# Patient Record
Sex: Female | Born: 1950 | Race: White | Hispanic: No | Marital: Married | Smoking: Former smoker
Health system: Southern US, Community
[De-identification: ages and names within clinical notes are randomized; demographics above are authoritative.]

## PROBLEM LIST (undated history)

## (undated) DIAGNOSIS — I779 Disorder of arteries and arterioles, unspecified: Secondary | ICD-10-CM

## (undated) DIAGNOSIS — K589 Irritable bowel syndrome without diarrhea: Secondary | ICD-10-CM

## (undated) DIAGNOSIS — T4145XA Adverse effect of unspecified anesthetic, initial encounter: Secondary | ICD-10-CM

## (undated) DIAGNOSIS — R159 Full incontinence of feces: Secondary | ICD-10-CM

## (undated) DIAGNOSIS — Z87828 Personal history of other (healed) physical injury and trauma: Secondary | ICD-10-CM

## (undated) DIAGNOSIS — I4510 Unspecified right bundle-branch block: Secondary | ICD-10-CM

## (undated) DIAGNOSIS — M1990 Unspecified osteoarthritis, unspecified site: Secondary | ICD-10-CM

## (undated) DIAGNOSIS — I471 Supraventricular tachycardia, unspecified: Secondary | ICD-10-CM

## (undated) DIAGNOSIS — E785 Hyperlipidemia, unspecified: Secondary | ICD-10-CM

## (undated) DIAGNOSIS — R7303 Prediabetes: Secondary | ICD-10-CM

## (undated) DIAGNOSIS — M5137 Other intervertebral disc degeneration, lumbosacral region: Secondary | ICD-10-CM

## (undated) DIAGNOSIS — F419 Anxiety disorder, unspecified: Secondary | ICD-10-CM

## (undated) DIAGNOSIS — G8929 Other chronic pain: Secondary | ICD-10-CM

## (undated) DIAGNOSIS — Z789 Other specified health status: Secondary | ICD-10-CM

## (undated) DIAGNOSIS — R112 Nausea with vomiting, unspecified: Secondary | ICD-10-CM

## (undated) DIAGNOSIS — Z87898 Personal history of other specified conditions: Secondary | ICD-10-CM

## (undated) DIAGNOSIS — Z8709 Personal history of other diseases of the respiratory system: Secondary | ICD-10-CM

## (undated) DIAGNOSIS — T8859XA Other complications of anesthesia, initial encounter: Secondary | ICD-10-CM

## (undated) DIAGNOSIS — J439 Emphysema, unspecified: Secondary | ICD-10-CM

## (undated) DIAGNOSIS — R011 Cardiac murmur, unspecified: Secondary | ICD-10-CM

## (undated) DIAGNOSIS — M549 Dorsalgia, unspecified: Secondary | ICD-10-CM

## (undated) DIAGNOSIS — I341 Nonrheumatic mitral (valve) prolapse: Secondary | ICD-10-CM

## (undated) DIAGNOSIS — M5030 Other cervical disc degeneration, unspecified cervical region: Secondary | ICD-10-CM

## (undated) DIAGNOSIS — Z9889 Other specified postprocedural states: Secondary | ICD-10-CM

## (undated) DIAGNOSIS — I739 Peripheral vascular disease, unspecified: Secondary | ICD-10-CM

## (undated) DIAGNOSIS — K56609 Unspecified intestinal obstruction, unspecified as to partial versus complete obstruction: Secondary | ICD-10-CM

## (undated) DIAGNOSIS — M51379 Other intervertebral disc degeneration, lumbosacral region without mention of lumbar back pain or lower extremity pain: Secondary | ICD-10-CM

## (undated) DIAGNOSIS — F329 Major depressive disorder, single episode, unspecified: Secondary | ICD-10-CM

## (undated) DIAGNOSIS — Z9189 Other specified personal risk factors, not elsewhere classified: Secondary | ICD-10-CM

## (undated) HISTORY — PX: LUMBAR LAMINECTOMY/ DECOMPRESSION WITH MET-RX: SHX5959

## (undated) HISTORY — PX: TUBAL LIGATION: SHX77

## (undated) HISTORY — PX: TRANSTHORACIC ECHOCARDIOGRAM: SHX275

## (undated) HISTORY — PX: ANTERIOR CERVICAL DECOMP/DISCECTOMY FUSION: SHX1161

## (undated) HISTORY — DX: Supraventricular tachycardia: I47.1

## (undated) HISTORY — PX: OTHER SURGICAL HISTORY: SHX169

## (undated) HISTORY — PX: TONSILLECTOMY: SUR1361

## (undated) HISTORY — PX: KNEE SURGERY: SHX244

## (undated) HISTORY — PX: POSTERIOR CERVICAL FUSION/FORAMINOTOMY: SHX5038

## (undated) HISTORY — DX: Supraventricular tachycardia, unspecified: I47.10

## (undated) HISTORY — DX: Cardiac murmur, unspecified: R01.1

## (undated) HISTORY — PX: COLONOSCOPY: SHX174

## (undated) HISTORY — PX: FORAMINOTOMY 1 LEVEL: SHX5835

## (undated) HISTORY — PX: SPINE SURGERY: SHX786

## (undated) HISTORY — DX: Anxiety disorder, unspecified: F41.9

---

## 1974-04-27 HISTORY — PX: BREAST SURGERY: SHX581

## 1997-10-08 ENCOUNTER — Ambulatory Visit (HOSPITAL_COMMUNITY): Admission: RE | Admit: 1997-10-08 | Discharge: 1997-10-08 | Payer: Self-pay | Admitting: Obstetrics and Gynecology

## 1997-10-17 ENCOUNTER — Ambulatory Visit (HOSPITAL_COMMUNITY): Admission: RE | Admit: 1997-10-17 | Discharge: 1997-10-17 | Payer: Self-pay | Admitting: Specialist

## 1997-10-31 ENCOUNTER — Ambulatory Visit (HOSPITAL_COMMUNITY): Admission: RE | Admit: 1997-10-31 | Discharge: 1997-10-31 | Payer: Self-pay | Admitting: Specialist

## 1997-11-14 ENCOUNTER — Ambulatory Visit (HOSPITAL_COMMUNITY): Admission: RE | Admit: 1997-11-14 | Discharge: 1997-11-14 | Payer: Self-pay | Admitting: Specialist

## 1998-02-06 ENCOUNTER — Ambulatory Visit (HOSPITAL_BASED_OUTPATIENT_CLINIC_OR_DEPARTMENT_OTHER): Admission: RE | Admit: 1998-02-06 | Discharge: 1998-02-06 | Payer: Self-pay | Admitting: *Deleted

## 1998-05-23 ENCOUNTER — Encounter: Payer: Self-pay | Admitting: Specialist

## 1998-05-28 ENCOUNTER — Encounter: Payer: Self-pay | Admitting: Specialist

## 1998-05-28 ENCOUNTER — Ambulatory Visit (HOSPITAL_COMMUNITY): Admission: RE | Admit: 1998-05-28 | Discharge: 1998-05-29 | Payer: Self-pay | Admitting: Specialist

## 1998-12-26 ENCOUNTER — Encounter (INDEPENDENT_AMBULATORY_CARE_PROVIDER_SITE_OTHER): Payer: Self-pay | Admitting: Specialist

## 1998-12-26 ENCOUNTER — Other Ambulatory Visit: Admission: RE | Admit: 1998-12-26 | Discharge: 1998-12-26 | Payer: Self-pay | Admitting: Obstetrics and Gynecology

## 1999-08-31 ENCOUNTER — Observation Stay (HOSPITAL_COMMUNITY): Admission: EM | Admit: 1999-08-31 | Discharge: 1999-08-31 | Payer: Self-pay | Admitting: Internal Medicine

## 1999-10-27 ENCOUNTER — Encounter: Payer: Self-pay | Admitting: Specialist

## 1999-11-03 ENCOUNTER — Inpatient Hospital Stay (HOSPITAL_COMMUNITY): Admission: RE | Admit: 1999-11-03 | Discharge: 1999-11-05 | Payer: Self-pay | Admitting: Specialist

## 1999-11-03 ENCOUNTER — Encounter: Payer: Self-pay | Admitting: Specialist

## 2000-01-05 ENCOUNTER — Encounter: Payer: Self-pay | Admitting: Obstetrics and Gynecology

## 2000-01-05 ENCOUNTER — Encounter: Admission: RE | Admit: 2000-01-05 | Discharge: 2000-01-05 | Payer: Self-pay | Admitting: Obstetrics and Gynecology

## 2000-03-03 ENCOUNTER — Other Ambulatory Visit: Admission: RE | Admit: 2000-03-03 | Discharge: 2000-03-03 | Payer: Self-pay | Admitting: Obstetrics and Gynecology

## 2000-05-28 ENCOUNTER — Encounter: Payer: Self-pay | Admitting: Specialist

## 2000-05-28 ENCOUNTER — Inpatient Hospital Stay (HOSPITAL_COMMUNITY): Admission: RE | Admit: 2000-05-28 | Discharge: 2000-05-31 | Payer: Self-pay | Admitting: Specialist

## 2000-08-19 ENCOUNTER — Encounter: Admission: RE | Admit: 2000-08-19 | Discharge: 2000-08-19 | Payer: Self-pay | Admitting: Specialist

## 2000-08-19 ENCOUNTER — Encounter: Payer: Self-pay | Admitting: Specialist

## 2000-11-02 ENCOUNTER — Encounter: Payer: Self-pay | Admitting: Specialist

## 2000-11-02 ENCOUNTER — Encounter: Admission: RE | Admit: 2000-11-02 | Discharge: 2000-11-02 | Payer: Self-pay | Admitting: Specialist

## 2000-11-25 ENCOUNTER — Encounter: Payer: Self-pay | Admitting: Specialist

## 2000-12-08 ENCOUNTER — Inpatient Hospital Stay (HOSPITAL_COMMUNITY): Admission: RE | Admit: 2000-12-08 | Discharge: 2000-12-12 | Payer: Self-pay | Admitting: Specialist

## 2000-12-08 ENCOUNTER — Encounter: Payer: Self-pay | Admitting: Specialist

## 2001-02-16 ENCOUNTER — Encounter: Payer: Self-pay | Admitting: Specialist

## 2001-02-16 ENCOUNTER — Encounter: Admission: RE | Admit: 2001-02-16 | Discharge: 2001-02-16 | Payer: Self-pay | Admitting: Specialist

## 2001-03-01 ENCOUNTER — Other Ambulatory Visit: Admission: RE | Admit: 2001-03-01 | Discharge: 2001-03-01 | Payer: Self-pay | Admitting: Obstetrics and Gynecology

## 2001-04-12 ENCOUNTER — Encounter: Admission: RE | Admit: 2001-04-12 | Discharge: 2001-04-12 | Payer: Self-pay | Admitting: Specialist

## 2001-04-12 ENCOUNTER — Encounter: Payer: Self-pay | Admitting: Specialist

## 2001-07-01 ENCOUNTER — Encounter: Admission: RE | Admit: 2001-07-01 | Discharge: 2001-07-01 | Payer: Self-pay | Admitting: Internal Medicine

## 2001-07-01 ENCOUNTER — Encounter: Payer: Self-pay | Admitting: Internal Medicine

## 2001-07-18 ENCOUNTER — Encounter: Admission: RE | Admit: 2001-07-18 | Discharge: 2001-10-16 | Payer: Self-pay | Admitting: Internal Medicine

## 2001-11-22 ENCOUNTER — Encounter: Admission: RE | Admit: 2001-11-22 | Discharge: 2002-02-20 | Payer: Self-pay | Admitting: Internal Medicine

## 2002-08-23 ENCOUNTER — Emergency Department (HOSPITAL_COMMUNITY): Admission: AD | Admit: 2002-08-23 | Discharge: 2002-08-23 | Payer: Self-pay | Admitting: Emergency Medicine

## 2002-10-10 ENCOUNTER — Encounter: Payer: Self-pay | Admitting: Gastroenterology

## 2002-10-23 ENCOUNTER — Encounter: Payer: Self-pay | Admitting: Orthopedic Surgery

## 2002-10-26 ENCOUNTER — Ambulatory Visit (HOSPITAL_COMMUNITY): Admission: RE | Admit: 2002-10-26 | Discharge: 2002-10-27 | Payer: Self-pay | Admitting: Orthopedic Surgery

## 2002-11-22 ENCOUNTER — Encounter: Payer: Self-pay | Admitting: Gastroenterology

## 2003-02-08 ENCOUNTER — Ambulatory Visit (HOSPITAL_COMMUNITY): Admission: RE | Admit: 2003-02-08 | Discharge: 2003-02-08 | Payer: Self-pay | Admitting: Orthopedic Surgery

## 2003-02-08 ENCOUNTER — Ambulatory Visit (HOSPITAL_BASED_OUTPATIENT_CLINIC_OR_DEPARTMENT_OTHER): Admission: RE | Admit: 2003-02-08 | Discharge: 2003-02-09 | Payer: Self-pay | Admitting: Orthopedic Surgery

## 2004-02-07 ENCOUNTER — Encounter: Admission: RE | Admit: 2004-02-07 | Discharge: 2004-02-07 | Payer: Self-pay | Admitting: Obstetrics and Gynecology

## 2005-02-09 ENCOUNTER — Inpatient Hospital Stay (HOSPITAL_COMMUNITY): Admission: RE | Admit: 2005-02-09 | Discharge: 2005-02-15 | Payer: Self-pay | Admitting: Specialist

## 2005-03-26 ENCOUNTER — Encounter: Admission: RE | Admit: 2005-03-26 | Discharge: 2005-03-26 | Payer: Self-pay | Admitting: Specialist

## 2005-04-07 ENCOUNTER — Encounter: Admission: RE | Admit: 2005-04-07 | Discharge: 2005-04-07 | Payer: Self-pay | Admitting: Specialist

## 2005-12-01 ENCOUNTER — Encounter: Admission: RE | Admit: 2005-12-01 | Discharge: 2005-12-01 | Payer: Self-pay | Admitting: Obstetrics and Gynecology

## 2006-09-24 ENCOUNTER — Ambulatory Visit (HOSPITAL_COMMUNITY): Admission: RE | Admit: 2006-09-24 | Discharge: 2006-09-24 | Payer: Self-pay | Admitting: Anesthesiology

## 2006-12-03 ENCOUNTER — Encounter: Admission: RE | Admit: 2006-12-03 | Discharge: 2006-12-03 | Payer: Self-pay | Admitting: Obstetrics and Gynecology

## 2007-01-24 ENCOUNTER — Inpatient Hospital Stay (HOSPITAL_COMMUNITY): Admission: EM | Admit: 2007-01-24 | Discharge: 2007-02-11 | Payer: Self-pay | Admitting: Emergency Medicine

## 2007-01-28 ENCOUNTER — Ambulatory Visit: Payer: Self-pay | Admitting: Physical Medicine & Rehabilitation

## 2007-02-09 DIAGNOSIS — Z87828 Personal history of other (healed) physical injury and trauma: Secondary | ICD-10-CM

## 2007-02-09 HISTORY — DX: Personal history of other (healed) physical injury and trauma: Z87.828

## 2007-02-11 ENCOUNTER — Inpatient Hospital Stay (HOSPITAL_COMMUNITY)
Admission: RE | Admit: 2007-02-11 | Discharge: 2007-02-26 | Payer: Self-pay | Admitting: Physical Medicine & Rehabilitation

## 2007-03-01 ENCOUNTER — Encounter
Admission: RE | Admit: 2007-03-01 | Discharge: 2007-04-27 | Payer: Self-pay | Admitting: Physical Medicine & Rehabilitation

## 2007-03-31 ENCOUNTER — Encounter
Admission: RE | Admit: 2007-03-31 | Discharge: 2007-04-01 | Payer: Self-pay | Admitting: Physical Medicine & Rehabilitation

## 2007-03-31 ENCOUNTER — Ambulatory Visit: Payer: Self-pay | Admitting: Physical Medicine & Rehabilitation

## 2007-05-03 ENCOUNTER — Encounter
Admission: RE | Admit: 2007-05-03 | Discharge: 2007-06-15 | Payer: Self-pay | Admitting: Physical Medicine & Rehabilitation

## 2007-05-31 ENCOUNTER — Encounter: Admission: RE | Admit: 2007-05-31 | Discharge: 2007-05-31 | Payer: Self-pay | Admitting: Neurosurgery

## 2008-01-24 ENCOUNTER — Encounter: Admission: RE | Admit: 2008-01-24 | Discharge: 2008-01-24 | Payer: Self-pay | Admitting: Obstetrics and Gynecology

## 2008-11-07 DIAGNOSIS — R198 Other specified symptoms and signs involving the digestive system and abdomen: Secondary | ICD-10-CM | POA: Insufficient documentation

## 2008-11-07 DIAGNOSIS — F329 Major depressive disorder, single episode, unspecified: Secondary | ICD-10-CM | POA: Insufficient documentation

## 2008-11-07 DIAGNOSIS — J449 Chronic obstructive pulmonary disease, unspecified: Secondary | ICD-10-CM | POA: Insufficient documentation

## 2008-11-07 DIAGNOSIS — K5900 Constipation, unspecified: Secondary | ICD-10-CM | POA: Insufficient documentation

## 2008-11-07 DIAGNOSIS — K589 Irritable bowel syndrome without diarrhea: Secondary | ICD-10-CM | POA: Insufficient documentation

## 2008-11-07 DIAGNOSIS — F3289 Other specified depressive episodes: Secondary | ICD-10-CM | POA: Insufficient documentation

## 2008-11-07 DIAGNOSIS — I059 Rheumatic mitral valve disease, unspecified: Secondary | ICD-10-CM | POA: Insufficient documentation

## 2008-11-09 ENCOUNTER — Ambulatory Visit: Payer: Self-pay | Admitting: Gastroenterology

## 2008-11-09 DIAGNOSIS — Z8719 Personal history of other diseases of the digestive system: Secondary | ICD-10-CM | POA: Insufficient documentation

## 2008-11-09 DIAGNOSIS — R143 Flatulence: Secondary | ICD-10-CM

## 2008-11-09 DIAGNOSIS — R141 Gas pain: Secondary | ICD-10-CM | POA: Insufficient documentation

## 2008-11-09 DIAGNOSIS — N390 Urinary tract infection, site not specified: Secondary | ICD-10-CM | POA: Insufficient documentation

## 2008-11-09 DIAGNOSIS — R142 Eructation: Secondary | ICD-10-CM

## 2008-11-09 LAB — CONVERTED CEMR LAB
ALT: 22 units/L (ref 0–35)
AST: 32 units/L (ref 0–37)
Albumin: 3.7 g/dL (ref 3.5–5.2)
Alkaline Phosphatase: 66 units/L (ref 39–117)
BUN: 16 mg/dL (ref 6–23)
Basophils Absolute: 0 10*3/uL (ref 0.0–0.1)
Basophils Relative: 0.9 % (ref 0.0–3.0)
Bilirubin, Direct: 0.1 mg/dL (ref 0.0–0.3)
CO2: 32 meq/L (ref 19–32)
Calcium: 9.4 mg/dL (ref 8.4–10.5)
Chloride: 104 meq/L (ref 96–112)
Creatinine, Ser: 0.7 mg/dL (ref 0.4–1.2)
Eosinophils Absolute: 0.1 10*3/uL (ref 0.0–0.7)
Eosinophils Relative: 2.6 % (ref 0.0–5.0)
Ferritin: 44.2 ng/mL (ref 10.0–291.0)
Folate: >20 ng/mL
GFR calc non Af Amer: 92.11 mL/min (ref >60)
Glucose, Bld: 74 mg/dL (ref 70–99)
HCT: 39.2 % (ref 36.0–46.0)
Hemoglobin: 13.3 g/dL (ref 12.0–15.0)
Iron: 79 ug/dL (ref 42–145)
Lymphocytes Relative: 33.6 % (ref 12.0–46.0)
Lymphs Abs: 1.5 10*3/uL (ref 0.7–4.0)
MCHC: 33.9 g/dL (ref 30.0–36.0)
MCV: 97.8 fL (ref 78.0–100.0)
Monocytes Absolute: 0.5 10*3/uL (ref 0.1–1.0)
Monocytes Relative: 11.6 % (ref 3.0–12.0)
Neutro Abs: 2.5 10*3/uL (ref 1.4–7.7)
Neutrophils Relative %: 51.3 % (ref 43.0–77.0)
Platelets: 202 10*3/uL (ref 150.0–400.0)
Potassium: 4.1 meq/L (ref 3.5–5.1)
RBC: 4 M/uL (ref 3.87–5.11)
RDW: 11.9 % (ref 11.5–14.6)
Saturation Ratios: 23.4 % (ref 20.0–50.0)
Sed Rate: 16 mm/hr (ref 0–22)
Sodium: 141 meq/L (ref 135–145)
TSH: 0.57 microintl units/mL (ref 0.35–5.50)
Tissue Transglutaminase Ab, IgA: 0.1 units (ref <7)
Total Bilirubin: 0.7 mg/dL (ref 0.3–1.2)
Total Protein: 6.2 g/dL (ref 6.0–8.3)
Transferrin: 241.5 mg/dL (ref 212.0–360.0)
Vitamin B-12: 788 pg/mL (ref 211–911)
WBC: 4.6 10*3/uL (ref 4.5–10.5)

## 2008-12-05 ENCOUNTER — Ambulatory Visit: Payer: Self-pay | Admitting: Gastroenterology

## 2009-03-15 ENCOUNTER — Encounter: Admission: RE | Admit: 2009-03-15 | Discharge: 2009-03-15 | Payer: Self-pay | Admitting: Obstetrics and Gynecology

## 2009-04-08 ENCOUNTER — Telehealth (INDEPENDENT_AMBULATORY_CARE_PROVIDER_SITE_OTHER): Payer: Self-pay | Admitting: *Deleted

## 2010-04-29 ENCOUNTER — Encounter
Admission: RE | Admit: 2010-04-29 | Discharge: 2010-04-29 | Payer: Self-pay | Source: Home / Self Care | Attending: Obstetrics and Gynecology | Admitting: Obstetrics and Gynecology

## 2010-05-18 ENCOUNTER — Encounter: Payer: Self-pay | Admitting: Neurosurgery

## 2010-09-09 NOTE — Discharge Summary (Signed)
NAMEGORDIE, BELVIN                ACCOUNT NO.:  [id number]   MEDICAL RECORD NO.:  [id number]          PATIENT TYPE:  IPS   LOCATION:  4028                         FACILITY:  [facility]   PHYSICIAN:  Ranelle Oyster, M.D.DATE OF BIRTH:  [birth year]   DATE OF ADMISSION:  02/11/2007  DATE OF DISCHARGE:  02/26/2007                               DISCHARGE SUMMARY   DISCHARGE DIAGNOSES:  1. Spinal cord injury, central cord syndrome.  2. Chronic pain control issues.  3. History of depression.  4. Urinary retention, resolved.  5. History of supraventricular tachycardia.  6. Biceps tendinitis.   HISTORY OF PRESENT ILLNESS:  Ms. Bhagat is a 60 year old female with  history of hypertension, multiple cervical and lumbar surgeries in the  past who fell down some stairs with complaints of neck pain and was seen  at outside hospital and DC'd to home.  She fell again later that day  with left arm and left leg weakness.  CT at [facility] just  showed C4-C5 subluxation.  The patient was placed in cervical collar and  transferred to [facility] on December 24, 2006 for treatment.  MRI of C-spine showed C4-C5 fracture subluxation with pseudoarthrosis  with increase in signal intensity.  The patient underwent C2 to C6  decompression on February 09, 2007 by Dr. Lovell Sheehan.  Currently continues  with cervical collar.  She noted to continue with deficits with left-  sided motor with loss of balance.  Continues to be impaired in self care  secondary to left upper extremity weakness.  Rehab consult for further  therapies.   PAST MEDICAL HISTORY:  1. Significant for chronic low back pain and pelvic pain.  2. Left thumb repair.  3. Multiple cervical and lumbar decompressions and history of mitral      valve prolapse, history of SVT, bilateral ___________ and repair of      chronic  rectum insufficiency.   ALLERGIES:  MORPHINE, SULFATE, NAPROSYN, METHADONE, CELEBREX, AND  NEURONTIN.   FAMILY  HISTORY:  Positive for diabetes and coronary artery disease.   SOCIAL HISTORY:  The patient is married and lives in [place].  Was  independent, however, has been disabled secondary to pain issues.  Smokes a half-pack per day for the past 30 years.  Does not use any  alcohol.   HOSPITAL COURSE:  Ms. Nahomi Hegner was admitted to rehab on February 11, 2007 for inpatient therapies to consist of PT, OT daily.  Past  admission, the patient's blood pressures were monitored on b.i.d. basis.  Her blood pressure tended to be on the low side.  However, the patient  has been asymptomatic.  Tenormin has been continued throughout this stay  with blood pressures ranging from mid 80s to occasional high in the 110s  systolics, 50s to 60s diastolic.  Last weight is at 43 kg.  The patient  was noted to have issues with urinary retention during this stay.  PVRs  checks showed bladder volumes up to 450 mL and the patient had required  in-and-out caths during this stay.  She was started on  Urecholine to  help with bladder functioning and continues on this at time of  discharge.  The patient will have a slow taper off Urecholine over the  next 4 weeks past discharge.  Pain control has been an issue.  She was  started on Duragesic patch as well as Levaquin for neuropathy with  p.r.n. oxycodone and this has helped her overall symptomatology.  The  patient has had complaints of bilateral shoulder pain secondary to  biceps tendinitis.  Shoulders were injected on February 15, 2007 with  improvement in symptomatology.  Labs done past admission revealed a  hemoglobin 11.1, hematocrit 33.4, white count 10.1, platelets 401.  Check of electrolytes revealed sodium 139, potassium 3.8, chloride 98,  CO2 31, BUN 15, creatinine 2.66, glucose 88.   The patient's overall anxiety levels were much improved during this  stay.  Mobility has been slowly improving with patient showing  improvement in the left lower extremity by  time of discharge.  Left  upper extremity continues to be impaired with shoulder movement at 3 to  4 out of 5.  She continues to have decrease in coordination with  problems with initiation of left upper extremity.  OT has been working  with the patient regarding self-care needs, and she has exceeded her  goal to being at supervision to occasional independent level for self  care.  OT is also focused on new upper extremity group exercises to  address truncal control as well as functional reaching, grasping, and  release of left upper extremity and finger movement activities as well  as focusing on safety, dynamic standing balance, and functional  endurance.  Physical Therapy has been working with patient for mobility.  The patient is currently was at modified independent to supervision  level using a cane as assistive device for all mobility.  She does  continue to have challenges secondary to balance safety as well as  problems with safety awareness and problems with energy conservation  with activity.  She is modified independent in supervised setting with  the use of  cane.  Twenty-four-hour supervision is recommended past  discharge and friend is to provide this.  She will continue with further  follow-up outpatient PT, OT at [facility] beginning  March 01, 2007.  On February 26, 2007, the patient is discharged to  home.   DISCHARGE MEDICATIONS:  1. Senokot-S 2 p.o. nightly.  2. Paxil 30 mg a day.  3. Klonopin 1 mg nightly.  4. Lipitor 20 mg nightly.  5. Tenormin 50 mg half p.o. b.i.d.  6. Lyrica 50 mg q.8 hours,  7. _______ #16 given.  8. Duragesic patch 12 mcg at night, change q.3 days, 1 box Rx.  9. Flexeril 10 mg nightly p.r.n.  10..  Oxycodone 5 mg 1 to 2 q.6 to 8 hours p.r.n. pain, #60 Rx.  11..  Urecholine 25 mg q.i.d. for 1 week then decrease to t.i.d. for 1  week then b.i.d. x1 week, then once a day until gone.   Diet is regular.  Activity level is  24-hour supervision.  Wound care,  keep area clean and dry.  Neck collar at all times.   FOLLOWUP:  The patient is to follow up with Dr. Shary Decamp for routine check  in 2 weeks.  Follow up with Dr. Lovell Sheehan in 2 to 3 weeks for postop  check.  Follow up with Dr. Thyra Breed for pain management.  Follow up  with Dr. Riley Kill April 01, 2007 at 9:40.      Greg Cutter, P.A.      Ranelle Oyster, M.D.  Electronically Signed    PP/MEDQ  D:  03/11/2007  T:  03/12/2007  Job:  [id number]   cc:   Feliciana Rossetti, MD  Kathrin Penner. Vear Clock, M.D.

## 2010-09-09 NOTE — Op Note (Signed)
[name], [name]                ACCOUNT NO.:  [id number]   MEDICAL RECORD NO.:  [id number]          PATIENT TYPE:  INP   LOCATION:  3016                         FACILITY:  [facility]   PHYSICIAN:  Cristi Loron, M.D.DATE OF BIRTH:  [birth year]   DATE OF PROCEDURE:  02/09/2007  DATE OF DISCHARGE:                               OPERATIVE REPORT   BRIEF HISTORY:  The patient is a 60 year old white female who has  undergone 13 prior spine surgeries by Dr. Tanda Rockers and/or his prior  partners.  The patient has undergone anterior cervical diskectomy and  fusion plating from C3 down to T1 as well as the posterior cervical  fusion at C7-T1 sheath.  The patient took a fall and fell down some  steps and injured her neck.  She was worked up in [facility]  department with a CT scan and x-rays, which demonstrated the patient had  a fracture and subluxation at C4-C5 i.e. just below the C3-C4 plate  screws.  The patient suffered an incomplete spinal cord injury as a  result of this injury.  She was sent to the emergency department by Dr.  Tanda Rockers and Dr. Tanda Rockers requested that I evaluate and treat the patient.  I discussed the situation with the patient and her husband and I  recommended that we give her sometime to recover from her spinal cord  injury and that we in the future decompressed, instrumented, and  stabilized her spine.  I described the surgery with the patient  extensively.  I have answered all her questions.  We discussed the  risks, benefits, and alternatives of surgery and she decided to proceed  with that operation.   PREOPERATIVE DIAGNOSIS:  C4-C5 fracture with subluxation, spinal  stenosis, cervical myelopathy, radiculopathy, cervicalgia, and  incomplete spinal cord injury.   POSTOPERATIVE DIAGNOSIS:  C4-C5 fracture with subluxation, spinal  stenosis, cervical myelopathy, radiculopathy, cervicalgia, and  incomplete spinal cord injury.   PROCEDURE:  Decompressive  laminectomy at C3-C4 and C5-C6 with a partial  laminectomy at C2 to decompress the patient's spinal cord and nerves  using microdissection; posterolateral arthrodesis C2-C3, C3-C4, C4-C5,  and C5-C6 utilizing local morselized autograft bone and bone morphogenic  protein and VITOSS bone graft extender; posterior cervical  instrumentation C2-C6 with C2 laminar screws and subaxial lateral mass  screws.   SURGEON:  Cristi Loron, M.D.   ASSISTANT:  Payton Doughty, M.D.   ANESTHESIA:  General endotracheal.   ESTIMATED BLOOD LOSS:  200 mL.   SPECIMENS:  None.   DRAINS:  None.   COMPLICATIONS:  None.   PROCEDURE:  The patient was brought to the operating room by anesthesia  team.  Trachea was carefully induced via awake intubation by the  anesthesia team.  The patient was anesthetized on the Stryker's rotating  table.  The Gardner-Wells tongs were applied to the patient's calvarium,  and she was put in 5-pounds traction.  We then carefully turned the  patient to the prone position in Stryker table.  The patient's  suboccipital region was then shaved with clippers.  This region as well  as her neck and upper thorax were then prepared with Betadine scrub and  Betadine solution.  Sterile drapes were applied.  I then injected the  area to be incised with Marcaine with epinephrine solution.  I used a  scalpel to make a midline incision over the cervical thoracic junction.  I used electrocautery to perform a subperiosteal section exposing the  spinous process and lamina of C2 down to C7.  We obtained intraoperative  radiograph to confirm our location.  We inserted cerebellar retractor  for exposure.  We began decompression by incising the interspinous  ligament at C2-C3, C3-C4, C4-C5, C5-C6, and C6-C7 with scalpel.  We used  the Leksell rongeur to remove the spinous process of C3 down to C6.  We  shaved this bone and later cleared off soft tissue and used as local  autograft bone.  I  then used high-speed drill to thin out the lamina at  C3, C4, C5, and C6 until there was only a thin shell of bone.  I then  used the 2 mm Kerrison punches to complete the laminectomy at C3, C4,  C5, and C6.  I then used the Kerrison punch to remove the ligament  flavum at these levels as well as to remove the C2-C3 ligamentum flavum  and undercut the caudal end plate at C2 decompressing  the patient from  the caudle edge of C2 down to the upper edge of C7.  Because the patient  had profound weakness in her left deltoid, under the magnification and  illumination of the microscope, we performed a foraminotomy about the  left C5 nerve root decompressing the nerve root.   Having completing the decompression, we now turned our attention to the  instrumentation.  We could not use the fluoroscopy on the Stryker table.  We therefore used standard trajectories.  I then used electrocautery to  expose the lateral masses from C3 down to C7 as well as full expose the  C2 lamina.  I then used the awl to create pilot holes approximately  center of the lateral masses from C3 down to C6.  We then used the drill  and drill guide to drill 14-mm holes in the lateral masses using  standard cephalad and lateral trajectory.  We probed inside the drill  holes as well as cortical breeches.  There was no unexpected bleeding,  spinal fluid, etc. when drilling these holes.  We then placed a 14-mm  proximal head screws bilaterally from C3 down to C6.  We now turned our  attention to placement of the lamina screws at C2.  I used the awe to  create a pilot hole and then drilled a 24-mm hole in lamina.  We probed  inside these drill holes to rule out cortical breeches.  I then inserted  24 mm thread screws into the lamina bilaterally at C2 (the rationale  behind fusing C2-C3 was that the patient had significant stenosis at C2-  C3 and a fusion from C3 below.  I felt that she would quickly develop  worsening stenosis at  c2/3 if we did not fuse it as well).  We then  contoured a rod to connect unilateral screws from C2 down C6.  We  secured the rod in place with the caps, which tightened appropriately.  We did this bilaterally.  This completed the instrumentation.   We now turned our attention to the posterolateral arthrodesis.  I used a  high-speed drill to decorticate some of the C2  lamina, the C2-C3 facet,  the C3, C4, C5 and C6 lateral masses as well as the facets at these  levels.  We then laid a bone morphogenic protein soaked collagen sponge  over these corticated structures bilaterally, and then laid VITOSS bone  graft extender as well as local autograft bone over BMP sponges  completing the posterolateral arthrodesis.  We then obtained hemostasis  using bipolar electrocautery.  We then removed the cerebellar retractor  and then reapproximated the patient's cervical thoracic fascia with  interrupted #1 Vicryl suture, subcutaneous tissues with interrupted 2-0  Vicryl suture, and the skin with Steri-Strips and Benzoin.  The wound  was then coated with bacitracin ointment.  A sterile dressing was  applied.  The drapes were removed.  The patient was subsequently  sandwiched on the Stryker table, returned to supine position, and then  extubated by the anesthesia team and transported to post anesthesia care  unit in stable condition.  All sponge, instrument, and needle counts  correct at the end of the case.      Cristi Loron, M.D.  Electronically Signed    JDJ/MEDQ  D:  02/09/2007  T:  02/10/2007  Job:  [id number]

## 2010-09-09 NOTE — Assessment & Plan Note (Signed)
Mrs. Kartes is back regarding her central cord injury.  She is doing  quite well.  She has been discharged from PT, but still working on OT  with upper extremity activities.  She has followed up with Dr. Lovell Sheehan  who gave her a good report and she sees Dr. Vear Clock still for chronic  pain management.  The patient has occasional low back pain, but this is  minimal.  She complains of ongoing numbness and tingling in the hands  and feet.  The hands have been a problem prior to the injury.  She has  had more numbness and tingling on the right than the left side.  She has  been moving her bowel and bladder well.  She is independent with lower  ADLs.  She wants to return to driving.   Full medication list is in the chart.  The patient remains on Paxil,  Lyrica, clonazepam, Vicodin and Colace.   Review of systems notable for numbness, tingling, spasms, depression,  constipation, poor appetite.  Other pertinent positives listed above.  Full review is in the written health history section of the chart.   SOCIAL HISTORY:  The patient is married living with her spouse who is  supportive.   PHYSICAL EXAMINATION:  VITAL SIGNS:  Blood pressure 107/67, pulse 86,  respiratory rate 18, she is saturating 97% on room air.  GENERAL APPEARANCE:  The patient is pleasant, alert and oriented x3.  She walked without assistive device today and did very nicely.  She is  able to walk heel to toe without issues.  She had decreased sensation  1/2 in bilateral upper and lower extremities, right more affected than  left.  Reflexes are 1+ upper and lower.  She had 4/5 strength left  shoulder, 4+ to 5 right shoulder, 4+ to 5/5 throughout the upper  extremities today.  Lower extremity strength is grossly 5/5.  Fine motor  coordination was fair in both upper and lower extremities today.  Cognitively she is appropriate.  Cranial nerve exam is intact.   ASSESSMENT:  1. Cervical central cord syndrome.  2. History of  depression and chronic pain.  3. History of supraventricular tachycardia.   PLAN:  1. The patient has done extremely well to this point.  I gave her      permission to drive after a couple of trial attempts with her      husband.  She should not have any major issues in my opinion due to      the significant improvement in her upper extremity use.  2. Continue with OT until completion.  3. Pain followup and management per Dr. Vear Clock.      Ranelle Oyster, M.D.  Electronically Signed     ZTS/MedQ  D:  04/01/2007 10:27:37  T:  04/01/2007 12:42:12  Job #:  [id number]   cc:   Cristi Loron, M.D.  Fax: [phone]   Mark L. Vear Clock, M.D.  Fax: [phone]

## 2010-09-09 NOTE — Discharge Summary (Signed)
[name], Sara                ACCOUNT NO.:  [id number]   MEDICAL RECORD NO.:  [id number]          PATIENT TYPE:  IPS   LOCATION:  4028                         FACILITY:  [facility]   PHYSICIAN:  Ellwood Dense, M.D.   DATE OF BIRTH:  1951-02-14   DATE OF ADMISSION:  02/11/2007  DATE OF DISCHARGE:                               DISCHARGE SUMMARY   ATTENDING PHYSICIAN:  Ranelle Oyster, M.D.   PRIMARY CARE PHYSICIAN:  Feliciana Rossetti, MD.  Cardiologist, Dr. Tresa Endo and  pain management, Dr. Thyra Breed.   HISTORY OF PRESENT ILLNESS:  [name] is a 60 year old Caucasian  female with history of hypertension and multiple cervical and lumbar  surgeries in the past.  She apparently fell down stairs at approximately  2:00 in the morning and had complaints of neck pain and was seen at an  outside hospital with discharge soon after.  The patient reportedly went  home and fell again later that day and was brought in with left arm and  leg weakness.  The CT of the neck was done secondary to right  hemiparesis.  She was found to have C4-C5 subluxation.  She was  initially placed in a cervical collar and transferred to [facility] as of January 24, 2007.  Cervical MRI scan here she showed C4-  C5 fracture with subluxation versus pseudoarthrosis with increased  signal intensity.  She was continued in a cervical collar and  subsequently underwent C2-C6 decompression surgery on February 09, 2007,  performed by Dr. Lovell Sheehan.   Postoperatively, she continues with mild weakness in the left arm and  leg with problems with motor control and loss of balance.  She has had  some deficits in ADLs and is weak in the left upper extremity compared  to the right.  Pain issues have been persistent.  The patient was evaluated by the rehabilitation physicians and thought  to be an appropriate candidate for inpatient rehabilitation.   REVIEW OF SYSTEMS:  Positive for lumbago, numbness, weakness,  depression, cervicalgia and wound healing.   PAST MEDICAL HISTORY:  1. History of chronic low back and pelvic pain.  2. History of left thumb repair after laceration C7-T1 decompression      in July 2001 with approximately six prior cervical surgeries.  3. An L2-L3 decompressive surgery in October 2006 with approximately      seven prior lumbar surgeries.  4. History of mitral valve prolapse.  5. History of supraventricular tachycardia.  6. Chronic obstructive pulmonary disease.  7. Depression.  8. Repair of chronic ulnar ligament displacement.  9. Right thumb surgery January 2004.  10.Bilateral hip osteoarthritis.   FAMILY HISTORY:  Positive for coronary artery disease and diabetes  mellitus.   SOCIAL HISTORY:  The patient is married. She lives in [place] with her  husband and two dogs.  She is on disability.  She smokes approximately 5-  10 cigarettes per day for 30 years.  She denies alcohol usage.  She  lives in a split-level home with the bathroom upstairs with no 24-hour  assistance available.   Functional  history prior to admission:  Independent.   ALLERGIES:  MORPHINE SULFATE, NAPROXEN, METHADONE, CELEBREX, NEURONTIN.   MEDICATIONS PRIOR TO ADMISSION:  1. Verapamil 240 mg.  2. Keflex.  3. Paxil 30 mg.  4. Atenolol 50 mg 1/2 tablet.  5. Klonopin 1 mg daily.  6. Hydrocodone 10/325 p.r.n.  7. Lipitor 20 mg daily.  8. Aspirin 325 mg daily.  9. Colace q.h.s.  10.Calcium gluconate daily.   LABORATORY:  Recent hemoglobin was 10.6 with hematocrit 31.8, platelet  count 384,000, white count of 7.1.  Recent sodium 143, potassium 4.3,  chloride 105, bicarbonate 31, BUN 4, creatinine 0.74, glucose of 107.  Chest x-ray February 08, 2007 showed near complete resolution of left mid  lung pneumonia with suggestion of COPD.   PHYSICAL EXAMINATION:  GENERAL APPEARANCE:  A reasonably, well-appearing  middle-aged, adult  female lying in bed in mild to moderate acute  discomfort  with cervical collar in place.  VITAL SIGNS:  Blood pressure 113/63 with pulse 74, respiratory rate 20  and temperature 98.3.  HEENT: Normocephalic, nontraumatic.  NECK:  Examination revealed a well-healing wound with a hard cervical  collar in place.  CARDIOVASCULAR:  Regular rate and rhythm, S1-S2 without murmurs.  ABDOMEN:  Soft, nontender with positive bowel sounds.  LUNGS:  Clear to auscultation bilaterally.  NEUROLOGIC:  Alert, oriented x3.  Cranial nerves II-XII were grossly  intact.  Bilateral upper extremity exam showed 4+/5 strength in the  right upper extremity and 4-/5 strength in the left upper extremity.  Bulk and tone were normal.  Reflexes were 2+ and symmetrical.  Lower  extremity a.m. showed 4+/5 strength in hip flexion and extension of the  ankle and dorsiflexion bilaterally.   IMPRESSION:  Status post fall with incomplete spinal cord injury with C4-  C5 fracture and subluxation, status post decompressive surgery February 09, 2007 with Dr. Lovell Sheehan.   Presently the patient has deficits in ADLs, transfers, and ambulation  related to the above noted cervical fracture with subsequent  decompression.   PLAN:  1. Admit to the rehabilitation area for daily therapies, to include      physical therapy for range of motion, strengthening, bed mobility,      transfers, pre-gait training, gait training and equipment      evaluation.  2. Occupational therapy for range of motion, strengthening, ADLs,      cognitive/perceptional training, splinting and equipment      evaluation.  3. Evaluation for skin care, wound care, bound bladder training as      necessary.  4. Case management to assess home environment, assist with discharge      planning. Arrange for appropriate follow-up care.  5. Social work to assess family and social support and assist in      discharge planning.  6. Continue regular diet.  7. Check admission labs including CBC and CMED in a.m. February 14, 2007.  8. Clean neck wound with normal saline with dry dressing daily.  9. Oxycodone 5 mg 1- 2 tablets p.o. q.4h. p.r.n. for pain.  10.OxyContin CR 20 mg p.o. q.12h.  11.Lovenox 40 mg subcu daily for DVT prophylaxis.  12.Lyrica 50 mg p.o. b.i.d.  13.Hydrocortisone cream 1% to the rash p.r.n. daily.  14.Flexeril 10 mg p.o. t.i.d. p.r.n. muscle spasms and aches.  15.Senokot-S 2 tablets p.o. q.h.s.  16.Verapamil sustained release 240 mg p.o. q.h.s.  17.Cervical collar at all times.  18.Multivitamin one p.o. daily.  19.Lexapro 420  mg p.o. q.h.s. (dispense as written).  20.Klonopin 1 mg p.o. q.h.s.  21.Tenormin 25 mg p.o. b.i.d.  22.Paxil 30 mg p.o. daily.   PROGNOSIS:  Fair/good.   ESTIMATED LENGTH OF STAY:  Four to  7 days.   GOALS:  Modified independent, ADLs, transfers and ambulation.           ______________________________  Ellwood Dense, M.D.     DC/MEDQ  D:  02/11/2007  T:  02/13/2007  Job:  [id number]

## 2010-09-09 NOTE — Consult Note (Signed)
[name], [name]                ACCOUNT NO.:  [id number]   MEDICAL RECORD NO.:  [id number]          PATIENT TYPE:  INP   LOCATION:  2109                         FACILITY:  [facility]   PHYSICIAN:  Cristi Loron, M.D.DATE OF BIRTH:  1951/02/12   DATE OF CONSULTATION:  DATE OF DISCHARGE:                                 CONSULTATION   CHIEF COMPLAINT:  Neck pain.   HISTORY OF PRESENT ILLNESS:  The patient is a 60 year old white female  who has had a lot of spine problems.  She has undergone 7 lumbar  surgeries as well as 6 cervical surgeries done by Dr. Jacqulyn Bath initially and  most recently by Dr. Otelia Sergeant.  The patient's last cervical surgery was  back in 2003 when she underwent a posterior cervical instrumentation and  fusion at C7-T1; it was on December 08, 2000.  The patient has done well  over the last several years until yesterday.  The patient tells me that  they recently changed the master bedroom to another room.  The patient  got up at night around 0200 hours and fell down some steps and put my  head through sheet rock.  There was no loss of consciousness.  EMS was  called and the patient was taken to [facility] where a cervical  CT was obtained and did not appear to demonstrate any acute finding.  The patient was treated for neck pain and released.  Later that day,  i.e., at approximately 1000 hours, the patient was sitting on the  commode and she bent forward and suddenly her left arm and leg went  flaccid.  She fell to the floor from the commode and EMS was again  called.  She was taken to the [facility].  At that time, a  cervical CT was obtained.  This was followed by flexion-extension and C-  spine x-rays, which demonstrated subluxation at C4-C5.  The patient was  placed in a cervical collar and Dr. Annell Greening was called who was on  call for Dr. Otelia Sergeant.  Dr. Ophelia Charter recommended the patient follow up with  Dr. Otelia Sergeant first thing this morning.  Dr. Otelia Sergeant saw the  patient and  called EMS and had her transferred to the [facility] Emergency  Department and it was requested that I see the patient.   Presently, the patient is accompanied by her husband.  She complaints of  neck pain.  She tells me that her left arm and leg strength has  improved, but she still feels weak.  She has numbness and tingling in  her entire left arm and hand and there is numbness in the right ulnar  aspect or C8 aspect of her right upper extremity.  She has noticed  spontaneous clonus in left greater than right lower extremities.   PAST MEDICAL HISTORY:  Positive for mitral valve prolapse,  supraventricular tachycardia, knee osteoarthritis, ruptured cervical and  lumbar disk, and depression.  She sees Dr. Thyra Breed for chronic  pain management.   PAST SURGICAL HISTORY:  L5-S1 laminectomy and diskectomy in April 1983;  another L5-S1  laminectomy and diskectomy, December 1983; L5-S1  diskectomy March 1985; L4-L5 fusion in May 1990; C6-C7 anterior cervical  diskectomy in May of 1993; C4-C5 and C5-C6 anterior cervical diskectomy  fusion in December 1995; L3-L4 laminectomy with extensive fusion in June  1997; L3-L4 redo fusion diskectomy etc., April 1998; left C6-C7  foraminotomy February 2000; C7-T1 foraminotomy July 2001; C3-C4 and C7-  T1 anterior cervical diskectomy fusion, February 2002; C7-T1 posterior  fusion with bone graft and wire, August 2002; and extension of fusion up  to L2-L3, October 2006.   PRESENT MEDICATIONS:  1. Verapamil 240 mg p.o. daily.  2. Atenolol 25 mg p.o. b.i.d.  3. Paxil 30 mg p.o. daily.  4. Baclofen 20 mg p.o. t.i.d.  5. Clonazepam 1 p.o. at bedtime.  6. Hydrocodone p.r.n.  7. Lipitor 20 mg p.o. daily.  8. Aspirin 325 mg p.o. daily.  9. Colace stool softener daily.   FAMILY MEDICAL HISTORY:  Noncontributory.   SOCIAL HISTORY:  The patient is married.  She has 1 stepson.  She lives  in [place] and she is disabled.  She  smokes one-half pack per day of  cigarettes x30 years.  I advised her to quit.  She denies ethanol or  drug use.   REVIEW OF SYSTEMS:  Negative except as above.   PHYSICAL EXAMINATION:  GENERAL:  A thin pleasant 60 year old white  female, in a cervical orthosis complaining of neck pain.  HEENT:  Normocephalic and atraumatic.  Pupils are equal, round, and  reactive to light.  Extraocular muscles are intact.  Oropharynx is  benign.  NECK:  There is no obvious deformity.  She has an orthosis in place.  No  tracheal deviation or jugular venous distention.  THORAX:  Symmetric.  LUNGS:  Clear to auscultation.  HEART:  Regular rhythm.  ABDOMEN:  Soft and nontender.  EXTREMITIES:  No obvious deformities.  BACK:  No palpable deformities or point tenderness.  NEUROLOGIC:  The patient is alert and oriented x3.  Glasgow coma scale  is 15.  Cranial nerves II through XII are examined and bilaterally  grossly normal.  Vision and hearing are grossly normal bilaterally.  Motor strength is as follows:  The patient has 4+/5 strength in her  bilateral deltoid, handgrip, psoas, quadriceps, gastrocnemius, and  extensor hallucis longus.  She has weakness in her right triceps at 4/5  and left handgrip and wrist extensor are weak at 4/5.  Cerebellar exam  demonstrates some difficulty with rapid alternating movements of the  upper extremities.  Sensory functions demonstrate decreased light  sensation in her left entire upper extremity and the ulnar aspect of her  right hand.  Deep tendon reflexes are 2+/4 in bilateral biceps and  triceps, 3-4/4 in her bilateral quadriceps, and 2/4 in her  gastrocnemius.  The patient has sustained ankle clonus on the left and  nonsustained ankle clonus on the right.  She has spontaneous clonus and  spasticity.   IMAGING STUDIES:  I reviewed the patient's cervical spine x-ray  performed at [facility], demonstrates that the patient has  anterior cervical plating at  C3-C4.  She has a bony fusion at C4-C5, C5-  C6, C6-C7, and C7-T1.  The patient has posterior instrumentation at C7-  T1 with flexion.  The patient has subluxation of C4-C5 indicative of a  pseudoarthrosis at C4-C5 or possibly a fracture through the arthrodesis.   I also reviewed the patient's cervical MRI performed without contrast at  [facility] and it  demonstrates quite a bit of motion and metal  artifact.  The sagittal views are the most useful images as the axial  images are near uninterpretable because of the artifact.  The patient  appears to have significant stenosis at C2-C3, C3-C4, and C4-C5.  She  has a bulging disk at C7-T1.  She appears to have findings consistent  with a pseudoarthrosis and/or a fracture through the prior arthrodesis  at C4-C5.  The patient has a diffusely swollen spinal cord as well as  high signal intensity within her spinal cord at C4-C5.   ASSESSMENT/PLAN:  C4-C5 fracture subluxation versus pseudoarthrosis and  cervical myelopathy.  I have discussed situation with the patient and  her husband apparently at patient's request.  I told her that she has  injured her spinal cord and she has instability at C4-C5.  I recommended  that we admit her for observation.  It is too late to give her Solu-  Medrol for spinal cord injury as the injury is greater than 8 hours old.  I have told her that she will likely need surgery, but I preferred to  let some of the swelling improve prior to doing that surgery for  increased safety and furthermore, she seems to be improving clinically.  I have briefly discussed with her that as she improves, we will likely  need to work up further with the cervical myelo-CT in order to get a  better idea just how much narrowing there is, but as above I prefer to  wait.  I think she is going to come to need a cervical laminectomy with  the posterior instrumentation and fusion at multiple levels.  I have  answered all the  questions.      Cristi Loron, M.D.  Electronically Signed     JDJ/MEDQ  D:  01/24/2007  T:  01/25/2007  Job:  [id number]   cc:   Feliciana Rossetti, MD

## 2010-09-09 NOTE — Discharge Summary (Signed)
[name], [name]                ACCOUNT NO.:  [id number]   MEDICAL RECORD NO.:  [id number]          PATIENT TYPE:  INP   LOCATION:  3016                         FACILITY:  [facility]   PHYSICIAN:  Cristi Loron, M.D.DATE OF BIRTH:  1951/02/25   DATE OF ADMISSION:  01/24/2007  DATE OF DISCHARGE:  02/11/2007                               DISCHARGE SUMMARY   BRIEF HISTORY:  The patient is a 60 year old white female who has  undergone multiple lumbar spine and cervical spine procedures by other  physicians.  She suffered a fall and suffered a fracture subluxation at  C4-C5, and Dr. Otelia Sergeant requested that I admit and care for this patient.   For further details of this admission, please refer to admission history  and physical.   HOSPITAL COURSE:  Admitted the patient to [facility] on  January 24, 2007, with diagnosis of C4-C5 fracture subluxation and  incomplete spinal cord injury.  As the patient's injury was greater than  8 hours prior to admission, we did not start her on the Solu-Medrol  protocol.  The patient's neurologic status was improving, and her scans  demonstrated she had significant stenosis and spinal cord injury, but  since she was improving I recommended we give it some time before  surgery, after there was some time for swelling to decrease.  I  described a posterior cervical decompression and instrumentation of  fusion with her.  I discussed the risks, benefits, and alternatives of  the surgery.  The patient consented for the surgery, and I performed a  C2-C6 posterior cervical decompression and fusion on February 09, 2007.  The surgery went well.  The patient's postoperative course is  unremarkable.  She did have some ongoing pain control issues and was  started on OxyContin.  PT/OT and PMR saw the patient, and they felt that  she was stable to be transferred to the inpatient rehab unit on February 11, 2007, and we made the transfer.   FINAL DIAGNOSES:  1. C4-C5 fracture subluxation.  2. Spinal cord injury.  3. Chronic obstructive airway disease.  4. Depression.  5. Mitral valve prolapse.  6. Osteoarthritis.   PROCEDURE PERFORMED:  C2-C6 cervical laminectomy and foraminotomies with  decompression of the spinal cord; posterolateral instrumentation, C3-C7,  with Medtronic titanium polyaxial screws; C2 laminar screws; posterior  cervical fusion, C2-C7, with local morselized autograft bone, and VITOSS  bone-graft extender as well as bone morphogenic protein.   DISCHARGE INSTRUCTIONS:  The patient was instructed to follow up with me  1 month after discharge from the rehab unit.     Cristi Loron, M.D.  Electronically Signed    JDJ/MEDQ  D:  03/09/2007  T:  03/09/2007  Job:  [id number]

## 2010-09-12 NOTE — Discharge Summary (Signed)
[place]  Patient:    [name], [name]                       MRN: [id number] Adm. Date:  [id number] Disc. Date: [id number] Attending:  Sypher, Douglass Rivers CC:         Katy Fitch. Sypher, Montez Hageman., M.D. (2)                           Discharge Summary  ADMITTING DIAGNOSIS:  Stab laceration, left thumb-index web space with arterial bleeding, rule out laceration of ulnar proper digital nerve of left thumb due to loss of sensibility in the ulnar aspect of left thumb pulp.  POSTOPERATIVE DIAGNOSIS:  Multiple arterial lacerations to branches of princeps pollicis artery with profound hemorrhage complicated by Celebrex intake for chronic pain.  OPERATION:  Exploration of left thumb-index web space laceration with identification of multiple arterial bleeders that were controlled with Gelfoam packing and electrocautery followed by debridement of devitalized muscle.  See previous written history and physical.  LABORATORY DATA:  BMET:  Sodium 148, potassium 4.4, chloride 106, CO2 28. Creatinine was 0.8.  CBC revealed a white count of 7100, hemoglobin 13.5 g percent, platelet count 310,000.  HOSPITAL COURSE:  [name] was admitted on an urgent basis from the emergency room and taken to the operating room.  Her arterial hemorrhage was controlled by the use of a blood pressure cuff as an arterial tourniquet.  After routine Betadine prep and induction of general anesthesia, the wound was explored.  She had a very complex wound with laceration of multiple muscles including the adductor pollicis muscle and the first dorsal interosseus muscle.  She had injured multiple branches of the princeps pollicis artery and, due to chronic use of Celebrex, had brisk bleeding poorly controlled by direct pressure.  With the aid of an arterial tourniquet and micro instruments, we were able to eventually ligate and electrocauterize all of the bleeding vessels.  The  princeps pollicis artery was carefully explored and found to be intact except for the side branch lacerations.  The wound did not extend directly to the region of the ulnar proper digital nerve; rather, the nerve was protected by the flexor pollicis brevis and abductor pollicis brevis muscles.  It is possible, however, that the tip of the knife did penetrate the carpal tunnel and may have caused an injury to the median nerve in the carpal tunnel.  In view of the bleeding complexities noted at the time of surgery, I elected not to explore the carpal tunnel and create an additional wound.  We will observe Ms. Lease ror approximately two weeks.  If her sensibility does not improve, we may need to explore her carpal tunnel and possibly repair the median nerve.  It is entirely possible that her altered sensibility is due to the extensive lacerations to the side branches of the princeps pollicis artery leading to proper digital nerve ischemia.  She was placed in a compressive dressing following repair of her wound and observed for approximately 18 hours.  She had no further difficulties with her vital signs or signs of further hemorrhage.  She is discharged to the care of her family on the afternoon of Aug 31, 1999, to follow up with our office on Sep 03, 1999.  She is given Talwin NX, one or two tablets p.o. q.4-6h. p.r.n. pain, a total of 20 tablets with one  refill. She will continue with her routine medications. DD:  08/31/99 TD:  09/01/99 Job: [id number]

## 2010-09-12 NOTE — Op Note (Signed)
NAME:  [name], [name]                          ACCOUNT NO.:  [id number]   MEDICAL RECORD NO.:  [id number]                   PATIENT TYPE:  OIB   LOCATION:  5001                                 FACILITY:  [facility]   PHYSICIAN:  Dionne Ano. Everlene Other, M.D.         DATE OF BIRTH:  [birth year]   DATE OF PROCEDURE:  10/26/2002  DATE OF DISCHARGE:  10/27/2002                                 OPERATIVE REPORT   PREOPERATIVE DIAGNOSIS:  Chronic ulnar collateral ligament insufficiency,  right thumb.   POSTOPERATIVE DIAGNOSIS:  Chronic ulnar collateral ligament insufficiency,  right thumb and loose body within the joint and capsular tissues, right  thumb MCP region.   OPERATION PERFORMED:  1. Repair of chronic insufficiency of ulnar collateral ligament, right thumb     with pull out button technique utilizing local tissue with adductor     advancement.  2. Metacarpophalangeal arthrotomy with removal of loose body and     synovectomy.   SURGEON:  Dionne Ano. Amanda Pea, M.D.   ASSISTANT:  Karie Chimera, P.A.-C.   ANESTHESIA:  General.   COMPLICATIONS:  None.   TOURNIQUET TIME:  Less than 40 minutes.   DRAINS:  None.   INDICATIONS FOR PROCEDURE:  The patient is a very pleasant female who  presents with the above mentioned diagnosis.  I have counseled her in  regards to the risks and benefits of surgery including the risks of  infection, bleeding, anesthesia, damage to normal structures.  Failure of  surgery to accomplish its intended goals of relieving symptoms and restoring  function.  With this in mind, the patient desires to proceed.  All questions  have been encouraged and answered preoperatively.   OPERATIVE FINDINGS:  The patient had chronic ulnar collateral ligament  insufficiency.  She did have adequate tissue available for advancement and  direct repair of the ulnar collateral ligament with pull out button  technique.  She interestingly had a  loose body and capsular synovitis  in  the joint which was debrided and removed.  This was done without difficulty.  She had good stability at the conclusion of the case and no complications  intraoperatively.   DESCRIPTION OF PROCEDURE:  The patient was seen by myself and anesthesia.  She was taken to the operative suite and underwent a smooth induction of  general anesthetic under the direction of Dr. Jean Rosenthal with Mr. Lurline Idol  providing CRNA duties.  Following this, she was given appropriate Ancef and  was then laid supine and appropriately padded and appropriately prepped and  draped in the usual sterile fashion with Betadine scrub and paint.  Once  sterile prep was secured, stress exam of the thumb revealed significant  instability as it did preoperatively.  I then commenced the operation with  an incision, curvilinear in nature over the ulnar aspect of the thumb.  Dissection was carried down.  Superficial radial nerve identified, protected  and swept superiorly.  Following this, the adductor was incised and  reflected.  This revealed the chronic tearing of the ulnar collateral  ligament.  However, the ulnar collateral ligament did have fairly good  healthy fibers that were not curled.  At this time I then advanced the ulnar  collateral ligament nicely and took the capsular investments off the  proximal phalanx.  I denuded the proximal phalanx and prepared this for a  direct repair as it looked excellent for repair.  I performed an arthrotomy  of the MCP joint at this time and a synovectomy and removal of loose body as  well as a loose capsular investment about the thumb.  Preoperatively, there  was some clicking and catching and I felt that this was attributable to the  intraoperative findings of a loose body and capsular synovitis, particularly  about the ulnar aspect.  There was some mild wear at the MCP joint but  nothing excessive.  Once debridement occurred and adequate irrigation  placed, I then placed a Bunnell  stitch with 2-0 Prolene in the ulnar  collateral ligament and advanced this via Mellody Dance needles through the ulnar  aspect of the proximal phalanx. These exited dorsoradially and were tied  over a felt button.  The thumb was held reduced in extension and ulnar  deviation during this period.  This was done to my satisfaction without  difficulty and there were no complicating features with this.  The patient  then had stress testing down.  I was very pleased with this.  Following  this, I then repaired the capsule followed by repair of the adductor  aponeurosis with 4-0 Ethibond.  The patient then had copious irrigation  applied and the skin was closed with Prolene.  The tourniquet was deflated  prior to the capsular closure.  Excellent hemostasis was obtained.  Refill  was excellent and there were no complicating features.  The patient had  Sensorcaine without epinephrine 0.5% placed in the wound for postoperative  analgesia.  There were no complicating features of the orthopedic portion of  her procedure.  We will look forward to monitoring her recovery and proceed  accordingly.  All questions have been encouraged and answered.                                                Dionne Ano. Everlene Other, M.D.    Nash Mantis  D:  10/26/2002  T:  10/27/2002  Job:  [id number]

## 2010-09-12 NOTE — H&P (Signed)
[place]. [facility]  Patient:    [name], [name]                      MRN: [id number] Adm. Date:  12/01/00 Attending:  Kerrin Champagne, M.D. Dictator:   Dorie Rank, P.A.-C. CC:         Kern Reap, M.D.  [name], M.D.   History and Physical  CHIEF COMPLAINT:  Ongoing cervical pain.  HISTORY OF PRESENT ILLNESS:  [name] is a pleasant 60 year old white female who has undergone 11 prior surgeries to her C spine, as well as fusions.  She most recently, in February 2002, underwent an anterior cervical diskectomy and fusion at C3-4 and C7-T1 with allograft and plates and screws. Since that time she has had continued pain.  She had a CT scan recently which revealed a nonhealed fusion site at C7-T1.  It was felt - due to her physical exam as well as diagnostic studies and continued pain - that she would benefit from undergoing a posterior fusion at C7-T1 with triple-wire technique and bilateral foraminotomies.  The risks and benefits as well as the procedure were discussed with the patient.  She agreed to proceed.  PHYSICIANS:  Patients cardiologist is Dr. [name]; medical doctor is Dr. Kern Reap.  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  History of mitral valve prolapse.  History of supraventricular tachycardia controlled with her current medication regimen. Also history of depression.  She has dental implants and takes p.o. doxycycline for infection prophylaxis.  PAST SURGICAL HISTORY:  Patient has undergone a total of 11 surgeries to her neck, most recently in February 2002.  Tonsillectomy as a child.  MEDICATIONS: 1. Atenolol 75 mg one p.o. q.d. 2. Verapamil 240 mg one p.o. q.h.s. 3. Vicodin 5 mg one to two p.o. q.4-6h. p.r.n. pain. 4. Baclofen 20 mg one p.o. t.i.d. p.r.n. spasm. 5. Clonazepam 0.5 mg one p.o. q.h.s. p.r.n. 6. Zoloft 50 mg one  p.o. q.d. 7. Doxycycline  one p.o. q.d.  Patient instructed to bring this dosage to the    hospital with her.  ALLERGIES:  MORPHINE and DOLOPHINE caused severe itching and nausea.  CELEBREX and NAPROSYN cause extremity swelling.  SOCIAL HISTORY:  The patient is married.  No children.  She is currently on disability.  She is a nondrinker.  Tobacco use:  One-half to one pack of cigarettes per day.  FAMILY HISTORY:  Mother deceased at age 59 - history of CHF, diabetes mellitus, renal failure, and multiple MIs.  Father living at age 57 - silent MI, history of Alzheimers disease.  REVIEW OF SYMPTOMS:  GENERAL:  No fevers, chills, night sweats, or bleeding tendencies.  PULMONARY:  No shortness of breath.  Recent upper respiratory infection two-and-a-half weeks ago.  States that she is much better since that time.  CARDIOVASCULAR:  No chest pain, angina, or orthopnea.  GI:  Occasional constipation while on Vicodin.  No melena, diarrhea, or bloody stools.  GU: No hematuria, discharge, or dysuria.  NEUROVASCULAR:  No seizures, headaches, or paralysis.  MUSCULOSKELETAL:  Left neck pain as described above.  PHYSICAL EXAMINATION:  GENERAL:  A 60 year old thin white female, alert and oriented x 3.  VITAL SIGNS:  Pulse 66, respirations 18, blood pressure 110/70.  HEENT:  Head is atraumatic, normocephalic.  NECK:  Supple.  Negative for carotid bruits bilaterally.  CHEST:  There are rhonchi noted to  the left base.  No wheezes.  BREAST:  Not pertinent to present illness.  HEART:  S1, S2.  Negative for a murmur or gallop.  Heart is regular in rate and rhythm.  ABDOMEN:  Concave, nontender.  No masses appreciated on palpation.  Positive bowel sounds.  GENITOURINARY:  Not pertinent to present illness.  EXTREMITIES:  She has decreased range of motion to the cervical spine with flexion-extension and lateral rotation.  Muscle strength to the bilateral upper extremities is 3/5 and symmetrical.  She has noted pain into  her left and right arms, left greater than right.  Biceps and triceps reflexes are 2+ and symmetrical.  SKIN:  There is a scar noted to the posterior neck.  There is a transverse to the anterior lateral left neck.  No rashes or lesions.  LABORATORY DATA:  Preoperative laboratory and x-rays are pending at this time.  IMPRESSION: 1. Nonunion cervical fusion C7-T1. 2. History of supraventricular tachycardia. 3. Depression. 4. Mitral valve prolapse.  PLAN:  Patient is scheduled for a posterior cervical fusion with triple-wire technique and bilateral foraminotomies at C7-T1. DD:  11/25/00 TD:  11/25/00 Job: [id number]

## 2010-09-12 NOTE — Discharge Summary (Signed)
[facility]  Patient:    RIANNON, MUKHERJEE Visit Number: [id number] MRN: [id number]          Service Type: SUR Location: [id number] Attending Physician:  Lubertha South Dictated by:   Dorie Rank, P.A. Admit Date:  12/08/2000 Discharge Date: 12/12/2000                             Discharge Summary  ADMISSION DIAGNOSES: 1. Nonunion cervical fusion, C7-T1. 2. History of supraventricular tachycardia. 3. Depression. 4. Mitral valve prolapse.  DISCHARGE DIAGNOSES: 1. Nonunion cervical fusion, C7-T1, status post fusion. 2. History of supraventricular tachycardia. 3. Depression. 4. Mitral valve prolapse.  PROCEDURE:  On December 08, 2000, the patient underwent a bilateral ______ foraminotomy at the C7-T1 levels and posterior innerspinous process wiring of the fusion using triple-wire technique and iliac crest bone graft harvest on the right side posteriorly.  SURGEON:  Kerrin Champagne, M.D.  ASSISTANT:  Illene Labrador. [place], M.D.  ANESTHESIA:  General.  COMPLICATIONS:  None.  HISTORY OF PRESENT ILLNESS:  The patient is a 60 year old female who has undergone multiple cervical spine fusions extending from C3-T1.  The last surgery she had was anterior diskectomy and fusion at the C3-4 level, and at C7-T1 level in February 2002.  She has since gone on to develop a nonunion at the C7-T1 level anteriorly.  It was felt she would benefit from undergoing the above-stated procedure.  Risks and benefits as well as the procedure were explained to the patient, and she agreed to proceed.  HOSPITAL COURSE:  The patient had the above-stated surgery and tolerated this well.  While in the operating room, the incision was dressed in a sterile fashion.  The dressings were found to be clean, dry, and intact on postoperative day #1.  Dressing was changed daily thereafter and found to be free of any drainage or erythema.  Initially for pain, the patient  utilized a PCA Demerol.  After this was discontinued, she did have some issues with pain control and was finally adequately controlled on Percocet.  She was started on OxyContin at one point but stated that she had some hallucinations with this.  An IV was started while in the operating room.  IV fluids were ran until postoperative day #2, and she was noted to be taking p.o. well.  Initially, postoperatively, she was on a clear liquid diet.  By postoperative day #2, she was tolerating a full diet well.  She was on 2 L of oxygen nasal cannula for 24 hours, and this was discontinued.  She was on Ancef IV for 24 hours for postoperative infection control.  Her home medications were resumed these included atenolol, Verapamil, Baclofen, and Zoloft.  Initially postoperatively, she was placed in a [place] collar.  On December 09, 2000, she was placed in a cervical thoracic brace.  Physical therapy worked with her on how to on and off this, and she felt comfortable with this prior to discharge.  Vitals and neurovascular checks were checked routinely and found to be intact. She used ice packs for the first 48 hours to her incision sites.  On December 12, 2000, after her pain was adequately controlled on p.o., she was felt to be medically and orthopedically stable for discharge.  DIAGNOSTIC STUDIES:  H&H on November 25, 2000 were 13.5 and 40.2 respectively. MCV was 108.0.  RBC was 3.72.  Coagulation studies on November 25, 2000 were within normal limits.  B-MET on November 25, 2000 were within normal limits. Liver function studies on November 25, 2000 were within normal limits.  UA on November 25, 2000 was within normal limits other than a specific gravity of 1.037.  EKG on December 08, 2000, tracings revealed normal sinus rhythm.  No significant changes since last tracing was confirmed by Dr. Charolette Child. Radiology:  None.  CONDITION ON DISCHARGE:  Improved.  DISPOSITION/FOLLOW-UP APPOINTMENT:  The  patient was discharged to home.  She was to wear her C-T brace at all times and felt comfortable with this.  Daily dressing changes to the incision sites.  Call if should she have any erythema or drainage from these.  She was to resume her home medications.  She was given a prescription for Percocet for pain.  Regular diet.  She could shower on postoperative day #5 with assistance.  She was to follow up with Dr. Otelia Sergeant in approximately ten days.  She is to call for an appointment. Dictated by:   Dorie Rank, P.A. Attending Physician:  Lubertha South DD:  01/20/01 TD:  01/20/01 Job: [id number]

## 2010-09-12 NOTE — Discharge Summary (Signed)
[name], [name]                ACCOUNT NO.:  [id number]   MEDICAL RECORD NO.:  [id number]          PATIENT TYPE:  INP   LOCATION:  5008                         FACILITY:  [facility]   PHYSICIAN:  Kerrin Champagne, M.D.   DATE OF BIRTH:  08-21-1950   DATE OF ADMISSION:  02/09/2005  DATE OF DISCHARGE:  02/15/2005                                 DISCHARGE SUMMARY   ADMISSION DIAGNOSES:  1.  Severe adjacent segment degenerative disk disease -- L2-3 above and L3      to sacrum fusion; with spinal stenosis. Spinal stenosis central and      lateral recess of the L2-3 level.  2.  Depression.  3.  Supraventricular tachycardia.  4.  Mitral valve prolapse.  5.  Hyperlipidemia.  6.  Chronic obstructive pulmonary disease .  7.  Chronically low blood pressure  8.  Multiple lumbar surgeries, with extensive fusion both in the lumbar area      and the cervical spine.   DISCHARGE DIAGNOSES:  1.  Severe adjacent segment degenerative disk disease -- L2-3 above and L3      to sacrum fusion; with spinal stenosis. Spinal stenosis central and      lateral recess of the L2-3 level.  2.  Depression.  3.  Supraventricular tachycardia.  4.  Mitral valve prolapse.  5.  Hyperlipidemia.  6.  Chronic obstructive pulmonary disease .  7.  Chronically low blood pressure  8.  Multiple lumbar surgeries, with extensive fusion both in the lumbar area      and the cervical spine.  9.  Post-hemorrhagic anemia.  10. Severe postoperative constipation without ileus.  11. Postoperative fever, felt to be related to atelectasis.  Treated with      incentive spirometry.   PROCEDURES:  On February 09, 2005 the patient underwent central laminectomy  at L2-3, with left-sided Leopard cage TLIF. Removal of Stryker pedicle  screws and rods at L3-4. Posterolateral fusion L2-3, with local and Symphony  bone graft.  Internal fixation L2-L4, utilizing Monarch pedicle screws and  rods. This was performed by Dr. Otelia Sergeant and assisted by  Maud Deed PA-C  under general anesthesia.   CONSULTATIONS:  None.   BRIEF HISTORY:  The patient is a 60 year old female with numerous procedures  for fusion of the cervical and lumbar spine. In the cervical spine she has  fusion from C4-C7, and in the lumbar region fusion L3 to sacrum. The patient  has had several months worsening of back pain, with radiation into the  buttocks and thighs. She has difficulty with sitting, bending, stooping or  riding in a car. She has undergone extensive evaluation, including MRI --  demonstrating central stenosis at L3-4 with severe adjacent segment  degenerative disk changes at L2-3 above and L3 to sacrum fusion.. Prior to  admission, the patient was requiring a significant amounts of narcotic  medication. It was felt she would require surgical intervention, as stated  above, to assist with her pain control and alleviate her symptoms.   BRIEF HOSPITAL COURSE:  The patient was admitted to the hospital and  underwent the procedure as stated above. Postoperatively, neurovascular  motor function of the lower extremities was noted to be intact. She was  started on PCA analgesics and gradually weaned to p.o. analgesics.  Postoperatively hemoglobin was 11.8 and 34.6. The patient did not require  blood transfusion during the hospital stay. She continued to have elevated  temperatures throughout the hospital stay, treated with incentive  spirometry. Foley catheter was discontinued and the patient was able to void  without difficulty. She had difficulty with constipation, requiring daily  stool softeners, laxatives and eventually an enema resulting in a bowel  movement. Bowel sounds were noted after the patient was treated for  constipation. The patient was started on physical therapy for ambulation and  gait training. She had an Aspen LSO utilized during physical therapy, which  we allowed to be removed for bed rest. Her diet was advanced slowly to a   regular diet. IV was Hep-Lock and eventually discontinued when she was  taking larger amounts of liquids. Hemovac drain was discontinued on the  first postoperative day. Dressing changes were done daily thereafter,  showing no signs of erythema, edema or drainage. On March 18, 2005 the  patient was felt stable for discharge to home.   PERTINENT LABORATORY VALUES:  On admission:  CBC -- Hemoglobin 14.8,  hematocrit 43.7. Hemoglobin dropped to the lowest value of 10.0 and 29.6  prior to discharge. Coagulation studies on admission normal. Chemistry  studies were documented on the night of the 17th and 21st -- all being  within normal limits. Urinalysis on admission was negative for urinary tract  infection. KUB on 02/14/2005 showed normal bowel gas pattern. No evidence of  obstruction. No chest x-ray or EKG is on the chart at the time of this  dictation.   PLAN:  Arrangements were made for the patient to be discharged to her home.  [name] will assist with home health physical therapy and occupational  therapy. The patient was advised to wear her Aspen LSO at all times,  except  when not ambulating. She will utilize a walker for ambulation. She will  avoid driving or lifting. She will be allowed to shower when there is no  drainage from her wounds.   The patient will follow up in 2 weeks with Dr. Otelia Sergeant for a recheck.   MEDICATIONS AT DISCHARGE:  1.  OxyContin 20 mg q.12 h.  2.  OxyIR 1-2 q.4-6 h. as needed for pain.  3.  Robaxin 500 mg  1 q.8 h. as needed for spasm.  4.  She was advised to use a stool softener daily while using narcotics.  She will resume her home medications as taken prior to admission.   DISCHARGE DIET:  She will be on an unrestricted diet.   DISPOSITION:  All questions encouraged and answered at the time of  discharge.     Wende Neighbors, P.A.      Kerrin Champagne, M.D.  Electronically Signed  SMV/MEDQ  D:  03/26/2005  T:  03/26/2005  Job:  [id number]

## 2010-09-12 NOTE — Op Note (Signed)
[name], [name]                ACCOUNT NO.:  [id number]   MEDICAL RECORD NO.:  [id number]          PATIENT TYPE:  INP   LOCATION:  2899                         FACILITY:  [facility]   PHYSICIAN:  Kerrin Champagne, M.D.   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  Spinal stenosis is  central and lateral recess at the L2-3 level.   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  Spinal stenosis is  central and lateral recess at the L2-3 level.   PROCEDURE:  Central laminectomy L2-3 with left-sided 7-mm Leopard TLIF L2-3  utilizing local bone graft.  Removal of Stryker pedicle screws and rods L3-  4.  Posterolateral fusion L2-3 with local and Symphony bone graft materials.  Internal fixation L2 to L4 using Monarch pedicle screws and rods.   SURGEON:  Kerrin Champagne, M.D.   ASSISTANT:  Wende Neighbors, P.A.C.   ANESTHESIA:  General via orotracheal intubation, Dr. Jean Rosenthal.   SPECIMENS:  None.   ESTIMATED BLOOD LOSS:  400 cc.   COMPLICATIONS:  None.  The patient was returned to the PACU in good  condition.   HISTORY OF PRESENT ILLNESS:  The patient is a 60 year old female who has  undergone numerous fusion surgeries in the cervical and lumbar spine.  In  the cervical level from C4 to C7 and in the lumbar region from L3 to sacrum.  She has noted progressive worsening of pain into back, radiation into her  buttocks and thighs with sitting, bending, stooping, riding in a car for any  length of time, lifting.  She has undergone evaluation with MRI.  She  demonstrated central stenosis at the L3-4 level and severe adjacent segment  degenerative disc changes at L2-3 above an L3 to  sacrum fusion.  She is  presently requiring strong amounts of narcotic medication to relieve her  pain. She is brought to the operating room to undergo extension of her  fusion in the lumbar spine to the L2 level with instrumentation.   INTRAOPERATIVE FINDINGS:  The patient was found to have severe hypertrophic  changes involving the medial aspect of the L2-3 facet and its ligamentum  flavum impinging upon the thecal sac and the bilateral L3 nerve roots as  well as the exiting L2 nerve roots at the L2-3 level.  Degenerative disc  disease L2-3 with retrolisthesis of L2 on L3.   DESCRIPTION OF PROCEDURE:  After adequate general anesthesia, the patient  was transferred to the [facility] fracture table.  She had Foley catheter  placed.  Standard preoperative antibiotics of Ancef.  She was in a prone  position.  Chest rolls were used, pillows used.  Bilateral TED hose.  Intraoperative monitoring using continuous EMGs and soft tissue resistance  monitoring of pedicle screws.  The patient had all  pressure points well  padded.  Standard prep with DuraPrep solution from the mid dorsal spine to  the sacrum level, draped in the usual manner and Vi-Drape was used.  The  incision ellipsing the upper three inches of the old incision scar,  continuing upwards to the additional L1 and T12 level and downwards to the  L4 level.  The incision through skin and subcutaneous layers ellipsing the  old incision scars noted.  Carried down to the spinous process of T12, L1,  L2, L3 that was residual and L4.  Paralumbar muscles stripped off of the  posterior elements of the spinous process of L1, L2, L3 bilaterally.  The  exposure carried out over the facet at the L2-3 level down over the  posterolateral masses at the L4-5 level, exposing the previous hardware at  L3-4 bilaterally.  The transverse process of L2 identified and exposed both  sides, preserving the facet capsule of the L1-2 level.  The Viper retractors   were used.  Bleeders controlled using bipolar and monopolar electrocautery.   Central laminectomy was then performed using a Leksell rongeur to excise the  spinous process of L2, superior portion of the L3 spinous process and the  inferior one-half of the spinous process of L1.  The inferior aspect of the  lamina of the L1 was also thinned using Leksell rongeur to allow for removal  of the ligamentum flavum at the L1-2 level in order to decompress this level  as well as L2-3.  Osteotome was then used to resect and perform medial  facetectomy bilaterally at the L2-3 level, removing this bone piecemeal,  allowing it to be used for bone grafting purposes both posterolateral and  within the interbody fusion region.  Kerrisons then used to remove the  central portion of the lamina of the L2 level up to the L1-2 interspace,  decompressing the canal centrally.  Lateral recesses then decompressed at  the L1-2 level with excising the ligamentum flavum at this level as well as  excising the flavum off the medial aspect of the facet at the L1-2 level on  both sides.  Continuing down the lateral recess at the L2-3 level,  hypertrophic ligamentum flavum was then debrided bilaterally, decompressing  the lateral recess at L2-3 and continuing down and performing foraminotomy  over bilateral L3 nerve roots, and centrally removing the superior portion  of lamina of L3 to decompress the canal fully at the L2-3 level.  Bone graft  removed was then combined with Symphony bone graft material that was  obtained using the patient's platelet-rich plasma as well as allograft bone  material.  Additional local bone graft was kept pristine for use in the  interbody fusion region.  Following the decompression then a hockey-stick  nerve probe could be passed out the neural foramen on the right side of the  L2 nerve root, the L3 neural foramen and the lateral recess at L1-2 was felt to be well decompressed.  On the left  side, an osteotome was used to resect  the inferior articular process of L2 on the left side and then osteotome was  used to further remove superior articular process of L3 on the left side,  performing a wide foraminotomy of the left L2 nerve root.  Bleeders here  were controlled using bipolar electrocautery, Gelfoam thrombin soaked.  The  thecal sac was then carefully mobilized and the disc space identified on the  left side at L2-3.  Bleeders controlled again using  bipolar elect as well as  Gelfoam throughout.  This area was then packed well within the spinal canal  using Gelfoam as well as cottonoids.   Attention then turned to performance of hardware at the L3-4 level.  Using  the DePuy universal hardware removal tray, then the proper hex screw head  was then placed into the top fastener of each of the Osteonics screw  fasteners and used to remove the fasteners as well as the cap, and this was  done at each level without difficulty.  Then using the head breaker for  fasteners for the DePuy system then each of the screws was carefully removed  without difficulty in the L3 and L4 levels.  The rods were then removed as  well.  Each of these screws were found to have measured 6.5 x 45 mm.  A 6.25  screw was chosen to be used at this level as the patient's pedicles were  felt to be on the small side and would not accept a larger screw.  With this  then hemostasis was obtained in the pedicle screw holes using thrombin-  soaked Gelfoam.  The lateral masses were carefully decorticated at the L4-5  level for acceptance of further bone graft.   Attention turned to placing the pedicle screws at the L2 level first on the  left side, using an awl to make an entry into the lateral aspect of the  superior articular process of L2 at its intersection with the transverse  process.  This was observed on C-arm fluoroscopy to be correct position and  alignment.  Then a hand-held pedicle finder was used  to probe the pedicle on  the left side at the L2 level.  Ball-tip probe used to insure patency of the  pedicle.  Tapping performed using a 4.75 tap and then a 5.5 screw was placed  on the left side at L2.  Decortication was carried out over the transverse  process of L2.  Symphony bone graft was then applied out over the transverse  process of L2 on the left side and carried down to the posterior fusion mass  at the L3-4 level to obtain good bone grafting and posterolateral fusion of  this area.  Attention was turned to the right side where an awl was used to  make an entry point at the lateral aspect of the superior articular process  of L2 at its intersection with the transverse process of L2.  This was found  to be in the correct position and orientation on the C-arm fluoroscopy.  A  pedicle finder then was used to probe the pedicle at the L2 level on the  right side.  Tapping was performed after using a ball-tip probe to insure patency of the pedicle.  Each of these screws measured 40 mm in length, and  5.5 screws were used on the right side, tapping with a 4.75 tap and then  placing a 40 x 5.5 screw on the right side.  Note that decortication was  carried out over the transverse process on the right side of L2 and also  over the lateral mass at the L3-4 level of fusion.  Screws placed at the L3  and L4 bilaterally were 6.5 x 45 mm screws, and these were done without  difficulty on both sides.   Pedicle screw soft tissue resistance monitoring was carried out during the  procedure.  On the left side at the L2 level, an 11 value was obtained.  This was rechecked and was felt to be 11 or 12, still greater than 8 which  is the lowest, indicating abnormality.  Observing the medial aspect of the  pedicle as well as the inferior aspect of the pedicle, there was no evidence  of broaching of the cortex here.  The pedicle screw on the right side at L2  measured 61 soft tissue resistance.  At the  L3 level, 36 on the left and 36  on the right.  On the left L4 level 21 and on the right 27.   After testing soft tissue resistance, each of the fastener heads was  carefully loosened with the head breakers.  A 65-mm curved rod then placed  in the left side fasteners, then in the right side fasteners and then  temporarily the caps over the fasteners were placed.   Attention then turned to performing a TLIF on the left side at the L2-3  level by retracting the thecal sac and the L2 nerve root well above the disc  space.  A 15-blade scalpel used to incise the disc.  Pituitary rongeur was  used to remove disc material.  The end plate spurs were resected using 1/4-  inch and then regular osteotome.  Pituitary then used to debride the disc  space.  Sounding and dilatation of the disc performed using 8-mm dilator  which was quite tight.  A 7-mm cage was chosen.  End plates were then  debrided using a laminar spreader between the inferior aspect of the L1  lamina and the superior aspect of L3, spreading the disc space at L2-3 quite  nicely.  Curets were then used to debride the end plates of cartilaginous  material as well as the disc space and degenerative disc material.  First  upbiting, downbiting and straight curets were used and then ring curets.  This was debrided back to bleeding bone surfaces.  Local bone graft was then  placed within the intervertebral disc space after sounding with a trial 7-mm  sounder.  This appeared on C-arm evaluation to be an excellent choice in  size and position and alignment.  The patient then had bone graft place  within the intervertebral disc space.  Additional further impaction of bone  graft carried out using the TLIF impacters as well as the 7-mm trial TLIF.  Note that the first 7-mm cage cracked as it was being inserted.  There was  no significant problem with soft tissue structures.  This was noted prior to  the full insertion, and this was then easily  removed by extracting it using the inserting device.  A second cage was then able to be inserted, packed  into place without difficulty and kicked into a transverse plan subset  beneath the posterior aspect of the disc space at least 3 to 4 mm.  Observed  on the AP and lateral views to be within the central portions of the disc.  With this then, the rods were then carefully positioned within the fasteners  bilaterally, and the top fastener was then torqued, tightened to 80 foot  pounds at the L2 level on the left side.  Compression then obtained between  L2 and L3 on the left side and the fastener then attached to the rod on the  left side at L3 to 80 foot pounds, then on the left side at L4 to 80 foot  pounds.  Between L3 and L4, this was kept at static position.  Compression  between  the L3 and L2 level was obtained on the left and on the right side  between L2 and L3 as well, first fastening the fastener to the rod at the L2  level and obtaining 80 foot pounds and then compressing between L2 and L3,  fastening the rod to the fastener and compressing at 80 foot pounds, leaving  static between the L4 and L5 fasteners, and then tightening the L4 fasteners  to the rods at 80 foot pounds.  This completed the instrumentation  posteriorly.  Permanent C-arm images were obtained in AP and lateral planes  demonstrating pedicle screws and rods all in good position and alignment as  was the TLIF at the L2-3 level.  With this then, irrigation was then  performed.  Careful inspection using a hockey-stick nerve probe noted that  all the neural foramen were well open and well decompressed.  Bleeders were  controlled using bipolar electrocautery on the left side at the L2 level.  Additional Surgicel as well as Gelfoam was placed.  All excess Gelfoam was  removed and a thin layer of Gelfoam placed over the posterior laminotomy  region extending from L1-2 to L2-3.  Medium Hemovac drain placed in the  depth  of the incision, exiting over the right side lower lumbar region.   The paralumbar muscles reapproximated in midline with interrupted #1 Vicryl  sutures.  The lumbodorsal fascia approximated in midline with interrupted #1  Vicryl sutures in simple figure-of-eight fashion.  Deep subcutaneous layer  was approximated with interrupted 0 and 2-0 Vicryl sutures were used to  close the more superficial fascial layers.  Skin then closed with a running  subcuticular stitch of 4-0 Vicryl.  Tincture of benzoin and Steri-Strips  applied, 4 x 4's, ABD pad affixed to the skin with Hypafix tape.  The  patient then reactivated, extubated and returned to the recovery room in  satisfactory condition.  All instrument and sponge counts were correct.      Kerrin Champagne, M.D.  Electronically Signed     JEN/MEDQ  D:  02/09/2005  T:  02/09/2005  Job:  [id number]

## 2010-09-12 NOTE — Op Note (Signed)
NAME:  [name], [name]                          ACCOUNT NO.:  [id number]   MEDICAL RECORD NO.:  [id number]                   PATIENT TYPE:  AMB   LOCATION:  DSC                                  FACILITY:  [facility]   PHYSICIAN:  Dionne Ano. Everlene Other, M.D.         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   PROCEDURE:  1. Ulnar collateral ligament reconstruction with flexor carpi radialis     tendon graft one-third slip to bony tunnels.  2. Pinning right thumb metacarpophalangeal joint (external fixation right     thumb metacarpophalangeal joint).  3. Evaluation under anesthesia and fluoroscopy right thumb.  4. Stress radiography.   SURGEON:  Dionne Ano. Amanda Pea, M.D.   ASSISTANT:  Karie Chimera, P.A.-C.   COMPLICATIONS:  None.   ANESTHESIA:  Axillary block with IV sedation.   TOURNIQUET TIME:  Less than one hour.   ESTIMATED BLOOD LOSS:  Minimal.   DRAINS:  None.   INDICATIONS:  This patient is a very pleasant female who has failed a  primary repair of her ulnar collateral ligament.  This tissue stretched out  significantly.  She is 60 years of age, very active, and desires stability  about her thumb.  Unfortunately, in 35 degrees of flexion, she has a high  degree of instability and pain.  I have discussed with her that the goals of  surgery are to restore her stability and relieve her pain and note to her  that she will have some stiffness in the thumb in my opinion.  She  understands the risks and benefits and desires to proceed.  All questions  have been encouraged and answered preoperatively.   DESCRIPTION OF PROCEDURE:  The patient was seen by myself in anesthesia.  She was taken to the operative suite and underwent axillary block in the  holding area which was noted to be in excellent working fashion.  She was  given preoperative Ancef, placed supine, appropriately padded, prepped and  draped in the usual sterile fashion about the right upper extremity.  Once  this was done, evaluation under anesthesia and fluoroscopy revealed chronic  ulnar collateral ligament insufficiency which was noted preoperatively as  well.  At this time, the tourniquet was inflated 250 mmHg.  An incision was  made along the ulnar aspect of her thumb MCP joint.  The superficial radial  nerve was identified and they underwent a neurolysis as it was heavily  encased in scar tissue.  The neurolysis was done under 4.0 magnification  protecting the nerve at all times.  It was swept dorsally. Following this,  the old interval about the adductor pollicis tendon insertion  was reopened  and the insufficiency identified.  I then removed portions of degenerative  ligament which simply did not stay healthy after the primary repair and once  this was done made drill holes at 1 and 5 o'clock and opened them to 3 mm  with the drill bit.  I found the point of isometry about the metacarpal head  and then with a drill enlarged this hole to pass a tendon graft.  This was  placed from distal ulnar to proximal radial and exiting radially.  I made a  counter incision here and dissected down to the periosteal tissue which was  noted.  Following this, I then obtained an FCR tendon graft.  The patient  did not have a good palmaris.  I took a one-third slip of the FCR with  multiple transverse incisions taking care to preserve the main bulk of the  tendon.  Once this was harvested, the transverse incisions were irrigated  and closed and the tendon graft was placed through the drill hole at 1 and 5  o'clock and following this the two ends were sewn together and placed  through the distal hole about the  metacarpal and allowed to be pulled  through the proximal radial drill hole.  I then snugged this down nicely,  placed a 0.45 Kirschner wire for external fixation under fluoroscopy and  then tightened the tendon graft nicely.  I tightened it and sutured it to  periosteal tissue with 4-0 Ethibond suture.  The tendon reconstruction  looked excellent. I was very pleased with this.  This was a tendon  reconstruction utilizing one-third proper portion of the FCR through bony  tunnels at 1 and 5 o'clock on the proximal phalanx and a hole through the  point of isometry about the metacarpal head.  External fixation was applied  without difficulty with 0.45 Kirschner wire and pin cap was placed.  I then  deflated the tourniquet, obtained hemostasis and repaired the abductor.  The  superficial radial nerve had undergo neurolysis.  This looked excellent and  I was pleased with this.  All wounds were irrigated copiously and then  closed with interrupted Prolene.  I did debulk some of the scar tissue as  well about the ulnar aspect of the thumb.  She tolerated the procedure well  and was placed in a thumb spica  splint postoperatively and had no tight  compartments or other problems.  The excellent refill was noted and all  sponge and instrument counts were reported as correct.  She will be  monitored in the recovery area and spending the night for observation.   It has been an absolute pleasure to participate in her care.  We look  forward to participating in her postoperative recovery.                                               Dionne Ano. Everlene Other, M.D.    Nash Mantis  D:  02/08/2003  T:  02/09/2003  Job:  [id number]

## 2010-09-12 NOTE — Discharge Summary (Signed)
[place]  Patient:    [name], [name]                       MRN: [id number] Adm. Date:  [id number] Disc. Date: [id number] Attending:  Lubertha South Dictator:   Druscilla Brownie Shela Nevin, P.A.-C. CC:         [name], M.D.   Discharge Summary  OPERATION:  May 28, 2000.  The patient underwent anterior cervical diskectomy and fusion C3-4 and C7, T1, with dense cancellous allograft bone graft, plates, and screws.  Dr. Shelle Iron assisted.  CONSULTS:  None.  BRIEF HISTORY:  This 60 year old lady with continuing problems of cervical spine.  She has had previous fusion but has increasing problems with pain into the neck with radiation into the left upper extremity.  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  She was admitted for the above procedure.  HOSPITAL COURSE:  The patient tolerated the surgical procedure quite well. Neurovascular remained intact to the upper extremities, and she had decreased pain and discomfort in her left upper extremity.  The drain that was left in place postoperatively had a considerable amount of drainage on the first and second postop day.  It was decided to keep her here for observation of the drain, as it was sutured in and would need to be removed by medical staff.  We covered her with antibiotics while the drain was in place.  On the day of discharge, there was minimal drainage in the tube.  She had continuing reduction in discomfort in the left upper extremity, neurovascularly was intact, and she was afebrile.  She stated that her husband would be able to care for her at home, so it was decided to discharge her home.  The suture was removed, and the drain was pulled at the time of discharge, and there was no drainage from the drain portal on the anterior neck.  This was covered with a 2 x 2 gauze.  LABORATORY VALUES:  Hematologically showed B12  within normal range. Bilirubin was .4.  Blood chemistries were essentially normal.  CBC was essentially within normal limits.  There was an elevated hemoglobin at 15.2. MCV was 104.3.  TSH was .749.  Urinalysis negative for urinary tract infection.  Chest x-ray showed COPD and no active cardiopulmonary disease radiographically.  Electrocardiogram from [facility] showed normal sinus rhythm.  CONDITION ON DISCHARGE:  Improved and stable.  PLAN:  The patient discharged to her home in the care of her husband.  She is to wear her collar 90% of the time.  She may shower in a few days.  Keep the wound dry.  Use dry dressing to the portal site for the next couple of days as well.  Avoid any strenuous activities or automobile trips.  She is to return to the center to see Dr. Otelia Sergeant in about 10 days.  She is to continue with home medications and diet and follow up with Dr. Nicki Guadalajara per his instructions.  DISCHARGE MEDICATIONS: 1. Vicodin #50 with 1 refill, 1-2 q.4-6h. p.r.n. pain. 2. Skelaxin 400 mg #50 with a refill, 1-2 q.6-8h. p.r.n. muscle spasm.  Call if any problems. DD:  05/31/00 TD:  06/01/00 Job: [id number]

## 2010-09-12 NOTE — Op Note (Signed)
[place]. [facility]  Patient:    [name], [name]                       MRN: [id number] Proc. Date: 12/08/00 Adm. Date:  [id number] Attending:  Lubertha South                           Operative Report  PREOPERATIVE DIAGNOSIS:   Nonunion C7-T1 anterior fusion site.  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  OPERATION:  Bilateral Scoville foraminotomy at the C7-T1 levels and posterior interspinous process wiring of fusion using triple wire technique.  Iliac crest bone graft harvest right side posteriorly.  SURGEON: Kerrin Champagne, M.D.  ASSISTANT:  Illene Labrador. [place], M.D.  ANESTHESIA:  General orotracheal.  ANESTHESIOLOGIST: Cliffton Asters. Ivin Booty, M.D.  ESTIMATED BLOOD LOSS: 50 cc.  DRAINS:  None.  INDICATIONS:  The patient is a 60 year old female who has undergone multiple cervical spine fusions extending from C3 to T1.  The last surgery she had was anterior diskectomy and fusion at the C3-4 level and at the C7-T1 level in February of this year.  She has since gone on to develop a nonunion at the C7-T1 level anteriorly.  She is brought to the operating room to undergo posterior interspinous process wiring and fusion using triple wire technique and right iliac crest bone graft.  INTRAOPERATIVE FINDINGS:  Definite motion noted at the spinous process of C7, T1.  DESCRIPTION OF PROCEDURE:  After adequate general anesthesia with the patient in the prone position, chest rolls, the head on the Mayfield horseshoe.  Gel pad to the face area.  All pressure points were well-padded.  Standard preoperative antibiotics. Standard prep with DuraPrep solution over the posterior neck and right posterior iliac crest.  Draped in the usual manner with iodine impregnated Vi-Drape.  The incision in the midline extending from the expected spinous process of C7 inferiorly to T1.  Through the  skin and subcutaneous layers after infiltration with Marcaine 0.5% with 1:200,000 epinephrine.  The incision was carried sharply through the skin and subcutaneous layers down to the spinous processes.  Electrocautery was then used to carefully expose the spinous process of C7.  Clamp placed over this area and intraoperative radiograph lateral cervical spine demonstrated the clamp on the C7 spinous process.  Next, the spinous process of T1 was exposed and then the Cobb elevators used to elevate the paracervical muscles off the posterior aspect of the lamina of C7 and T1.  Care was taken to enter the laminotomy defect on the left side of C7-T1 during this exposure.  The soft tissues were stripped off the lamina and out laterally to the ends of the facets bilaterally.  Bleeders were controlled using bipolar electrocautery.  The McCullough retractor was inserted.  Note that during the processing of radiograph, a right iliac crest was exposed posteriorly using the old previous incision scar ellipsing it and then moving the skin and subcu layers over the iliac crest and then exposing it subperiosteally both medially and laterally.  This area was then packed with normal saline sponges.  At the cervical spine level, curets were then used to carefully free up soft tissue over the previous foraminotomy site on the left side and a 1 mm Kerrison was then able to be introduced under the lamina of T1.  This was used to carefully remove bone  and perform a large foraminotomy.  High speed bur was used to carefully thin the pedicle on the lower end of the foraminotomy site and then the remaining facet laterally was then completely resected out laterally to decompress the nerve root out far lateral.  Any residual superior articular process was also resected superior to the nerve root decompressing the nerve quite nicely.  The nerve did show signs of inflammation and swelling following the decompression.  A  normal titanium nerve hook could then be passed out the neural foramen without difficulty.  On the right side, a typical Scoville foraminotomy was performed using a high speed bur to thin the posterior aspect of the lamina on the right side at C7, the medial facet at C7-T11 and the superior aspect of the lamina laterally at T1.  After thinning, the 1 mm Kerrison was used to remove the thinned shell of bone.  I then performed a medial fasciectomy.  The C8 nerve root at this level was noted to be compressed as well.  One of 2 mm Kerrisons were then used to remove further facet no the right side until the nerve was completely decompressed and able to be probed using a nerve hook. With the nerve hook, the nerves showed normal appearance and did not show any further compression lateral to the resected facet.  Irrigation was then performed. There was no significant active bleeding present.  Holes were then placed at the base of the spinous process at T1 and C7 using first a towel clamp and then Luan clamp appropriately.  High speed bur was also used to widen the hole slightly at each level. An 18 gauge wire was then passed through the hole at C7, placed beneath the spinous process of C7 and then over the top and then wrapped around the spinous process of T1 and through the hole at the base of the spinous process of T1 and then tightened on the left side of the spinous process using wire tighteners.  Care was taken to remove all kinks from the 18 gauge wire during this maneuver.  Note, the bone graft was harvested from the right posterior iliac crest.  Bone graft was adequate to perform bilateral bone grafting of the spinous process.  An additional bone graft was placed beneath the spinous processes posteriorly and wedged into place with the wiring posteriorly.  Next, 22 gauge wire was passed through the holes, one at C7 and one at T1.  The graft obtained from the right iliac crest was  then carefully shaped to the contour of the posterior aspect of the lamina and spinous process at C7-T1.  A drill hole was then placed in the superior aspect of the graft and the inferior aspect of the graft and then the graft was  carefully placed against both sides of the spinous processes.  Additional cancellous bone graft was placed beneath the graft and the spinous processes as well to augment the fusion here.  These grafts were then tightened down by twisting the wire through the C7 area to the wire at T1 tightening the graft down against the posterior elements of the spinous process.  When this was completed, each of the wires were then carefully bent to prevent their irritating soft tissue locally.  Irrigation was performed.  The right iliac crest was carefully smoothed and trimmed so that it was not prominent beneath the skin.  The right iliac crest bone harvest site was then coated with bone wax.  Irrigation performed,  Gelfoam placed and then the fascia closed over the iliac crest using interrupted #1 Vicryl sutures.  Subcu layers approximated with interrupted 2-0 Vicryl sutures and the skin closed with a running subcu stitch of 4-0 Vicryl.  The posterior circle incision was irrigated and soft tissue allowed to fall back into place.  Careful inspection did not demonstrate any active bleeding present.  The ligament new key level was approximated with interrupted 0 Ethibond suture.  The deep subcu layers were approximated with interrupted 2-0 Vicryl suture and the subcutaneous layers also approximated with interrupted 2-0 Vicryl sutures in an inverted fashion. The more superior aspect of the incision with interrupted 4-0 Vicryl sutures. Tincture of Benzoin and Steri-Strips applied.  Then 4 x 4s applied to each of the areas and then ABD.  It was affixed to the skin with Hypafix tape.  The patient was then placed in the Philadelphia collar, reactivated and returned to the recovery  room in satisfactory condition. All instrument and sponge counts were correct. DD:  12/08/00 TD:  12/08/00 Job: [id number]

## 2010-09-12 NOTE — Op Note (Signed)
[place]. [facility]  Patient:    NILA, WINKER                       MRN: [id number] Proc. Date: 08/31/99 Adm. Date:  [id number] Disc. Date: [id number] Attending:  Sypher, Douglass Rivers CC:         Katy Fitch. Sypher, Montez Hageman., M.D. x 2                           Operative Report  PREOPERATIVE DIAGNOSIS:  Arterial bleeding, left thumb and index web space laceration, rule out laceration of princeps pollicis artery and altered sensibility on ulnar aspect of thumb, rule out laceration of ulnar proper digital nerve.  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  OPERATION: 1. Exploration of princeps pollicis artery and control of bleeding by Gelfoam    packing and electrocautery of multiple side branch lacerations with extensive    adventitial exploration. 2. Debridement of devitalized adductor pollicis first dorsal interosseous and    thenar muscles.  SURGEON:  Katy Fitch. Sypher, Montez Hageman., M.D.  ASSISTANT:  None.  ANESTHESIA:  General orotracheal.  SUPERVISING ANESTHESIOLOGIST:  Lestine Box, M.D.  ESTIMATED BLOOD LOSS:  200 cc, no replacement.  FLUIDS:  For fluids, see anesthesia sheet.  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  Earlier this evening while using a Careers adviser," she accidentally stabbed her left thumb nd index web space with the knife.  She sustained a very deep laceration to the thumb and its web space and had brisk arterial bleeding.  She was brought to the [facility] Emergency Room, evaluated by Dr. Hyacinth Meeker, who noted the arterial bleeding, and asked for hand surgery consult.  On exam in the emergency room under brachial tourniquet anesthesia, she was noted to have a 1.5 cm laceration of the  thumb at its web space, dorsal aspect.  This  extended towards the base of the thumb metacarpal.  Without the tourniquet inflated, she had brisk arterial bleeding.  She also noted abnormal sensibility on the ulnar aspect of her thumb.  Her presumed diagnosis was a probable laceration of the ulnar proper digital nerve of the thumb, and princeps pollicis artery laceration, or dorsal radial artery laceration.  She was prepared on an emergent basis for exploration of her wound.  DESCRIPTION OF PROCEDURE:  The patient was brought to the operating room and placed in the supine position on the operating table.  Following induction of general orotracheal anesthesia, the left arm was prepped with Betadine soap and solution, and sterilely draped.  The operating microscope was brought into position and the arm exsanguinated with an Esmarch bandage.  The arterial tourniquet was inflated to 250 mmHg.  The procedure commenced with an orderly debridement of the wound. The wound was irrigated with sterile saline followed by debridement of devitalized kin along the wound margins.  The wound was extended proximally to allow identification of the dorsal radial arterial branch that penetrated the first dorsal interosseous muscle.  The arterial pathway was probed with a lacrimal duct probe towards the  area of the wound.  The lacrimal duct probe paralleled the path of the dorsal radial artery to the deep arch and the princeps pollicis artery.  The tourniquet  was let down and brisk arterial bleeding was encountered.  Self-retaining retractors were placed and with great care with the use of a sucker, Gelfoam, and direct pressure with Michel forceps, the various arterial branches  were explored.  There were noted to be several side branch lacerations of the princeps pollicis artery.  The adventitia was stripped off the artery to facilitate careful examination of the media.  I could not identify a  through-and-through laceration of the princeps pollicis artery.  However, there were multiple side branch lacerations that were bleeding briskly.  These were controlled with Gelfoam pressure followed by use of the bipolar electrocautery with Michel forceps.  The wound did not appear to involve the proper digital nerves of the thumb.  This raises the question of whether or not the numbness is based on a loss of perfusion to the nerve due to the arterial injury versus a deep penetration of he knife blade to the median nerve proper at the level of the carpal tunnel.  I elected not to explore the carpal tunnel and median nerve at this time.  I could not identify the ulnar proper digital nerve in the wound.  Indeed, the flexor pollicis brevis and abductor pollicis brevis muscles appeared to be minimally involved.  The tourniquet was released and no further bleeding was encountered.  The wound was then repaired with intradermal 3-0 Prolene suture.  No apparent complications.  The patient tolerated the surgery and anesthesia well.  She was transferred to he recovery room with stable vital signs.  Due to the time of night that she reached the recovery room at 1:30 a.m., she will be admitted for observation of her vital signs and observation for further bleeding difficulties.  At the conclusion of the procedure, she had excellent capillary refill in her thumb and adjacent digits.  She will be given prophylactic antibiotics in the form of  Ancef 1 g IV q.8h. DD:  08/31/99 TD:  09/02/99 Job: [id number]

## 2010-09-12 NOTE — Op Note (Signed)
[place]. [facility]  Patient:    [name], [name]                       MRN: [id number] Proc. Date: 05/28/00 Adm. Date:  [id number] Attending:  Lubertha South                           Operative Report  PREOPERATIVE DIAGNOSES:  Degenerative disk disease C4-5, and herniated nucleus pulposus, left C7-T1, with persistent left C8 radiculopathy.  POSTOPERATIVE DIAGNOSES: 1. Degenerative disk disease C3-4, with left C3-4 foraminal stenosis. 2. Herniated nucleus pulposus left C7-T1 with foraminal stenosis affecting the    left C8 nerve root.  PROCEDURE:  Anterior cervical diskectomy and fusion of C3-4 and C7-T1 using dense cancellous allograft bone graft and 28 mm plate at the B2-8 level, a 30 mm plate at the U1-L2 level, with four 4.0 screws each segment.  The Spinal Concept system was used.  SURGEON:  Kerrin Champagne, M.D.  ASSISTANT:  Javier Docker, M.D.  ANESTHESIA:  GOT, Judie Petit, M.D.  ESTIMATED BLOOD LOSS:  150 cc.  DRAINS:  None.  BRIEF CLINICAL HISTORY:  The patient is a 60 year old female who is status post three-level anterior cervical diskectomy and fusion in the past.  She has had persistent pain in the left neck and left shoulder over the last two years, has undergone foraminotomies above C6-7 and C7-T1 of the left side. These have been unsuccessful in relieving her discomfort in the left arm.  The patient complains of persistent headache.  She is increasing use of narcotic pain medicine.  She has EMG and nerve conduction studies that demonstrate left C8-T1 radiculopathy, MR study demonstrating degenerative changes at C3-4, and occlusion of the left C7-T1 neural foramen associated with disk protrusion and spurring.  She is brought to the operating room to undergo anterior cervical diskectomy and fusion in both these segments on either end of her three-level fusion.  INTRAOPERATIVE FINDINGS:  Left-sided foraminal stenosis at  C3-4, left-sided disk protrusion at C7-T1, and left-sided C8 nerve root compression.  DESCRIPTION OF PROCEDURE:  After adequate general anesthesia, the patient in a beach chair position, the head on the Mayfield horseshoe, well-padded. Cervical Holter traction of five pounds, small bump between the shoulder blades.  Standard prep with Duraprep solution, draped in the usual manner, an iodine-impregnated Vi-Drape, and the patient had Foley catheter placed prior to the beginning of the procedure.  The incision along the anterior aspect of the left sternocleidomastoid and oblique approximately 9 cm in length through the skin and subcutaneous layers using a 10 blade after infiltration of Marcaine 0.5% with 1:200,000 epinephrine.  The incision carried sharply through skin and subcutaneous layers and then through the platysma layer. Metzenbaum scissors were then used to carefully spread the subcutaneous layers medial to the sternocleidomastoid.  The carotid sheath identified.  Hyoid muscle identified, freed up, and divided to allow for exposure of the upper segments.  Sharp dissection then used to develop the interval anterior to the left carotid sheath at the expected C3-4 level, splitting the interval between this and the trachea and esophagus medially, to the anterior prevertebral fascial layer.  This was identified and the disk at the C3-4 level felt. Medial border of the longus colli muscle identified and bipolar electrocautery used to cauterize prevertebral fascia here, and this was then teased across the midline using Barista.  An 18-gauge spinal  needle was then inserted at the disk expected to be the C3-4 level.  Down below, the interval between the trachea and esophagus medially and the carotid sheath laterally was developed using blunt dissection to the anterior aspect of the cervical spine.  The expected disk space felt at this level had spurs anteriorly. Bovie electrocautery  used to cauterize the medial border of the longus colli muscle on the left side and then Kitner dissector used to tease the prevertebral fascia across the midline at this level.  A second 18-gauge needle was then inserted at this level.  Note that the sheath was left intact with both needles only allowing a centimeter of the needle to extend into the disk space at each segment.  Intraoperative radiograph then taken, which demonstrated the needle in C3-4 above and at C7-T1 below.  Under direct observation, the needle at C3-4 was then removed and a small portion of the anterior aspect of the disk excised using the 15 blade scalpel and pituitary rongeur.  This was similarly performed at the lower level at C7-T1.  Then the soft tissues were carefully freed up over the anterior aspect of the C7-T1 level and then using a Key elevator to tease the prevertebral fascia across the midline to free up the medial border of the longus colli muscle on both sides.  The McCullough retractor was placed with the foot of the retractor beneath the medial border of the longus colli muscle both sides.  Distraction then performed and excellent exposure obtained here.  The Caspar distraction system was inserted and a 14 mm screw passed into the vertebral bodies of C7 and T1 to allow for distraction of this disk space.  Anterior lip osteophytes were removed using Matt Holmes rongeurs.  High-speed bur also used.  The disk space was then curetted of disk material, and pituitary rongeurs as well as small microcurettes were used to carefully tease and remove the cartilaginous end plates and disk material remaining within the intervertebral disk space back to the posterior annular fibers, and these were resected using pituitary rongeur.  Both the superior and inferior lip osteophytes posteriorly were resected using 1 and 2 mm Kerrisons.  The left neural foramen at C7-T1 was opened widely using 1 and 2 mm Kerrison.  Disk material  was noted to be present extending into the superior aspect of the intervertebral joint and affecting the left C8 nerve root.  This was resected using 2 mm Kerrisons as  well as micro-nerve hook.  When this was done, the left C8 nerve root was found to be completely free.  Irrigation was performed.  The end plates were carefully burred to bleeding bony surfaces using high-speed 3 mm bur.  The height of the intervertebral disk space was noted at 7 mm.  A 7 mm cancellous bone graft was inserted and impacted into place.  Hemostasis had been obtained prior to insertion of this graft using thrombin-soaked Gelfoam.  Note that also care was taken to ensure there was no loose debris within the intervertebral disk space that could be retropulsed with insertion of graft. The graft was placed without difficulty.  Next, the screw posts were removed. Anterior lip osteophytes again carefully burred and smoothed to allow for acceptance of a plate across the anterior aspect of the cervical spine.  A 30 mm plate was chosen.  The plate was impacted across the intervertebral disk space and then each of the individual screw holes first drills and then appropriate size 14 mm, 4.0  self-tapping screw was fixed in the level quite nicely.  Attention was then turned to the C3-4 level.  Similarly, the anterior aspect of the intervertebral disk space was freed up using a Key elevator to free up the medial border of the longus colli muscle both sides. Self-retaining retractors were then inserted.  Smooth blade was used medially.  The Caspar distraction was inserted, 14 mm screw post placed in C3 vertebral body and one at C4.  The disk was further incised using a 15 blade scalpel to incise the anterior annular fibers.  Pituitary rongeurs, curettes then used to debride the intervertebral disk space.  The operating room microscope brought into the field, and it was used for the previous disk space as well, removing the  disk material, excising osteophytes, and performing foraminotomy on the left side.  The left C4 nerve root was foraminotomized and opened widely to the point where the nerve was completely free within its neural foramen.  With this, then, the end plates were carefully debrided to a bleeding bony surface using a high-speed drill.  The height of the intervertebral disk space was measured also at 7 mm and a 7 mm graft inserted into this level and impacted into place.  Anterior lip osteophytes were then resected and a 28 mm plate placed across the intervertebral disk space.  This was then impacted into place, and then each of the individual screw holes were carefully drilled and appropriate-sized 14 mm length screws, 4.0 width, self-tapping, were then placed in each segment.  Intraoperative radiograph obtained, which demonstrated the plates and screws in excellent position and alignment, grafts showing no sign of retropulsion.  Excellent maintenance of the cervical lordotic curve.  Irrigation was then performed over the anterior left side of the neck, carefully to both above and below.  Careful inspection demonstrated some small bleeders, which were controlled using bipolar electrocautery.  A TLS drain was then placed to allow for drainage of both the superior and inferior wounds over the anterior cervical spine area.  Next, the platysma layer was reapproximated with interrupted 3-0 Vicryl sutures, deep subcutaneous layers reapproximated with interrupted 3-0 Vicryl sutures, and the skin closed with a running subcuticular stitch of 4-0 Monocryl.  Tincture of benzoin and Steri-Strips applied.  The Monocryl stitch is a pull-out stitch.  Next, 4 x 4s were fixed to the skin with Hypafix tape and a Philadelphia collar applied.  The patient was then reactivated and returned to the recovery room in satisfactory condition. DD:  05/28/00 TD:  05/31/00 Job: [id number]

## 2010-09-12 NOTE — Op Note (Signed)
[place]. [facility]  Patient:    [name], [name]                       MRN: [id number] Proc. Date: 11/03/99 Adm. Date:  [id number] Attending:  Lubertha South                           Operative Report  PREOPERATIVE DIAGNOSIS:  Herniated nucleus pulposus, left C7-T1, with left C8 nerve root entrapment.  POSTOPERATIVE DIAGNOSIS:  Herniated nucleus pulposus, left C7-T1, with left C8 nerve root entrapment.  PROCEDURE:  Left C7-T1 [place] foraminotomy with decompression of the left C8 nerve root.  The microscope was used during the procedure.  SURGEON:  Kerrin Champagne, M.D.  ASSISTANT:  Illene Labrador. [place], M.D.  ANESTHESIA:  GOT, Burna Forts, M.D.  ESTIMATED BLOOD LOSS:  25 cc.  DRAINS:  None.  BRIEF CLINICAL HISTORY:  This patient is a 60 year old female who has undergone previous three-level anterior cervical diskectomy and fusion at C4-5, C5-6, and C6-7.  She has had a previous cervical foraminotomy on the left side for disk protrusion at the C6-7 level almost three years ago.  She did well following that surgery, now has had pain radiating into the left ulnar two digits for the past one year.  Follow-up MR study has demonstrated apparent disk protrusion with C8 nerve root compression on the left side at C7-T1.  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  The patient had undergone previous elective root block that was successful but only temporarily relieving her discomfort.  DESCRIPTION OF PROCEDURE:  After adequate general anesthesia, the patient in the prone position, chest rolls, tape used to hold the shoulders depressed. Shoulders well padded with ABDs beneath the tape.  The head on the [place] horseshoe with gel pad.  Neck in slight flexion.  The patient in slight reverse Trendelenburg.  Prep over the posterior aspect of the  neck from approximately C3 to T2 using Duraprep solution, draped in the usual manner, iodine-impregnated Vi-Drape.  The incision made in the midline through the old incision scar inferior one-half and continued caudally an additional 2 cm for a total of 3.5-4 cm in length through the skin and subcutaneous layers using a 10 blade scalpel after infiltration of the subcutaneous layers with Marcaine 0.5% with 1:200,000 epinephrine.  Incision carried sharply down to the ligamentum nuchal layer, and this was then incised along the left side of the spinous processes of C6, C7, and T1.  Markers in the form of towel clips were then placed over the spinous process of C6 and C7, intraoperative radiographs obtained, which demonstrated the markers on the spinous processes of C6 and C7.  A suture was placed at each of these levels to continue their identification throughout the procedure.  Electrocautery was then used to divide the ligamentum nuchal attachments to the spinous process at C7 and T1, and then two Cobbs were then used to elevate the erector spini muscles off the posterior aspect of the cervical lamina at C7 and T1.  The attachments of the erector spini muscle into the inferior aspect of the lamina of C7 were then divided using curved Mayo scissors.  Bleeders controlled using electrocautery. Two Cobbs were then used to elevate the muscle laterally, exposing the facet on the left side of  C7-T1.  A McCullough retractor was inserted, and excellent exposure was obtained.  Operating room microscope was then draped and brought into the field.  Under direct visualization with the operating microscope, a high-speed bur was then used to remove a small portion of the inferior aspect of the lamina of C7 laterally and the medial border of this inferior articular process at C7, approximately 20-25%.  Additionally, the superior articular process of T1 and the superior border laterally of the lamina of T1  was thinned using a high-speed bur such that a curette could be introduced and the inferior margin of C7 was then carefully excised using a curette so that a 1 mm or 2 mm Kerrison could be introduced.  Using the 1 mm Kerrison, then, partial lateral semihemilaminectomy was performed and medial facetectomy of the C7-T1 facet involving approximately 25-30% of the facet was performed.  A 2 mm Kerrison was also introduced and used to debride the upper border of the lamina of T1.  The ligamentum flavum was then able to be elevated and lifted using titanium nerve hook, bleeders controlled using bipolar electrocautery, epidural veins were cauterized and the flavum then excised at this level laterally.  The fibrovascular leash overlying the C8 nerve root was then identified and a titanium nerve hook used to elevate it off of the posterior aspect of the thecal sac and C8 nerve root was then identified and a titanium nerve hook used to elevate it off of the posterior aspect of the thecal sac and C8 nerve root.  Then bipolar electrocautery was used to cauterize this and divide it out laterally.  Careful inspection with the nerve hook demonstrated the neural foramen remained quite tight so that further extension of the facet was continued out to nearly 50% of the facet being excised laterally in order to decompress the C8 nerve root.  Following this, the nerve root could be probed easily both anterior to the nerve and posterior to the nerve exiting the foramen without compression.  The nerve did have the appearance of compression associated with the uncovertebral and facet joints approximating one another within the foramen.  A small portion of the superior aspect of the facet of T1 was resected to allow for further decompression of the nerve and allow for the nerve hook to be slipped beneath the nerve root to gauge whether there was any evidence of disk compression or herniated disk.  None was felt  to be present. With this, then, irrigation was performed.  A small portion of Gelfoam was felt to be necessary to cover the foraminotomy site and left in place.  There was no active bleeding present.  The soft  tissue was allowed to fall back into place, and the ligamentum nuchal layers were approximated with interrupted 0 Ethibond sutures.  Deep subcutaneous layers approximated with interrupted 2-0 Vicryl sutures, more superficial layers with interrupted 2-0 Vicryl sutures, and the skin closed with a running subcutaneous stitch of 4-0 Vicryl.  Tincture of benzoin and Steri-Strips applied, and 4 x 4s, fixed to the skin with Hypafix tape.  The patient then placed in a soft cervical collar.  She was returned to the supine position, extubated, and returned to the recovery room in satisfactory condition.  All instrument and sponge counts were correct. DD:  11/03/99 TD:  11/03/99 Job: [id number]

## 2010-09-12 NOTE — H&P (Signed)
[place]. [facility]  Patient:    [name], [name]                         MRN: [id number] Adm. Date:  05/28/00 Attending:  Kerrin Champagne, M.D. Dictator:   Dorie Rank, P.A.C. CC:         [name], M.D.  Kern Reap, M.D.   History and Physical  DATE OF BIRTH:  [birth year].  CHIEF COMPLAINT:  Neck pain with radiation into the left arm.  HISTORY OF PRESENT ILLNESS:  Ms. Bilger is a pleasant 60 year old white female who has had multiple surgeries to her cervical and lumbar spine.  She had an MRI in November of 2001.  The MRI showed that she had significant degenerative changes at C3, 3-4 above her fusion segment and also at C6 and 7. There is an apparent residual spur there in C7 through T1 on the left side. She has had previous fusion at C4-5, C5-6 and C6-7, three level fusion and previous left-sided foraminotomies at C6-7 and C7-T1.  She has had worsening pain and numbness to her ulnar aspect of the two digits of her left hand.  She has tried selective nerve root blocks which really did not help with her pain. She has had no real relief from the p.o. Vicodin and Baclofen.  The pain has been interfering with her activities of daily living and finally had her last appointment on April 12, 2000.  It was felt after reviewing the procedure and discussing the risks and benefits, that she should undergo anterior cervical discectomy and fusion of C3-4 and C6-T1. Risks and benefits were explained and at that point the patient agreed to proceed with the surgery. The patient was cleared medically by her cardiologist, [name], M.D., because of her history of mitral valve prolapse and SVT.  He felt that he could give her clearance for any potential neurosurgery or orthopedic surgery of her neck.  PAST MEDICAL HISTORY:  Significant for: 1. 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 2. History of pneumonia x 2. 3. Mitral valve  prolapse. 4. Supraventricular tachycardia - controlled with medication.  PAST SURGICAL HISTORY:  The patient has undergone a total of 11 surgeries to her neck including the above stated fusions, tonsillectomy as a child.  MEDICATIONS: 1. Atenolol 75 mg one p.o. q.d. 2. Verapamil 240 mg one q.h.s. 3. Vicodin 7.5 mg one to two p.o. q.4-6h. p.r.n. pain. 4. Baclofen 10 to 20 mg p.o. up to t.i.d. p.r.n. spasm. 5. Clonazepam 0.5 mg one p.o. q.h.s.  ALLERGIES:  The patient states that MORPHINE and DOLOPHINE cause severe itching and nausea.  She does not have any hives or a rash with this.  While taking CELEBREX and NAPROSYN she had extreme swelling of the extremities.  SOCIAL HISTORY:  The patient is married.  She has no children. She is on disability.  She is a nondrinker.  She smokes one half to one pack of cigarettes per day.  FAMILY HISTORY:  Mother deceased age 64, history of CHF, diabetes mellitus, renal failure and multiple MIs.  Father living at age 78, silent MI, history of Alzheimers disease.  REVIEW OF SYSTEMS:  GENERAL:  No fevers, chills, night sweats or bleeding tendencies.  CNS:  No blurry or double vision, seizures, headache, or paralysis.  RESPIRATORY:  No shortness of breath, productive cough or hemoptysis.  CARDIOVASCULAR:  She does have a history of  SVT, no chest pain, __________ or orthopnea.  GI:  Occasional constipation while on Vicodin.  No diarrhea, melena or bloody stools.  GU:  No dysuria, hematuria or discharge. MUSCULOSKELETAL:  No other joint pain noted besides that of her neck described on history of present illness.  No other joint erythema.  PHYSICAL EXAMINATION:  GENERAL APPEARANCE:  A 60 year old thin white female.  VITAL SIGNS:  Pulse 60, respiratory rate 16, blood pressure 110/70.  NECK:  Supple.  There are multiple scars about the neck from previous surgeries noted.  There is no carotid bruit bilaterally.  LUNGS:  Clear to auscultation  bilaterally.  No wheezing, rhonchi or rales.  CARDIOVASCULAR:  Not pertinent for present illness.  S1 and S2.  The heart is regular in rhythm.  Bradycardia noted.  Grade 2/6 systolic click heard best at the mitral area consistent with mitral valve prolapse.  GENITOURINARY:  Not pertinent for present illness.  ABDOMEN:  Soft and nontender.  Bowel sounds are positive in all four quadrants.  EXTREMITIES:  There is 2+ radial pulse.  Reflexes:  Left triceps 1+, right triceps 2+, left biceps 1+, right 2+; brachial radialis 1+ left, 2+ right. Cervical range of motion is dimension and extension, lateral bending and lateral rotation.  NEUROLOGICAL:  Extraocular movements intact.  PERRLA.  Skin is acyanotic and free of any lesions.  No clubbing noted.  LABS/X-RAYS:  Preoperative labs are pending at this time.  IMPRESSION:  Degenerative joint disease C3 through 4, herniated nucleus pulposus at  C7 through T1.  PLAN:  The patient is scheduled for anterior cervical discectomy and fusion at C3 through 4 and C7 through T1 with allograft and freeze dried tricolored ________ bone graft with plates and screws at [employer]. [facility].  The surgeon will be Kerrin Champagne, M.D., on May 28, 2000.   DD: 05/24/00 TD:  05/24/00 Job: [id number]

## 2011-01-21 ENCOUNTER — Inpatient Hospital Stay (HOSPITAL_COMMUNITY)
Admission: EM | Admit: 2011-01-21 | Discharge: 2011-01-26 | DRG: 193 | Disposition: A | Discharge status: Home / Self Care | Payer: Medicare Other | Attending: Internal Medicine | Admitting: Internal Medicine

## 2011-01-21 ENCOUNTER — Emergency Department (HOSPITAL_COMMUNITY): Payer: Medicare Other

## 2011-01-21 DIAGNOSIS — R7401 Elevation of levels of liver transaminase levels: Secondary | ICD-10-CM | POA: Diagnosis present

## 2011-01-21 DIAGNOSIS — R636 Underweight: Secondary | ICD-10-CM | POA: Diagnosis present

## 2011-01-21 DIAGNOSIS — I471 Supraventricular tachycardia, unspecified: Secondary | ICD-10-CM | POA: Diagnosis present

## 2011-01-21 DIAGNOSIS — F3289 Other specified depressive episodes: Secondary | ICD-10-CM | POA: Diagnosis present

## 2011-01-21 DIAGNOSIS — I10 Essential (primary) hypertension: Secondary | ICD-10-CM | POA: Diagnosis present

## 2011-01-21 DIAGNOSIS — Z681 Body mass index (BMI) 19 or less, adult: Secondary | ICD-10-CM

## 2011-01-21 DIAGNOSIS — J9600 Acute respiratory failure, unspecified whether with hypoxia or hypercapnia: Secondary | ICD-10-CM | POA: Diagnosis present

## 2011-01-21 DIAGNOSIS — F329 Major depressive disorder, single episode, unspecified: Secondary | ICD-10-CM | POA: Diagnosis present

## 2011-01-21 DIAGNOSIS — E785 Hyperlipidemia, unspecified: Secondary | ICD-10-CM | POA: Diagnosis present

## 2011-01-21 DIAGNOSIS — Z833 Family history of diabetes mellitus: Secondary | ICD-10-CM

## 2011-01-21 DIAGNOSIS — R7402 Elevation of levels of lactic acid dehydrogenase (LDH): Secondary | ICD-10-CM | POA: Diagnosis present

## 2011-01-21 DIAGNOSIS — Z888 Allergy status to other drugs, medicaments and biological substances status: Secondary | ICD-10-CM

## 2011-01-21 DIAGNOSIS — Z7982 Long term (current) use of aspirin: Secondary | ICD-10-CM

## 2011-01-21 DIAGNOSIS — Z79899 Other long term (current) drug therapy: Secondary | ICD-10-CM

## 2011-01-21 DIAGNOSIS — Z91038 Other insect allergy status: Secondary | ICD-10-CM

## 2011-01-21 DIAGNOSIS — Z87891 Personal history of nicotine dependence: Secondary | ICD-10-CM

## 2011-01-21 DIAGNOSIS — J154 Pneumonia due to other streptococci: Principal | ICD-10-CM | POA: Diagnosis present

## 2011-01-21 LAB — DIFFERENTIAL
Basophils Relative: 1 % (ref 0–1)
Eosinophils Relative: 1 % (ref 0–5)
Lymphs Abs: 1.8 10*3/uL (ref 0.7–4.0)
Monocytes Relative: 8 % (ref 3–12)
Neutro Abs: 6.4 10*3/uL (ref 1.7–7.7)

## 2011-01-21 LAB — CBC
HCT: 40 % (ref 36.0–46.0)
Hemoglobin: 12.9 g/dL (ref 12.0–15.0)
MCHC: 32.3 g/dL (ref 30.0–36.0)

## 2011-01-21 LAB — POCT I-STAT TROPONIN I

## 2011-01-21 LAB — COMPREHENSIVE METABOLIC PANEL
ALT: 51 U/L — ABNORMAL HIGH (ref 0–35)
AST: 45 U/L — ABNORMAL HIGH (ref 0–37)
CO2: 31 mEq/L (ref 19–32)
Chloride: 105 mEq/L (ref 96–112)
Creatinine, Ser: 0.67 mg/dL (ref 0.50–1.10)
GFR calc non Af Amer: >60 mL/min (ref >60)
Sodium: 143 mEq/L (ref 135–145)
Total Bilirubin: 0.3 mg/dL (ref 0.3–1.2)

## 2011-01-21 LAB — URINALYSIS, ROUTINE W REFLEX MICROSCOPIC
Nitrite: NEGATIVE
Protein, ur: 30 mg/dL — AB
Urobilinogen, UA: 0.2 mg/dL (ref 0.0–1.0)

## 2011-01-21 LAB — HEPATIC FUNCTION PANEL
ALT: 48 U/L — ABNORMAL HIGH (ref 0–35)
AST: 39 U/L — ABNORMAL HIGH (ref 0–37)
Albumin: 2.8 g/dL — ABNORMAL LOW (ref 3.5–5.2)
Alkaline Phosphatase: 105 U/L (ref 39–117)
Total Bilirubin: 0.3 mg/dL (ref 0.3–1.2)

## 2011-01-21 LAB — URINE MICROSCOPIC-ADD ON

## 2011-01-21 LAB — HIV ANTIBODY (ROUTINE TESTING W REFLEX): HIV: NONREACTIVE

## 2011-01-22 LAB — BASIC METABOLIC PANEL
BUN: 11 mg/dL (ref 6–23)
Creatinine, Ser: 0.57 mg/dL (ref 0.50–1.10)
Potassium: 4.1 mEq/L (ref 3.5–5.1)
Sodium: 145 mEq/L (ref 135–145)

## 2011-01-22 LAB — LEGIONELLA ANTIGEN, URINE

## 2011-01-22 LAB — EXPECTORATED SPUTUM ASSESSMENT W GRAM STAIN, RFLX TO RESP C

## 2011-01-22 LAB — CBC
MCHC: 32.6 g/dL (ref 30.0–36.0)
RDW: 13.9 % (ref 11.5–15.5)

## 2011-01-22 NOTE — H&P (Signed)
[name], [name]                ACCOUNT NO.:  [id number]  MEDICAL RECORD NO.:  [id number]  LOCATION:  [facility]                         FACILITY:  [facility]  PHYSICIAN:  Hartley Barefoot, MD    DATE OF BIRTH:  July 07, 1950  DATE OF ADMISSION:  01/21/2011 DATE OF DISCHARGE:                             HISTORY & PHYSICAL   CHIEF COMPLAINT:  Shortness of breath.  BRIEF HISTORY OF PRESENT ILLNESS:  This is a 60 year old with past medical history of paroxysmal ventricular tachycardia, history of depression, hyperlipidemia, intervertebral degenerative disk disease who presented to the emergency department referred by her primary care physician due to persistent cough, shortness of breath.  The patient was seen in the Urgent Care a week prior to admission due to fevers at 102 and sinus infection.  Subsequently, she developed left-sided pleurisy and she was referred to the emergency department.  She was seen at the hospital in [place] and she was discharged home on oxygen and Z-pak, that was Saturday 4 days prior to admission.  The patient followed with her primary care physician but she was still short of breath.  Her oxygen was 88 on ambulation and she was on oxygen 2 L.  She was referred for further evaluation.  The patient related earlier this week that she was coughing a small amount of blood, now the cough is better.  She is still short of breath.  She feels that the nebulizer treatment given in the ED improved her shortness of breath.  She is still having some chest pain when she coughs or take a deep breath.  She had a CT at [place] that was negative for PE.  ALLERGIES: 1. MORPHINE DERIVATIVE. 2. BEE STING. 3. WELLBUTRIN.  PAST MEDICAL HISTORY: 1. History of depression. 2. Hyperlipidemia. 3. Insomnia. 4. Multiple intervertebral disk disease. 5. Paroxysmal supraventricular tachycardia. 6. History of mitral valve prolapse.  FAMILY HISTORY:  History of diabetes.  SOCIAL  HISTORY:  Denied tobacco, she quit a year ago.  No recreational drugs.  She drinks socially 1-2 glasses of wine per week.  REVIEW OF SYSTEMS:  Negative except as per HPI.  MEDICATIONS: 1. Verapamil 240 mg p.o. daily. 2. Atenolol 25 mg p.o. b.i.d. 3. Lipitor 20 mg p.o. daily at bedtime. 4. Aspirin 325 p.o. daily. 5. Baclofen 20 mg b.i.d. 6. Hydrocodone and Tylenol 10/325 every 6 hours as needed. 7. Clonazepam 1 mg p.o. daily at bedtime. 8. Paroxetine 30 mg p.o. daily.  PHYSICAL EXAMINATION:  VITAL SIGNS:  Blood pressure 100/70, sat 98 on 2 L, respiration 18, and pulse 70s. GENERAL:  The patient is lying in bed and no acute distress is seen. HEENT:  Head is traumatic and normocephalic.  Eyes:  Anicteric.  Pupils equal and reactive to light.  Extraocular muscles intact. CARDIOVASCULAR:  S1-S2.  Regular rhythm and rate.  No rubs, murmurs, or gallops. LUNGS:  Bilateral good air movement.  No wheezing.  Left lung with crackles.  No rhonchi. ABDOMEN:  Soft, nontender, nondistended, and no rigidity.  Bowel sounds present. EXTREMITIES:  No edema. NEUROLOGIC:  Grossly nonfocal.  ADMISSION LABORATORY DATA:  Chest x-ray showed emphysema with superimposed left lower lobe pneumonia. Sodium 143, potassium  4.9, chloride 105, bicarb 31, glucose 81, BUN 16, and creatinine 0.67.  Alkaline phosphatase 102, AST 45, ALT 51, and albumin 2.8.  White blood cell 9.1, hemoglobin 12.9, hematocrit 40, and MCV 100.  ASSESSMENT AND PLAN: 1. Community-acquired pneumonia.  We will admit the patient to regular     bed.  We will start ceftriaxone and azithromycin.  We will get a     sputum culture, blood culture, and Legionella antigen. 2. Respiratory failure secondary to pneumonia.  The patient's     shortness of breath is better since she is in the emergency     department.  She is saturating 98 on 2 L.  Her respirations are in     the 18 range.  We will continue with nebulizer treatment for      pneumonia. 3. Transaminitis, probably secondary to Tylenol.  I will get HIV and     hepatitis panel. 4. History of paroxysmal supraventricular tachycardia.  We will hold     beta blockers and Cardizem due to soft systolic blood pressure.  We     will consider restarting in the morning. 5. Hypotension.  We will continue with IV fluids.  Her blood pressure     is in the 100 range.  The patient related that her blood pressure     is usually in the 100.  I will hold blood pressure medications.  I     will check lactic acid. 6. Deep venous thrombosis prophylaxis, sequential compression devices     at this time.  We will monitor for hemoptysis.     Hartley Barefoot, MD     BR/MEDQ  D:  01/21/2011  T:  01/21/2011  Job:  [id number]  Electronically Signed by Hartley Barefoot MD on 01/22/2011 08:32:56 PM

## 2011-01-23 LAB — COMPREHENSIVE METABOLIC PANEL
Alkaline Phosphatase: 84 U/L (ref 39–117)
BUN: 8 mg/dL (ref 6–23)
CO2: 30 mEq/L (ref 19–32)
Chloride: 107 mEq/L (ref 96–112)
GFR calc Af Amer: >60 mL/min (ref >60)
GFR calc non Af Amer: >60 mL/min (ref >60)
Glucose, Bld: 110 mg/dL — ABNORMAL HIGH (ref 70–99)
Potassium: 3.5 mEq/L (ref 3.5–5.1)
Total Bilirubin: 0.2 mg/dL — ABNORMAL LOW (ref 0.3–1.2)

## 2011-01-23 LAB — CBC
HCT: 36.4 % (ref 36.0–46.0)
Hemoglobin: 11.9 g/dL — ABNORMAL LOW (ref 12.0–15.0)
WBC: 6.7 10*3/uL (ref 4.0–10.5)

## 2011-01-23 LAB — HEPATITIS PANEL, ACUTE
HCV Ab: NEGATIVE
Hep A IgM: NEGATIVE

## 2011-01-23 LAB — LACTIC ACID, PLASMA: Lactic Acid, Venous: 2 mmol/L (ref 0.5–2.2)

## 2011-01-24 LAB — BASIC METABOLIC PANEL
CO2: 29 mEq/L (ref 19–32)
Calcium: 9.4 mg/dL (ref 8.4–10.5)
Creatinine, Ser: 0.51 mg/dL (ref 0.50–1.10)
GFR calc non Af Amer: >60 mL/min (ref >60)
Glucose, Bld: 91 mg/dL (ref 70–99)
Sodium: 144 mEq/L (ref 135–145)

## 2011-01-24 LAB — CBC
Hemoglobin: 12.4 g/dL (ref 12.0–15.0)
MCH: 32.7 pg (ref 26.0–34.0)
MCHC: 33.1 g/dL (ref 30.0–36.0)
MCV: 98.9 fL (ref 78.0–100.0)
Platelets: 371 10*3/uL (ref 150–400)
RBC: 3.79 MIL/uL — ABNORMAL LOW (ref 3.87–5.11)

## 2011-01-25 LAB — CULTURE, RESPIRATORY W GRAM STAIN

## 2011-01-28 LAB — CULTURE, BLOOD (ROUTINE X 2)
Culture  Setup Time: 201209270151
Culture: NO GROWTH

## 2011-02-02 NOTE — Discharge Summary (Signed)
[name], [name]                ACCOUNT NO.:  [id number]  MEDICAL RECORD NO.:  [id number]  LOCATION:  3035                         FACILITY:  [facility]  PHYSICIAN:  Hartley Barefoot, MD    DATE OF BIRTH:  03-Jun-1950  DATE OF ADMISSION:  01/21/2011 DATE OF DISCHARGE:  01/26/2011                              DISCHARGE SUMMARY   DISCHARGE DIAGNOSES: 1. Community-acquired pneumonia probably strep, the patient failed     outpatient therapy Z-Pak. 2. Acute respiratory failure secondary to pneumonia, now stable. 3. Transaminitis probably secondary to medication.  Liver function     tests normalized. 4. Paroxysmal supraventricular tachycardia. 5. Hypertension, likely chronic.  DISCHARGE MEDICATIONS: 1. Albuterol inhaler 90 mcg q.6 hours as needed for shortness of     breath. 2. Atrovent 1 puff b.i.d. 3. Guaifenesin 600 mg p.o. b.i.d. p.r.n. 4. Avelox 400 mg p.o. daily. 5. Sertraline 100 mg p.o. daily. 6. Sodium chloride 0.65 one spray as needed. 7. Hydrocodone 10/325 every 6 hours as needed. 8. Align 1 tablet daily. 9. Aspirin 325 p.o. daily. 10.Baclofen 20 mg three times a day as needed. 11.Clonazepam 1 mg p.o. daily at bedtime. 12.Lipitor 20 mg at bedtime. 13.Metamucil one pack three times a day. 14.Verapamil 240 one tablet daily at bedtime.  MEDICATIONS STOPPED:  Atenolol.  DISPOSITION AND FOLLOWUP:  The patient will need to follow with her primary care physician within 1 or 2 days to follow blood pressure and heart rate and resolution and improvement of her respiratory symptoms.  BRIEF HISTORY OF PRESENT ILLNESS:  This is a 60 year old that presented with shortness of breath, treated with Z-Pak as an outpatient, failed treatment.  Please for further details, refer to HPI.  Chest x-ray showed emphysema with superimposed left lower lobe pneumonia.  Recommend followup for clearing.  HOSPITAL COURSE: 1. Community-acquired pneumonia.  The patient failed outpatient  treatment.  X-ray showed consistent with pneumonia.  Recommend to     have a followup x-ray to show resolution in a month or so.     Respiratory culture, gram positive cocci in pairs.  Lactic acid was     2.0.  Initial lactic acid was 2.8, mildly elevated then decreased     to 2.0 and normalized.  Legionella was negative.  HIV negative.     Over course of the hospitalization, the patient improved.  She was     treated with ceftriaxone for 3 days.  She was on Avelox.  The     patient's respiratory status has improved.  She was saturating 87%     on room air on ambulation, she would require home oxygen.  She will     need assessment for requirement of oxygen.  The patient's x-ray     showed emphysema.  She will need pulmonary function tests. 2. Transaminases.  The patient had mild elevated liver function tests.     On admission, AST of 45, ALT of 51.  I suspect this is medication     related.  Her liver function tests normalized during this     hospitalization.  HIV was negative.  Hepatitis panel was negative. 3. Hypotension.  The patient seems to be having  chronic hypotension.     Her systolic blood pressure is usually in the 100 range.  This is     maybe normal for her.  Initially, she was given IV fluids.  Her     blood pressure has been in the 100 range. 4. History of SVT.  The patient was on verapamil and beta-blocker at     home.  On admission, these medications were on hold due to concern     for sepsis, pneumonia, and soft systolic blood pressure.  Then     atenolol was ordered, but the patient did not receive the atenolol     because her blood pressure was less than 100.  The patient's heart     rate at resting was in the 79-80.  She was evaluated on ambulation     and her heart rate increased to the 130s.  She was without oxygen.     The patient took verapamil dose the day of discharge.     She relates that usually her blood pressure runs in the 100s even     without taking her  verapamil and she usually has low blood     pressure.  I will discharge her off the atenolol.  She took a dose     of verapamil and she wants to go home tonight.  I offered for her     to stay overnight, so we can check her blood pressure, but she     wants to go home and she affirmed that usually her blood pressure     is in the 90s and 100s, even taking her verapamil.  I will     let her go with close followup.  We checked her blood pressure 2-3     hours after she took the verapamil and her blood pressure was     103/52.  She will need to follow with her primary care physician.     She was advised to return if her blood pressure decreased.  The     patient was discharged in a stable condition.  She was feeling     fine.  She was asymptomatic.  Her shortness of breath has improved.     Hartley Barefoot, MD     BR/MEDQ  D:  01/26/2011  T:  01/26/2011  Job:  [id number]  Electronically Signed by Hartley Barefoot MD on 02/02/2011 07:19:11 PM

## 2011-02-04 LAB — COMPREHENSIVE METABOLIC PANEL
ALT: 24
AST: 25
AST: 26
CO2: 32
Calcium: 8.9
Chloride: 100
Creatinine, Ser: 0.59
GFR calc Af Amer: >60
GFR calc Af Amer: >60
GFR calc non Af Amer: >60
Glucose, Bld: 91
Sodium: 139
Total Bilirubin: 0.5
Total Protein: 5.8 — ABNORMAL LOW

## 2011-02-04 LAB — DIFFERENTIAL
Eosinophils Absolute: 0.3
Eosinophils Relative: 3
Lymphs Abs: 1.8
Monocytes Absolute: 0.8 — ABNORMAL HIGH
Monocytes Relative: 8

## 2011-02-04 LAB — URINALYSIS, ROUTINE W REFLEX MICROSCOPIC
Bilirubin Urine: NEGATIVE
Ketones, ur: NEGATIVE
Nitrite: NEGATIVE
Protein, ur: NEGATIVE
Urobilinogen, UA: 1

## 2011-02-04 LAB — CBC
HCT: 36.2
Hemoglobin: 12.1
MCHC: 33.3
MCV: 95.5
RBC: 3.48 — ABNORMAL LOW
RBC: 3.79 — ABNORMAL LOW
RDW: 13
WBC: 9.9

## 2011-02-04 LAB — URINALYSIS, MICROSCOPIC ONLY
Glucose, UA: NEGATIVE
Hgb urine dipstick: NEGATIVE
Ketones, ur: NEGATIVE
Protein, ur: NEGATIVE
Urobilinogen, UA: 1

## 2011-02-04 LAB — URINE CULTURE
Colony Count: NO GROWTH
Colony Count: NO GROWTH
Culture: NO GROWTH

## 2011-02-04 LAB — CORTISOL: Cortisol, Plasma: 8.2

## 2011-02-05 LAB — BASIC METABOLIC PANEL
Calcium: 10.2
Creatinine, Ser: 0.8
GFR calc non Af Amer: >60
Glucose, Bld: 89
Sodium: 140

## 2011-02-05 LAB — APTT
aPTT: 37
aPTT: 25

## 2011-02-05 LAB — CBC
Hemoglobin: 15.3 — ABNORMAL HIGH
MCHC: 33.4
MCV: 96.4
Platelets: 384
Platelets: 242
RDW: 12.8

## 2011-02-05 LAB — COMPREHENSIVE METABOLIC PANEL
AST: 21
Albumin: 2.5 — ABNORMAL LOW
CO2: 30
Calcium: 8.9
Creatinine, Ser: 0.74
GFR calc Af Amer: >60
GFR calc non Af Amer: >60

## 2011-02-05 LAB — PROTIME-INR
INR: 0.9
Prothrombin Time: 12.4

## 2011-02-05 LAB — SAMPLE TO BLOOD BANK

## 2011-03-21 ENCOUNTER — Other Ambulatory Visit: Payer: Self-pay | Admitting: Internal Medicine

## 2011-04-28 DIAGNOSIS — Z9189 Other specified personal risk factors, not elsewhere classified: Secondary | ICD-10-CM

## 2011-04-28 HISTORY — DX: Other specified personal risk factors, not elsewhere classified: Z91.89

## 2011-04-29 ENCOUNTER — Other Ambulatory Visit: Payer: Self-pay | Admitting: Anesthesiology

## 2011-04-29 DIAGNOSIS — R52 Pain, unspecified: Secondary | ICD-10-CM

## 2011-05-01 ENCOUNTER — Ambulatory Visit
Admission: RE | Admit: 2011-05-01 | Discharge: 2011-05-01 | Disposition: A | Discharge status: Home / Self Care | Payer: BC Managed Care – PPO | Source: Ambulatory Visit | Attending: Anesthesiology | Admitting: Anesthesiology

## 2011-05-01 DIAGNOSIS — R52 Pain, unspecified: Secondary | ICD-10-CM

## 2011-05-01 DIAGNOSIS — M5030 Other cervical disc degeneration, unspecified cervical region: Secondary | ICD-10-CM | POA: Diagnosis not present

## 2011-05-01 DIAGNOSIS — M5020 Other cervical disc displacement, unspecified cervical region: Secondary | ICD-10-CM | POA: Diagnosis not present

## 2011-05-01 DIAGNOSIS — R209 Unspecified disturbances of skin sensation: Secondary | ICD-10-CM | POA: Diagnosis not present

## 2011-05-01 MED ORDER — GADOBENATE DIMEGLUMINE 529 MG/ML IV SOLN
9.0000 mL | Freq: Once | INTRAVENOUS | Status: AC | PRN
  Administered 2011-05-01: 9 mL via INTRAVENOUS

## 2011-05-07 DIAGNOSIS — M4714 Other spondylosis with myelopathy, thoracic region: Secondary | ICD-10-CM | POA: Diagnosis not present

## 2011-06-04 ENCOUNTER — Other Ambulatory Visit: Payer: Self-pay | Admitting: Obstetrics and Gynecology

## 2011-06-04 DIAGNOSIS — Z1231 Encounter for screening mammogram for malignant neoplasm of breast: Secondary | ICD-10-CM

## 2011-06-24 ENCOUNTER — Ambulatory Visit: Payer: Medicare Other

## 2011-06-29 DIAGNOSIS — R002 Palpitations: Secondary | ICD-10-CM | POA: Diagnosis not present

## 2011-06-29 DIAGNOSIS — E782 Mixed hyperlipidemia: Secondary | ICD-10-CM | POA: Diagnosis not present

## 2011-06-29 DIAGNOSIS — I498 Other specified cardiac arrhythmias: Secondary | ICD-10-CM | POA: Diagnosis not present

## 2011-06-29 DIAGNOSIS — I6529 Occlusion and stenosis of unspecified carotid artery: Secondary | ICD-10-CM | POA: Diagnosis not present

## 2011-07-07 DIAGNOSIS — Z79899 Other long term (current) drug therapy: Secondary | ICD-10-CM | POA: Diagnosis not present

## 2011-07-07 DIAGNOSIS — R0609 Other forms of dyspnea: Secondary | ICD-10-CM | POA: Diagnosis not present

## 2011-07-07 DIAGNOSIS — G4761 Periodic limb movement disorder: Secondary | ICD-10-CM | POA: Diagnosis not present

## 2011-07-07 DIAGNOSIS — R002 Palpitations: Secondary | ICD-10-CM | POA: Diagnosis not present

## 2011-07-07 DIAGNOSIS — G479 Sleep disorder, unspecified: Secondary | ICD-10-CM | POA: Diagnosis not present

## 2011-07-07 DIAGNOSIS — R0989 Other specified symptoms and signs involving the circulatory and respiratory systems: Secondary | ICD-10-CM | POA: Diagnosis not present

## 2011-07-07 DIAGNOSIS — E782 Mixed hyperlipidemia: Secondary | ICD-10-CM | POA: Diagnosis not present

## 2011-07-07 DIAGNOSIS — R404 Transient alteration of awareness: Secondary | ICD-10-CM | POA: Diagnosis not present

## 2011-07-07 DIAGNOSIS — E559 Vitamin D deficiency, unspecified: Secondary | ICD-10-CM | POA: Diagnosis not present

## 2011-07-30 DIAGNOSIS — G890 Central pain syndrome: Secondary | ICD-10-CM | POA: Diagnosis not present

## 2011-07-30 DIAGNOSIS — M961 Postlaminectomy syndrome, not elsewhere classified: Secondary | ICD-10-CM | POA: Diagnosis not present

## 2011-07-30 DIAGNOSIS — M47812 Spondylosis without myelopathy or radiculopathy, cervical region: Secondary | ICD-10-CM | POA: Diagnosis not present

## 2011-07-30 DIAGNOSIS — M47817 Spondylosis without myelopathy or radiculopathy, lumbosacral region: Secondary | ICD-10-CM | POA: Diagnosis not present

## 2011-09-10 DIAGNOSIS — Z124 Encounter for screening for malignant neoplasm of cervix: Secondary | ICD-10-CM | POA: Diagnosis not present

## 2011-09-10 DIAGNOSIS — Z01419 Encounter for gynecological examination (general) (routine) without abnormal findings: Secondary | ICD-10-CM | POA: Diagnosis not present

## 2011-09-10 DIAGNOSIS — Z0000 Encounter for general adult medical examination without abnormal findings: Secondary | ICD-10-CM | POA: Diagnosis not present

## 2011-09-18 DIAGNOSIS — N390 Urinary tract infection, site not specified: Secondary | ICD-10-CM | POA: Diagnosis not present

## 2011-09-18 DIAGNOSIS — R300 Dysuria: Secondary | ICD-10-CM | POA: Diagnosis not present

## 2011-09-18 DIAGNOSIS — N952 Postmenopausal atrophic vaginitis: Secondary | ICD-10-CM | POA: Diagnosis not present

## 2011-09-25 ENCOUNTER — Encounter (INDEPENDENT_AMBULATORY_CARE_PROVIDER_SITE_OTHER): Payer: Self-pay | Admitting: Surgery

## 2011-09-29 ENCOUNTER — Encounter (INDEPENDENT_AMBULATORY_CARE_PROVIDER_SITE_OTHER): Payer: Self-pay | Admitting: Surgery

## 2011-10-01 ENCOUNTER — Encounter (INDEPENDENT_AMBULATORY_CARE_PROVIDER_SITE_OTHER): Payer: Self-pay | Admitting: Surgery

## 2011-10-01 ENCOUNTER — Ambulatory Visit (INDEPENDENT_AMBULATORY_CARE_PROVIDER_SITE_OTHER): Payer: Medicare Other | Admitting: Surgery

## 2011-10-01 VITALS — BP 86/64 | HR 75 | Temp 96.9°F | Ht 68.0 in | Wt 117.2 lb

## 2011-10-01 DIAGNOSIS — R599 Enlarged lymph nodes, unspecified: Secondary | ICD-10-CM | POA: Diagnosis not present

## 2011-10-01 DIAGNOSIS — R590 Localized enlarged lymph nodes: Secondary | ICD-10-CM

## 2011-10-01 NOTE — Progress Notes (Signed)
Patient ID: [name], female   DOB: [birth year], 61 y.o.   MRN: [id number]  Chief Complaint  Patient presents with  . Pre-op Exam    eval knot Lt groin area    HPI [name] is a 61 y.o. female. This is a very pleasant female referred by Dr. Ambrose Mantle for evaluation of left inguinal adenopathy. The patient noticed a small bulge or knot in the left groin at least 6 months ago. It is not tender or painful it is slowly getting larger. She denies any trauma to her extremities. She denies any other nodules. She has no constitutional symptoms like night sweats or fevers. She otherwise feels very well HPI  Past Medical History  Diagnosis Date  . Anxiety   . Arthritis   . Depression   . PSVT (paroxysmal supraventricular tachycardia)   . Heart murmur     Past Surgical History  Procedure Date  . Back surgery     14 back surgery  . Tubal ligation   . Breast surgery 1976    breast reduction  . Cervical disc surgery   . Colectomy   . Abdominal hysterectomy   . Knee surgery   . Tonsillectomy   . Spine surgery 1983-01/2007    Family History  Problem Relation Age of Onset  . COPD Father     Social History History  Substance Use Topics  . Smoking status: Former Games developer  . Smokeless tobacco: Former Neurosurgeon    Quit date: 09/30/2008  . Alcohol Use: 0.0 oz/week    3-4 Glasses of wine per week     weekly    Allergies  Allergen Reactions  . Bupropion Hcl   . Morphine     Current Outpatient Prescriptions  Medication Sig Dispense Refill  . aspirin 81 MG tablet Take 81 mg by mouth daily.      . ATENOLOL PO Take by mouth.      . Atorvastatin Calcium (LIPITOR PO) Take by mouth.      Marland Kitchen BACLOFEN PO Take by mouth.      . Calcium Carbonate-Vitamin D (CALCIUM + D PO) Take by mouth.      . chlorhexidine (PERIDEX) 0.12 % solution       . CLONAZEPAM PO Take by mouth.      . fish oil-omega-3 fatty acids 1000 MG capsule Take 2 g by mouth daily.      . Hydrocodone-Acetaminophen  (VICODIN PO) Take by mouth.      . Magnesium Hydroxide (MAGNESIA PO) Take by mouth.      . Sertraline HCl (ZOLOFT PO) Take by mouth.      . VERAPAMIL HCL PO Take by mouth.        Review of Systems Review of Systems  Constitutional: Negative for fever, chills and unexpected weight change.  HENT: Negative for hearing loss, congestion, sore throat, trouble swallowing and voice change.   Eyes: Negative for visual disturbance.  Respiratory: Negative for cough and wheezing.   Cardiovascular: Negative for chest pain, palpitations and leg swelling.  Gastrointestinal: Negative for nausea, vomiting, abdominal pain, diarrhea, constipation, blood in stool, abdominal distention and anal bleeding.  Genitourinary: Negative for hematuria, vaginal bleeding and difficulty urinating.  Musculoskeletal: Negative for arthralgias.  Skin: Negative for rash and wound.  Neurological: Negative for seizures, syncope and headaches.  Hematological: Negative for adenopathy. Does not bruise/bleed easily.  Psychiatric/Behavioral: Negative for confusion.    Blood pressure 86/64, pulse 75, temperature 96.9 F (36.1 C), temperature source  Temporal, height 5\' 8"  (1.727 m), weight 117 lb 3.2 oz (53.162 kg), SpO2 96.00%.  Physical Exam Physical Exam  Constitutional: She is oriented to person, place, and time. She appears well-developed and well-nourished. No distress.  HENT:  Head: Normocephalic and atraumatic.  Right Ear: External ear normal.  Left Ear: External ear normal.  Nose: Nose normal.  Mouth/Throat: Oropharynx is clear and moist. No oropharyngeal exudate.  Eyes: Conjunctivae are normal. Pupils are equal, round, and reactive to light. Right eye exhibits no discharge. Left eye exhibits no discharge. No scleral icterus.  Neck: Normal range of motion. Neck supple. No tracheal deviation present. No thyromegaly present.  Cardiovascular: Normal rate, regular rhythm, normal heart sounds and intact distal pulses.     No murmur heard. Pulmonary/Chest: Effort normal and breath sounds normal. No respiratory distress. She has no wheezes. She has no rales.  Abdominal: Soft. Bowel sounds are normal. She exhibits no distension. There is no tenderness. There is no rebound.  Musculoskeletal: Normal range of motion. She exhibits no edema and no tenderness.  Lymphadenopathy:    She has no cervical adenopathy.    She has no axillary adenopathy.       Right: Inguinal adenopathy present.       Left: Inguinal adenopathy present.       There is a very small lymph node which is mobile in the right groin. There is a much larger almost 1 cm mobile lymph node in the left groin.  Neurological: She is oriented to person, place, and time.  Skin: Skin is warm and dry. No rash noted. She is not diaphoretic. No erythema.  Psychiatric: Her behavior is normal. Judgment normal.    Data Reviewed   Assessment    Left inguinal lymphadenopathy    Plan    As it is getting larger and starting to become symptomatic, removal is recommended for histologic evaluation to rule out malignancy. I discussed this with her in detail. I discussed the risks of surgery which includes but is not limited to bleeding, infection, seroma formation, chronic pain, injury to other structures, need for further surgery, et [name]. She understands and wishes to proceed. Likelihood of success is good       Mikeisha Lemonds A 10/01/2011, 2:08 PM

## 2011-10-02 DIAGNOSIS — N952 Postmenopausal atrophic vaginitis: Secondary | ICD-10-CM | POA: Diagnosis not present

## 2011-10-02 DIAGNOSIS — N3090 Cystitis, unspecified without hematuria: Secondary | ICD-10-CM | POA: Diagnosis not present

## 2011-10-02 DIAGNOSIS — R300 Dysuria: Secondary | ICD-10-CM | POA: Diagnosis not present

## 2011-10-15 ENCOUNTER — Telehealth (INDEPENDENT_AMBULATORY_CARE_PROVIDER_SITE_OTHER): Payer: Self-pay | Admitting: General Surgery

## 2011-10-15 NOTE — Telephone Encounter (Signed)
Pt has general questions about the lymph node enlargement.  We discussed the role of the lymph system in general and several causes of them becoming enlarged.  She reports two others in her groin are now noticeable.  Emotional support offered, as she is now worried about the biopsy revealing lymphoma.  Pt instructed to call if any other questions arise.

## 2011-10-22 DIAGNOSIS — M47817 Spondylosis without myelopathy or radiculopathy, lumbosacral region: Secondary | ICD-10-CM | POA: Diagnosis not present

## 2011-10-22 DIAGNOSIS — G890 Central pain syndrome: Secondary | ICD-10-CM | POA: Diagnosis not present

## 2011-10-22 DIAGNOSIS — M47812 Spondylosis without myelopathy or radiculopathy, cervical region: Secondary | ICD-10-CM | POA: Diagnosis not present

## 2011-10-22 DIAGNOSIS — M961 Postlaminectomy syndrome, not elsewhere classified: Secondary | ICD-10-CM | POA: Diagnosis not present

## 2011-10-27 ENCOUNTER — Encounter (HOSPITAL_BASED_OUTPATIENT_CLINIC_OR_DEPARTMENT_OTHER): Payer: Self-pay | Admitting: *Deleted

## 2011-10-27 NOTE — Progress Notes (Signed)
Pt will need istat- Bring all meds

## 2011-10-29 NOTE — H&P (Signed)
Patient ID: [name], female DOB: 1950-12-29, 61 y.o. MRN: [id number]  Chief Complaint   Patient presents with   .  Pre-op Exam     eval knot Lt groin area    HPI  [name] is a 61 y.o. female. This is a very pleasant female referred by Dr. Ambrose Mantle for evaluation of left inguinal adenopathy. The patient noticed a small bulge or knot in the left groin at least 6 months ago. It is not tender or painful it is slowly getting larger. She denies any trauma to her extremities. She denies any other nodules. She has no constitutional symptoms like night sweats or fevers. She otherwise feels very well  HPI  Past Medical History   Diagnosis  Date   .  Anxiety    .  Arthritis    .  Depression    .  PSVT (paroxysmal supraventricular tachycardia)    .  Heart murmur     Past Surgical History   Procedure  Date   .  Back surgery      14 back surgery   .  Tubal ligation    .  Breast surgery  1976     breast reduction   .  Cervical disc surgery    .  Colectomy    .  Abdominal hysterectomy    .  Knee surgery    .  Tonsillectomy    .  Spine surgery  1983-01/2007    Family History   Problem  Relation  Age of Onset   .  COPD  Father     Social History  History   Substance Use Topics   .  Smoking status:  Former Games developer   .  Smokeless tobacco:  Former Neurosurgeon     Quit date:  09/30/2008   .  Alcohol Use:  0.0 oz/week     3-4 Glasses of wine per week      weekly    Allergies   Allergen  Reactions   .  Bupropion Hcl    .  Morphine     Current Outpatient Prescriptions   Medication  Sig  Dispense  Refill   .  aspirin 81 MG tablet  Take 81 mg by mouth daily.     .  ATENOLOL PO  Take by mouth.     .  Atorvastatin Calcium (LIPITOR PO)  Take by mouth.     Marland Kitchen  BACLOFEN PO  Take by mouth.     .  Calcium Carbonate-Vitamin D (CALCIUM + D PO)  Take by mouth.     .  chlorhexidine (PERIDEX) 0.12 % solution      .  CLONAZEPAM PO  Take by mouth.     .  fish oil-omega-3 fatty acids 1000 MG  capsule  Take 2 g by mouth daily.     .  Hydrocodone-Acetaminophen (VICODIN PO)  Take by mouth.     .  Magnesium Hydroxide (MAGNESIA PO)  Take by mouth.     .  Sertraline HCl (ZOLOFT PO)  Take by mouth.     .  VERAPAMIL HCL PO  Take by mouth.      Review of Systems  Review of Systems  Constitutional: Negative for fever, chills and unexpected weight change.  HENT: Negative for hearing loss, congestion, sore throat, trouble swallowing and voice change.  Eyes: Negative for visual disturbance.  Respiratory: Negative for cough and wheezing.  Cardiovascular: Negative for chest pain, palpitations and  leg swelling.  Gastrointestinal: Negative for nausea, vomiting, abdominal pain, diarrhea, constipation, blood in stool, abdominal distention and anal bleeding.  Genitourinary: Negative for hematuria, vaginal bleeding and difficulty urinating.  Musculoskeletal: Negative for arthralgias.  Skin: Negative for rash and wound.  Neurological: Negative for seizures, syncope and headaches.  Hematological: Negative for adenopathy. Does not bruise/bleed easily.  Psychiatric/Behavioral: Negative for confusion.   Blood pressure 86/64, pulse 75, temperature 96.9 F (36.1 C), temperature source Temporal, height 5\' 8"  (1.727 m), weight 117 lb 3.2 oz (53.162 kg), SpO2 96.00%.  Physical Exam  Physical Exam  Constitutional: She is oriented to person, place, and time. She appears well-developed and well-nourished. No distress.  HENT:  Head: Normocephalic and atraumatic.  Right Ear: External ear normal.  Left Ear: External ear normal.  Nose: Nose normal.  Mouth/Throat: Oropharynx is clear and moist. No oropharyngeal exudate.  Eyes: Conjunctivae are normal. Pupils are equal, round, and reactive to light. Right eye exhibits no discharge. Left eye exhibits no discharge. No scleral icterus.  Neck: Normal range of motion. Neck supple. No tracheal deviation present. No thyromegaly present.  Cardiovascular: Normal rate,  regular rhythm, normal heart sounds and intact distal pulses.  No murmur heard.  Pulmonary/Chest: Effort normal and breath sounds normal. No respiratory distress. She has no wheezes. She has no rales.  Abdominal: Soft. Bowel sounds are normal. She exhibits no distension. There is no tenderness. There is no rebound.  Musculoskeletal: Normal range of motion. She exhibits no edema and no tenderness.  Lymphadenopathy:  She has no cervical adenopathy.  She has no axillary adenopathy.  Right: Inguinal adenopathy present.  Left: Inguinal adenopathy present.  There is a very small lymph node which is mobile in the right groin. There is a much larger almost 1 cm mobile lymph node in the left groin.  Neurological: She is oriented to person, place, and time.  Skin: Skin is warm and dry. No rash noted. She is not diaphoretic. No erythema.  Psychiatric: Her behavior is normal. Judgment normal.   Data Reviewed  Assessment   Left inguinal lymphadenopathy   Plan   As it is getting larger and starting to become symptomatic, removal is recommended for histologic evaluation to rule out malignancy. I discussed this with her in detail. I discussed the risks of surgery which includes but is not limited to bleeding, infection, seroma formation, chronic pain, injury to other structures, need for further surgery, et [name]. She understands and wishes to proceed. Likelihood of success is good   [name]

## 2011-10-30 ENCOUNTER — Encounter (HOSPITAL_BASED_OUTPATIENT_CLINIC_OR_DEPARTMENT_OTHER): Payer: Self-pay | Admitting: *Deleted

## 2011-10-30 ENCOUNTER — Encounter (HOSPITAL_BASED_OUTPATIENT_CLINIC_OR_DEPARTMENT_OTHER)
Admission: RE | Disposition: A | Discharge status: Home / Self Care | Payer: Self-pay | Source: Ambulatory Visit | Attending: Surgery

## 2011-10-30 ENCOUNTER — Encounter (HOSPITAL_BASED_OUTPATIENT_CLINIC_OR_DEPARTMENT_OTHER): Payer: Self-pay | Admitting: Anesthesiology

## 2011-10-30 ENCOUNTER — Ambulatory Visit (HOSPITAL_BASED_OUTPATIENT_CLINIC_OR_DEPARTMENT_OTHER)
Admission: RE | Admit: 2011-10-30 | Discharge: 2011-10-30 | Disposition: A | Discharge status: Home / Self Care | Payer: Medicare Other | Source: Ambulatory Visit | Attending: Surgery | Admitting: Surgery

## 2011-10-30 ENCOUNTER — Ambulatory Visit (HOSPITAL_BASED_OUTPATIENT_CLINIC_OR_DEPARTMENT_OTHER): Payer: Medicare Other | Admitting: Anesthesiology

## 2011-10-30 DIAGNOSIS — I471 Supraventricular tachycardia, unspecified: Secondary | ICD-10-CM | POA: Insufficient documentation

## 2011-10-30 DIAGNOSIS — J4489 Other specified chronic obstructive pulmonary disease: Secondary | ICD-10-CM | POA: Insufficient documentation

## 2011-10-30 DIAGNOSIS — J449 Chronic obstructive pulmonary disease, unspecified: Secondary | ICD-10-CM | POA: Insufficient documentation

## 2011-10-30 DIAGNOSIS — I059 Rheumatic mitral valve disease, unspecified: Secondary | ICD-10-CM | POA: Insufficient documentation

## 2011-10-30 DIAGNOSIS — F411 Generalized anxiety disorder: Secondary | ICD-10-CM | POA: Diagnosis not present

## 2011-10-30 DIAGNOSIS — R599 Enlarged lymph nodes, unspecified: Secondary | ICD-10-CM

## 2011-10-30 DIAGNOSIS — F329 Major depressive disorder, single episode, unspecified: Secondary | ICD-10-CM | POA: Diagnosis not present

## 2011-10-30 DIAGNOSIS — F3289 Other specified depressive episodes: Secondary | ICD-10-CM | POA: Insufficient documentation

## 2011-10-30 HISTORY — DX: Other complications of anesthesia, initial encounter: T88.59XA

## 2011-10-30 HISTORY — DX: Adverse effect of unspecified anesthetic, initial encounter: T41.45XA

## 2011-10-30 HISTORY — DX: Other chronic pain: G89.29

## 2011-10-30 HISTORY — DX: Dorsalgia, unspecified: M54.9

## 2011-10-30 LAB — POCT I-STAT, CHEM 8
Glucose, Bld: 89 mg/dL (ref 70–99)
HCT: 47 % — ABNORMAL HIGH (ref 36.0–46.0)
Hemoglobin: 16 g/dL — ABNORMAL HIGH (ref 12.0–15.0)
Potassium: 4.1 mEq/L (ref 3.5–5.1)
Sodium: 142 mEq/L (ref 135–145)

## 2011-10-30 SURGERY — BIOPSY, LYMPH NODE, INGUINAL, OPEN
Anesthesia: Monitor Anesthesia Care | Site: Groin | Laterality: Left | Wound class: Clean

## 2011-10-30 MED ORDER — METOCLOPRAMIDE HCL 5 MG/ML IJ SOLN: 10.0000 mg | Freq: Once | INTRAMUSCULAR | Status: DC | PRN

## 2011-10-30 MED ORDER — LIDOCAINE HCL (PF) 1 % IJ SOLN
INTRAMUSCULAR | Status: DC | PRN
  Administered 2011-10-30: 6 mL

## 2011-10-30 MED ORDER — ACETAMINOPHEN 325 MG PO TABS: 650.0000 mg | ORAL_TABLET | ORAL | Status: DC | PRN

## 2011-10-30 MED ORDER — DEXAMETHASONE SODIUM PHOSPHATE 10 MG/ML IJ SOLN
INTRAMUSCULAR | Status: DC | PRN
  Administered 2011-10-30: 10 mg via INTRAVENOUS

## 2011-10-30 MED ORDER — OXYCODONE HCL 5 MG PO TABS: 5.0000 mg | ORAL_TABLET | Freq: Once | ORAL | Status: DC | PRN

## 2011-10-30 MED ORDER — HYDROCODONE-ACETAMINOPHEN 5-325 MG PO TABS: 1.0000 | ORAL_TABLET | Freq: Four times a day (QID) | ORAL | Status: AC | PRN

## 2011-10-30 MED ORDER — FENTANYL CITRATE 0.05 MG/ML IJ SOLN: 25.0000 ug | INTRAMUSCULAR | Status: DC | PRN

## 2011-10-30 MED ORDER — MIDAZOLAM HCL 5 MG/5ML IJ SOLN
INTRAMUSCULAR | Status: DC | PRN
  Administered 2011-10-30: 2 mg via INTRAVENOUS

## 2011-10-30 MED ORDER — OXYCODONE HCL 5 MG PO TABS: 5.0000 mg | ORAL_TABLET | ORAL | Status: DC | PRN

## 2011-10-30 MED ORDER — FENTANYL CITRATE 0.05 MG/ML IJ SOLN
INTRAMUSCULAR | Status: DC | PRN
  Administered 2011-10-30 (×2): 50 ug via INTRAVENOUS

## 2011-10-30 MED ORDER — MORPHINE SULFATE 2 MG/ML IJ SOLN: 1.0000 mg | INTRAMUSCULAR | Status: DC | PRN

## 2011-10-30 MED ORDER — ACETAMINOPHEN 650 MG RE SUPP: 650.0000 mg | RECTAL | Status: DC | PRN

## 2011-10-30 MED ORDER — LACTATED RINGERS IV SOLN
INTRAVENOUS | Status: DC
  Administered 2011-10-30: 08:00:00 via INTRAVENOUS

## 2011-10-30 MED ORDER — SODIUM CHLORIDE 0.9 % IV SOLN: 250.0000 mL | INTRAVENOUS | Status: DC | PRN

## 2011-10-30 MED ORDER — CEFAZOLIN SODIUM 1-5 GM-% IV SOLN
1.0000 g | INTRAVENOUS | Status: AC
Start: 2011-10-30 — End: 2011-10-30
  Administered 2011-10-30: 1 g via INTRAVENOUS

## 2011-10-30 MED ORDER — ONDANSETRON HCL 4 MG/2ML IJ SOLN: 4.0000 mg | Freq: Four times a day (QID) | INTRAMUSCULAR | Status: DC | PRN

## 2011-10-30 MED ORDER — LIDOCAINE HCL (CARDIAC) 20 MG/ML IV SOLN
INTRAVENOUS | Status: DC | PRN
  Administered 2011-10-30: 50 mg via INTRAVENOUS

## 2011-10-30 MED ORDER — PROPOFOL 10 MG/ML IV EMUL
INTRAVENOUS | Status: DC | PRN
  Administered 2011-10-30: 50 ug/kg/min via INTRAVENOUS

## 2011-10-30 MED ORDER — SODIUM CHLORIDE 0.9 % IJ SOLN: 3.0000 mL | Freq: Two times a day (BID) | INTRAMUSCULAR | Status: DC

## 2011-10-30 MED ORDER — ONDANSETRON HCL 4 MG/2ML IJ SOLN
INTRAMUSCULAR | Status: DC | PRN
  Administered 2011-10-30: 4 mg via INTRAVENOUS

## 2011-10-30 MED ORDER — SODIUM CHLORIDE 0.9 % IJ SOLN: 3.0000 mL | INTRAMUSCULAR | Status: DC | PRN

## 2011-10-30 SURGICAL SUPPLY — 50 items
APPLIER CLIP 9.375 MED OPEN (MISCELLANEOUS) ×2
BENZOIN TINCTURE PRP APPL 2/3 (GAUZE/BANDAGES/DRESSINGS) ×2 IMPLANT
BLADE HEX COATED 2.75 (ELECTRODE) ×2 IMPLANT
BLADE SURG 15 STRL LF DISP TIS (BLADE) ×1 IMPLANT
BLADE SURG 15 STRL SS (BLADE) ×1
CANISTER SUCTION 1200CC (MISCELLANEOUS) ×2 IMPLANT
CHLORAPREP W/TINT 26ML (MISCELLANEOUS) ×2 IMPLANT
CLIP APPLIE 9.375 MED OPEN (MISCELLANEOUS) ×1 IMPLANT
CLOTH BEACON ORANGE TIMEOUT ST (SAFETY) ×2 IMPLANT
COVER MAYO STAND STRL (DRAPES) ×2 IMPLANT
COVER TABLE BACK 60X90 (DRAPES) ×2 IMPLANT
DECANTER SPIKE VIAL GLASS SM (MISCELLANEOUS) IMPLANT
DRAIN CHANNEL 19F RND (DRAIN) IMPLANT
DRAIN PENROSE 1/4X12 LTX STRL (WOUND CARE) IMPLANT
DRAPE LAPAROSCOPIC ABDOMINAL (DRAPES) IMPLANT
DRAPE PED LAPAROTOMY (DRAPES) ×2 IMPLANT
DRAPE UTILITY XL STRL (DRAPES) ×2 IMPLANT
DRSG TEGADERM 2-3/8X2-3/4 SM (GAUZE/BANDAGES/DRESSINGS) ×2 IMPLANT
ELECT REM PT RETURN 9FT ADLT (ELECTROSURGICAL) ×2
ELECTRODE REM PT RTRN 9FT ADLT (ELECTROSURGICAL) ×1 IMPLANT
EVACUATOR SILICONE 100CC (DRAIN) IMPLANT
GLOVE BIO SURGEON STRL SZ7 (GLOVE) ×2 IMPLANT
GLOVE BIOGEL PI IND STRL 7.0 (GLOVE) ×1 IMPLANT
GLOVE BIOGEL PI INDICATOR 7.0 (GLOVE) ×1
GLOVE SKINSENSE NS SZ7.0 (GLOVE) ×1
GLOVE SKINSENSE STRL SZ7.0 (GLOVE) ×1 IMPLANT
GLOVE SURG SIGNA 7.5 PF LTX (GLOVE) ×2 IMPLANT
GOWN PREVENTION PLUS XLARGE (GOWN DISPOSABLE) ×2 IMPLANT
GOWN PREVENTION PLUS XXLARGE (GOWN DISPOSABLE) ×4 IMPLANT
NEEDLE HYPO 25X1 1.5 SAFETY (NEEDLE) ×2 IMPLANT
NS IRRIG 1000ML POUR BTL (IV SOLUTION) IMPLANT
PACK BASIN DAY SURGERY FS (CUSTOM PROCEDURE TRAY) ×2 IMPLANT
PENCIL BUTTON HOLSTER BLD 10FT (ELECTRODE) ×2 IMPLANT
PIN SAFETY STERILE (MISCELLANEOUS) IMPLANT
SLEEVE SCD COMPRESS KNEE MED (MISCELLANEOUS) IMPLANT
SPONGE GAUZE 2X2 8PLY STRL LF (GAUZE/BANDAGES/DRESSINGS) ×2 IMPLANT
SPONGE LAP 4X18 X RAY DECT (DISPOSABLE) ×2 IMPLANT
STAPLER VISISTAT 35W (STAPLE) IMPLANT
STRIP CLOSURE SKIN 1/2X4 (GAUZE/BANDAGES/DRESSINGS) ×2 IMPLANT
SUT ETHILON 2 0 FS 18 (SUTURE) IMPLANT
SUT MNCRL AB 4-0 PS2 18 (SUTURE) ×2 IMPLANT
SUT VIC AB 3-0 SH 27 (SUTURE) ×1
SUT VIC AB 3-0 SH 27X BRD (SUTURE) ×1 IMPLANT
SYR BULB 3OZ (MISCELLANEOUS) IMPLANT
SYR CONTROL 10ML LL (SYRINGE) ×2 IMPLANT
TOWEL OR 17X24 6PK STRL BLUE (TOWEL DISPOSABLE) ×2 IMPLANT
TOWEL OR NON WOVEN STRL DISP B (DISPOSABLE) ×2 IMPLANT
TUBE CONNECTING 20X1/4 (TUBING) ×2 IMPLANT
WATER STERILE IRR 1000ML POUR (IV SOLUTION) IMPLANT
YANKAUER SUCT BULB TIP NO VENT (SUCTIONS) ×2 IMPLANT

## 2011-10-30 NOTE — Transfer of Care (Signed)
Immediate Anesthesia Transfer of Care Note  Patient: [name]  Procedure(s) Performed: Procedure(s) (LRB): INGUINAL LYMPH NODE BIOPSY (Left)  Patient Location: PACU  Anesthesia Type: MAC  Level of Consciousness: awake, alert  and oriented  Airway & Oxygen Therapy: Patient Spontanous Breathing  Post-op Assessment: Report given to PACU RN and Post -op Vital signs reviewed and stable  Post vital signs: Reviewed and stable  Complications: No apparent anesthesia complications

## 2011-10-30 NOTE — Op Note (Signed)
INGUINAL LYMPH NODE BIOPSY  Procedure Note  [name] 10/30/2011   Pre-op Diagnosis: enlarged left inguinal lymph node     Post-op Diagnosis: same  Procedure(s): EXCISIONAL BIOPSY LEFT INGUINAL LYMPH NODE  Surgeon(s): Shelly Rubenstein, MD  Anesthesia: Monitor Anesthesia Care  Staff:  Aleen Sells, RN - Circulator Macie Burows Ricks, RN - Scrub Person  Estimated Blood Loss: Minimal               Procedure: The patient was brought to the operating room and identified as the correct patient. She was placed supine on the operating room table and anesthesia was induced. Her left groin was then prepped and draped in the usual sterile fashion. I anesthetized the skin of the palpable lymph node with 1% lidocaine. i made an incision with the scalpel. I took  this down to the tissue just below the inguinal ligament and identified several mildly enlarged lymph nodes. I excised [name] large node in its entirety with the cautery and sent to pathology fresh for evaluation. Hemostasis was achieved with cautery. I then closed subcutaneous tissue with interrupted 3-0 Vicryl sutures and closed the skin with [name] running 4-0 Monocryl. Steri-Strips, gauze, and Tegaderm applied. The patient tolerated the procedure well. All the counts were correct at the end of the procedure. The patient was then taken in [name] stable condition to the recovery room.          [name]   Date: 10/30/2011  Time: 9:16 AM

## 2011-10-30 NOTE — Progress Notes (Signed)
Pt. States she has noticed a second enlarged node on the left, not as big as the original and also one on the right.  States she informed someone in the office when she called for other info.

## 2011-10-30 NOTE — Anesthesia Postprocedure Evaluation (Signed)
Anesthesia Post Note  Patient: [name]  Procedure(s) Performed: Procedure(s) (LRB): INGUINAL LYMPH NODE BIOPSY (Left)  Anesthesia type: MAC  Patient location: PACU  Post pain: Pain level controlled  Post assessment: Patient's Cardiovascular Status Stable  Last Vitals:  Filed Vitals:   10/30/11 1000  BP: 95/58  Pulse: 53  Temp: 36.6 C  Resp: 16    Post vital signs: Reviewed and stable  Level of consciousness: alert  Complications: No apparent anesthesia complications

## 2011-10-30 NOTE — Anesthesia Preprocedure Evaluation (Signed)
Anesthesia Evaluation  Patient identified by MRN, date of birth, ID band Patient awake    Reviewed: Allergy & Precautions, H&P , NPO status , Patient's Chart, lab work & pertinent test results, reviewed documented beta blocker date and time   History of Anesthesia Complications (+) DIFFICULT IV STICK / SPECIAL LINE  Airway Mallampati: II TM Distance: >3 FB Neck ROM: full    Dental   Pulmonary COPD         Cardiovascular + dysrhythmias Supra Ventricular Tachycardia + Valvular Problems/Murmurs MVP     Neuro/Psych PSYCHIATRIC DISORDERS Anxiety Depression negative neurological ROS     GI/Hepatic negative GI ROS, Neg liver ROS,   Endo/Other  negative endocrine ROS  Renal/GU negative Renal ROS  negative genitourinary   Musculoskeletal   Abdominal   Peds  Hematology negative hematology ROS (+)   Anesthesia Other Findings See surgeon's H&P   Reproductive/Obstetrics negative OB ROS                           Anesthesia Physical Anesthesia Plan  ASA: III  Anesthesia Plan: MAC   Post-op Pain Management:    Induction: Intravenous  Airway Management Planned: Simple Face Mask  Additional Equipment:   Intra-op Plan:   Post-operative Plan:   Informed Consent: I have reviewed the patients History and Physical, chart, labs and discussed the procedure including the risks, benefits and alternatives for the proposed anesthesia with the patient or authorized representative who has indicated his/her understanding and acceptance.   Dental Advisory Given  Plan Discussed with: CRNA and Surgeon  Anesthesia Plan Comments:         Anesthesia Quick Evaluation

## 2011-10-30 NOTE — Anesthesia Procedure Notes (Signed)
Procedure Name: MAC Performed by: Cheral Cappucci W Pre-anesthesia Checklist: Patient identified, Timeout performed, Emergency Drugs available, Suction available and Patient being monitored Oxygen Delivery Method: Simple face mask       

## 2011-10-30 NOTE — Interval H&P Note (Signed)
History and Physical Interval Note: no change  10/30/2011 8:31 AM  [name]  has presented today for surgery, with the diagnosis of enlarged left inguinal lymph node  The various methods of treatment have been discussed with the patient and family. After consideration of risks, benefits and other options for treatment, the patient has consented to  Procedure(s) (LRB): INGUINAL LYMPH NODE BIOPSY (Left) as a surgical intervention .  The patient's history has been reviewed, patient examined, no change in status, stable for surgery.  I have reviewed the patients' chart and labs.  Questions were answered to the patient's satisfaction.     Deborah Lazcano A

## 2011-11-11 DIAGNOSIS — N3289 Other specified disorders of bladder: Secondary | ICD-10-CM | POA: Diagnosis not present

## 2011-11-11 DIAGNOSIS — N952 Postmenopausal atrophic vaginitis: Secondary | ICD-10-CM | POA: Diagnosis not present

## 2011-11-11 DIAGNOSIS — N3090 Cystitis, unspecified without hematuria: Secondary | ICD-10-CM | POA: Diagnosis not present

## 2011-11-20 ENCOUNTER — Encounter (INDEPENDENT_AMBULATORY_CARE_PROVIDER_SITE_OTHER): Payer: Self-pay | Admitting: Surgery

## 2011-11-20 ENCOUNTER — Ambulatory Visit (INDEPENDENT_AMBULATORY_CARE_PROVIDER_SITE_OTHER): Payer: Medicare Other | Admitting: Surgery

## 2011-11-20 VITALS — BP 110/78 | HR 60 | Temp 97.7°F | Resp 12 | Ht 68.0 in | Wt 114.0 lb

## 2011-11-20 DIAGNOSIS — Z09 Encounter for follow-up examination after completed treatment for conditions other than malignant neoplasm: Secondary | ICD-10-CM

## 2011-11-20 NOTE — Progress Notes (Signed)
Subjective:     Patient ID: [name], female   DOB: 1950-10-13, 61 y.o.   MRN: [id number]  HPI She is here for a postop visit status post excisional biopsy of a left inguinal lymph node. She has no complaints.  Review of Systems     Objective:   Physical Exam    on exam, there is a small seroma the incision which I aspirated with a syringe. There was no evidence of infection.  The final pathology showed a benign hyperplastic lymph node with no evidence of malignancy or lymphoma Assessment:     Patient status post excisional biopsy of left inguinal lymph node    Plan:     She may resume normal activities. I will see her back as needed.

## 2011-11-21 DIAGNOSIS — S81809A Unspecified open wound, unspecified lower leg, initial encounter: Secondary | ICD-10-CM | POA: Diagnosis not present

## 2011-12-16 DIAGNOSIS — I890 Lymphedema, not elsewhere classified: Secondary | ICD-10-CM | POA: Diagnosis not present

## 2011-12-16 DIAGNOSIS — L0390 Cellulitis, unspecified: Secondary | ICD-10-CM | POA: Diagnosis not present

## 2011-12-16 DIAGNOSIS — L97209 Non-pressure chronic ulcer of unspecified calf with unspecified severity: Secondary | ICD-10-CM | POA: Diagnosis not present

## 2011-12-16 DIAGNOSIS — L0291 Cutaneous abscess, unspecified: Secondary | ICD-10-CM | POA: Diagnosis not present

## 2011-12-16 DIAGNOSIS — L02419 Cutaneous abscess of limb, unspecified: Secondary | ICD-10-CM | POA: Diagnosis not present

## 2011-12-22 DIAGNOSIS — L97209 Non-pressure chronic ulcer of unspecified calf with unspecified severity: Secondary | ICD-10-CM | POA: Diagnosis not present

## 2011-12-22 DIAGNOSIS — T148XXA Other injury of unspecified body region, initial encounter: Secondary | ICD-10-CM | POA: Diagnosis not present

## 2011-12-22 DIAGNOSIS — I890 Lymphedema, not elsewhere classified: Secondary | ICD-10-CM | POA: Diagnosis not present

## 2011-12-30 DIAGNOSIS — L97209 Non-pressure chronic ulcer of unspecified calf with unspecified severity: Secondary | ICD-10-CM | POA: Diagnosis not present

## 2011-12-30 DIAGNOSIS — I890 Lymphedema, not elsewhere classified: Secondary | ICD-10-CM | POA: Diagnosis not present

## 2012-01-04 ENCOUNTER — Inpatient Hospital Stay (HOSPITAL_COMMUNITY)
Admission: EM | Admit: 2012-01-04 | Discharge: 2012-01-06 | DRG: 918 | Disposition: A | Discharge status: Home / Self Care | Payer: Medicare Other | Attending: Family Medicine | Admitting: Family Medicine

## 2012-01-04 ENCOUNTER — Emergency Department (HOSPITAL_COMMUNITY): Payer: Medicare Other

## 2012-01-04 ENCOUNTER — Encounter (HOSPITAL_COMMUNITY): Payer: Self-pay | Admitting: Physical Medicine and Rehabilitation

## 2012-01-04 DIAGNOSIS — S61409A Unspecified open wound of unspecified hand, initial encounter: Secondary | ICD-10-CM | POA: Diagnosis not present

## 2012-01-04 DIAGNOSIS — I498 Other specified cardiac arrhythmias: Secondary | ICD-10-CM | POA: Diagnosis present

## 2012-01-04 DIAGNOSIS — J449 Chronic obstructive pulmonary disease, unspecified: Secondary | ICD-10-CM | POA: Diagnosis present

## 2012-01-04 DIAGNOSIS — Z87891 Personal history of nicotine dependence: Secondary | ICD-10-CM

## 2012-01-04 DIAGNOSIS — R9431 Abnormal electrocardiogram [ECG] [EKG]: Secondary | ICD-10-CM | POA: Diagnosis not present

## 2012-01-04 DIAGNOSIS — L97209 Non-pressure chronic ulcer of unspecified calf with unspecified severity: Secondary | ICD-10-CM | POA: Diagnosis not present

## 2012-01-04 DIAGNOSIS — T6391XA Toxic effect of contact with unspecified venomous animal, accidental (unintentional), initial encounter: Principal | ICD-10-CM | POA: Diagnosis present

## 2012-01-04 DIAGNOSIS — E785 Hyperlipidemia, unspecified: Secondary | ICD-10-CM | POA: Diagnosis present

## 2012-01-04 DIAGNOSIS — T63001A Toxic effect of unspecified snake venom, accidental (unintentional), initial encounter: Secondary | ICD-10-CM | POA: Diagnosis present

## 2012-01-04 DIAGNOSIS — J984 Other disorders of lung: Secondary | ICD-10-CM | POA: Diagnosis not present

## 2012-01-04 DIAGNOSIS — G8929 Other chronic pain: Secondary | ICD-10-CM | POA: Diagnosis present

## 2012-01-04 DIAGNOSIS — J4489 Other specified chronic obstructive pulmonary disease: Secondary | ICD-10-CM | POA: Diagnosis present

## 2012-01-04 DIAGNOSIS — T63004A Toxic effect of unspecified snake venom, undetermined, initial encounter: Secondary | ICD-10-CM

## 2012-01-04 DIAGNOSIS — I890 Lymphedema, not elsewhere classified: Secondary | ICD-10-CM | POA: Diagnosis not present

## 2012-01-04 DIAGNOSIS — W5911XA Bitten by nonvenomous snake, initial encounter: Secondary | ICD-10-CM | POA: Diagnosis present

## 2012-01-04 LAB — FIBRINOGEN: Fibrinogen: 325 mg/dL (ref 204–475)

## 2012-01-04 LAB — CBC WITH DIFFERENTIAL/PLATELET
Basophils Absolute: 0 10*3/uL (ref 0.0–0.1)
Basophils Relative: 0 % (ref 0–1)
Hemoglobin: 15.6 g/dL — ABNORMAL HIGH (ref 12.0–15.0)
MCHC: 34 g/dL (ref 30.0–36.0)
Monocytes Relative: 9 % (ref 3–12)
Neutro Abs: 2.7 10*3/uL (ref 1.7–7.7)
Neutrophils Relative %: 54 % (ref 43–77)
RBC: 4.63 MIL/uL (ref 3.87–5.11)
WBC: 5 10*3/uL (ref 4.0–10.5)

## 2012-01-04 LAB — CBC
Hemoglobin: 14.6 g/dL (ref 12.0–15.0)
MCH: 33.2 pg (ref 26.0–34.0)
MCV: 100.2 fL — ABNORMAL HIGH (ref 78.0–100.0)
Platelets: 166 10*3/uL (ref 150–400)
RBC: 4.4 MIL/uL (ref 3.87–5.11)
WBC: 8.7 10*3/uL (ref 4.0–10.5)

## 2012-01-04 LAB — POCT I-STAT, CHEM 8
Creatinine, Ser: 1.1 mg/dL (ref 0.50–1.10)
Hemoglobin: 16 g/dL — ABNORMAL HIGH (ref 12.0–15.0)
Potassium: 4.3 mEq/L (ref 3.5–5.1)
Sodium: 141 mEq/L (ref 135–145)
TCO2: 25 mmol/L (ref 0–100)

## 2012-01-04 LAB — URINALYSIS, ROUTINE W REFLEX MICROSCOPIC
Bilirubin Urine: NEGATIVE
Ketones, ur: NEGATIVE mg/dL
Nitrite: NEGATIVE
Specific Gravity, Urine: 1.02 (ref 1.005–1.030)
Urobilinogen, UA: 0.2 mg/dL (ref 0.0–1.0)

## 2012-01-04 LAB — MRSA PCR SCREENING: MRSA by PCR: NEGATIVE

## 2012-01-04 LAB — APTT: aPTT: 34 seconds (ref 24–37)

## 2012-01-04 LAB — PROTIME-INR: INR: 1.01 (ref 0.00–1.49)

## 2012-01-04 MED ORDER — HYDROMORPHONE HCL PF 2 MG/ML IJ SOLN: 2.0000 mg | Freq: Once | INTRAMUSCULAR | Status: DC

## 2012-01-04 MED ORDER — ONDANSETRON 4 MG PO TBDP
8.0000 mg | ORAL_TABLET | Freq: Once | ORAL | Status: AC
  Administered 2012-01-04: 8 mg via ORAL
  Filled 2012-01-04: qty 2

## 2012-01-04 MED ORDER — VERAPAMIL HCL ER 240 MG PO TBCR
240.0000 mg | EXTENDED_RELEASE_TABLET | Freq: Every day | ORAL | Status: DC
  Administered 2012-01-04 – 2012-01-05 (×2): 240 mg via ORAL
  Filled 2012-01-04 (×4): qty 1

## 2012-01-04 MED ORDER — SODIUM CHLORIDE 0.9 % IV SOLN
INTRAVENOUS | Status: DC
  Administered 2012-01-04: 160 mL/h via INTRAVENOUS

## 2012-01-04 MED ORDER — MORPHINE SULFATE 2 MG/ML IJ SOLN
2.0000 mg | INTRAMUSCULAR | Status: DC | PRN
  Administered 2012-01-04: 2 mg via INTRAVENOUS
  Filled 2012-01-04: qty 1

## 2012-01-04 MED ORDER — HYDROMORPHONE HCL PF 1 MG/ML IJ SOLN: 1.0000 mg | INTRAMUSCULAR | Status: DC | PRN

## 2012-01-04 MED ORDER — VERAPAMIL HCL 240 MG (CO) PO TB24
240.0000 mg | ORAL_TABLET | Freq: Every day | ORAL | Status: DC
Start: 2012-01-04 — End: 2012-01-04

## 2012-01-04 MED ORDER — ATORVASTATIN CALCIUM 20 MG PO TABS
20.0000 mg | ORAL_TABLET | Freq: Every day | ORAL | Status: DC
  Administered 2012-01-04 – 2012-01-05 (×2): 20 mg via ORAL
  Filled 2012-01-04 (×3): qty 1

## 2012-01-04 MED ORDER — SODIUM CHLORIDE 0.9 % IV SOLN
4.0000 | Freq: Once | INTRAVENOUS | Status: AC
  Administered 2012-01-04: 40 mL via INTRAVENOUS
  Filled 2012-01-04: qty 40

## 2012-01-04 MED ORDER — ONDANSETRON HCL 4 MG/2ML IJ SOLN: 4.0000 mg | Freq: Three times a day (TID) | INTRAMUSCULAR | Status: DC | PRN

## 2012-01-04 MED ORDER — ONDANSETRON HCL 4 MG/2ML IJ SOLN: 4.0000 mg | Freq: Once | INTRAMUSCULAR | Status: DC

## 2012-01-04 MED ORDER — HYDROMORPHONE 0.3 MG/ML IV SOLN
INTRAVENOUS | Status: DC
  Administered 2012-01-04: 23:00:00 via INTRAVENOUS
  Administered 2012-01-05: 0.2 mg via INTRAVENOUS
  Administered 2012-01-05: 1.29 mg via INTRAVENOUS
  Administered 2012-01-05: 4 mg via INTRAVENOUS
  Administered 2012-01-05: 0.4 mg via INTRAVENOUS
  Administered 2012-01-05: 0.999 mg via INTRAVENOUS
  Administered 2012-01-06: 0.2 mg via INTRAVENOUS
  Filled 2012-01-04: qty 25

## 2012-01-04 MED ORDER — HYDROMORPHONE HCL PF 2 MG/ML IJ SOLN
2.0000 mg | Freq: Once | INTRAMUSCULAR | Status: AC
  Administered 2012-01-04: 2 mg via INTRAMUSCULAR
  Filled 2012-01-04: qty 1

## 2012-01-04 MED ORDER — SODIUM CHLORIDE 0.9 % IV SOLN
INTRAVENOUS | Status: AC
  Administered 2012-01-04: 22:00:00 via INTRAVENOUS

## 2012-01-04 MED ORDER — HYDROMORPHONE HCL PF 1 MG/ML IJ SOLN: 1.0000 mg | Freq: Once | INTRAMUSCULAR | Status: DC

## 2012-01-04 MED ORDER — ONDANSETRON HCL 4 MG/2ML IJ SOLN: 8.0000 mg | Freq: Once | INTRAMUSCULAR | Status: DC

## 2012-01-04 MED ORDER — ONDANSETRON HCL 4 MG/2ML IJ SOLN: 4.0000 mg | Freq: Four times a day (QID) | INTRAMUSCULAR | Status: DC | PRN

## 2012-01-04 MED ORDER — NALOXONE HCL 0.4 MG/ML IJ SOLN: 0.4000 mg | INTRAMUSCULAR | Status: DC | PRN

## 2012-01-04 MED ORDER — SODIUM CHLORIDE 0.9 % IV SOLN
INTRAVENOUS | Status: DC
  Administered 2012-01-05: 125 mL/h via INTRAVENOUS
  Administered 2012-01-05: 08:00:00 via INTRAVENOUS
  Administered 2012-01-06: 125 mL/h via INTRAVENOUS

## 2012-01-04 MED ORDER — DIPHENHYDRAMINE HCL 50 MG/ML IJ SOLN
12.5000 mg | Freq: Four times a day (QID) | INTRAMUSCULAR | Status: DC | PRN
  Administered 2012-01-06: 12.5 mg via INTRAVENOUS
  Filled 2012-01-04: qty 1

## 2012-01-04 MED ORDER — DIPHENHYDRAMINE HCL 12.5 MG/5ML PO ELIX
12.5000 mg | ORAL_SOLUTION | Freq: Four times a day (QID) | ORAL | Status: DC | PRN
  Filled 2012-01-04: qty 5

## 2012-01-04 MED ORDER — SERTRALINE HCL 100 MG PO TABS
100.0000 mg | ORAL_TABLET | Freq: Every day | ORAL | Status: DC
  Administered 2012-01-05 – 2012-01-06 (×2): 100 mg via ORAL
  Filled 2012-01-04 (×3): qty 1

## 2012-01-04 MED ORDER — SODIUM CHLORIDE 0.9 % IJ SOLN: 9.0000 mL | INTRAMUSCULAR | Status: DC | PRN

## 2012-01-04 MED ORDER — ATENOLOL 25 MG PO TABS
25.0000 mg | ORAL_TABLET | Freq: Two times a day (BID) | ORAL | Status: DC
  Administered 2012-01-04 – 2012-01-06 (×4): 25 mg via ORAL
  Filled 2012-01-04 (×6): qty 1

## 2012-01-04 MED ORDER — SODIUM CHLORIDE 0.9 % IV SOLN
1.0000 mg/h | INTRAVENOUS | Status: DC
  Filled 2012-01-04: qty 5

## 2012-01-04 NOTE — ED Provider Notes (Signed)
History     CSN: [id number]  Arrival date & time 01/04/12  1402   First MD Initiated Contact with Patient 01/04/12 1424      Chief Complaint  Patient presents with  . Snake Bite    (Consider location/radiation/quality/duration/timing/severity/associated sxs/prior treatment) HPI  Past Medical History  Diagnosis Date  . Anxiety   . Arthritis   . Depression   . PSVT (paroxysmal supraventricular tachycardia)   . Heart murmur   . Back pain, chronic     Past Surgical History  Procedure Date  . Tubal ligation   . Cervical disc surgery   . Colectomy   . Abdominal hysterectomy   . Knee surgery   . Tonsillectomy   . Spine surgery 1983-01/2007  . Back surgery     14 back surgery  . Breast surgery 1976    breast reduction    Family History  Problem Relation Age of Onset  . COPD Father     History  Substance Use Topics  . Smoking status: Former Games developer  . Smokeless tobacco: Former Neurosurgeon    Quit date: 09/30/2008  . Alcohol Use: 0.0 oz/week    3-4 Glasses of wine per week     weekly    OB History    Grav Para Term Preterm Abortions TAB SAB Ect Mult Living                  Review of Systems  Allergies  Bupropion hcl and Morphine  Home Medications   Current Outpatient Rx  Name Route Sig Dispense Refill  . ASPIRIN 325 MG PO TABS Oral Take 325 mg by mouth daily.    . ATENOLOL 50 MG PO TABS Oral Take 25 mg by mouth 2 (two) times daily.    . ATORVASTATIN CALCIUM 20 MG PO TABS Oral Take 20 mg by mouth daily.    Marland Kitchen BACLOFEN 20 MG PO TABS Oral Take 20 mg by mouth 3 (three) times daily.     Marland Kitchen CALCIUM + D PO Oral Take by mouth.    . CHLORHEXIDINE GLUCONATE 0.12 % MT SOLN Mouth/Throat Use as directed 15 mLs in the mouth or throat 2 (two) times daily.     Marland Kitchen CLONAZEPAM 1 MG PO TABS Oral Take 1 mg by mouth at bedtime.    . OMEGA-3 FATTY ACIDS 1000 MG PO CAPS Oral Take 2 g by mouth daily.    Marland Kitchen HYDROCODONE-ACETAMINOPHEN 10-325 MG PO TABS Oral Take 1 tablet by mouth every 4  (four) hours as needed. For pain    . PROBIOTIC DAILY PO Oral Take 1 tablet by mouth daily.    . SERTRALINE HCL 100 MG PO TABS Oral Take 100 mg by mouth daily.    Marland Kitchen VERAPAMIL HCL ER (CO) 240 MG PO TB24 Oral Take 240 mg by mouth at bedtime.      BP 142/83  Pulse 69  Temp 98.1 F (36.7 C) (Oral)  Resp 20  SpO2 97%  Physical Exam  ED Course  Procedures (including critical care time)  Labs Reviewed  CBC WITH DIFFERENTIAL - Abnormal; Notable for the following:    Hemoglobin 15.6 (*)     All other components within normal limits  POCT I-STAT, CHEM 8 - Abnormal; Notable for the following:    Hemoglobin 16.0 (*)     HCT 47.0 (*)     All other components within normal limits  PROTIME-INR  APTT  URINALYSIS, ROUTINE W REFLEX MICROSCOPIC   Dg Chest  Port 1 View  01/04/2012  *RADIOLOGY REPORT*  Clinical Data: Snake bite on the right hand.  PORTABLE CHEST - 1 VIEW 01/04/2012 1540 hours:  Comparison: Two-view chest x-ray 01/29/2011 [facility], 01/21/2011 [facility].  Findings: Cardiac silhouette upper normal in size for the AP portable technique, unchanged.  Scarring at the left lung base at the site of the prior pneumonia.  Stable hyperinflation.  Lungs otherwise clear.  No pleural effusions.  IMPRESSION: Hyperinflation consistent with COPD and/or asthma.  Scarring at the left lung base.  No acute cardiopulmonary disease.   Original Report Authenticated By: Arnell Sieving, M.D.      1. Poisonous snake bite     3:30 PM Handoff from Dr. Ignacia Palma at 1500 and patient seen. Swelling to distal R forearm. Otherwise no N/V, tachycardia. As of 1530, patient in worsening pain with nausea. Dr. Manus Gunning has seen. Nursing unable to get IV. Dr. Manus Gunning to attempt EJ. Pain and nausea medication ordered.   Vital signs reviewed and are as follows: Filed Vitals:   01/04/12 1404  BP: 142/83  Pulse: 69  Temp: 98.1 F (36.7 C)  Resp: 20   4:14 PM Crofab arrived in ED. Additional dose of pain  medication ordered. Swelling has progressed to mid-forearm. Compartments soft. 2+ radial pulse.   4:44 PM [facility] to admit. Patient seen. No reaction to crofab at this point. Continues to have pain. Another dose of dilaudid ordered.   MDM  Admit for copperhead envenomation.        Renne Crigler, [place] 01/04/12 [phone]

## 2012-01-04 NOTE — ED Notes (Signed)
Dr. Manus Gunning to attempt EJ lV line.

## 2012-01-04 NOTE — ED Notes (Signed)
Pt presents to department for evaluation of snake bite. States she was working in garden when he was struck by a Environmental education officer. Puncture wounds noted to R middle finger. Significant swelling/bruising noted.  Pt also states numbness to middle finger and palm area. Denies SOB. Respirations unlabored. She is conscious alert and oriented x4.

## 2012-01-04 NOTE — ED Provider Notes (Signed)
Angiocath insertion Performed by: Glynn Octave  Consent: Verbal consent obtained. Risks and benefits: risks, benefits and alternatives were discussed Time out: Immediately prior to procedure a "time out" was called to verify the correct patient, procedure, equipment, support staff and site/side marked as required.  Preparation: Patient was prepped and draped in the usual sterile fashion.  Vein Location: L EJ  Not Ultrasound Guided  Gauge: 20  Normal blood return and flush without difficulty Patient tolerance: Patient tolerated the procedure well with no immediate complications.     Glynn Octave, MD 01/04/12 [phone]

## 2012-01-04 NOTE — ED Provider Notes (Signed)
Medical screening examination/treatment/procedure(s) were conducted as a shared visit with non-physician practitioner(s) and myself.  I personally evaluated the patient during the encounter   Sara Octave, MD 01/04/12 1811

## 2012-01-04 NOTE — ED Provider Notes (Addendum)
History     CSN: [id number]  Arrival date & time 01/04/12  1402   First MD Initiated Contact with Patient 01/04/12 1424      Chief Complaint  Patient presents with  . Snake Bite    (Consider location/radiation/quality/duration/timing/severity/associated sxs/prior treatment) Patient is a 61 y.o. female presenting with animal bite. The history is provided by the patient. No language interpreter was used.  Animal Bite  The incident occurred just prior to arrival. The incident occurred at home (Pt was gardening and was bitten on the right middle finger by a small copperhead snake.). She came to the ER via personal transport. Head/neck injury location: Right middle finger. The pain is moderate. It is unlikely that a foreign body is present. Associated symptoms include abdominal pain. There have been no prior injuries to these areas. She has received no recent medical care.    Past Medical History  Diagnosis Date  . Anxiety   . Arthritis   . Depression   . PSVT (paroxysmal supraventricular tachycardia)   . Heart murmur   . Back pain, chronic     Past Surgical History  Procedure Date  . Tubal ligation   . Cervical disc surgery   . Colectomy   . Abdominal hysterectomy   . Knee surgery   . Tonsillectomy   . Spine surgery 1983-01/2007  . Back surgery     14 back surgery  . Breast surgery 1976    breast reduction    Family History  Problem Relation Age of Onset  . COPD Father     History  Substance Use Topics  . Smoking status: Former Games developer  . Smokeless tobacco: Former Neurosurgeon    Quit date: 09/30/2008  . Alcohol Use: 0.0 oz/week    3-4 Glasses of wine per week     weekly    OB History    Grav Para Term Preterm Abortions TAB SAB Ect Mult Living                  Review of Systems  Gastrointestinal: Positive for abdominal pain.    Allergies  Bupropion hcl and Morphine  Home Medications   Current Outpatient Rx  Name Route Sig Dispense Refill  . ASPIRIN 325  MG PO TABS Oral Take 325 mg by mouth daily.    . ATENOLOL 50 MG PO TABS Oral Take 25 mg by mouth 2 (two) times daily.    . ATORVASTATIN CALCIUM 20 MG PO TABS Oral Take 20 mg by mouth daily.    Marland Kitchen BACLOFEN 20 MG PO TABS Oral Take 20 mg by mouth 3 (three) times daily.     Marland Kitchen CLONAZEPAM 1 MG PO TABS Oral Take 1 mg by mouth at bedtime.    Marland Kitchen HYDROCODONE-ACETAMINOPHEN 10-325 MG PO TABS Oral Take 1 tablet by mouth every 4 (four) hours as needed. For pain    . PROBIOTIC DAILY PO Oral Take 1 tablet by mouth daily.    . PSYLLIUM 58.6 % PO POWD Oral Take 1 packet by mouth 3 (three) times daily.    . SERTRALINE HCL 100 MG PO TABS Oral Take 100 mg by mouth daily.    Marland Kitchen VERAPAMIL HCL ER (CO) 240 MG PO TB24 Oral Take 240 mg by mouth at bedtime.    Marland Kitchen CALCIUM + D PO Oral Take by mouth.    . CHLORHEXIDINE GLUCONATE 0.12 % MT SOLN      . OMEGA-3 FATTY ACIDS 1000 MG PO CAPS Oral Take 2  g by mouth daily.      BP 142/83  Pulse 69  Temp 98.1 F (36.7 C) (Oral)  Resp 20  SpO2 97%  Physical Exam  Nursing note and vitals reviewed. Constitutional: She is oriented to person, place, and time. She appears well-developed and well-nourished.       In moderate distress with pain in the right middle finger and hand.  HENT:  Head: Normocephalic and atraumatic.  Right Ear: External ear normal.  Left Ear: External ear normal.  Mouth/Throat: Oropharynx is clear and moist.  Eyes: Conjunctivae normal and EOM are normal. Pupils are equal, round, and reactive to light.  Neck: Normal range of motion. Neck supple.  Cardiovascular: Normal rate, regular rhythm and normal heart sounds.   Pulmonary/Chest: Effort normal and breath sounds normal.  Abdominal: Soft. Bowel sounds are normal.  Musculoskeletal:       Pt has 2 punctures on dorsum of right middle finger.  That finger has swelling and ecchymosis, that extends onto the palm of her hand.  There is no loss of sensation or tendon function in her right hand.  Neurological: She  is alert and oriented to person, place, and time.       No sensory or motor deficit.  Skin: Skin is warm and dry.  Psychiatric: She has a normal mood and affect. Her behavior is normal.    ED Course  Procedures (including critical care time)  2:39 PM Call to [facility] to [place], discussed Mrs. Carrara's snake bite.  She advised observation q15 minutes to see how the swelling progresses, start IV and give IV pain medicine, and check with Pharmacy re antivenom.    Date: 01/04/2012  Rate: 62  Rhythm: normal sinus rhythm  QRS Axis: normal  Intervals: normal QRS:  Poor R wave progression in precordial leads suggests possible old anterior myocardial infarction.  Left atrial abnormality.  ST/T Wave abnormalities: normal  Conduction Disutrbances:none  Narrative Interpretation: Abnormal EKG  Old EKG Reviewed: unchanged  4:28 PM Swelling has progressed up pt's arm.  Recontacted [facility], who agree that pt should receive Cro-fab.   1. Poisonous snake bite    Will need admission when lab workup returns.  Mr. Emmit Alexanders to follow, make disposition.        Carleene Cooper III, MD 01/04/12 1631  Carleene Cooper III, MD 02/15/12 1254

## 2012-01-04 NOTE — ED Notes (Signed)
Pt reports having pain to right hand/forearm, pt has moderate swelling which is outlined. Pt denies any increase in swelling, reports "less swelling" to right forearm, pt has palpable radial pulse. Pt can wiggle digits, still has swelling and bruising to middle digit, no drainage noted.

## 2012-01-04 NOTE — ED Notes (Signed)
Paged IV team a second time 

## 2012-01-04 NOTE — ED Notes (Signed)
Attempted to call report, RN is unavailable and to call back for pt report. Will continue to monitor pt.

## 2012-01-04 NOTE — ED Notes (Signed)
Pt presents with snake bite.  Swelling marked by Dr. Ignacia Palma.  IV team paged.

## 2012-01-04 NOTE — H&P (Signed)
Subjective:   Patient is a 61 y.o. female presents with a copperhead bite to the right hand approximately 4 hours ago. The patient was weeding her garden when she felt a sharp sting and noticed two bite marks and saw a small copperhead in the grass. She immediately came to the ED. She denies headache, abdominal pain, nausea. She has noticed swelling and severe pain of the right hand.   In the ED the patient received 4 mg of dilaudid IM, 4 vials of crofab antivenom, 8 mg of zofran.  PRN Meds:.   Patient Active Problem List   Diagnosis Date Noted  . Snake bite 01/04/2012  . Inguinal lymphadenopathy 10/01/2011  . HYPERLIPIDEMIA 11/07/2008  . DEPRESSION 11/07/2008  . MITRAL VALVE PROLAPSE 11/07/2008  . SUPRAVENTRICULAR TACHYCARDIA 11/07/2008  . COPD 11/07/2008   Past Medical History  Diagnosis Date  . Anxiety   . Arthritis   . Depression   . PSVT (paroxysmal supraventricular tachycardia)   . Heart murmur   . Back pain, chronic     Past Surgical History  Procedure Date  . Tubal ligation   . Cervical disc surgery   . Colectomy   . Abdominal hysterectomy   . Knee surgery   . Tonsillectomy   . Spine surgery 1983-01/2007  . Back surgery     14 back surgery  . Breast surgery 1976    breast reduction     (Not in a hospital admission) Allergies  Allergen Reactions  . Bupropion Hcl Swelling  . Morphine Itching    History  Substance Use Topics  . Smoking status: Former Games developer  . Smokeless tobacco: Former Neurosurgeon    Quit date: 09/30/2008  . Alcohol Use: 0.0 oz/week    3-4 Glasses of wine per week     weekly    Family History  Problem Relation Age of Onset  . COPD Father     Review of Systems Review of Systems  Constitutional: Negative for fever and chills.  Eyes: Negative for blurred vision.  Respiratory: Negative for shortness of breath.   Cardiovascular: Negative for chest pain and palpitations.  Gastrointestinal: Negative for nausea, vomiting, abdominal pain and  diarrhea.  Musculoskeletal: Positive for back pain.  Neurological: Negative for weakness and headaches.    Objective:   Patient Vitals for the past 8 hrs:  BP Temp Temp src Pulse Resp SpO2  01/04/12 1404 142/83 mmHg 98.1 F (36.7 C) Oral 69  20  97 %    Physical Exam  Constitutional: She is oriented to person, place, and time and well-developed, well-nourished, and in no distress.  HENT:  Head: Normocephalic.  Mouth/Throat: Oropharynx is clear and moist.  Eyes: Conjunctivae are normal.       Pinpoint pupils.  Neck: Normal range of motion. Neck supple. No JVD present. No thyromegaly present.  Cardiovascular: Normal rate and normal heart sounds.   Pulmonary/Chest: Effort normal and breath sounds normal.  Abdominal: Soft. Bowel sounds are normal.  Lymphadenopathy:    She has no cervical adenopathy.  Neurological: She is alert and oriented to person, place, and time.  Skin:       Swelling of right hand and forearm. Redness on palm and mild cyanosis of fingertips. Circumfrential measurements:    Right: 195 at mid-forearm, 170 at distal forearm, 200 at hand, 210 at hand above the thumb.  Left: 200 at mid-forearm, 180 at distal forearm, 160 at hand, 190 at hand above thumb      Data Review Results  for orders placed during the hospital encounter of 01/04/12 (from the past 24 hour(s))  CBC WITH DIFFERENTIAL     Status: Abnormal   Collection Time   01/04/12  3:10 PM      Component Value Range   WBC 5.0  4.0 - 10.5 K/uL   RBC 4.63  3.87 - 5.11 MIL/uL   Hemoglobin 15.6 (*) 12.0 - 15.0 g/dL   HCT 40.9  81.1 - 91.4 %   MCV 99.1  78.0 - 100.0 fL   MCH 33.7  26.0 - 34.0 pg   MCHC 34.0  30.0 - 36.0 g/dL   RDW 78.2  95.6 - 21.3 %   Platelets 174  150 - 400 K/uL   Neutrophils Relative 54  43 - 77 %   Neutro Abs 2.7  1.7 - 7.7 K/uL   Lymphocytes Relative 35  12 - 46 %   Lymphs Abs 1.8  0.7 - 4.0 K/uL   Monocytes Relative 9  3 - 12 %   Monocytes Absolute 0.5  0.1 - 1.0 K/uL    Eosinophils Relative 1  0 - 5 %   Eosinophils Absolute 0.1  0.0 - 0.7 K/uL   Basophils Relative 0  0 - 1 %   Basophils Absolute 0.0  0.0 - 0.1 K/uL  PROTIME-INR     Status: Normal   Collection Time   01/04/12  3:10 PM      Component Value Range   Prothrombin Time 13.5  11.6 - 15.2 seconds   INR 1.01  0.00 - 1.49  APTT     Status: Normal   Collection Time   01/04/12  3:10 PM      Component Value Range   aPTT 34  24 - 37 seconds  POCT I-STAT, CHEM 8     Status: Abnormal   Collection Time   01/04/12  3:37 PM      Component Value Range   Sodium 141  135 - 145 mEq/L   Potassium 4.3  3.5 - 5.1 mEq/L   Chloride 105  96 - 112 mEq/L   BUN 22  6 - 23 mg/dL   Creatinine, Ser 0.86  0.50 - 1.10 mg/dL   Glucose, Bld 95  70 - 99 mg/dL   Calcium, Ion 5.78  4.69 - 1.23 mmol/L   TCO2 25  0 - 100 mmol/L   Hemoglobin 16.0 (*) 12.0 - 15.0 g/dL   HCT 62.9 (*) 52.8 - 41.3 %  URINALYSIS, ROUTINE W REFLEX MICROSCOPIC     Status: Normal   Collection Time   01/04/12  4:07 PM      Component Value Range   Color, Urine YELLOW  YELLOW   APPearance CLEAR  CLEAR   Specific Gravity, Urine 1.020  1.005 - 1.030   pH 6.5  5.0 - 8.0   Glucose, UA NEGATIVE  NEGATIVE mg/dL   Hgb urine dipstick NEGATIVE  NEGATIVE   Bilirubin Urine NEGATIVE  NEGATIVE   Ketones, ur NEGATIVE  NEGATIVE mg/dL   Protein, ur NEGATIVE  NEGATIVE mg/dL   Urobilinogen, UA 0.2  0.0 - 1.0 mg/dL   Nitrite NEGATIVE  NEGATIVE   Leukocytes, UA NEGATIVE  NEGATIVE    Assessment:   Principal Problem:  *Snake bite Pt is a 61 yo female who presents following a copperhead bite to the right hand.  Plan:   1. Snake bite: Pt received crofab antivenom in the ED. The swelling has reduced significantly since the administration of the antivenom.  We will treat her according to the [facility] guidelines for snake bites.   -admit to step-down for close monitoring  -no further antivenom treatment at this time as the patient is responding well to  the treatment she had in the ED and the swelling is decreasing. Consider further antivenom if patient condition declines.   -measure circumfrences of arm and hand at 4 places every 2 hours initially and then every 4 hours if swelling is not increasing (hand above thumb, hand below thumb, one inch above wrist, mid-forearm). Areas to measure are properly marked.  -elevate hand and arm above heart  -obtain PT, fibrinogen, D-dimer, CBC at 8pm (at least 6 hours after bite)  -monitor for systemic signs of venom or reaction to antivenom(vomiting, hypotension, bleeding, increased swelling)  -monitor for compartment syndrome of hand/arm (monitor hand swelling, perfusion, sensation)  -ensure tetanus immunization is up to date  2. Chronic pain: Pt takes vicodin (10/325) BID at home for chronic back pain. Received dilaudid at ED. Will start Morphine and reevaluate the need for pain meds.   3. SVT: Pt in normal sinus rhythym at this time. Will continue home atenolol and verapamil.  4. FEN/GI: Pt to have regular diet and continue IV NS at 125 ml/hr.  5. DVT prophylaxis:  SCD's   6. Dispo: Possible dispo home tomorrow pending improvement in clinical status.   Teaching Service Addendum. I have seen and evaluated this pt and agree with MS note. My addended note is as follows.  Pt with hx of SVT and multiple orthopedic surgeries that comes today to ED after been bitten by a cooperhead snake at 1:20 pm. She was weeding a bed of flowers in her garden and felt sharp sting on her middle finger of her right hand. Then she saw two bleeding punctured wounds. After looking for what had bitten her,  she saw a small cooperhead (pt seem very knowledgeable about types of snakes as she was describing types of snake that are frequently seen in this area). The pain was intensifying, she did not take any medication for it. She drove herself to halfway from [place] when she meet her husband who brought her here. On ED the  swelling and the pain was significant. She also had nausea with no vomiting. [facility] was contacted and they recommended CroFab. A dose of this medication was given and  we were called to admit for further evaluation and treatment.  At the time we see the patient, she was very much improved. Her swelling was better and no systemic symptoms were present. She denied headache, chest pain, palpitations, SOB, nausea or vomiting. Pt pain was under control after dilaudid.   Physical exam: Filed Vitals:   01/04/12 1829  BP: 122/55  Pulse: 70  Temp:   Resp: 18   Gen:  No in acute distress. Cooperative with physical exam. HEENT: Moist mucous membranes. Oropharynx no erythema no exudates, no erythema. Dentures present.    CV: Regular rate and rhythm, no murmurs rubs or gallops. PULM: Clear to auscultation bilaterally. No wheezes/rales or rhonchi ABD: Soft, non tender, non distended, normal bowel sounds.  EXT: Upper right extremity with very significant swelling. Measurements very detailed on MS note. Radial pulse present . Distal capillary refill less than 3 sec. Distal sensation intact. Right LE with unna boot  Neuro: Grossly intact. No neurologic focalization.   Assessment and Plan: [name] is a 61 y.o.  female admitted after a snake bite of right 3rd  finger.   1. Snake bite: coag labs obtained during ED course were wnl. No ddimer or fibrinogen obtained yet. Recommendations are to obtain at least 6h after bite.  - Admit to inpatient: stepdown since monitoring is very closely after given CroFab.  - Labs every 6 hours: CBC to trend platelets, fibrinogen, prothrombin. If stable and pt without worsening or sign of systemic symptoms/signs this labs should be repeated at 72 hours.   - Elevate extremity over heart level. - Measurements every 2 hours.  - Pain control with morphine 2mg  Q4h PRN - Will hold ASA until tomorrow and reevaluate. Possible restart if al coags are wnl.   2. SVT: rate  controlled on verapamil and atenolol.  Pt reports to be in simvastatin from her cardiologist Tresa Endo at [facility]) but that she does not have problems with cholesterol.  - Will continue her home meds. (known CroFab and B blocker have to be used with caution)  3. Chronic pain: Pt with 14 orthopedic surgeries. Her 20 mg of vicodin she daily takes,  is equivalent to 1 mg of dilaudid or 6.4 mg of morphine. Pt has received 7 mg of dilaudid.  - Will start with morphine and reevaluate.   4. Hx of recent accidental Right LE cut: (7 weeks ago) with machete that has not healed. Pt has unna boot and is f/u by Dr. Magnus Ivan (wound care specialist at [facility]) - Possible wound consult   FEN/G: NPO for now.  IVF at 169ml/h Prophylaxis: SCD's   Disposition: pending improvement of pt symptoms and labs. Pt lives with husband at home, likely d/c home if no complications.   D. [place] Rolene Arbour, MD Family Medicine  PGY-2

## 2012-01-05 ENCOUNTER — Encounter (HOSPITAL_COMMUNITY): Payer: Self-pay | Admitting: *Deleted

## 2012-01-05 DIAGNOSIS — T6391XA Toxic effect of contact with unspecified venomous animal, accidental (unintentional), initial encounter: Secondary | ICD-10-CM | POA: Diagnosis not present

## 2012-01-05 LAB — CBC
HCT: 42.5 % (ref 36.0–46.0)
Hemoglobin: 14.1 g/dL (ref 12.0–15.0)
Hemoglobin: 14.1 g/dL (ref 12.0–15.0)
MCH: 33 pg (ref 26.0–34.0)
MCHC: 33.2 g/dL (ref 30.0–36.0)
MCHC: 33.2 g/dL (ref 30.0–36.0)
MCV: 99.8 fL (ref 78.0–100.0)
MCV: 99.5 fL (ref 78.0–100.0)
Platelets: 177 10*3/uL (ref 150–400)
RBC: 4.27 MIL/uL (ref 3.87–5.11)
RDW: 12.9 % (ref 11.5–15.5)

## 2012-01-05 LAB — PROTIME-INR
INR: 1.06 (ref 0.00–1.49)
Prothrombin Time: 14.3 seconds (ref 11.6–15.2)

## 2012-01-05 LAB — FIBRINOGEN: Fibrinogen: 306 mg/dL (ref 204–475)

## 2012-01-05 MED ORDER — ONDANSETRON HCL 4 MG/2ML IJ SOLN: 4.0000 mg | Freq: Three times a day (TID) | INTRAMUSCULAR | Status: DC | PRN

## 2012-01-05 MED ORDER — BIOTENE DRY MOUTH MT LIQD
15.0000 mL | Freq: Two times a day (BID) | OROMUCOSAL | Status: DC
  Administered 2012-01-05 – 2012-01-06 (×3): 15 mL via OROMUCOSAL

## 2012-01-05 MED ORDER — HYDROCODONE-ACETAMINOPHEN 10-325 MG PO TABS
1.0000 | ORAL_TABLET | ORAL | Status: DC | PRN
  Administered 2012-01-06: 1 via ORAL
  Filled 2012-01-05: qty 1

## 2012-01-05 MED ORDER — HYDROMORPHONE HCL PF 1 MG/ML IJ SOLN: 0.2000 mg | INTRAMUSCULAR | Status: DC | PRN

## 2012-01-05 NOTE — Discharge Summary (Signed)
Physician Discharge Summary  Patient ID: [name] MRN: [id number] DOB/AGE: 08-19-1950 61 y.o.  Admit date: 01/04/2012 Discharge date: 01/06/2012  Admission Diagnoses: snake bite  Discharge Diagnoses:  Principal Problem:  *Snake bite   Discharged Condition: improved  Hospital Course: Patient is a 61 y.o. female who presented to the ED with a copperhead bite to the right hand. 1. Snake bite: In the ED, the [facility] was called. [facility] advised giving 4 vials of cro-fab antivenom to the patient. She received the cro-fab in the ED and then was admitted to the step down unit. She was monitored closely for increased swelling of the right hand/arm, and her coags were monitored closely overnight. Her PT, INR, platelets remained stable over the course of her admission. The patient's pain was well controlled with a dilaudid PCA. On hospital day 1, the patient was changed to oral pain medications. She received her home dose of 10/325 norco and the pain in her hand was relieved. The swelling in her hand was improved and she had regained functional use of the hand. The patient was discharged on day 2 with follow-up arranged.  2. SVT: The patient received her home medications (verapamil 240 mg, atenolol 25 mg BID). The patient had no SVT episodes during the admission.   Consults:   Hand surgery consulted for evaluation of bite. Recommended elevation and a follow-up appointment in clinic.  Wound care consulted for management of bite. Deferred to hand surgery.   Treatments:  crofab antivenom  Discharge Exam:  Filed Vitals:   01/06/12 0447  BP: 106/50  Pulse: 74  Temp: 99.1 F (37.3 C)  Resp: 18   Physical exam:  Gen: No in acute distress. Cooperative with physical exam.  HEENT: Moist mucous membranes. Oropharynx no erythema no exudates, no erythema. Dentures present.  CV: Regular rate and rhythm, no murmurs rubs or gallops.  PULM: Clear to auscultation bilaterally. No  wheezes/rales or rhonchi  ABD: Soft, non tender, non distended, normal bowel sounds.  EXT: Right hand with swelling, most pronounced in the middle finger. Radial pulses present and equal bilaterally. Right middle finger with bullae, red/blue discoloration. Sensation intact.  Neuro: Grossly intact. No neurologic focalization.   Disposition: 01-Home or Self Care Scheduled Meds:   Medication List     As of 01/06/2012  9:05 AM    ASK your doctor about these medications         aspirin 325 MG tablet   Take 325 mg by mouth daily.      atenolol 50 MG tablet   Commonly known as: TENORMIN   Take 25 mg by mouth 2 (two) times daily.      atorvastatin 20 MG tablet   Commonly known as: LIPITOR   Take 20 mg by mouth daily.      baclofen 20 MG tablet   Commonly known as: LIORESAL   Take 20 mg by mouth 3 (three) times daily.      CALCIUM + D PO   Take by mouth.      chlorhexidine 0.12 % solution   Commonly known as: PERIDEX   Use as directed 15 mLs in the mouth or throat 2 (two) times daily.      clonazePAM 1 MG tablet   Commonly known as: KLONOPIN   Take 1 mg by mouth at bedtime.      fish oil-omega-3 fatty acids 1000 MG capsule   Take 2 g by mouth daily.      HYDROcodone-acetaminophen  10-325 MG per tablet   Commonly known as: NORCO   Take 1 tablet by mouth every 4 (four) hours as needed. For pain      PROBIOTIC DAILY PO   Take 1 tablet by mouth daily.      sertraline 100 MG tablet   Commonly known as: ZOLOFT   Take 100 mg by mouth daily.      verapamil 240 MG (CO) 24 hr tablet   Commonly known as: COVERA HS   Take 240 mg by mouth at bedtime.         Follow-up With Details Comments Contact Info  [place] Thursday 01/07/2012. call office for hour of appointment [address] [place] [address] [phone]  [place], [place] On 01/13/2012 at 8:30 am [address] [place] [address] [phone]  Nursing visit for labs On 01/07/2012 at  9:45 am [address] [place]. [address]   Follow up issues:  1. Follow-up appointment scheduled with hand surgery clinic for tomorrow. 2. Needs coag labs drawn tomorrow (72 hours after crofab administration). Labs to draw: INR, fibrinogen level and CBC (platelets).    Signed: Myrtha Mantis 01/06/12, 9:07AM  I have reviewed above MSIV discharge summary and made necessary revisions.   Dessa Phi, MD [facility] Teaching Service 01/06/12, 4:28 PM

## 2012-01-05 NOTE — Consult Note (Signed)
Wound consult requested but hand surgeon is following for assessment and plan of care, and plans to see as outpatient after discharge.  Please contact this team if further questions. Will not plan to follow further unless re-consulted.  [address] RN, MSN, [employer]  [phone]

## 2012-01-05 NOTE — Progress Notes (Signed)
Inpatient Progress Note Family Medicine Teaching Service 01/05/12  Subjective: Pt continues to have pain in the right hand from the snake bite. She reports that the dilaudid relieves her pain well. She also has itching in the forearm. She denies abdominal pain, nausea. Thinks that her hand is less painful than yesterday but still very painful today.  Objective: Filed Vitals:   01/05/12 0435 01/05/12 0511 01/05/12 0757 01/05/12 0850  BP:  106/51  109/60  Pulse:  59  67  Temp:  98.4 F (36.9 C)  98.6 F (37 C)  TempSrc:  Oral  Oral  Resp: 15 18 20 28   Height:      Weight:      SpO2: 100% 99% 99% 98%      Physical exam: Gen: No in acute distress. Cooperative with physical exam.  HEENT: Moist mucous membranes. Oropharynx no erythema no exudates, no erythema. Dentures present.  CV: Regular rate and rhythm, no murmurs rubs or gallops.  PULM: Clear to auscultation bilaterally. No wheezes/rales or rhonchi  ABD: Soft, non tender, non distended, normal bowel sounds.  EXT: Upper right extremity with very significant swelling. Radial pulse present . Distal capillary refill less than 3 sec. Distal sensation intact. Right middle finger with blisters over bite marks. Fingers and forearm dusky in color.  Right LE with unna boot  Neuro: Grossly intact. No neurologic focalization.    Lab Results  Component Value Date   INR 1.06 01/05/2012   INR 1.07 01/04/2012   INR 1.01 01/04/2012   Lab Results  Component Value Date   WBC 8.5 01/05/2012   HGB 14.1 01/05/2012   HCT 42.5 01/05/2012   MCV 99.8 01/05/2012   PLT 195 01/05/2012   D-dimer: 0.9 (9:53PM 9/9) Fibrinogen: 325 (9:48PM 9/9), 306 (4:10AM 9/10)  Studies/Results: Dg Chest Port 1 View  01/04/2012  *RADIOLOGY REPORT*  Clinical Data: Snake bite on the right hand.  PORTABLE CHEST - 1 VIEW 01/04/2012 1540 hours:  Comparison: Two-view chest x-ray 01/29/2011 [facility], 01/21/2011 [place].  Findings: Cardiac silhouette upper normal in  size for the AP portable technique, unchanged.  Scarring at the left lung base at the site of the prior pneumonia.  Stable hyperinflation.  Lungs otherwise clear.  No pleural effusions.  IMPRESSION: Hyperinflation consistent with COPD and/or asthma.  Scarring at the left lung base.  No acute cardiopulmonary disease.   Original Report Authenticated By: Arnell Sieving, M.D.     Scheduled Meds:   . sodium chloride   Intravenous STAT  . antiseptic oral rinse  15 mL Mouth Rinse BID  . atenolol  25 mg Oral BID  . atorvastatin  20 mg Oral Daily  . crotalidae immune fab (CROFAB) infusion  4 vial Intravenous Once  .  HYDROmorphone (DILAUDID) injection  2 mg Intramuscular Once  .  HYDROmorphone (DILAUDID) injection  2 mg Intramuscular Once  . HYDROmorphone PCA 0.3 mg/mL   Intravenous Q4H  . ondansetron  8 mg Oral Once  . sertraline  100 mg Oral Daily  . verapamil  240 mg Oral QHS  . DISCONTD:  HYDROmorphone (DILAUDID) injection  1 mg Intravenous Once  . DISCONTD:  HYDROmorphone (DILAUDID) injection  2 mg Intramuscular Once  . DISCONTD: ondansetron (ZOFRAN) IV  4 mg Intravenous Once  . DISCONTD: ondansetron  8 mg Intramuscular Once  . DISCONTD: verapamil  240 mg Oral QHS   Continuous Infusions:   . sodium chloride 125 mL/hr at 01/05/12 0804  . DISCONTD: sodium chloride  160 mL/hr (01/04/12 1603)  . 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 1. Snake bite: coag labs have remained stable since admission. Will get one more set of labs at 10:00AM. If no changes, repeat labs tomorrow morning.   -Transfer to floor bed today  -Continue pain control with dilaudid pca  -continue to elevate right upper extremity above level of  heart.  -follow hand surgeon recs to continue elevation.  -f/u in hand surgeon clinic on Thursday  2. Pain: Pt takes vicodin (10/325) BID at home for chronic back pain. Received dilaudid in ED and was started on dilaudid PCA overnight for hand pain (0.4mg  q 4h).   -will continue to work on pain control today  3. SVT: Pt in normal sinus rhythym at this time. Will continue home atenolol and verapamil.   4. Hx of recent accidental Right LE cut: (7 weeks ago) with machete that has not healed. Pt has unna boot and is f/u by Dr. Magnus Ivan (wound care specialist at [facility]). Pt was supposed to see Dr. Magnus Ivan for removal of the unna boot tomorrow. - Possible wound consult to remove unna boot.  4. FEN/GI: d/c IV fluids today, start regular diet  5. DVT prophylaxis: SCD's   6. Dispo: Possible dispo home tomorrow if pain is under control and patient feels well. Will follow-up in hand surgery clinic on Thursday.     LOS: 1 day   Myrtha Mantis, [facility]   Teaching Service Addendum. I have seen and evaluated this pt and agree with MS note. My addended note is as follows.  Physical exam: Filed Vitals:   01/05/12 1036  BP: 146/92  Pulse: 68  Temp:   Resp:   Gen: No in acute distress. Cooperative with physical exam.  CV: Regular rate and rhythm, no murmurs PULM: Clear to auscultation bilaterally. No wheezes/rales or rhonchi  ABD: Soft, non tender, non distended, normal bowel sounds.  EXT: Upper right extremity with very significant swelling. Measurements very detailed on MS note. Radial pulse present . Distal capillary refill less than 3 sec. Distal sensation decreased Right LE with unna boot  Neuro: Grossly intact. No neurologic focalization.   Assessment and Plan:  ROMONIA KLINDT is a 61 y.o. female admitted after a snake bite of right 3rd finger.  1. Snake bite: evaluated by hand surgery and followed by poison control who recommends, symptomatic treatment an hand elevation. No CroFab  needed at this time since swelling has not changed. It is mostly coloration and bullae formation. - wound care consult . - pt will f/u as outpatient with hand surgeon next Thursday. - continue pain control management.  - .labs should be obtained at 72 h of CroFab therapy.   2. SVT: rate controlled on verapamil and atenolol. Pt reports to be in simvastatin from her cardiologist Tresa Endo at [facility]) but that she does not have problems with cholesterol.   3. Hx of recent accidental Right LE cut: (7 weeks ago) with machete that has not healed. Pt has unna boot and is f/u by Dr. Magnus Ivan (wound care specialist at [facility])  - wound consult   FEN/G: NPO for now. IVF at 170ml/h  Prophylaxis: SCD's  Disposition: pending improvement.   D. [place] Rolene Arbour, MD Family Medicine  PGY-2

## 2012-01-05 NOTE — Progress Notes (Signed)
1645 transfer in from 2600 no apparent distress noted kept  comfortable in bed . Spouse in attendance

## 2012-01-05 NOTE — H&P (Signed)
FMTS Attending Admission Note: Renold Don MD Personal pager:  [phone] FPTS Service Pager:  [phone]  I  have seen and examined this patient, reviewed their chart. I have discussed this patient with the resident. I agree with the resident's findings, assessment and care plan.  Briefly, 61 year old female with snakebite that occurred yesterday afternoon.  Patient clear it was baby copperhead.  Came to ED due to extensive pain and swelling.  [place] Poison control consulted, recommended Crofab administration.  Swelling up forearm marked in ED.  Hand surgery consulted and examined patient, recommended to continue elevation and NSAIDs.  This AM still with extensive pain and swelling.  Exam: Gen:  Alert, cooperative patient who appears stated age in no acute distress.  Vital signs reviewed. Cardiac:  Regular rate and rhythm without murmur auscultated.  Good S1/S2. Pulm:  Clear to auscultation bilaterally with good air movement.  No wheezes or rales noted.   Abdomen:  Soft/nondistended/nontender Ext:  Right arm with markings indicated extent of swelling.  Erythema has receded from this markings.  3rd digit Right hand still with considerable swelling and discoloration, edema extends into palm of hand and 2nd and 4th digit.  Cap refil ~3 seconds and anesthesia distal tip of long finger.  Very tender to palpation.  2 mm blister noted lateral aspect and just inferior to PIP joint.   Neuro:  Alert and oriented  Imp/Plan: 1.  Snakebite:  Continue pain control.  Only received 1 dose of Crofab.  No systemic symptoms by history, physical, or laboratory values.  Swelling in arm improved.  However per patient and husband, swelling and discoloration in finger have worsened.  Plan to re-consult poison control, she may need another dose of Crofab despite lack of systemic symptoms in light of worsening swelling.  No immediate hypersensitivity noted, needs to be monitored for ~24 hours after next Crofab  administration.  Patient adamant it was copperhead, seems to be poisonous snakebite.  She is up to date on tetanus.  Adding Clindamycin to prevent any infection from puncture wounds.    Tobey Grim, MD

## 2012-01-05 NOTE — Consult Note (Signed)
Reason for Consult:right hand snakebite Referring Physician: family practice  [name] is an 61 y.o. female.  HPI: s/p snakebite to right hand yesterday  Past Medical History  Diagnosis Date  . Anxiety   . Arthritis   . Depression   . PSVT (paroxysmal supraventricular tachycardia)   . Heart murmur   . Back pain, chronic     Past Surgical History  Procedure Date  . Tubal ligation   . Cervical disc surgery   . Colectomy   . Abdominal hysterectomy   . Knee surgery   . Tonsillectomy   . Spine surgery 1983-01/2007  . Back surgery     16 back surgery  . Breast surgery 1976    breast reduction    Family History  Problem Relation Age of Onset  . COPD Father   . Diabetes Mother   . Heart disease Mother   . Kidney disease Mother   . Hypertension Mother     Social History:  reports that she quit smoking about 3 years ago. She has quit using smokeless tobacco. She reports that she drinks alcohol. She reports that she does not use illicit drugs.  Allergies:  Allergies  Allergen Reactions  . Bupropion Hcl Swelling  . Morphine Itching    Medications:  Prior to Admission:  Prescriptions prior to admission  Medication Sig Dispense Refill  . aspirin 325 MG tablet Take 325 mg by mouth daily.      Marland Kitchen atenolol (TENORMIN) 50 MG tablet Take 25 mg by mouth 2 (two) times daily.      Marland Kitchen atorvastatin (LIPITOR) 20 MG tablet Take 20 mg by mouth daily.      . baclofen (LIORESAL) 20 MG tablet Take 20 mg by mouth 3 (three) times daily.       . Calcium Carbonate-Vitamin D (CALCIUM + D PO) Take by mouth.      . chlorhexidine (PERIDEX) 0.12 % solution Use as directed 15 mLs in the mouth or throat 2 (two) times daily.       . clonazePAM (KLONOPIN) 1 MG tablet Take 1 mg by mouth at bedtime.      . fish oil-omega-3 fatty acids 1000 MG capsule Take 2 g by mouth daily.      Marland Kitchen HYDROcodone-acetaminophen (NORCO) 10-325 MG per tablet Take 1 tablet by mouth every 4 (four) hours as needed. For  pain      . Probiotic Product (PROBIOTIC DAILY PO) Take 1 tablet by mouth daily.      . sertraline (ZOLOFT) 100 MG tablet Take 100 mg by mouth daily.      . verapamil (COVERA HS) 240 MG (CO) 24 hr tablet Take 240 mg by mouth at bedtime.        Results for orders placed during the hospital encounter of 01/04/12 (from the past 48 hour(s))  CBC WITH DIFFERENTIAL     Status: Abnormal   Collection Time   01/04/12  3:10 PM      Component Value Range Comment   WBC 5.0  4.0 - 10.5 K/uL    RBC 4.63  3.87 - 5.11 MIL/uL    Hemoglobin 15.6 (*) 12.0 - 15.0 g/dL    HCT 16.1  09.6 - 04.5 %    MCV 99.1  78.0 - 100.0 fL    MCH 33.7  26.0 - 34.0 pg    MCHC 34.0  30.0 - 36.0 g/dL    RDW 40.9  81.1 - 91.4 %    Platelets  174  150 - 400 K/uL    Neutrophils Relative 54  43 - 77 %    Neutro Abs 2.7  1.7 - 7.7 K/uL    Lymphocytes Relative 35  12 - 46 %    Lymphs Abs 1.8  0.7 - 4.0 K/uL    Monocytes Relative 9  3 - 12 %    Monocytes Absolute 0.5  0.1 - 1.0 K/uL    Eosinophils Relative 1  0 - 5 %    Eosinophils Absolute 0.1  0.0 - 0.7 K/uL    Basophils Relative 0  0 - 1 %    Basophils Absolute 0.0  0.0 - 0.1 K/uL   PROTIME-INR     Status: Normal   Collection Time   01/04/12  3:10 PM      Component Value Range Comment   Prothrombin Time 13.5  11.6 - 15.2 seconds    INR 1.01  0.00 - 1.49   APTT     Status: Normal   Collection Time   01/04/12  3:10 PM      Component Value Range Comment   aPTT 34  24 - 37 seconds   POCT I-STAT, CHEM 8     Status: Abnormal   Collection Time   01/04/12  3:37 PM      Component Value Range Comment   Sodium 141  135 - 145 mEq/L    Potassium 4.3  3.5 - 5.1 mEq/L    Chloride 105  96 - 112 mEq/L    BUN 22  6 - 23 mg/dL    Creatinine, Ser 9.60  0.50 - 1.10 mg/dL    Glucose, Bld 95  70 - 99 mg/dL    Calcium, Ion 4.54  0.98 - 1.23 mmol/L    TCO2 25  0 - 100 mmol/L    Hemoglobin 16.0 (*) 12.0 - 15.0 g/dL    HCT 11.9 (*) 14.7 - 46.0 %   URINALYSIS, ROUTINE W REFLEX MICROSCOPIC      Status: Normal   Collection Time   01/04/12  4:07 PM      Component Value Range Comment   Color, Urine YELLOW  YELLOW    APPearance CLEAR  CLEAR    Specific Gravity, Urine 1.020  1.005 - 1.030    pH 6.5  5.0 - 8.0    Glucose, UA NEGATIVE  NEGATIVE mg/dL    Hgb urine dipstick NEGATIVE  NEGATIVE    Bilirubin Urine NEGATIVE  NEGATIVE    Ketones, ur NEGATIVE  NEGATIVE mg/dL    Protein, ur NEGATIVE  NEGATIVE mg/dL    Urobilinogen, UA 0.2  0.0 - 1.0 mg/dL    Nitrite NEGATIVE  NEGATIVE    Leukocytes, UA NEGATIVE  NEGATIVE MICROSCOPIC NOT DONE ON URINES WITH NEGATIVE PROTEIN, BLOOD, LEUKOCYTES, NITRITE, OR GLUCOSE <1000 mg/dL.  MRSA PCR SCREENING     Status: Normal   Collection Time   01/04/12  8:56 PM      Component Value Range Comment   MRSA by PCR NEGATIVE  NEGATIVE   FIBRINOGEN     Status: Normal   Collection Time   01/04/12  8:59 PM      Component Value Range Comment   Fibrinogen 325  204 - 475 mg/dL   D-DIMER, QUANTITATIVE     Status: Abnormal   Collection Time   01/04/12  8:59 PM      Component Value Range Comment   D-Dimer, Quant 0.90 (*) 0.00 - 0.48 ug/mL-FEU  CBC     Status: Abnormal   Collection Time   01/04/12  8:59 PM      Component Value Range Comment   WBC 8.7  4.0 - 10.5 K/uL    RBC 4.40  3.87 - 5.11 MIL/uL    Hemoglobin 14.6  12.0 - 15.0 g/dL    HCT 16.1  09.6 - 04.5 %    MCV 100.2 (*) 78.0 - 100.0 fL    MCH 33.2  26.0 - 34.0 pg    MCHC 33.1  30.0 - 36.0 g/dL    RDW 40.9  81.1 - 91.4 %    Platelets 166  150 - 400 K/uL   PROTIME-INR     Status: Normal   Collection Time   01/04/12  8:59 PM      Component Value Range Comment   Prothrombin Time 14.1  11.6 - 15.2 seconds    INR 1.07  0.00 - 1.49   FIBRINOGEN     Status: Normal   Collection Time   01/05/12  3:20 AM      Component Value Range Comment   Fibrinogen 306  204 - 475 mg/dL   CBC     Status: Normal   Collection Time   01/05/12  3:20 AM      Component Value Range Comment   WBC 8.5  4.0 - 10.5 K/uL    RBC  4.26  3.87 - 5.11 MIL/uL    Hemoglobin 14.1  12.0 - 15.0 g/dL    HCT 78.2  95.6 - 21.3 %    MCV 99.8  78.0 - 100.0 fL    MCH 33.1  26.0 - 34.0 pg    MCHC 33.2  30.0 - 36.0 g/dL    RDW 08.6  57.8 - 46.9 %    Platelets 195  150 - 400 K/uL   PROTIME-INR     Status: Normal   Collection Time   01/05/12  3:20 AM      Component Value Range Comment   Prothrombin Time 14.0  11.6 - 15.2 seconds    INR 1.06  0.00 - 1.49     Dg Chest Port 1 View  01/04/2012  *RADIOLOGY REPORT*  Clinical Data: Snake bite on the right hand.  PORTABLE CHEST - 1 VIEW 01/04/2012 1540 hours:  Comparison: Two-view chest x-ray 01/29/2011 [facility], 01/21/2011 [place].  Findings: Cardiac silhouette upper normal in size for the AP portable technique, unchanged.  Scarring at the left lung base at the site of the prior pneumonia.  Stable hyperinflation.  Lungs otherwise clear.  No pleural effusions.  IMPRESSION: Hyperinflation consistent with COPD and/or asthma.  Scarring at the left lung base.  No acute cardiopulmonary disease.   Original Report Authenticated By: Arnell Sieving, M.D.     Review of Systems  All other systems reviewed and are negative.   Blood pressure 106/51, pulse 59, temperature 98.4 F (36.9 C), temperature source Oral, resp. rate 18, height 5\' 8"  (1.727 m), weight 55 kg (121 lb 4.1 oz), SpO2 99.00%. Physical Exam  Constitutional: She is oriented to person, place, and time. She appears well-developed and well-nourished.  HENT:  Head: Normocephalic and atraumatic.  Cardiovascular: Normal rate.   Respiratory: Effort normal.  Musculoskeletal:       Right wrist: She exhibits tenderness and swelling.       Arms: Neurological: She is alert and oriented to person, place, and time.  Skin: Skin is warm.  Psychiatric: She has a normal  mood and affect. Her behavior is normal. Judgment and thought content normal.    Assessment/Plan: patient examined at bedside this am  Global swelling of right  wrist distal forearm and long finger but no signs of neurovascular compromise  Would continue elevation, NAISADs and follow up in my office this Thursday  Call if situation worsens  Aryahi Denzler A 01/05/2012, 7:37 AM

## 2012-01-05 NOTE — Progress Notes (Signed)
Utilization Review Completed.  [name]  01/05/2012  

## 2012-01-05 NOTE — Progress Notes (Signed)
Report called to [facility] on unit 4700. Patient made aware of transfer. Husband at bedside. Patient transferred via wheelchair to RM 4742.

## 2012-01-06 ENCOUNTER — Telehealth: Payer: Self-pay | Admitting: Internal Medicine

## 2012-01-06 DIAGNOSIS — I890 Lymphedema, not elsewhere classified: Secondary | ICD-10-CM | POA: Diagnosis not present

## 2012-01-06 DIAGNOSIS — L97209 Non-pressure chronic ulcer of unspecified calf with unspecified severity: Secondary | ICD-10-CM | POA: Diagnosis not present

## 2012-01-06 DIAGNOSIS — T6391XA Toxic effect of contact with unspecified venomous animal, accidental (unintentional), initial encounter: Secondary | ICD-10-CM | POA: Diagnosis not present

## 2012-01-06 MED ORDER — INFLUENZA VIRUS VACC SPLIT PF IM SUSP: 0.5000 mL | INTRAMUSCULAR | Status: DC

## 2012-01-06 MED ORDER — INFLUENZA VIRUS VACC SPLIT PF IM SUSP
0.5000 mL | Freq: Once | INTRAMUSCULAR | Status: DC
  Filled 2012-01-06 (×2): qty 0.5

## 2012-01-06 NOTE — Progress Notes (Signed)
PCA pump d/c.  Not able to waste extra dilaudid in pyxis.  3.6mg  dilaudid from PCA pump wasted down the sink with Ninetta Lights, RN.    Courtney Heys, RN

## 2012-01-06 NOTE — Progress Notes (Signed)
Pt requested d/c PCA. Page MD . MD d/c PCA. PT set on PRN oxy and Dildd.

## 2012-01-06 NOTE — Progress Notes (Signed)
Inpatient Progress Note Family Medicine Teaching Service 01/06/12  Subjective: Pt feels much better today. She reports that she is not in much pain at all and that her morning dose of norco relieved the pain almost completely. She has limited, but improved, use of her right hand. She feels ready to go home today.  Objective: Filed Vitals:   01/05/12 2000 01/05/12 2109 01/06/12 0013 01/06/12 0447  BP:  108/54  106/50  Pulse:  72  74  Temp:  98.3 F (36.8 C)  99.1 F (37.3 C)  TempSrc:  Oral  Oral  Resp: 16 18 18 18   Height:      Weight:    118 lb (53.524 kg)  SpO2: 96% 99% 96% 96%      Physical exam: Gen: No in acute distress. Cooperative with physical exam.  HEENT: Moist mucous membranes. Oropharynx no erythema no exudates, no erythema. Dentures present.  CV: Regular rate and rhythm, no murmurs rubs or gallops.  PULM: Clear to auscultation bilaterally. No wheezes/rales or rhonchi  ABD: Soft, non tender, non distended, normal bowel sounds.  EXT: Right hand with swelling, most pronounced in the middle finger. Radial pulses present and equal bilaterally. Right middle finger with bullae, red/blue discoloration. Sensation intact. Neuro: Grossly intact. No neurologic focalization.    Lab Results  Component Value Date   INR 1.09 01/05/2012   INR 1.06 01/05/2012   INR 1.07 01/04/2012   Lab Results  Component Value Date   WBC 7.4 01/05/2012   HGB 14.1 01/05/2012   HCT 42.5 01/05/2012   MCV 99.5 01/05/2012   PLT 177 01/05/2012   D-dimer: 0.9 (9:53PM 9/9) Fibrinogen: 325 (9:48PM 9/9), 306 (4:10AM 9/10)    Scheduled Meds:    . sodium chloride   Intravenous STAT  . antiseptic oral rinse  15 mL Mouth Rinse BID  . atenolol  25 mg Oral BID  . atorvastatin  20 mg Oral Daily  . sertraline  100 mg Oral Daily  . verapamil  240 mg Oral QHS  . DISCONTD: HYDROmorphone PCA 0.3 mg/mL   Intravenous Q4H   Continuous Infusions:    . 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 1. Snake bite: Bite site is much improved since yesterday. Swelling is reduced and the pain is more localized to the middle finger now. She is able to use her hand today. She was taken off the PCA dilaudid overnight because she felt like she did not need it. She took her usual home dose of hydrocodone/acetominophen 10/325 this morning and feels like she does not need any other pain medications. She has an appointment to follow-up with the hand surgeon tomorrow.   2. SVT: Pt in normal sinus rhythym at this time. Will continue home atenolol and verapamil.   3. Hx of recent accidental Right LE cut: pt to follow up with her wound care specialist in [place] upon discharge.  4. FEN/GI:  start regular diet  5. DVT prophylaxis: SCD's   6. Dispo: Plan for d/c home today with appointment to follow up with hand surgeon tomorrow. She will need her coag labs repeated tomorrow (72 hours after crofab administration). If this cannot be done at the hand surgeon, will arrange follow up in family practice clinic.     LOS: 2 days  Myrtha Mantis, MSIV   PGY3 Addendum to [facility] note:  I agree with the above. Briefly, 61 yo F presented with copperhead snake bite to 3rd digiit of R hand who had systemic symptoms in the ED (nausea) and was treated with CroFab x 1. She was admitted for coag monitoring and pain control. Her coags have been normal. Her pain has been controlled, she is now well controlled on her home dose of vicodin. Her exam is pertinent for edema and ecchymoses and bullae at the tip of the third digit in her R hand. Vital signs within normal limits. Plan for discharge to home today with vicodin and  outpatient /fu with hand surgery and wound care.   Kristine Tiley 12:09 PM, 01/06/12

## 2012-01-06 NOTE — Telephone Encounter (Signed)
Dr. Aviva Signs, I rescheduled the lab only appt with [place] because this patient's primary would not schedule her for lab only this week due to her primary not having anything available.  They said since she hasn't been seen in a while they must see her for a office visit as well.  The earliest we could get her in for her hospital f/u was next Thursday.  However, with Dr. Donnetta Hail permission I made her INR appt with [place].  [facility] does not perform any labs at all.  Kendal Hymen will fax the results to her physician's office.

## 2012-01-06 NOTE — Progress Notes (Signed)
Patient is currently being discharged, tele and IV discontinued.  Patient verbalizes understanding of discharge instructions. Pharmacy called twice and notified for patient's influenza vaccine, patient couldn't wait any longer for injections due to MD appointment, so she did not receive flu vaccine.  Lorretta Harp RN

## 2012-01-06 NOTE — Progress Notes (Signed)
Wasted 3.6mg  of Dilaudid from PCA pump.Ninetta Lights RN wasted with Courtney Heys RN

## 2012-01-06 NOTE — Discharge Summary (Signed)
Seen and examined.  I agree with the DC today as outlined by Dr. Armen Pickup.

## 2012-01-07 ENCOUNTER — Ambulatory Visit (INDEPENDENT_AMBULATORY_CARE_PROVIDER_SITE_OTHER): Payer: Medicare Other | Admitting: *Deleted

## 2012-01-07 ENCOUNTER — Other Ambulatory Visit: Payer: BC Managed Care – PPO

## 2012-01-07 DIAGNOSIS — W5911XA Bitten by nonvenomous snake, initial encounter: Secondary | ICD-10-CM

## 2012-01-07 DIAGNOSIS — T6391XA Toxic effect of contact with unspecified venomous animal, accidental (unintentional), initial encounter: Secondary | ICD-10-CM

## 2012-01-07 DIAGNOSIS — S61409A Unspecified open wound of unspecified hand, initial encounter: Secondary | ICD-10-CM | POA: Diagnosis not present

## 2012-01-07 DIAGNOSIS — T63001A Toxic effect of unspecified snake venom, accidental (unintentional), initial encounter: Secondary | ICD-10-CM

## 2012-01-07 NOTE — Progress Notes (Signed)
Pt came to [facility] for INR check prior to seeing the hand surgeon this pm,   INR =1.0 Report was printed for pt to take with her to hand surgeon and report was faxed to Dr. Shary Decamp @ [facility]

## 2012-01-13 ENCOUNTER — Inpatient Hospital Stay: Payer: BC Managed Care – PPO | Admitting: Family Medicine

## 2012-01-14 DIAGNOSIS — G890 Central pain syndrome: Secondary | ICD-10-CM | POA: Diagnosis not present

## 2012-01-14 DIAGNOSIS — M47817 Spondylosis without myelopathy or radiculopathy, lumbosacral region: Secondary | ICD-10-CM | POA: Diagnosis not present

## 2012-01-14 DIAGNOSIS — M47812 Spondylosis without myelopathy or radiculopathy, cervical region: Secondary | ICD-10-CM | POA: Diagnosis not present

## 2012-01-14 DIAGNOSIS — M961 Postlaminectomy syndrome, not elsewhere classified: Secondary | ICD-10-CM | POA: Diagnosis not present

## 2012-01-14 DIAGNOSIS — S61409A Unspecified open wound of unspecified hand, initial encounter: Secondary | ICD-10-CM | POA: Diagnosis not present

## 2012-01-19 DIAGNOSIS — H2510 Age-related nuclear cataract, unspecified eye: Secondary | ICD-10-CM | POA: Diagnosis not present

## 2012-01-19 DIAGNOSIS — H524 Presbyopia: Secondary | ICD-10-CM | POA: Diagnosis not present

## 2012-01-20 DIAGNOSIS — T6391XA Toxic effect of contact with unspecified venomous animal, accidental (unintentional), initial encounter: Secondary | ICD-10-CM | POA: Diagnosis not present

## 2012-01-20 DIAGNOSIS — R197 Diarrhea, unspecified: Secondary | ICD-10-CM | POA: Diagnosis not present

## 2012-04-04 DIAGNOSIS — Z23 Encounter for immunization: Secondary | ICD-10-CM | POA: Diagnosis not present

## 2012-04-07 DIAGNOSIS — M961 Postlaminectomy syndrome, not elsewhere classified: Secondary | ICD-10-CM | POA: Diagnosis not present

## 2012-04-07 DIAGNOSIS — G890 Central pain syndrome: Secondary | ICD-10-CM | POA: Diagnosis not present

## 2012-04-19 DIAGNOSIS — M76899 Other specified enthesopathies of unspecified lower limb, excluding foot: Secondary | ICD-10-CM | POA: Diagnosis not present

## 2012-06-30 DIAGNOSIS — M961 Postlaminectomy syndrome, not elsewhere classified: Secondary | ICD-10-CM | POA: Diagnosis not present

## 2012-06-30 DIAGNOSIS — G890 Central pain syndrome: Secondary | ICD-10-CM | POA: Diagnosis not present

## 2012-06-30 DIAGNOSIS — F17200 Nicotine dependence, unspecified, uncomplicated: Secondary | ICD-10-CM | POA: Diagnosis not present

## 2012-08-29 DIAGNOSIS — M47812 Spondylosis without myelopathy or radiculopathy, cervical region: Secondary | ICD-10-CM | POA: Diagnosis not present

## 2012-08-29 DIAGNOSIS — M961 Postlaminectomy syndrome, not elsewhere classified: Secondary | ICD-10-CM | POA: Diagnosis not present

## 2012-08-29 DIAGNOSIS — G890 Central pain syndrome: Secondary | ICD-10-CM | POA: Diagnosis not present

## 2012-08-29 DIAGNOSIS — Z79899 Other long term (current) drug therapy: Secondary | ICD-10-CM | POA: Diagnosis not present

## 2012-10-03 ENCOUNTER — Telehealth: Payer: Self-pay | Admitting: Cardiovascular Disease

## 2012-10-03 MED ORDER — ATORVASTATIN CALCIUM 10 MG PO TABS: 10.0000 mg | ORAL_TABLET | Freq: Every day | ORAL | Status: DC

## 2012-10-03 NOTE — Telephone Encounter (Signed)
[name] states that they have faxed and sent the request electronically on 06/3 and 06/4 and faxed it on 06/06 and sent it both ways on today 06/09 and have not had any response. It is on Atorvastatin-10mg  .. Please give them a call   Thanks

## 2012-11-21 DIAGNOSIS — IMO0002 Reserved for concepts with insufficient information to code with codable children: Secondary | ICD-10-CM | POA: Diagnosis not present

## 2012-11-21 DIAGNOSIS — M961 Postlaminectomy syndrome, not elsewhere classified: Secondary | ICD-10-CM | POA: Diagnosis not present

## 2012-11-21 DIAGNOSIS — M25559 Pain in unspecified hip: Secondary | ICD-10-CM | POA: Diagnosis not present

## 2012-11-21 DIAGNOSIS — M47812 Spondylosis without myelopathy or radiculopathy, cervical region: Secondary | ICD-10-CM | POA: Diagnosis not present

## 2012-11-28 ENCOUNTER — Other Ambulatory Visit: Payer: Self-pay | Admitting: Anesthesiology

## 2012-11-28 ENCOUNTER — Ambulatory Visit
Admission: RE | Admit: 2012-11-28 | Discharge: 2012-11-28 | Disposition: A | Discharge status: Home / Self Care | Payer: Medicare Other | Source: Ambulatory Visit | Attending: Anesthesiology | Admitting: Anesthesiology

## 2012-11-28 DIAGNOSIS — M169 Osteoarthritis of hip, unspecified: Secondary | ICD-10-CM | POA: Diagnosis not present

## 2012-11-28 DIAGNOSIS — R52 Pain, unspecified: Secondary | ICD-10-CM

## 2012-12-19 DIAGNOSIS — M25559 Pain in unspecified hip: Secondary | ICD-10-CM | POA: Diagnosis not present

## 2012-12-19 DIAGNOSIS — M47817 Spondylosis without myelopathy or radiculopathy, lumbosacral region: Secondary | ICD-10-CM | POA: Diagnosis not present

## 2012-12-19 DIAGNOSIS — Z79899 Other long term (current) drug therapy: Secondary | ICD-10-CM | POA: Diagnosis not present

## 2012-12-21 DIAGNOSIS — H524 Presbyopia: Secondary | ICD-10-CM | POA: Diagnosis not present

## 2012-12-21 DIAGNOSIS — H2510 Age-related nuclear cataract, unspecified eye: Secondary | ICD-10-CM | POA: Diagnosis not present

## 2013-01-09 ENCOUNTER — Other Ambulatory Visit: Payer: Self-pay | Admitting: *Deleted

## 2013-01-09 MED ORDER — ATENOLOL 50 MG PO TABS: 25.0000 mg | ORAL_TABLET | Freq: Two times a day (BID) | ORAL | Status: DC

## 2013-01-09 MED ORDER — VERAPAMIL HCL 240 MG (CO) PO TB24: 240.0000 mg | ORAL_TABLET | Freq: Every day | ORAL | Status: DC

## 2013-01-23 DIAGNOSIS — T148XXA Other injury of unspecified body region, initial encounter: Secondary | ICD-10-CM | POA: Diagnosis not present

## 2013-01-25 DIAGNOSIS — T148XXA Other injury of unspecified body region, initial encounter: Secondary | ICD-10-CM | POA: Diagnosis not present

## 2013-02-07 DIAGNOSIS — I872 Venous insufficiency (chronic) (peripheral): Secondary | ICD-10-CM | POA: Diagnosis not present

## 2013-02-07 DIAGNOSIS — IMO0001 Reserved for inherently not codable concepts without codable children: Secondary | ICD-10-CM | POA: Diagnosis not present

## 2013-02-07 DIAGNOSIS — T148XXA Other injury of unspecified body region, initial encounter: Secondary | ICD-10-CM | POA: Diagnosis not present

## 2013-02-07 DIAGNOSIS — L97209 Non-pressure chronic ulcer of unspecified calf with unspecified severity: Secondary | ICD-10-CM | POA: Diagnosis not present

## 2013-02-08 ENCOUNTER — Other Ambulatory Visit: Payer: Self-pay | Admitting: *Deleted

## 2013-02-08 ENCOUNTER — Telehealth: Payer: Self-pay | Admitting: Cardiovascular Disease

## 2013-02-08 MED ORDER — VERAPAMIL HCL 240 MG (CO) PO TB24: 240.0000 mg | ORAL_TABLET | Freq: Every day | ORAL | Status: DC

## 2013-02-08 MED ORDER — ATENOLOL 50 MG PO TABS: 25.0000 mg | ORAL_TABLET | Freq: Two times a day (BID) | ORAL | Status: DC

## 2013-02-08 NOTE — Telephone Encounter (Signed)
Chart is already on the cart, paper request attached. ST

## 2013-02-08 NOTE — Telephone Encounter (Signed)
Need refill on her Atenolol 50 mg #30 and Verapamil ER 240 mg #30

## 2013-02-08 NOTE — Telephone Encounter (Signed)
Rx was sent to pharmacy electronically. 

## 2013-02-10 DIAGNOSIS — Z23 Encounter for immunization: Secondary | ICD-10-CM | POA: Diagnosis not present

## 2013-02-14 DIAGNOSIS — Z79899 Other long term (current) drug therapy: Secondary | ICD-10-CM | POA: Diagnosis not present

## 2013-02-14 DIAGNOSIS — G894 Chronic pain syndrome: Secondary | ICD-10-CM | POA: Diagnosis not present

## 2013-02-14 DIAGNOSIS — M47817 Spondylosis without myelopathy or radiculopathy, lumbosacral region: Secondary | ICD-10-CM | POA: Diagnosis not present

## 2013-02-14 DIAGNOSIS — M159 Polyosteoarthritis, unspecified: Secondary | ICD-10-CM | POA: Diagnosis not present

## 2013-02-15 ENCOUNTER — Ambulatory Visit: Payer: Medicare Other | Admitting: Cardiovascular Disease

## 2013-02-15 DIAGNOSIS — I872 Venous insufficiency (chronic) (peripheral): Secondary | ICD-10-CM | POA: Diagnosis not present

## 2013-02-15 DIAGNOSIS — T148XXA Other injury of unspecified body region, initial encounter: Secondary | ICD-10-CM | POA: Diagnosis not present

## 2013-02-15 DIAGNOSIS — L97209 Non-pressure chronic ulcer of unspecified calf with unspecified severity: Secondary | ICD-10-CM | POA: Diagnosis not present

## 2013-02-15 DIAGNOSIS — IMO0001 Reserved for inherently not codable concepts without codable children: Secondary | ICD-10-CM | POA: Diagnosis not present

## 2013-02-23 ENCOUNTER — Encounter: Payer: Self-pay | Admitting: Cardiovascular Disease

## 2013-02-23 ENCOUNTER — Ambulatory Visit (INDEPENDENT_AMBULATORY_CARE_PROVIDER_SITE_OTHER): Payer: Medicare Other | Admitting: Cardiovascular Disease

## 2013-02-23 VITALS — BP 116/66 | HR 67 | Ht 68.0 in | Wt 116.4 lb

## 2013-02-23 DIAGNOSIS — E785 Hyperlipidemia, unspecified: Secondary | ICD-10-CM | POA: Diagnosis not present

## 2013-02-23 DIAGNOSIS — Z87891 Personal history of nicotine dependence: Secondary | ICD-10-CM

## 2013-02-23 DIAGNOSIS — I059 Rheumatic mitral valve disease, unspecified: Secondary | ICD-10-CM | POA: Diagnosis not present

## 2013-02-23 DIAGNOSIS — Z79899 Other long term (current) drug therapy: Secondary | ICD-10-CM | POA: Diagnosis not present

## 2013-02-23 DIAGNOSIS — I498 Other specified cardiac arrhythmias: Secondary | ICD-10-CM

## 2013-02-23 DIAGNOSIS — I471 Supraventricular tachycardia, unspecified: Secondary | ICD-10-CM

## 2013-02-23 DIAGNOSIS — J4489 Other specified chronic obstructive pulmonary disease: Secondary | ICD-10-CM

## 2013-02-23 DIAGNOSIS — J449 Chronic obstructive pulmonary disease, unspecified: Secondary | ICD-10-CM

## 2013-02-23 LAB — COMPREHENSIVE METABOLIC PANEL
BUN: 20 mg/dL (ref 6–23)
CO2: 30 mEq/L (ref 19–32)
Calcium: 10.2 mg/dL (ref 8.4–10.5)
Chloride: 105 mEq/L (ref 96–112)
Creat: 0.71 mg/dL (ref 0.50–1.10)
Glucose, Bld: 83 mg/dL (ref 70–99)

## 2013-02-23 LAB — CBC
HCT: 45.2 % (ref 36.0–46.0)
Hemoglobin: 15.3 g/dL — ABNORMAL HIGH (ref 12.0–15.0)
WBC: 5.7 10*3/uL (ref 4.0–10.5)

## 2013-02-23 LAB — LIPID PANEL
HDL: 46 mg/dL (ref >39)
LDL Cholesterol: 75 mg/dL (ref 0–99)
Triglycerides: 138 mg/dL (ref <150)
VLDL: 28 mg/dL (ref 0–40)

## 2013-02-23 MED ORDER — ATENOLOL 25 MG PO TABS: 25.0000 mg | ORAL_TABLET | Freq: Two times a day (BID) | ORAL | Status: DC

## 2013-02-23 MED ORDER — VERAPAMIL HCL 240 MG (CO) PO TB24: 240.0000 mg | ORAL_TABLET | Freq: Every day | ORAL | Status: DC

## 2013-02-23 MED ORDER — ATORVASTATIN CALCIUM 10 MG PO TABS: 10.0000 mg | ORAL_TABLET | Freq: Every day | ORAL | Status: DC

## 2013-02-23 NOTE — Patient Instructions (Signed)
Your physician recommends that you return for lab work fasting.  Your physician recommends that you schedule a follow-up appointment in: 1 YEAR 

## 2013-02-23 NOTE — Progress Notes (Signed)
Patient ID: [name], female   DOB: [birth year], 62 y.o.   MRN: [id number]     HPI: [name] is a 62 y.o. female resents to the office today for 18 month followup cardiology evaluation. I last saw her in March 2013.  [name] has a history of superventricular tachycardia which has been fairly well-controlled on combination verapamil plus beta blocker therapy. There is a history of underlying mitral valve prolapse, mild carotid disease, hyperlipidemia, as well as mild COPD. She had smoked for over 30 years but quit smoking approximately 4 years ago.  She tells me last September she was hospitalized after being bitten by a copperhead snake and required 5 days in the hospital. Apparently her cardiac medicines were withheld at that time and during the hospitalization she had recurrent episodes of tachycardia dysrhythmia. As long as she takes her medicines, and she feels that her heart rhythm is controlled. There is rare intermittent palpitations.  She has not had laboratory checked over the year. She denies recent episodes of chest pain. She denies shortness of breath. She denies presyncope or syncope.  Past Medical History  Diagnosis Date  . Anxiety   . Arthritis   . Depression   . PSVT (paroxysmal supraventricular tachycardia)   . Heart murmur   . Back pain, chronic     Past Surgical History  Procedure Laterality Date  . Tubal ligation    . Cervical disc surgery    . Colectomy    . Abdominal hysterectomy    . Knee surgery    . Tonsillectomy    . Spine surgery  1983-01/2007  . Back surgery      14 back surgery  . Breast surgery  1976    breast reduction    Allergies  Allergen Reactions  . Bupropion Hcl Swelling  . Morphine Itching    Current Outpatient Prescriptions  Medication Sig Dispense Refill  . aspirin 325 MG tablet Take 325 mg by mouth every other day.       Marland Kitchen atenolol (TENORMIN) 50 MG tablet Take 0.5 tablets (25 mg total) by mouth 2 (two) times daily.   30 tablet  0  . atorvastatin (LIPITOR) 10 MG tablet Take 1 tablet (10 mg total) by mouth daily.  30 tablet  6  . baclofen (LIORESAL) 20 MG tablet Take 20 mg by mouth 3 (three) times daily.       . Calcium Carbonate-Vitamin D (CALCIUM + D PO) Take by mouth.      . chlorhexidine (PERIDEX) 0.12 % solution Use as directed 15 mLs in the mouth or throat 2 (two) times daily.       . clonazePAM (KLONOPIN) 1 MG tablet Take 1 mg by mouth at bedtime.      . fish oil-omega-3 fatty acids 1000 MG capsule Take 2 g by mouth daily.      Marland Kitchen HYDROcodone-acetaminophen (NORCO) 10-325 MG per tablet Take 1 tablet by mouth every 4 (four) hours as needed. For pain      . sertraline (ZOLOFT) 100 MG tablet Take 100 mg by mouth daily.      . verapamil (COVERA HS) 240 MG (CO) 24 hr tablet Take 1 tablet (240 mg total) by mouth at bedtime.  30 tablet  0   No current facility-administered medications for this visit.    History   Social History  . Marital Status: Married    Spouse Name: N/A    Number of Children: N/A  . Years  of Education: N/A   Occupational History  . Not on file.   Social History Main Topics  . Smoking status: Former Smoker    Quit date: 05/06/2008  . Smokeless tobacco: Former Neurosurgeon  . Alcohol Use: 0.0 oz/week    5-6 Glasses of wine per week     Comment: weekly  . Drug Use: No  . Sexual Activity: Yes   Other Topics Concern  . Not on file   Social History Narrative  . No narrative on file   Social history is notable in that she is married. She has one stepchild and 2 stepgrandchildren. She does walk. She has no recent tobacco use or alcohol use  Family History  Problem Relation Age of Onset  . COPD Father   . Diabetes Mother   . Heart disease Mother   . Kidney disease Mother   . Hypertension Mother     ROS is negative for fevers, chills or night sweats. She denies visual changes. She denies recent cough. She denies sputum production. She does have history of tachycardia  palpitations but this is well-controlled on her current medical therapy. She denies chest pressure. There is no presyncope or syncope. She denies change in bowel or bladder habits. She denies nausea vomiting diarrhea. She denies GU symptoms or GI symptoms. She denies skin lesions or rash. She denies diabetes. She denies endocrine issues. She does have mild COPD. Her mobility was a history of depression. She denies claudication.  Other comprehensive 12 point system review is negative.  PE BP 116/66  Pulse 67  Ht 5\' 8"  (1.727 m)  Wt 116 lb 6.4 oz (52.799 kg)  BMI 17.7 kg/m2  [name]: Alert, oriented, no distress.  Skin: normal turgor, no rashes HEENT: Normocephalic, atraumatic. Pupils round and reactive; sclera anicteric;no lid lag.  Nose without nasal septal hypertrophy Mouth/Parynx benign; Mallinpatti scale 2 Neck: No JVD, no carotid briuts Lungs: clear to ausculatation and percussion; no wheezing or rales Heart: RRR, s1 s2 normal 1/6 systolic murmur with an intermittent systolic click" with her mitral valve prolapse. Abdomen: soft, nontender; no hepatosplenomehaly, BS+; abdominal aorta nontender and not dilated by palpation. Pulses 2+ Extremities: no clubbing cyanosis or edema, Homan's sign negative  Neurologic: grossly nonfocal Psychologic: normal affect and mood.  ECG: Sinus rhythm with incomplete right bundle branch block. Anteroseptal Q waves unchanged.  LABS:  BMET    Component Value Date/Time   NA 141 01/04/2012 1537   K 4.3 01/04/2012 1537   CL 105 01/04/2012 1537   CO2 29 01/24/2011 0547   GLUCOSE 95 01/04/2012 1537   BUN 22 01/04/2012 1537   CREATININE 1.10 01/04/2012 1537   CALCIUM 9.4 01/24/2011 0547   GFRNONAA >60 01/24/2011 0547   GFRAA >60 01/24/2011 0547     Hepatic Function Panel     Component Value Date/Time   PROT 5.9* 01/23/2011 0940   ALBUMIN 2.5* 01/23/2011 0940   AST 24 01/23/2011 0940   ALT 31 01/23/2011 0940   ALKPHOS 84 01/23/2011 0940   BILITOT 0.2* 01/23/2011  0940   BILIDIR <0.1 01/21/2011 1749   IBILI NOT CALCULATED 01/21/2011 1749     CBC    Component Value Date/Time   WBC 7.4 01/05/2012 0853   RBC 4.27 01/05/2012 0853   HGB 14.1 01/05/2012 0853   HCT 42.5 01/05/2012 0853   PLT 177 01/05/2012 0853   MCV 99.5 01/05/2012 0853   MCH 33.0 01/05/2012 0853   MCHC 33.2 01/05/2012 0853   RDW 12.8  01/05/2012 0853   LYMPHSABS 1.8 01/04/2012 1510   MONOABS 0.5 01/04/2012 1510   EOSABS 0.1 01/04/2012 1510   BASOSABS 0.0 01/04/2012 1510     BNP No results found for this basename: probnp    Lipid Panel  No results found for this basename: chol, trig, hdl, cholhdl, vldl, ldlcalc     RADIOLOGY: No results found.    ASSESSMENT AND PLAN:  Ms. Yearick has a history of supraventricular tachycardia which has been fairly well-controlled on verapamil 240 mg as well as atenolol 25 twice a day. In the past, when she just try to reduce her dose or skips a dose intermittently she has noticed recurrent palpitations a period echo Doppler study in 2011 showed normal systolic function. She had borderline mitral valve prolapse with trace MR. A nuclear study showed normal perfusion. I am recommending followup laboratory obtained in the fasting state. We did change her atenolol from the 50 mg pill to the 25 mg pills she's been having difficulty cutting this in half. I did encourage increased exercise and activity. I will review her laboratory and contact her if adjustments need to be made. See her in one year for followup evaluation.    [name], MD, [employer]  02/23/2013 12:20 PM

## 2013-03-28 ENCOUNTER — Encounter: Payer: Self-pay | Admitting: *Deleted

## 2013-03-30 DIAGNOSIS — M25569 Pain in unspecified knee: Secondary | ICD-10-CM | POA: Diagnosis not present

## 2013-04-03 DIAGNOSIS — M1710 Unilateral primary osteoarthritis, unspecified knee: Secondary | ICD-10-CM | POA: Diagnosis not present

## 2013-04-04 ENCOUNTER — Other Ambulatory Visit: Payer: Self-pay | Admitting: *Deleted

## 2013-04-04 MED ORDER — VERAPAMIL HCL 240 MG (CO) PO TB24: 240.0000 mg | ORAL_TABLET | Freq: Every day | ORAL | Status: DC

## 2013-04-04 NOTE — Telephone Encounter (Signed)
Verapamil 240mg  refilled w/5 refills electronically to [facility] pharmacy.

## 2013-04-11 DIAGNOSIS — M1710 Unilateral primary osteoarthritis, unspecified knee: Secondary | ICD-10-CM | POA: Diagnosis not present

## 2013-04-14 DIAGNOSIS — Z79899 Other long term (current) drug therapy: Secondary | ICD-10-CM | POA: Diagnosis not present

## 2013-04-14 DIAGNOSIS — M961 Postlaminectomy syndrome, not elsewhere classified: Secondary | ICD-10-CM | POA: Diagnosis not present

## 2013-04-14 DIAGNOSIS — M47814 Spondylosis without myelopathy or radiculopathy, thoracic region: Secondary | ICD-10-CM | POA: Diagnosis not present

## 2013-04-14 DIAGNOSIS — M546 Pain in thoracic spine: Secondary | ICD-10-CM | POA: Diagnosis not present

## 2013-04-27 ENCOUNTER — Encounter (HOSPITAL_COMMUNITY): Payer: Self-pay | Admitting: Emergency Medicine

## 2013-04-27 ENCOUNTER — Emergency Department (HOSPITAL_COMMUNITY): Payer: Medicare Other

## 2013-04-27 ENCOUNTER — Emergency Department (HOSPITAL_COMMUNITY)
Admission: EM | Admit: 2013-04-27 | Discharge: 2013-04-27 | Disposition: A | Discharge status: Home / Self Care | Payer: Medicare Other | Attending: Emergency Medicine | Admitting: Emergency Medicine

## 2013-04-27 DIAGNOSIS — S8990XA Unspecified injury of unspecified lower leg, initial encounter: Secondary | ICD-10-CM | POA: Diagnosis not present

## 2013-04-27 DIAGNOSIS — Z9889 Other specified postprocedural states: Secondary | ICD-10-CM | POA: Insufficient documentation

## 2013-04-27 DIAGNOSIS — Z79899 Other long term (current) drug therapy: Secondary | ICD-10-CM | POA: Diagnosis not present

## 2013-04-27 DIAGNOSIS — W010XXA Fall on same level from slipping, tripping and stumbling without subsequent striking against object, initial encounter: Secondary | ICD-10-CM | POA: Insufficient documentation

## 2013-04-27 DIAGNOSIS — Z7982 Long term (current) use of aspirin: Secondary | ICD-10-CM | POA: Diagnosis not present

## 2013-04-27 DIAGNOSIS — M129 Arthropathy, unspecified: Secondary | ICD-10-CM | POA: Insufficient documentation

## 2013-04-27 DIAGNOSIS — Y9389 Activity, other specified: Secondary | ICD-10-CM | POA: Diagnosis not present

## 2013-04-27 DIAGNOSIS — F411 Generalized anxiety disorder: Secondary | ICD-10-CM | POA: Insufficient documentation

## 2013-04-27 DIAGNOSIS — M25559 Pain in unspecified hip: Secondary | ICD-10-CM | POA: Diagnosis not present

## 2013-04-27 DIAGNOSIS — R011 Cardiac murmur, unspecified: Secondary | ICD-10-CM | POA: Diagnosis not present

## 2013-04-27 DIAGNOSIS — G8929 Other chronic pain: Secondary | ICD-10-CM | POA: Insufficient documentation

## 2013-04-27 DIAGNOSIS — S79919A Unspecified injury of unspecified hip, initial encounter: Secondary | ICD-10-CM | POA: Diagnosis not present

## 2013-04-27 DIAGNOSIS — Y929 Unspecified place or not applicable: Secondary | ICD-10-CM | POA: Insufficient documentation

## 2013-04-27 DIAGNOSIS — M25551 Pain in right hip: Secondary | ICD-10-CM

## 2013-04-27 DIAGNOSIS — F3289 Other specified depressive episodes: Secondary | ICD-10-CM | POA: Diagnosis not present

## 2013-04-27 DIAGNOSIS — S99929A Unspecified injury of unspecified foot, initial encounter: Secondary | ICD-10-CM | POA: Diagnosis not present

## 2013-04-27 DIAGNOSIS — W19XXXA Unspecified fall, initial encounter: Secondary | ICD-10-CM

## 2013-04-27 DIAGNOSIS — Z87891 Personal history of nicotine dependence: Secondary | ICD-10-CM | POA: Insufficient documentation

## 2013-04-27 DIAGNOSIS — M25569 Pain in unspecified knee: Secondary | ICD-10-CM | POA: Diagnosis not present

## 2013-04-27 DIAGNOSIS — S79929A Unspecified injury of unspecified thigh, initial encounter: Principal | ICD-10-CM

## 2013-04-27 DIAGNOSIS — F329 Major depressive disorder, single episode, unspecified: Secondary | ICD-10-CM | POA: Diagnosis not present

## 2013-04-27 LAB — BASIC METABOLIC PANEL
BUN: 18 mg/dL (ref 6–23)
CHLORIDE: 106 meq/L (ref 96–112)
CO2: 26 meq/L (ref 19–32)
Calcium: 9.7 mg/dL (ref 8.4–10.5)
Creatinine, Ser: 0.63 mg/dL (ref 0.50–1.10)
GFR calc non Af Amer: >90 mL/min (ref >90)
Glucose, Bld: 79 mg/dL (ref 70–99)
POTASSIUM: 4 meq/L (ref 3.7–5.3)
Sodium: 145 mEq/L (ref 137–147)

## 2013-04-27 LAB — CBC
HEMATOCRIT: 43.4 % (ref 36.0–46.0)
Hemoglobin: 14.9 g/dL (ref 12.0–15.0)
MCH: 34.4 pg — ABNORMAL HIGH (ref 26.0–34.0)
MCHC: 34.3 g/dL (ref 30.0–36.0)
MCV: 100.2 fL — AB (ref 78.0–100.0)
Platelets: 181 10*3/uL (ref 150–400)
RBC: 4.33 MIL/uL (ref 3.87–5.11)
RDW: 13.7 % (ref 11.5–15.5)
WBC: 6.7 10*3/uL (ref 4.0–10.5)

## 2013-04-27 MED ORDER — HYDROMORPHONE HCL PF 1 MG/ML IJ SOLN
0.5000 mg | Freq: Once | INTRAMUSCULAR | Status: AC
  Administered 2013-04-27: 0.5 mg via INTRAVENOUS
  Filled 2013-04-27: qty 1

## 2013-04-27 MED ORDER — HYDROMORPHONE HCL PF 1 MG/ML IJ SOLN
1.0000 mg | Freq: Once | INTRAMUSCULAR | Status: AC
  Administered 2013-04-27: 1 mg via INTRAVENOUS
  Filled 2013-04-27: qty 1

## 2013-04-27 MED ORDER — DIPHENHYDRAMINE HCL 50 MG/ML IJ SOLN
25.0000 mg | Freq: Once | INTRAMUSCULAR | Status: AC
  Administered 2013-04-27: 25 mg via INTRAVENOUS
  Filled 2013-04-27: qty 1

## 2013-04-27 NOTE — Progress Notes (Signed)
Orthopedic Tech Progress Note Patient Details:  [name] February 28, 1951 [id number]  Ortho Devices Type of Ortho Device: Crutches Ortho Device/Splint Interventions: Application   [name] 04/27/2013, 6:18 PM

## 2013-04-27 NOTE — ED Provider Notes (Signed)
CSN: [id number]     Arrival date & time 04/27/13  1122 History   First MD Initiated Contact with Patient 04/27/13 1153     Chief Complaint  Patient presents with  . Fall  . Hip Pain    Right    (Consider location/radiation/quality/duration/timing/severity/associated sxs/prior Treatment) HPI Comments: [name] is a 63 year-old female with a past medical history of multiple back surgeries, neck surgery, SVT, presenting the Emergency Department with a chief complaint of mechanical fall.  She reports waking up at 0200 this morning to use the restroom when she got her foot caught under a rug and fell.  She reports landing on her right hip and right knee.  She denies hitting her head or LOC.  She reports she was unable to brace the fall with her upper extremities.  She reports she was able to ambulate after the injury.  She reports worsening pain with ambulation and movement of the extremity. She reports taking home Norco 10-325 without relief this morning.   Patient is a 63 y.o. female presenting with fall and hip pain. The history is provided by the patient and medical records. No language interpreter was used.  Fall Pertinent negatives include no abdominal pain, chills, coughing, fever, headaches, nausea, vomiting or weakness.  Hip Pain Pertinent negatives include no abdominal pain, chills, coughing, fever, headaches, nausea, vomiting or weakness.    Past Medical History  Diagnosis Date  . Anxiety   . Arthritis   . Depression   . PSVT (paroxysmal supraventricular tachycardia)   . Heart murmur   . Back pain, chronic    Past Surgical History  Procedure Laterality Date  . Tubal ligation    . Cervical disc surgery    . Colectomy    . Knee surgery    . Tonsillectomy    . Spine surgery  1983-01/2007  . Back surgery      14 back surgery  . Breast surgery  1976    breast reduction   Family History  Problem Relation Age of Onset  . COPD Father   . Diabetes Mother   . Heart  disease Mother   . Kidney disease Mother   . Hypertension Mother    History  Substance Use Topics  . Smoking status: Former Smoker    Quit date: 05/06/2008  . Smokeless tobacco: Former Neurosurgeon  . Alcohol Use: 0.0 oz/week    5-6 Glasses of wine per week     Comment: occasional   OB History   Grav Para Term Preterm Abortions TAB SAB Ect Mult Living                 Review of Systems  Constitutional: Negative for fever and chills.  HENT: Negative for rhinorrhea.   Respiratory: Negative for cough.   Gastrointestinal: Negative for nausea, vomiting, abdominal pain and diarrhea.  Genitourinary: Negative for dysuria and hematuria.  Neurological: Negative for dizziness, syncope, weakness, light-headedness and headaches.    Allergies  Bupropion hcl and Morphine  Home Medications   Current Outpatient Rx  Name  Route  Sig  Dispense  Refill  . aspirin 325 MG tablet   Oral   Take 325 mg by mouth every evening.          Marland Kitchen atenolol (TENORMIN) 25 MG tablet   Oral   Take 1 tablet (25 mg total) by mouth 2 (two) times daily.   60 tablet   11   . atorvastatin (LIPITOR) 10 MG tablet  Oral   Take 1 tablet (10 mg total) by mouth daily.   30 tablet   6   . baclofen (LIORESAL) 20 MG tablet   Oral   Take 20 mg by mouth 3 (three) times daily.          . Calcium Carbonate-Vitamin D (CALCIUM + D PO)   Oral   Take 1 tablet by mouth every evening.          . chlorhexidine (PERIDEX) 0.12 % solution   Mouth/Throat   Use as directed 15 mLs in the mouth or throat 2 (two) times daily.          . clonazePAM (KLONOPIN) 1 MG tablet   Oral   Take 1 mg by mouth at bedtime.         . fish oil-omega-3 fatty acids 1000 MG capsule   Oral   Take 1 g by mouth 3 (three) times daily.          Marland Kitchen HYDROcodone-acetaminophen (NORCO) 10-325 MG per tablet   Oral   Take 1 tablet by mouth every 4 (four) hours as needed. For pain         . Multiple Vitamin (MULTIVITAMIN WITH MINERALS) TABS  tablet   Oral   Take 1 tablet by mouth daily.         . Pseudoephedrine-Acetaminophen (ALKA-SELTZER PLUS COLD/SINUS PO)   Oral   Take 2 capsules by mouth every 6 (six) hours as needed (Cold symptoms).         . sertraline (ZOLOFT) 100 MG tablet   Oral   Take 100 mg by mouth daily.         . verapamil (COVERA HS) 240 MG (CO) 24 hr tablet   Oral   Take 1 tablet (240 mg total) by mouth at bedtime.   30 tablet   5   . Wheat Dextrin (BENEFIBER PO)   Oral   Take 10 mLs by mouth 2 (two) times daily.          BP 102/80  Pulse 70  Temp(Src) 98.4 F (36.9 C) (Oral)  Resp 16  Ht 5\' 8"  (1.727 m)  Wt 116 lb (52.617 kg)  BMI 17.64 kg/m2  SpO2 94% Physical Exam  Nursing note and vitals reviewed. Constitutional: She is oriented to person, place, and time. She appears well-developed and well-nourished. No distress.  Appears uncomfortable  HENT:  Head: Normocephalic and atraumatic.  Neck: Neck supple.  Cardiovascular: Normal rate and regular rhythm.   Pulmonary/Chest: Effort normal and breath sounds normal. She has no wheezes.  Abdominal: Soft. There is no tenderness. There is no guarding.  Musculoskeletal:       Legs: Decreased ROM secondary to pain. No obvious deformity. No ecchymosis. NV intact.   Neurological: She is alert and oriented to person, place, and time.  Skin: Skin is warm and dry.  Psychiatric: She has a normal mood and affect. Her behavior is normal.    ED Course  Procedures (including critical care time) Labs Review Labs Reviewed  CBC - Abnormal; Notable for the following:    MCV 100.2 (*)    MCH 34.4 (*)    All other components within normal limits  BASIC METABOLIC PANEL   Imaging Review Dg Hip Complete Right  04/27/2013   CLINICAL DATA:  Fall with hip and knee pain.  EXAM: RIGHT HIP - COMPLETE 2+ VIEW; RIGHT KNEE - COMPLETE 4+ VIEW  COMPARISON:  09/24/2006 and 11/28/2012  FINDINGS: Exam demonstrates  mild to moderate symmetric osteoarthritic change  of the hips. There is no acute fracture or dislocation involving the hips. Fusion hardware is present over the lumbar spine with evidence of prior laminectomy over the lower lumbar spine. There is minimal degenerative change of the sacroiliac joints.  The right knee demonstrates minimal chondrocalcinosis. There are very minimal degenerative changes present. There is no acute fracture, dislocation or joint effusion.  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 No acute fracture or dislocation.  Minimal degenerative change of the right knee.  No acute fracture.   Electronically Signed   By: Marin Olp M.D.   On: 04/27/2013 13:00   Ct Pelvis Wo Contrast  04/27/2013   CLINICAL DATA:  [place].  Right hip pain.  EXAM: CT PELVIS WITHOUT CONTRAST  TECHNIQUE: Multidetector CT imaging of the pelvis was performed following the standard protocol without intravenous contrast.  COMPARISON:  Radiographs 04/27/2013.  FINDINGS: Both hips are normally located. There are advanced hip joint degenerative changes bilaterally with joint space narrowing, osteophytic spurring, subchondral cystic change and chondrocalcinosis. No findings for avascular necrosis. No acute hip fracture is identified.  The pubic symphysis and SI joints are intact. Mild degenerative changes. No definite pelvic fractures. There are surgical changes involving the lower lumbar spine and surgical changes from bone graft harvesting involving the right iliac bone.  No significant intrapelvic abnormalities are identified. Moderate bladder distention is noted. No inguinal mass or adenopathy.  IMPRESSION: Advanced hip joint degenerative changes bilaterally but no acute hip fracture.  Intact bony pelvis.   Electronically Signed   By: Kalman Jewels M.D.   On: 04/27/2013 16:32   Dg Knee Complete 4 Views Right  04/27/2013   CLINICAL DATA:  Fall with hip and knee pain.  EXAM: RIGHT HIP - COMPLETE 2+ VIEW; RIGHT KNEE - COMPLETE 4+ VIEW   COMPARISON:  09/24/2006 and 11/28/2012  FINDINGS: Exam demonstrates mild to moderate symmetric osteoarthritic change of the hips. There is no acute fracture or dislocation involving the hips. Fusion hardware is present over the lumbar spine with evidence of prior laminectomy over the lower lumbar spine. There is minimal degenerative change of the sacroiliac joints.  The right knee demonstrates minimal chondrocalcinosis. There are very minimal degenerative changes present. There is no acute fracture, dislocation or joint effusion.  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 No acute fracture or dislocation.  Minimal degenerative change of the right knee.  No acute fracture.   Electronically Signed   By: Marin Olp M.D.   On: 04/27/2013 13:00    EKG Interpretation   None       MDM   1. Hip pain, right   2. Fall, initial encounter    Pt with a history of mechanical fall earlier today.  On exam the patient has decrease ROM due to pain, no obvious deformities. XR ordered.  XR without fracture or dislocation. Re-eval discussed XR findings with patient and patient's husband.  On exam the patient will not extend the leg due to pain.  Discussed patient history, condition, and labs with Dr. Canary Brim who agrees on CT to verify no fracture. CT-without acute findings. Discussed lab results, imaging results, and treatment plan with the patient and husband. Return precautions given. Reports understanding and no other concerns at this time, reports she has pain medication at home and does not need a refill at this time.  Patient is stable for discharge at this time.  Meds given  in ED:  Medications  diphenhydrAMINE (BENADRYL) injection 25 mg (25 mg Intravenous Given 04/27/13 1310)  HYDROmorphone (DILAUDID) injection 1 mg (1 mg Intravenous Given 04/27/13 1309)  HYDROmorphone (DILAUDID) injection 1 mg (1 mg Intravenous Given 04/27/13 1530)  HYDROmorphone (DILAUDID) injection 0.5 mg (0.5  mg Intravenous Given 04/27/13 1655)    Discharge Medication List as of 04/27/2013  4:49 PM          Ander Purpura Burnetta Sabin, PA-C 04/28/13 1510

## 2013-04-27 NOTE — ED Notes (Signed)
Pt reports got up to go to bathroom this morning and tripped over a throw rug landing on right side. Pt c/o right hip and knee pain.

## 2013-04-27 NOTE — Discharge Instructions (Signed)
Call for a follow up appointment with a Family or Primary Care Provider.  Call for a follow up appointment with Dr. Percell Miller, Orthopedics. Return if Symptoms worsen.   Take medication as prescribed.

## 2013-04-28 DIAGNOSIS — M1610 Unilateral primary osteoarthritis, unspecified hip: Secondary | ICD-10-CM | POA: Diagnosis not present

## 2013-04-28 DIAGNOSIS — M25559 Pain in unspecified hip: Secondary | ICD-10-CM | POA: Diagnosis not present

## 2013-04-29 NOTE — ED Provider Notes (Signed)
Medical screening examination/treatment/procedure(s) were performed by non-physician practitioner and as supervising physician I was immediately available for consultation/collaboration.  EKG Interpretation   None        Threasa Beards, MD 04/29/13 [phone]

## 2013-05-03 DIAGNOSIS — M899 Disorder of bone, unspecified: Secondary | ICD-10-CM | POA: Diagnosis not present

## 2013-05-03 DIAGNOSIS — E559 Vitamin D deficiency, unspecified: Secondary | ICD-10-CM | POA: Diagnosis not present

## 2013-05-03 DIAGNOSIS — M949 Disorder of cartilage, unspecified: Secondary | ICD-10-CM | POA: Diagnosis not present

## 2013-05-03 DIAGNOSIS — M25559 Pain in unspecified hip: Secondary | ICD-10-CM | POA: Diagnosis not present

## 2013-05-05 DIAGNOSIS — G4700 Insomnia, unspecified: Secondary | ICD-10-CM | POA: Diagnosis not present

## 2013-05-05 DIAGNOSIS — R7309 Other abnormal glucose: Secondary | ICD-10-CM | POA: Diagnosis not present

## 2013-05-05 DIAGNOSIS — R5381 Other malaise: Secondary | ICD-10-CM | POA: Diagnosis not present

## 2013-05-05 DIAGNOSIS — E785 Hyperlipidemia, unspecified: Secondary | ICD-10-CM | POA: Diagnosis not present

## 2013-05-05 DIAGNOSIS — J209 Acute bronchitis, unspecified: Secondary | ICD-10-CM | POA: Diagnosis not present

## 2013-05-05 DIAGNOSIS — I471 Supraventricular tachycardia: Secondary | ICD-10-CM | POA: Diagnosis not present

## 2013-05-05 DIAGNOSIS — IMO0002 Reserved for concepts with insufficient information to code with codable children: Secondary | ICD-10-CM | POA: Diagnosis not present

## 2013-05-05 DIAGNOSIS — R5383 Other fatigue: Secondary | ICD-10-CM | POA: Diagnosis not present

## 2013-05-09 DIAGNOSIS — Z780 Asymptomatic menopausal state: Secondary | ICD-10-CM | POA: Diagnosis not present

## 2013-06-09 DIAGNOSIS — Z79899 Other long term (current) drug therapy: Secondary | ICD-10-CM | POA: Diagnosis not present

## 2013-06-09 DIAGNOSIS — M961 Postlaminectomy syndrome, not elsewhere classified: Secondary | ICD-10-CM | POA: Diagnosis not present

## 2013-06-09 DIAGNOSIS — G890 Central pain syndrome: Secondary | ICD-10-CM | POA: Diagnosis not present

## 2013-06-09 DIAGNOSIS — M25559 Pain in unspecified hip: Secondary | ICD-10-CM | POA: Diagnosis not present

## 2013-08-04 DIAGNOSIS — M961 Postlaminectomy syndrome, not elsewhere classified: Secondary | ICD-10-CM | POA: Diagnosis not present

## 2013-08-04 DIAGNOSIS — Z79899 Other long term (current) drug therapy: Secondary | ICD-10-CM | POA: Diagnosis not present

## 2013-08-04 DIAGNOSIS — G894 Chronic pain syndrome: Secondary | ICD-10-CM | POA: Diagnosis not present

## 2013-10-19 ENCOUNTER — Other Ambulatory Visit: Payer: Self-pay | Admitting: Cardiovascular Disease

## 2013-10-20 ENCOUNTER — Telehealth: Payer: Self-pay | Admitting: Cardiovascular Disease

## 2013-10-20 MED ORDER — ATORVASTATIN CALCIUM 10 MG PO TABS: 10.0000 mg | ORAL_TABLET | Freq: Every day | ORAL | Status: DC

## 2013-10-20 NOTE — Telephone Encounter (Signed)
Rx refill sent to patient pharmacy   

## 2013-10-20 NOTE — Telephone Encounter (Signed)
Need refill on Atorvastatin 10 mg # 30

## 2013-11-02 DIAGNOSIS — M961 Postlaminectomy syndrome, not elsewhere classified: Secondary | ICD-10-CM | POA: Diagnosis not present

## 2013-11-02 DIAGNOSIS — M47817 Spondylosis without myelopathy or radiculopathy, lumbosacral region: Secondary | ICD-10-CM | POA: Diagnosis not present

## 2013-11-02 DIAGNOSIS — M47812 Spondylosis without myelopathy or radiculopathy, cervical region: Secondary | ICD-10-CM | POA: Diagnosis not present

## 2013-11-02 DIAGNOSIS — M159 Polyosteoarthritis, unspecified: Secondary | ICD-10-CM | POA: Diagnosis not present

## 2013-12-01 ENCOUNTER — Telehealth: Payer: Self-pay | Admitting: Cardiovascular Disease

## 2013-12-01 NOTE — Telephone Encounter (Signed)
Closed encounter °

## 2013-12-07 DIAGNOSIS — M25569 Pain in unspecified knee: Secondary | ICD-10-CM | POA: Diagnosis not present

## 2013-12-07 DIAGNOSIS — M1710 Unilateral primary osteoarthritis, unspecified knee: Secondary | ICD-10-CM | POA: Diagnosis not present

## 2013-12-19 ENCOUNTER — Encounter: Payer: Self-pay | Admitting: Gastroenterology

## 2014-01-01 DIAGNOSIS — S93409A Sprain of unspecified ligament of unspecified ankle, initial encounter: Secondary | ICD-10-CM | POA: Diagnosis not present

## 2014-01-03 DIAGNOSIS — S8263XA Displaced fracture of lateral malleolus of unspecified fibula, initial encounter for closed fracture: Secondary | ICD-10-CM | POA: Diagnosis not present

## 2014-01-09 DIAGNOSIS — F339 Major depressive disorder, recurrent, unspecified: Secondary | ICD-10-CM | POA: Diagnosis not present

## 2014-01-09 DIAGNOSIS — G4700 Insomnia, unspecified: Secondary | ICD-10-CM | POA: Diagnosis not present

## 2014-01-09 DIAGNOSIS — Z23 Encounter for immunization: Secondary | ICD-10-CM | POA: Diagnosis not present

## 2014-01-09 DIAGNOSIS — E785 Hyperlipidemia, unspecified: Secondary | ICD-10-CM | POA: Diagnosis not present

## 2014-01-09 DIAGNOSIS — Z79899 Other long term (current) drug therapy: Secondary | ICD-10-CM | POA: Diagnosis not present

## 2014-01-09 DIAGNOSIS — I471 Supraventricular tachycardia: Secondary | ICD-10-CM | POA: Diagnosis not present

## 2014-01-09 DIAGNOSIS — R7309 Other abnormal glucose: Secondary | ICD-10-CM | POA: Diagnosis not present

## 2014-01-10 DIAGNOSIS — M1710 Unilateral primary osteoarthritis, unspecified knee: Secondary | ICD-10-CM | POA: Diagnosis not present

## 2014-01-12 DIAGNOSIS — R7309 Other abnormal glucose: Secondary | ICD-10-CM | POA: Diagnosis not present

## 2014-01-12 DIAGNOSIS — E785 Hyperlipidemia, unspecified: Secondary | ICD-10-CM | POA: Diagnosis not present

## 2014-01-12 DIAGNOSIS — Z79899 Other long term (current) drug therapy: Secondary | ICD-10-CM | POA: Diagnosis not present

## 2014-01-22 DIAGNOSIS — S0100XA Unspecified open wound of scalp, initial encounter: Secondary | ICD-10-CM | POA: Diagnosis not present

## 2014-01-29 DIAGNOSIS — S82401K Unspecified fracture of shaft of right fibula, subsequent encounter for closed fracture with nonunion: Secondary | ICD-10-CM | POA: Diagnosis not present

## 2014-01-29 DIAGNOSIS — G894 Chronic pain syndrome: Secondary | ICD-10-CM | POA: Diagnosis not present

## 2014-01-29 DIAGNOSIS — M961 Postlaminectomy syndrome, not elsewhere classified: Secondary | ICD-10-CM | POA: Diagnosis not present

## 2014-01-29 DIAGNOSIS — M47816 Spondylosis without myelopathy or radiculopathy, lumbar region: Secondary | ICD-10-CM | POA: Diagnosis not present

## 2014-01-30 DIAGNOSIS — S8263XA Displaced fracture of lateral malleolus of unspecified fibula, initial encounter for closed fracture: Secondary | ICD-10-CM | POA: Diagnosis not present

## 2014-01-31 DIAGNOSIS — M1711 Unilateral primary osteoarthritis, right knee: Secondary | ICD-10-CM | POA: Diagnosis not present

## 2014-02-14 DIAGNOSIS — M1711 Unilateral primary osteoarthritis, right knee: Secondary | ICD-10-CM | POA: Diagnosis not present

## 2014-02-20 ENCOUNTER — Other Ambulatory Visit: Payer: Self-pay | Admitting: Cardiovascular Disease

## 2014-02-22 DIAGNOSIS — M1711 Unilateral primary osteoarthritis, right knee: Secondary | ICD-10-CM | POA: Diagnosis not present

## 2014-02-27 ENCOUNTER — Ambulatory Visit (INDEPENDENT_AMBULATORY_CARE_PROVIDER_SITE_OTHER): Payer: Medicare Other | Admitting: Cardiovascular Disease

## 2014-02-27 ENCOUNTER — Encounter: Payer: Self-pay | Admitting: Cardiovascular Disease

## 2014-02-27 VITALS — BP 100/58 | HR 64 | Ht 68.0 in | Wt 122.0 lb

## 2014-02-27 DIAGNOSIS — E785 Hyperlipidemia, unspecified: Secondary | ICD-10-CM | POA: Diagnosis not present

## 2014-02-27 DIAGNOSIS — I059 Rheumatic mitral valve disease, unspecified: Secondary | ICD-10-CM | POA: Diagnosis not present

## 2014-02-27 DIAGNOSIS — I471 Supraventricular tachycardia, unspecified: Secondary | ICD-10-CM

## 2014-02-27 HISTORY — DX: Supraventricular tachycardia, unspecified: I47.10

## 2014-02-27 MED ORDER — VERAPAMIL HCL ER 240 MG PO TBCR: EXTENDED_RELEASE_TABLET | ORAL | Status: DC

## 2014-02-27 MED ORDER — ATENOLOL 25 MG PO TABS: 25.0000 mg | ORAL_TABLET | Freq: Two times a day (BID) | ORAL | Status: DC

## 2014-02-27 MED ORDER — ATORVASTATIN CALCIUM 10 MG PO TABS: 10.0000 mg | ORAL_TABLET | Freq: Every day | ORAL | Status: DC

## 2014-02-27 NOTE — Progress Notes (Signed)
Patient ID: [name], female   DOB: 1950/10/20, 63 y.o.   MRN: [id number]     HPI: [name] is a 63 y.o. female resents to the office today for a one year followup cardiology evaluation.   [name] has a history of supraventricular tachycardia which has been fairly well-controlled on combination verapamil SR 240 mg plus beta blocker therapy with atenolol 25 mg twice a day. She has a history of underlying mitral valve prolapse, mild carotid disease, hyperlipidemia, as well as mild COPD. She had smoked for over 30 years but quit smoking approximately 5 to 6 years ago.  Two years ago she was hospitalized after being bitten by a copperhead snake and required 5 days in the hospital. Apparently her cardiac medicines were held at that time and during the hospitalization she had recurrent episodes of tachycardia dysrhythmia. As long as she takes her medicines, and she feels that her heart rhythm is controlled. There is rare intermittent palpitations.typically, these episodes may last for under a minute and typically resolve with the Valsalva maneuver if they don't resolve spontaneously.  She tells me that she recently had complete set of blood work checked 1 month ago by her primary physician, Dr. Bing Quarry.  I will try to obtain these results.  She was told that her labs were excellent including her lipid studies.  She cannot recall the exact numbers.  Last laboratory that I have seen in the computer was from January 2015 at which time her electrolytes were normal.  She denies chest pain.  She continues to be active.  She walks 2-3 miles per day and does water aerobics 3 days per week.  She denies presyncope or syncope.  Past Medical History  Diagnosis Date  . Anxiety   . Arthritis   . Depression   . PSVT (paroxysmal supraventricular tachycardia)   . Heart murmur   . Back pain, chronic     Past Surgical History  Procedure Laterality Date  . Tubal ligation    . Cervical  disc surgery    . Colectomy    . Knee surgery    . Tonsillectomy    . Spine surgery  1983-01/2007  . Back surgery      14 back surgery  . Breast surgery  1976    breast reduction    Allergies  Allergen Reactions  . Bupropion Hcl Swelling  . Morphine Itching    Current Outpatient Prescriptions  Medication Sig Dispense Refill  . aspirin 325 MG tablet Take 325 mg by mouth every evening.     Marland Kitchen atenolol (TENORMIN) 25 MG tablet Take 1 tablet (25 mg total) by mouth 2 (two) times daily. 60 tablet 11  . atorvastatin (LIPITOR) 10 MG tablet Take 1 tablet (10 mg total) by mouth daily. 30 tablet 6  . baclofen (LIORESAL) 20 MG tablet Take 20 mg by mouth 3 (three) times daily.     . Calcium Carbonate-Vitamin D (CALCIUM + D PO) Take 1 tablet by mouth every evening.     . chlorhexidine (PERIDEX) 0.12 % solution Use as directed 15 mLs in the mouth or throat 2 (two) times daily.     . clonazePAM (KLONOPIN) 1 MG tablet Take 1 mg by mouth at bedtime.    . fish oil-omega-3 fatty acids 1000 MG capsule Take 1 g by mouth 3 (three) times daily.     Marland Kitchen HYDROcodone-acetaminophen (NORCO) 10-325 MG per tablet Take 1 tablet by mouth every 4 (four)  hours as needed. For pain    . meloxicam (MOBIC) 7.5 MG tablet Take 1 tablet by mouth as needed.  0  . Multiple Vitamin (MULTIVITAMIN WITH MINERALS) TABS tablet Take 1 tablet by mouth daily.    . Pseudoephedrine-Acetaminophen (ALKA-SELTZER PLUS COLD/SINUS PO) Take 2 capsules by mouth every 6 (six) hours as needed (Cold symptoms).    . sertraline (ZOLOFT) 100 MG tablet Take 100 mg by mouth daily.    . verapamil (CALAN-SR) 240 MG CR tablet TAKE 1 TABLET AT BEDTIME. 30 tablet 1  . Wheat Dextrin (BENEFIBER PO) Take 10 mLs by mouth 2 (two) times daily.     No current facility-administered medications for this visit.    History   Social History  . Marital Status: Married    Spouse Name: N/A    Number of Children: N/A  . Years of Education: N/A   Occupational  History  . Not on file.   Social History Main Topics  . Smoking status: Former Smoker    Quit date: 05/06/2008  . Smokeless tobacco: Former Systems developer  . Alcohol Use: 0.0 oz/week    5-6 Glasses of wine per week     Comment: occasional  . Drug Use: No  . Sexual Activity: Yes   Other Topics Concern  . Not on file   Social History Narrative   Social history is notable in that she is married. She has one stepchild and 2 stepgrandchildren. She does walk. She has no recent tobacco use or alcohol use  Family History  Problem Relation Age of Onset  . COPD Father   . Diabetes Mother   . Heart disease Mother   . Kidney disease Mother   . Hypertension Mother     ROS General: Negative; No fevers, chills, or night sweats;  HEENT: Negative; No changes in vision or hearing, sinus congestion, difficulty swallowing Pulmonary: positive for mild COPD; No cough, wheezing, shortness of breath, hemoptysis Cardiovascular: Negative; No chest pain, presyncope, syncope, palpitations GI: Negative; No nausea, vomiting, diarrhea, or abdominal pain GU: Negative; No dysuria, hematuria, or difficulty voiding Musculoskeletal: Negative; no myalgias, joint pain, or weakness Hematologic/Oncology: Negative; no easy bruising, bleeding Endocrine: Negative; no heat/cold intolerance; no diabetes Neuro: Negative; no changes in balance, headaches Skin: Negative; No rashes or skin lesions Psychiatric: positive for mild depression for which she takes Zoloft.; No behavioral problems,  Sleep: Negative; No snoring, daytime sleepiness, hypersomnolence, bruxism, restless legs, hypnogognic hallucinations, no cataplexy Other comprehensive 14 point system review is negative.   PE BP 100/58 mmHg  Pulse 64  Ht 5\' 8"  (1.727 m)  Wt 122 lb (55.339 kg)  BMI 18.55 kg/m2  General: Alert, oriented, no distress.  Skin: normal turgor, no rashes HEENT: Normocephalic, atraumatic. Pupils round and reactive; sclera anicteric;no lid  lag.  Nose without nasal septal hypertrophy Mouth/Parynx benign; Mallinpatti scale 2 Neck: No JVD, no carotid bruits with normal carotid upstroke Lungs: clear to ausculatation and percussion; no wheezing or rales Chest wall: Nontender to palpation Heart: RRR, s1 s2 normal 1/6 systolic murmur with an intermittent systolic click consistent with her mitral valve prolapse.Marland Kitchen  No S3 gallop.  No diastolic murmur, rubs thrills or heaves. Abdomen: soft, nontender; no hepatosplenomehaly, BS+; abdominal aorta nontender and not dilated by palpation. Back: No CVA tenderness Pulses 2+ Extremities: no clubbing cyanosis or edema, Homan's sign negative  Neurologic: grossly nonfocal Psychologic: normal affect and mood.  ECG (independently read by me.  (: Normal sinus rhythm at 64 bpm.  Borderline  first degree AV block with a PR interval at 204 ms.  Mild RV conduction delay.  Anteroseptal Q waves, unchanged  October 2014ECG: Sinus rhythm with incomplete right bundle branch block. Anteroseptal Q waves unchanged.  LABS:  BMET    Component Value Date/Time   NA 145 04/27/2013 1213   K 4.0 04/27/2013 1213   CL 106 04/27/2013 1213   CO2 26 04/27/2013 1213   GLUCOSE 79 04/27/2013 1213   BUN 18 04/27/2013 1213   CREATININE 0.63 04/27/2013 1213   CREATININE 0.71 02/23/2013 1342   CALCIUM 9.7 04/27/2013 1213   GFRNONAA >90 04/27/2013 1213   GFRAA >90 04/27/2013 1213     Hepatic Function Panel     Component Value Date/Time   PROT 7.1 02/23/2013 1342   ALBUMIN 4.8 02/23/2013 1342   AST 28 02/23/2013 1342   ALT 23 02/23/2013 1342   ALKPHOS 72 02/23/2013 1342   BILITOT 0.7 02/23/2013 1342   BILIDIR <0.1 01/21/2011 1749   IBILI NOT CALCULATED 01/21/2011 1749     CBC    Component Value Date/Time   WBC 6.7 04/27/2013 1213   RBC 4.33 04/27/2013 1213   HGB 14.9 04/27/2013 1213   HCT 43.4 04/27/2013 1213   PLT 181 04/27/2013 1213   MCV 100.2* 04/27/2013 1213   MCH 34.4* 04/27/2013 1213   MCHC  34.3 04/27/2013 1213   RDW 13.7 04/27/2013 1213   LYMPHSABS 1.8 01/04/2012 1510   MONOABS 0.5 01/04/2012 1510   EOSABS 0.1 01/04/2012 1510   BASOSABS 0.0 01/04/2012 1510     BNP No results found for: PROBNP  Lipid Panel     Component Value Date/Time   CHOL 149 02/23/2013 1342     RADIOLOGY: No results found.    ASSESSMENT AND PLAN:  [name] is a 63 year old female who has a history of supraventricular tachycardia which has been fairly well-controlled on verapamil 240 mg as well as atenolol 25 twice a day. In the past, when she just try to reduce her dose or skips a dose intermittently she has noticed recurrent palpitations. Her last echo Doppler study in 2011 showed normal systolic function. She had borderline mitral valve prolapse with trace MR. A nuclear study showed normal perfusion. She does note a rare occurrence of palpitations which oftentimes last less than 1 minute, but at times may require the Valsalva maneuver.  Presently, her blood pressure when taken by the nurse was low at 100/58, and when rechecked by me was 114/70.  For this reason, I will not further increase her verapamil or atenolol or to have suggested that can take an extra one half to one atenolol on an as-needed basis.  We also discussed reduction of caffeine since she has been using caffeine beverages.  I reviewed blood work from January.  I will try to obtain the results from Dr. Bea Graff.  I will see her in one-year for follow up evaluation and prior to that office visit she will undergo a five-year follow-up echo Doppler study.   Troy Sine, MD, [facility]  02/27/2014 12:03 PM

## 2014-02-27 NOTE — Patient Instructions (Signed)
Your physician has requested that you have an echocardiogram in November 2016. Echocardiography is a painless test that uses sound waves to create images of your heart. It provides your doctor with information about the size and shape of your heart and how well your heart's chambers and valves are working. This procedure takes approximately one hour. There are no restrictions for this procedure.  Your physician wants you to follow-up in:  1 year or sooner if needed with Dr. Claiborne Billings. You will receive a reminder letter in the mail two months in advance. If you don't receive a letter, please call our office to schedule the follow-up appointment.

## 2014-03-26 DIAGNOSIS — S82401K Unspecified fracture of shaft of right fibula, subsequent encounter for closed fracture with nonunion: Secondary | ICD-10-CM | POA: Diagnosis not present

## 2014-03-26 DIAGNOSIS — G894 Chronic pain syndrome: Secondary | ICD-10-CM | POA: Diagnosis not present

## 2014-03-26 DIAGNOSIS — M47816 Spondylosis without myelopathy or radiculopathy, lumbar region: Secondary | ICD-10-CM | POA: Diagnosis not present

## 2014-03-26 DIAGNOSIS — M961 Postlaminectomy syndrome, not elsewhere classified: Secondary | ICD-10-CM | POA: Diagnosis not present

## 2014-05-21 DIAGNOSIS — Z79891 Long term (current) use of opiate analgesic: Secondary | ICD-10-CM | POA: Diagnosis not present

## 2014-05-21 DIAGNOSIS — M47816 Spondylosis without myelopathy or radiculopathy, lumbar region: Secondary | ICD-10-CM | POA: Diagnosis not present

## 2014-05-21 DIAGNOSIS — G894 Chronic pain syndrome: Secondary | ICD-10-CM | POA: Diagnosis not present

## 2014-05-21 DIAGNOSIS — M961 Postlaminectomy syndrome, not elsewhere classified: Secondary | ICD-10-CM | POA: Diagnosis not present

## 2014-05-30 DIAGNOSIS — M25551 Pain in right hip: Secondary | ICD-10-CM | POA: Diagnosis not present

## 2014-05-30 DIAGNOSIS — M25561 Pain in right knee: Secondary | ICD-10-CM | POA: Diagnosis not present

## 2014-05-30 DIAGNOSIS — S83241A Other tear of medial meniscus, current injury, right knee, initial encounter: Secondary | ICD-10-CM | POA: Diagnosis not present

## 2014-06-05 DIAGNOSIS — M1711 Unilateral primary osteoarthritis, right knee: Secondary | ICD-10-CM | POA: Diagnosis not present

## 2014-06-11 DIAGNOSIS — M1611 Unilateral primary osteoarthritis, right hip: Secondary | ICD-10-CM | POA: Diagnosis not present

## 2014-06-11 DIAGNOSIS — M1711 Unilateral primary osteoarthritis, right knee: Secondary | ICD-10-CM | POA: Diagnosis not present

## 2014-06-13 DIAGNOSIS — M1611 Unilateral primary osteoarthritis, right hip: Secondary | ICD-10-CM | POA: Diagnosis not present

## 2014-07-05 DIAGNOSIS — M1711 Unilateral primary osteoarthritis, right knee: Secondary | ICD-10-CM | POA: Diagnosis not present

## 2014-07-05 DIAGNOSIS — M1611 Unilateral primary osteoarthritis, right hip: Secondary | ICD-10-CM | POA: Diagnosis not present

## 2014-07-05 NOTE — H&P (Signed)
TOTAL HIP ADMISSION H&P  Patient is admitted for right total hip arthroplasty.  Subjective:  Chief Complaint: right hip pain  HPI: [name], 64 y.o. female, has a history of pain and functional disability in the right hip(s) due to arthritis and patient has failed non-surgical conservative treatments for greater than 12 weeks to include NSAID's and/or analgesics, corticosteriod injections and activity modification.  Onset of symptoms was gradual starting 5 years ago with gradually worsening course since that time.The patient noted no past surgery on the right hip(s).  Patient currently rates pain in the right hip at 7 out of 10 with activity. Patient has night pain, worsening of pain with activity and weight bearing, pain that interfers with activities of daily living and pain with passive range of motion. Patient has evidence of joint space narrowing by imaging studies. This condition presents safety issues increasing the risk of falls.  There is no current active infection.  Patient Active Problem List   Diagnosis Date Noted  . SVT (supraventricular tachycardia) 02/27/2014  . Hyperlipidemia 02/27/2014  . History of tobacco abuse 02/23/2013  . Snake bite 01/04/2012  . Inguinal lymphadenopathy 10/01/2011  . DEPRESSION 11/07/2008  . Mitral valve disorder 11/07/2008  . COPD 11/07/2008   Past Medical History  Diagnosis Date  . Anxiety   . Arthritis   . Depression   . PSVT (paroxysmal supraventricular tachycardia)   . Heart murmur   . Back pain, chronic     Past Surgical History  Procedure Laterality Date  . Tubal ligation    . Cervical disc surgery    . Colectomy    . Knee surgery    . Tonsillectomy    . Spine surgery  1983-01/2007  . Back surgery      14 back surgery  . Breast surgery  1976    breast reduction    No prescriptions prior to admission   Allergies  Allergen Reactions  . Bupropion Hcl Swelling  . Morphine Itching    History  Substance Use Topics  .  Smoking status: Former Smoker    Quit date: 05/06/2008  . Smokeless tobacco: Former Systems developer  . Alcohol Use: 0.0 oz/week    5-6 Glasses of wine per week     Comment: occasional    Family History  Problem Relation Age of Onset  . COPD Father   . Diabetes Mother   . Heart disease Mother   . Kidney disease Mother   . Hypertension Mother      Review of Systems  Constitutional: Negative for fever and chills.  HENT: Negative for ear pain and sore throat.   Eyes: Negative for blurred vision and double vision.  Respiratory: Negative for cough and wheezing.   Cardiovascular: Negative for chest pain and palpitations.  Gastrointestinal: Negative for nausea and vomiting.  Musculoskeletal: Positive for joint pain (right hip).  Skin: Negative for rash.  Neurological: Negative for dizziness and seizures.  Psychiatric/Behavioral: Negative for depression and suicidal ideas.    Objective:  Physical Exam  Constitutional: She is oriented to person, place, and time. She appears well-developed and well-nourished.  HENT:  Head: Normocephalic and atraumatic.  Eyes: Conjunctivae and EOM are normal. Pupils are equal, round, and reactive to light.  Neck: Normal range of motion. Neck supple.  Cardiovascular: Normal rate and intact distal pulses.   Respiratory: Effort normal and breath sounds normal.  GI: Soft. Bowel sounds are normal.  Musculoskeletal:  No tenderness to palpation, decreased ROM of right hip  by approx. 50%  Neurological: She is alert and oriented to person, place, and time.  Skin: Skin is warm and dry.  Psychiatric: She has a normal mood and affect. Her behavior is normal. Judgment and thought content normal.    Vital signs in last 24 hours:    Labs:   Estimated body mass index is 18.55 kg/(m^2) as calculated from the following:   Height as of 02/27/14: 5\' 8"  (1.727 m).   Weight as of 02/27/14: 55.339 kg (122 lb).   Imaging Review Plain radiographs demonstrate severe  degenerative joint disease of the right hip(s). The bone quality appears to be fair for age and reported activity level.  Assessment/Plan:  End stage arthritis, right hip(s)  The patient history, physical examination, clinical judgement of the provider and imaging studies are consistent with end stage degenerative joint disease of the right hip(s) and total hip arthroplasty is deemed medically necessary. The treatment options including medical management, injection therapy, arthroscopy and arthroplasty were discussed at length. The risks and benefits of total hip arthroplasty were presented and reviewed. The risks due to aseptic loosening, infection, stiffness, dislocation/subluxation,  thromboembolic complications and other imponderables were discussed.  The patient acknowledged the explanation, agreed to proceed with the plan and consent was signed. Patient is being admitted for inpatient treatment for surgery, pain control, PT, OT, prophylactic antibiotics, VTE prophylaxis, progressive ambulation and ADL's and discharge planning.The patient is planning to be discharged home with home health services

## 2014-07-06 ENCOUNTER — Telehealth: Payer: Self-pay | Admitting: *Deleted

## 2014-07-06 NOTE — Telephone Encounter (Signed)
Faxed approval to [facility] ( Dr. Fredonia Highland) for total right hip replacement.

## 2014-07-12 DIAGNOSIS — Z79891 Long term (current) use of opiate analgesic: Secondary | ICD-10-CM | POA: Diagnosis not present

## 2014-07-12 DIAGNOSIS — G894 Chronic pain syndrome: Secondary | ICD-10-CM | POA: Diagnosis not present

## 2014-07-12 DIAGNOSIS — M961 Postlaminectomy syndrome, not elsewhere classified: Secondary | ICD-10-CM | POA: Diagnosis not present

## 2014-07-12 DIAGNOSIS — M47816 Spondylosis without myelopathy or radiculopathy, lumbar region: Secondary | ICD-10-CM | POA: Diagnosis not present

## 2014-07-17 NOTE — Pre-Procedure Instructions (Addendum)
[name]  07/17/2014   Your procedure is scheduled on:  Tuesday, April 5.  Report to [facility] Admitting at 5:30 AM.   Call this number if you have problems the morning of surgery: [phone]              For any other questions, please call [phone], Monday - Friday 8 AM - 4 PM.   Remember:   Do not eat food or drink liquids after midnight Monday, March 4.   Take these medicines the morning of surgery with A SIP OF WATER: atenolol (TENORMIN), baclofen (LIORESAL),  sertraline (ZOLOFT).              Take if needed:HYDROcodone-acetaminophen ([place]).                 Stop taking Meloxicam (Mobic), Vitamins, Aspirin,Fish oil 07/26/14   Do not wear jewelry, make-up or nail polish.  Do not wear lotions, powders, or perfumes.   Do not shave 48 hours prior to surgery.   Do not bring valuables to the hospital.             [facility] is not responsible for any belongings or valuables.               Contacts, dentures or bridgework may not be worn into surgery.  Leave suitcase in the car. After surgery it may be brought to your room.  For patients admitted to the hospital, discharge time is determined by your  treatment team.                 Special Instructions: - Special Instructions: [facility] - Preparing for Surgery  Before surgery, you can play an important role.  Because skin is not sterile, your skin needs to be as free of germs as possible.  You can reduce the number of germs on you skin by washing with CHG (chlorahexidine gluconate) soap before surgery.  CHG is an antiseptic cleaner which kills germs and bonds with the skin to continue killing germs even after washing.  Please DO NOT use if you have an allergy to CHG or antibacterial soaps.  If your skin becomes reddened/irritated stop using the CHG and inform your nurse when you arrive at Short Stay.  Do not shave (including legs and underarms) for at least 48 hours prior to the first CHG shower.  You may  shave your face.  Please follow these instructions carefully:   1.  Shower with CHG Soap the night before surgery and the morning of Surgery.  2.  If you choose to wash your hair, wash your hair first as usual with your normal shampoo.  3.  After you shampoo, rinse your hair and body thoroughly to remove the Shampoo.  4.  Use CHG as you would any other liquid soap.  You can apply chg directly  to the skin and wash gently with scrungie or a clean washcloth.  5.  Apply the CHG Soap to your body ONLY FROM THE NECK DOWN.  Do not use on open wounds or open sores.  Avoid contact with your eyes ears, mouth and genitals (private parts).  Wash genitals (private parts)       with your normal soap.  6.  Wash thoroughly, paying special attention to the area where your surgery will be performed.  7.  Thoroughly rinse your body with warm water from the neck down.  8.  DO NOT shower/wash with your normal soap  after using and rinsing off the CHG Soap.  9.  Pat yourself dry with a clean towel.            10.  Wear clean pajamas.            11.  Place clean sheets on your bed the night of your first shower and do not sleep with pets.  Day of Surgery  Do not apply any lotions/deodorants the morning of surgery.  Please wear clean clothes to the hospital/surgery center.   Please read over the following fact sheets that you were given: Pain Booklet, Coughing and Deep Breathing, Blood Transfusion Information and Surgical Site Infection Prevention

## 2014-07-18 ENCOUNTER — Encounter (HOSPITAL_COMMUNITY)
Admission: RE | Admit: 2014-07-18 | Discharge: 2014-07-18 | Disposition: A | Discharge status: Home / Self Care | Payer: Medicare Other | Source: Ambulatory Visit | Attending: Orthopedic Surgery | Admitting: Orthopedic Surgery

## 2014-07-18 ENCOUNTER — Encounter (HOSPITAL_COMMUNITY): Payer: Self-pay

## 2014-07-18 DIAGNOSIS — Z87891 Personal history of nicotine dependence: Secondary | ICD-10-CM | POA: Insufficient documentation

## 2014-07-18 DIAGNOSIS — J449 Chronic obstructive pulmonary disease, unspecified: Secondary | ICD-10-CM | POA: Diagnosis not present

## 2014-07-18 DIAGNOSIS — Z01812 Encounter for preprocedural laboratory examination: Secondary | ICD-10-CM | POA: Insufficient documentation

## 2014-07-18 HISTORY — DX: Other specified postprocedural states: R11.2

## 2014-07-18 HISTORY — DX: Other specified postprocedural states: Z98.890

## 2014-07-18 LAB — BASIC METABOLIC PANEL
ANION GAP: 9 (ref 5–15)
BUN: 16 mg/dL (ref 6–23)
CALCIUM: 10.4 mg/dL (ref 8.4–10.5)
CO2: 29 mmol/L (ref 19–32)
CREATININE: 0.83 mg/dL (ref 0.50–1.10)
Chloride: 105 mmol/L (ref 96–112)
GFR, EST AFRICAN AMERICAN: 86 mL/min — AB (ref >90)
GFR, EST NON AFRICAN AMERICAN: 75 mL/min — AB (ref >90)
Glucose, Bld: 74 mg/dL (ref 70–99)
Potassium: 4 mmol/L (ref 3.5–5.1)
Sodium: 143 mmol/L (ref 135–145)

## 2014-07-18 LAB — CBC
HCT: 47.4 % — ABNORMAL HIGH (ref 36.0–46.0)
Hemoglobin: 15.4 g/dL — ABNORMAL HIGH (ref 12.0–15.0)
MCH: 33.8 pg (ref 26.0–34.0)
MCHC: 32.5 g/dL (ref 30.0–36.0)
MCV: 104.2 fL — ABNORMAL HIGH (ref 78.0–100.0)
PLATELETS: 253 10*3/uL (ref 150–400)
RBC: 4.55 MIL/uL (ref 3.87–5.11)
RDW: 13.1 % (ref 11.5–15.5)
WBC: 6.7 10*3/uL (ref 4.0–10.5)

## 2014-07-18 LAB — URINALYSIS, ROUTINE W REFLEX MICROSCOPIC
Bilirubin Urine: NEGATIVE
Glucose, UA: NEGATIVE mg/dL
HGB URINE DIPSTICK: NEGATIVE
Ketones, ur: NEGATIVE mg/dL
NITRITE: POSITIVE — AB
PROTEIN: NEGATIVE mg/dL
SPECIFIC GRAVITY, URINE: 1.012 (ref 1.005–1.030)
UROBILINOGEN UA: 0.2 mg/dL (ref 0.0–1.0)
pH: 6 (ref 5.0–8.0)

## 2014-07-18 LAB — TYPE AND SCREEN
ABO/RH(D): O POS
Antibody Screen: NEGATIVE

## 2014-07-18 LAB — ABO/RH: ABO/RH(D): O POS

## 2014-07-18 LAB — PROTIME-INR
INR: 0.99 (ref 0.00–1.49)
PROTHROMBIN TIME: 13.2 s (ref 11.6–15.2)

## 2014-07-18 LAB — URINE MICROSCOPIC-ADD ON

## 2014-07-18 LAB — SURGICAL PCR SCREEN
MRSA, PCR: POSITIVE — AB
Staphylococcus aureus: POSITIVE — AB

## 2014-07-18 LAB — APTT: aPTT: 31 seconds (ref 24–37)

## 2014-07-18 NOTE — Progress Notes (Addendum)
req'd copy of clearance for surgery and echo if available from dr Claiborne Billings office

## 2014-07-19 ENCOUNTER — Encounter (HOSPITAL_COMMUNITY): Payer: Self-pay

## 2014-07-19 NOTE — Progress Notes (Signed)
Anesthesia Chart Review:  Patient is a 64 year old female scheduled for right THA, anterior approach on 07/31/14 by Dr. Edmonia Lynch.  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  PCP is Dr. Gilford Rile in [place]. Cardiologist is Dr. Shelva Majestic. Telephone notes from Barry Brunner from Dr. Evette Georges office, indicate surgery "approval" was faxed to Dr. Debroah Loop office.    02/27/14 EKG: NSR, borderline first degree AVB, incomplete right BBB, anteroseptal infarct (age undetermined). EKG already reviewed by Dr. Claiborne Billings.  Anterior Q waves felt unchanged.  06/06/09 Echo: Normal LV systolic function, EF > 02%. Borderline MVP of the AMVL. No significant valve thickening. Trace MR. RVSP is normal.  Preoperative labs noted. Preliminary urine culture is growing > 100,000 GNR.  (Results routed to Dr. Alain Marion and I also left a voice message for Claiborne Billings at his office to follow-up on final culture results for treatment recommendations.)  If no acute changes then I anticipate that she can proceed as planned.  George Hugh [facility] Short Stay Center/Anesthesiology Phone [phone] 07/19/2014 3:42 PM

## 2014-07-20 LAB — URINE CULTURE

## 2014-07-24 DIAGNOSIS — F339 Major depressive disorder, recurrent, unspecified: Secondary | ICD-10-CM | POA: Diagnosis not present

## 2014-07-24 DIAGNOSIS — J449 Chronic obstructive pulmonary disease, unspecified: Secondary | ICD-10-CM | POA: Diagnosis not present

## 2014-07-24 DIAGNOSIS — G4700 Insomnia, unspecified: Secondary | ICD-10-CM | POA: Diagnosis not present

## 2014-07-24 DIAGNOSIS — E785 Hyperlipidemia, unspecified: Secondary | ICD-10-CM | POA: Diagnosis not present

## 2014-07-24 DIAGNOSIS — I6523 Occlusion and stenosis of bilateral carotid arteries: Secondary | ICD-10-CM | POA: Diagnosis not present

## 2014-07-24 DIAGNOSIS — I471 Supraventricular tachycardia: Secondary | ICD-10-CM | POA: Diagnosis not present

## 2014-07-24 DIAGNOSIS — M1990 Unspecified osteoarthritis, unspecified site: Secondary | ICD-10-CM | POA: Diagnosis not present

## 2014-07-24 DIAGNOSIS — R7309 Other abnormal glucose: Secondary | ICD-10-CM | POA: Diagnosis not present

## 2014-07-30 MED ORDER — TRANEXAMIC ACID 100 MG/ML IV SOLN
2000.0000 mg | INTRAVENOUS | Status: AC
  Administered 2014-07-31: 2000 mg via TOPICAL
  Filled 2014-07-30: qty 20

## 2014-07-30 MED ORDER — VANCOMYCIN HCL IN DEXTROSE 1-5 GM/200ML-% IV SOLN
1000.0000 mg | INTRAVENOUS | Status: AC
  Administered 2014-07-31: 1000 mg via INTRAVENOUS
  Filled 2014-07-30: qty 200

## 2014-07-30 MED ORDER — CEFAZOLIN SODIUM-DEXTROSE 2-3 GM-% IV SOLR
2.0000 g | INTRAVENOUS | Status: AC
  Administered 2014-07-31: 2 g via INTRAVENOUS

## 2014-07-30 MED ORDER — POTASSIUM CHLORIDE IN NACL 20-0.45 MEQ/L-% IV SOLN
INTRAVENOUS | Status: DC
  Filled 2014-07-30 (×2): qty 1000

## 2014-07-30 MED ORDER — CHLORHEXIDINE GLUCONATE 4 % EX LIQD
60.0000 mL | Freq: Once | CUTANEOUS | Status: DC
  Filled 2014-07-30: qty 60

## 2014-07-30 MED ORDER — ACETAMINOPHEN 500 MG PO TABS: 1000.0000 mg | ORAL_TABLET | Freq: Once | ORAL | Status: DC

## 2014-07-30 NOTE — Anesthesia Preprocedure Evaluation (Addendum)
Anesthesia Evaluation  Patient identified by MRN, date of birth, ID band Patient awake    Reviewed: Allergy & Precautions, Patient's Chart, lab work & pertinent test results, reviewed documented beta blocker date and time   History of Anesthesia Complications (+) PONV  Airway Mallampati: II   Neck ROM: Limited    Dental  (+) Upper Dentures, Lower Dentures, Edentulous Upper, Edentulous Lower   Pulmonary COPDformer smoker (quit 2010),  breath sounds clear to auscultation        Cardiovascular + dysrhythmias (hx PAT)  ECHO 2011 EF 55%, cardio cl,    Neuro/Psych Anxiety Depression PTSD   GI/Hepatic Neg liver ROS,   Endo/Other  negative endocrine ROS  Renal/GU negative Renal ROS     Musculoskeletal  (+) Arthritis -, SP spine and neck surgery with neck fusion   Abdominal (+)  Abdomen: soft.    Peds  Hematology   Anesthesia Other Findings   Reproductive/Obstetrics                            Anesthesia Physical Anesthesia Plan  ASA: III  Anesthesia Plan: General   Post-op Pain Management:    Induction: Intravenous  Airway Management Planned: Oral ETT and Video Laryngoscope Planned  Additional Equipment:   Intra-op Plan:   Post-operative Plan: Extubation in OR  Informed Consent: I have reviewed the patients History and Physical, chart, labs and discussed the procedure including the risks, benefits and alternatives for the proposed anesthesia with the patient or authorized representative who has indicated his/her understanding and acceptance.     Plan Discussed with:   Anesthesia Plan Comments: (GLIDE, multimodal pain rx)        Anesthesia Quick Evaluation

## 2014-07-31 ENCOUNTER — Inpatient Hospital Stay (HOSPITAL_COMMUNITY)
Admission: RE | Admit: 2014-07-31 | Discharge: 2014-08-02 | DRG: 470 | Disposition: A | Discharge status: Home / Self Care | Payer: Medicare Other | Source: Ambulatory Visit | Attending: Orthopedic Surgery | Admitting: Orthopedic Surgery

## 2014-07-31 ENCOUNTER — Encounter (HOSPITAL_COMMUNITY): Payer: Self-pay | Admitting: Certified Registered Nurse Anesthetist

## 2014-07-31 ENCOUNTER — Inpatient Hospital Stay (HOSPITAL_COMMUNITY): Payer: Medicare Other | Admitting: Certified Registered Nurse Anesthetist

## 2014-07-31 ENCOUNTER — Inpatient Hospital Stay (HOSPITAL_COMMUNITY): Payer: Medicare Other | Admitting: Vascular Surgery

## 2014-07-31 ENCOUNTER — Inpatient Hospital Stay (HOSPITAL_COMMUNITY): Payer: Medicare Other

## 2014-07-31 ENCOUNTER — Encounter (HOSPITAL_COMMUNITY)
Admission: RE | Disposition: A | Discharge status: Home / Self Care | Payer: Self-pay | Source: Ambulatory Visit | Attending: Orthopedic Surgery

## 2014-07-31 DIAGNOSIS — F329 Major depressive disorder, single episode, unspecified: Secondary | ICD-10-CM | POA: Diagnosis present

## 2014-07-31 DIAGNOSIS — F419 Anxiety disorder, unspecified: Secondary | ICD-10-CM | POA: Diagnosis present

## 2014-07-31 DIAGNOSIS — M549 Dorsalgia, unspecified: Secondary | ICD-10-CM | POA: Diagnosis present

## 2014-07-31 DIAGNOSIS — G8929 Other chronic pain: Secondary | ICD-10-CM | POA: Diagnosis present

## 2014-07-31 DIAGNOSIS — Z885 Allergy status to narcotic agent status: Secondary | ICD-10-CM | POA: Diagnosis not present

## 2014-07-31 DIAGNOSIS — M1611 Unilateral primary osteoarthritis, right hip: Principal | ICD-10-CM | POA: Diagnosis present

## 2014-07-31 DIAGNOSIS — Z96649 Presence of unspecified artificial hip joint: Secondary | ICD-10-CM

## 2014-07-31 DIAGNOSIS — J449 Chronic obstructive pulmonary disease, unspecified: Secondary | ICD-10-CM | POA: Diagnosis present

## 2014-07-31 DIAGNOSIS — M169 Osteoarthritis of hip, unspecified: Secondary | ICD-10-CM | POA: Diagnosis not present

## 2014-07-31 DIAGNOSIS — Z471 Aftercare following joint replacement surgery: Secondary | ICD-10-CM | POA: Diagnosis not present

## 2014-07-31 DIAGNOSIS — Z7982 Long term (current) use of aspirin: Secondary | ICD-10-CM

## 2014-07-31 DIAGNOSIS — I471 Supraventricular tachycardia: Secondary | ICD-10-CM | POA: Diagnosis present

## 2014-07-31 DIAGNOSIS — E785 Hyperlipidemia, unspecified: Secondary | ICD-10-CM | POA: Diagnosis present

## 2014-07-31 DIAGNOSIS — I059 Rheumatic mitral valve disease, unspecified: Secondary | ICD-10-CM | POA: Diagnosis present

## 2014-07-31 DIAGNOSIS — Z87891 Personal history of nicotine dependence: Secondary | ICD-10-CM | POA: Diagnosis not present

## 2014-07-31 DIAGNOSIS — Z96641 Presence of right artificial hip joint: Secondary | ICD-10-CM | POA: Diagnosis not present

## 2014-07-31 DIAGNOSIS — M1990 Unspecified osteoarthritis, unspecified site: Secondary | ICD-10-CM | POA: Diagnosis present

## 2014-07-31 DIAGNOSIS — M25551 Pain in right hip: Secondary | ICD-10-CM | POA: Diagnosis not present

## 2014-07-31 HISTORY — PX: TOTAL HIP ARTHROPLASTY: SHX124

## 2014-07-31 SURGERY — ARTHROPLASTY, HIP, TOTAL, ANTERIOR APPROACH
Anesthesia: General | Site: Hip | Laterality: Right

## 2014-07-31 MED ORDER — ACETAMINOPHEN 650 MG RE SUPP: 650.0000 mg | Freq: Four times a day (QID) | RECTAL | Status: DC | PRN

## 2014-07-31 MED ORDER — NEOSTIGMINE METHYLSULFATE 10 MG/10ML IV SOLN
INTRAVENOUS | Status: AC
  Filled 2014-07-31: qty 1

## 2014-07-31 MED ORDER — FENTANYL CITRATE 0.05 MG/ML IJ SOLN
25.0000 ug | INTRAMUSCULAR | Status: DC | PRN
  Administered 2014-07-31 (×3): 50 ug via INTRAVENOUS

## 2014-07-31 MED ORDER — METOCLOPRAMIDE HCL 5 MG/ML IJ SOLN: 5.0000 mg | Freq: Three times a day (TID) | INTRAMUSCULAR | Status: DC | PRN

## 2014-07-31 MED ORDER — DEXAMETHASONE SODIUM PHOSPHATE 4 MG/ML IJ SOLN
INTRAMUSCULAR | Status: AC
  Filled 2014-07-31: qty 2

## 2014-07-31 MED ORDER — DOCUSATE SODIUM 100 MG PO CAPS: 100.0000 mg | ORAL_CAPSULE | Freq: Two times a day (BID) | ORAL | Status: DC

## 2014-07-31 MED ORDER — GLYCOPYRROLATE 0.2 MG/ML IJ SOLN
INTRAMUSCULAR | Status: DC | PRN
  Administered 2014-07-31: 0.2 mg via INTRAVENOUS
  Administered 2014-07-31: 0.4 mg via INTRAVENOUS

## 2014-07-31 MED ORDER — DEXAMETHASONE SODIUM PHOSPHATE 10 MG/ML IJ SOLN
10.0000 mg | Freq: Once | INTRAMUSCULAR | Status: AC
  Administered 2014-08-01: 10 mg via INTRAVENOUS
  Filled 2014-07-31: qty 1

## 2014-07-31 MED ORDER — OXYCODONE-ACETAMINOPHEN 5-325 MG PO TABS: 1.0000 | ORAL_TABLET | ORAL | Status: DC | PRN

## 2014-07-31 MED ORDER — HYDROCODONE-ACETAMINOPHEN 5-325 MG PO TABS
ORAL_TABLET | ORAL | Status: AC
  Filled 2014-07-31: qty 2

## 2014-07-31 MED ORDER — CLONAZEPAM 1 MG PO TABS
1.0000 mg | ORAL_TABLET | Freq: Every day | ORAL | Status: DC
  Administered 2014-07-31 – 2014-08-01 (×2): 1 mg via ORAL
  Filled 2014-07-31 (×2): qty 1

## 2014-07-31 MED ORDER — ROCURONIUM BROMIDE 100 MG/10ML IV SOLN
INTRAVENOUS | Status: DC | PRN
  Administered 2014-07-31: 40 mg via INTRAVENOUS

## 2014-07-31 MED ORDER — LACTATED RINGERS IV SOLN
INTRAVENOUS | Status: DC | PRN
  Administered 2014-07-31 (×2): via INTRAVENOUS

## 2014-07-31 MED ORDER — PROPOFOL 10 MG/ML IV BOLUS
INTRAVENOUS | Status: AC
  Filled 2014-07-31: qty 20

## 2014-07-31 MED ORDER — HYDROMORPHONE HCL 1 MG/ML IJ SOLN
INTRAMUSCULAR | Status: AC
  Filled 2014-07-31: qty 1

## 2014-07-31 MED ORDER — FENTANYL CITRATE 0.05 MG/ML IJ SOLN
INTRAMUSCULAR | Status: AC
  Filled 2014-07-31: qty 2

## 2014-07-31 MED ORDER — BUPIVACAINE LIPOSOME 1.3 % IJ SUSP
20.0000 mL | INTRAMUSCULAR | Status: AC
  Administered 2014-07-31: 20 mL
  Filled 2014-07-31: qty 20

## 2014-07-31 MED ORDER — ROCURONIUM BROMIDE 50 MG/5ML IV SOLN
INTRAVENOUS | Status: AC
  Filled 2014-07-31: qty 1

## 2014-07-31 MED ORDER — PROMETHAZINE HCL 25 MG/ML IJ SOLN: 6.2500 mg | INTRAMUSCULAR | Status: DC | PRN

## 2014-07-31 MED ORDER — CEFAZOLIN SODIUM-DEXTROSE 2-3 GM-% IV SOLR
2.0000 g | Freq: Four times a day (QID) | INTRAVENOUS | Status: AC
  Administered 2014-07-31 (×2): 2 g via INTRAVENOUS
  Filled 2014-07-31 (×2): qty 50

## 2014-07-31 MED ORDER — ONDANSETRON HCL 4 MG/2ML IJ SOLN
INTRAMUSCULAR | Status: AC
  Filled 2014-07-31: qty 2

## 2014-07-31 MED ORDER — ONDANSETRON HCL 4 MG PO TABS: 4.0000 mg | ORAL_TABLET | Freq: Four times a day (QID) | ORAL | Status: DC | PRN

## 2014-07-31 MED ORDER — POTASSIUM CHLORIDE IN NACL 20-0.45 MEQ/L-% IV SOLN
INTRAVENOUS | Status: AC
  Administered 2014-07-31: 22:00:00 via INTRAVENOUS
  Filled 2014-07-31 (×2): qty 1000

## 2014-07-31 MED ORDER — SERTRALINE HCL 100 MG PO TABS
100.0000 mg | ORAL_TABLET | Freq: Every day | ORAL | Status: DC
  Administered 2014-08-01 – 2014-08-02 (×2): 100 mg via ORAL
  Filled 2014-07-31 (×2): qty 1

## 2014-07-31 MED ORDER — CELECOXIB 100 MG PO CAPS: 100.0000 mg | ORAL_CAPSULE | Freq: Two times a day (BID) | ORAL | Status: DC

## 2014-07-31 MED ORDER — FENTANYL CITRATE 0.05 MG/ML IJ SOLN
INTRAMUSCULAR | Status: AC
  Filled 2014-07-31: qty 5

## 2014-07-31 MED ORDER — ACETAMINOPHEN 10 MG/ML IV SOLN
1000.0000 mg | Freq: Once | INTRAVENOUS | Status: AC
  Administered 2014-07-31: 1000 mg via INTRAVENOUS

## 2014-07-31 MED ORDER — APIXABAN 2.5 MG PO TABS: 2.5000 mg | ORAL_TABLET | Freq: Two times a day (BID) | ORAL | Status: DC

## 2014-07-31 MED ORDER — BUPIVACAINE-EPINEPHRINE (PF) 0.5% -1:200000 IJ SOLN
INTRAMUSCULAR | Status: DC | PRN
  Administered 2014-07-31: 30 mL

## 2014-07-31 MED ORDER — FENTANYL CITRATE 0.05 MG/ML IJ SOLN
INTRAMUSCULAR | Status: DC | PRN
  Administered 2014-07-31: 150 ug via INTRAVENOUS
  Administered 2014-07-31 (×2): 50 ug via INTRAVENOUS

## 2014-07-31 MED ORDER — CHLORHEXIDINE GLUCONATE 0.12 % MT SOLN
15.0000 mL | Freq: Two times a day (BID) | OROMUCOSAL | Status: DC
  Administered 2014-07-31 – 2014-08-02 (×4): 15 mL via OROMUCOSAL
  Filled 2014-07-31 (×6): qty 15

## 2014-07-31 MED ORDER — MIDAZOLAM HCL 5 MG/5ML IJ SOLN
INTRAMUSCULAR | Status: DC | PRN
  Administered 2014-07-31: 2 mg via INTRAVENOUS

## 2014-07-31 MED ORDER — BACLOFEN 10 MG PO TABS
20.0000 mg | ORAL_TABLET | Freq: Three times a day (TID) | ORAL | Status: DC
  Administered 2014-07-31 – 2014-08-02 (×6): 20 mg via ORAL
  Filled 2014-07-31 (×6): qty 2

## 2014-07-31 MED ORDER — METHOCARBAMOL 1000 MG/10ML IJ SOLN
500.0000 mg | INTRAVENOUS | Status: AC
  Administered 2014-07-31: 500 mg via INTRAVENOUS
  Filled 2014-07-31: qty 5

## 2014-07-31 MED ORDER — ATENOLOL 50 MG PO TABS
25.0000 mg | ORAL_TABLET | Freq: Two times a day (BID) | ORAL | Status: DC
  Administered 2014-07-31 – 2014-08-02 (×4): 25 mg via ORAL
  Filled 2014-07-31 (×4): qty 1

## 2014-07-31 MED ORDER — DEXAMETHASONE SODIUM PHOSPHATE 10 MG/ML IJ SOLN
INTRAMUSCULAR | Status: DC | PRN
  Administered 2014-07-31: 8 mg via INTRAVENOUS

## 2014-07-31 MED ORDER — ACETAMINOPHEN 10 MG/ML IV SOLN
INTRAVENOUS | Status: AC
  Filled 2014-07-31: qty 100

## 2014-07-31 MED ORDER — PHENOL 1.4 % MT LIQD: 1.0000 | OROMUCOSAL | Status: DC | PRN

## 2014-07-31 MED ORDER — ONDANSETRON HCL 4 MG/2ML IJ SOLN: 4.0000 mg | Freq: Four times a day (QID) | INTRAMUSCULAR | Status: DC | PRN

## 2014-07-31 MED ORDER — EPHEDRINE SULFATE 50 MG/ML IJ SOLN
INTRAMUSCULAR | Status: AC
  Filled 2014-07-31: qty 1

## 2014-07-31 MED ORDER — ACETAMINOPHEN 325 MG PO TABS: 650.0000 mg | ORAL_TABLET | Freq: Four times a day (QID) | ORAL | Status: DC | PRN

## 2014-07-31 MED ORDER — METOCLOPRAMIDE HCL 5 MG PO TABS: 5.0000 mg | ORAL_TABLET | Freq: Three times a day (TID) | ORAL | Status: DC | PRN

## 2014-07-31 MED ORDER — HYDROMORPHONE HCL 1 MG/ML IJ SOLN
0.5000 mg | INTRAMUSCULAR | Status: DC | PRN
  Administered 2014-07-31 – 2014-08-02 (×11): 0.5 mg via INTRAVENOUS
  Filled 2014-07-31 (×10): qty 1

## 2014-07-31 MED ORDER — 0.9 % SODIUM CHLORIDE (POUR BTL) OPTIME
TOPICAL | Status: DC | PRN
  Administered 2014-07-31: 1000 mL

## 2014-07-31 MED ORDER — CELECOXIB 200 MG PO CAPS
200.0000 mg | ORAL_CAPSULE | Freq: Two times a day (BID) | ORAL | Status: DC
  Administered 2014-07-31 – 2014-08-02 (×4): 200 mg via ORAL
  Filled 2014-07-31 (×4): qty 1

## 2014-07-31 MED ORDER — SCOPOLAMINE 1 MG/3DAYS TD PT72
MEDICATED_PATCH | TRANSDERMAL | Status: AC
  Filled 2014-07-31: qty 1

## 2014-07-31 MED ORDER — GLYCOPYRROLATE 0.2 MG/ML IJ SOLN
INTRAMUSCULAR | Status: AC
  Filled 2014-07-31: qty 2

## 2014-07-31 MED ORDER — LACTATED RINGERS IV SOLN: INTRAVENOUS | Status: DC

## 2014-07-31 MED ORDER — LIDOCAINE HCL (CARDIAC) 20 MG/ML IV SOLN
INTRAVENOUS | Status: DC | PRN
  Administered 2014-07-31: 100 mg via INTRAVENOUS

## 2014-07-31 MED ORDER — DOCUSATE SODIUM 100 MG PO CAPS
100.0000 mg | ORAL_CAPSULE | Freq: Two times a day (BID) | ORAL | Status: DC
  Administered 2014-07-31 – 2014-08-02 (×4): 100 mg via ORAL
  Filled 2014-07-31 (×4): qty 1

## 2014-07-31 MED ORDER — ARTIFICIAL TEARS OP OINT
TOPICAL_OINTMENT | OPHTHALMIC | Status: AC
  Filled 2014-07-31: qty 3.5

## 2014-07-31 MED ORDER — HYDROCODONE-ACETAMINOPHEN 5-325 MG PO TABS
1.0000 | ORAL_TABLET | ORAL | Status: DC | PRN
  Administered 2014-07-31 – 2014-08-01 (×2): 2 via ORAL
  Filled 2014-07-31: qty 2

## 2014-07-31 MED ORDER — ONDANSETRON HCL 4 MG PO TABS: 4.0000 mg | ORAL_TABLET | Freq: Three times a day (TID) | ORAL | Status: DC | PRN

## 2014-07-31 MED ORDER — LIDOCAINE HCL (CARDIAC) 20 MG/ML IV SOLN
INTRAVENOUS | Status: AC
  Filled 2014-07-31: qty 10

## 2014-07-31 MED ORDER — METHOCARBAMOL 1000 MG/10ML IJ SOLN
500.0000 mg | Freq: Four times a day (QID) | INTRAVENOUS | Status: DC | PRN
  Filled 2014-07-31: qty 5

## 2014-07-31 MED ORDER — METHOCARBAMOL 500 MG PO TABS
500.0000 mg | ORAL_TABLET | Freq: Four times a day (QID) | ORAL | Status: DC | PRN
Start: 2014-07-31 — End: 2014-08-02
  Administered 2014-07-31 – 2014-08-02 (×3): 500 mg via ORAL
  Filled 2014-07-31 (×3): qty 1

## 2014-07-31 MED ORDER — MIDAZOLAM HCL 2 MG/2ML IJ SOLN
INTRAMUSCULAR | Status: AC
  Filled 2014-07-31: qty 2

## 2014-07-31 MED ORDER — FENTANYL CITRATE 0.05 MG/ML IJ SOLN
INTRAMUSCULAR | Status: AC
  Administered 2014-07-31: 50 ug via INTRAVENOUS
  Filled 2014-07-31: qty 2

## 2014-07-31 MED ORDER — MEPERIDINE HCL 25 MG/ML IJ SOLN: 6.2500 mg | INTRAMUSCULAR | Status: DC | PRN

## 2014-07-31 MED ORDER — MENTHOL 3 MG MT LOZG: 1.0000 | LOZENGE | OROMUCOSAL | Status: DC | PRN

## 2014-07-31 MED ORDER — PROPOFOL 10 MG/ML IV BOLUS
INTRAVENOUS | Status: DC | PRN
  Administered 2014-07-31: 120 mg via INTRAVENOUS

## 2014-07-31 MED ORDER — NEOSTIGMINE METHYLSULFATE 10 MG/10ML IV SOLN
INTRAVENOUS | Status: DC | PRN
  Administered 2014-07-31: 3 mg via INTRAVENOUS

## 2014-07-31 MED ORDER — VERAPAMIL HCL ER 240 MG PO TBCR
240.0000 mg | EXTENDED_RELEASE_TABLET | Freq: Every day | ORAL | Status: DC
  Administered 2014-07-31 – 2014-08-01 (×2): 240 mg via ORAL
  Filled 2014-07-31 (×3): qty 1

## 2014-07-31 MED ORDER — STERILE WATER FOR INJECTION IJ SOLN
INTRAMUSCULAR | Status: AC
  Filled 2014-07-31: qty 10

## 2014-07-31 MED ORDER — ONDANSETRON HCL 4 MG/2ML IJ SOLN
INTRAMUSCULAR | Status: DC | PRN
  Administered 2014-07-31 (×2): 4 mg via INTRAVENOUS

## 2014-07-31 MED ORDER — SCOPOLAMINE 1 MG/3DAYS TD PT72: 1.0000 | MEDICATED_PATCH | TRANSDERMAL | Status: DC

## 2014-07-31 MED ORDER — BUPIVACAINE LIPOSOME 1.3 % IJ SUSP
20.0000 mL | INTRAMUSCULAR | Status: DC
  Filled 2014-07-31: qty 20

## 2014-07-31 MED ORDER — ATORVASTATIN CALCIUM 10 MG PO TABS
10.0000 mg | ORAL_TABLET | Freq: Every day | ORAL | Status: DC
  Administered 2014-07-31 – 2014-08-02 (×3): 10 mg via ORAL
  Filled 2014-07-31 (×3): qty 1

## 2014-07-31 SURGICAL SUPPLY — 54 items
BAG DECANTER FOR FLEXI CONT (MISCELLANEOUS) ×2 IMPLANT
BLADE SAW SGTL 18X1.27X75 (BLADE) ×2 IMPLANT
BLADE SURG ROTATE 9660 (MISCELLANEOUS) IMPLANT
CAPT HIP TOTAL 2 ×2 IMPLANT
CLSR STERI-STRIP ANTIMIC 1/2X4 (GAUZE/BANDAGES/DRESSINGS) ×2 IMPLANT
COVER SURGICAL LIGHT HANDLE (MISCELLANEOUS) ×2 IMPLANT
DRAPE C-ARM 42X72 X-RAY (DRAPES) IMPLANT
DRAPE IMP U-DRAPE 54X76 (DRAPES) ×2 IMPLANT
DRAPE INCISE IOBAN 66X45 STRL (DRAPES) ×2 IMPLANT
DRAPE ORTHO SPLIT 77X108 STRL (DRAPES) ×2
DRAPE SURG ORHT 6 SPLT 77X108 (DRAPES) ×2 IMPLANT
DRAPE U-SHAPE 47X51 STRL (DRAPES) ×2 IMPLANT
DRSG AQUACEL AG ADV 3.5X10 (GAUZE/BANDAGES/DRESSINGS) ×2 IMPLANT
DRSG MEPILEX BORDER 4X8 (GAUZE/BANDAGES/DRESSINGS) ×2 IMPLANT
DURAPREP 26ML APPLICATOR (WOUND CARE) ×2 IMPLANT
ELECT BLADE 4.0 EZ CLEAN MEGAD (MISCELLANEOUS) ×2
ELECT REM PT RETURN 9FT ADLT (ELECTROSURGICAL) ×2
ELECTRODE BLDE 4.0 EZ CLN MEGD (MISCELLANEOUS) ×1 IMPLANT
ELECTRODE REM PT RTRN 9FT ADLT (ELECTROSURGICAL) ×1 IMPLANT
FACESHIELD WRAPAROUND (MASK) ×4 IMPLANT
GLOVE BIO SURGEON STRL SZ7 (GLOVE) ×2 IMPLANT
GLOVE BIO SURGEON STRL SZ7.5 (GLOVE) ×2 IMPLANT
GLOVE BIOGEL PI IND STRL 7.0 (GLOVE) ×1 IMPLANT
GLOVE BIOGEL PI IND STRL 8 (GLOVE) ×1 IMPLANT
GLOVE BIOGEL PI INDICATOR 7.0 (GLOVE) ×1
GLOVE BIOGEL PI INDICATOR 8 (GLOVE) ×1
GOWN STRL REUS W/ TWL LRG LVL3 (GOWN DISPOSABLE) ×2 IMPLANT
GOWN STRL REUS W/ TWL XL LVL3 (GOWN DISPOSABLE) ×1 IMPLANT
GOWN STRL REUS W/TWL LRG LVL3 (GOWN DISPOSABLE) ×2
GOWN STRL REUS W/TWL XL LVL3 (GOWN DISPOSABLE) ×1
KIT BASIN OR (CUSTOM PROCEDURE TRAY) ×2 IMPLANT
KIT ROOM TURNOVER OR (KITS) ×2 IMPLANT
MANIFOLD NEPTUNE II (INSTRUMENTS) ×2 IMPLANT
NDL SAFETY ECLIPSE 18X1.5 (NEEDLE) ×1 IMPLANT
NEEDLE 18GX1X1/2 (RX/OR ONLY) (NEEDLE) ×2 IMPLANT
NEEDLE HYPO 18GX1.5 SHARP (NEEDLE) ×1
NS IRRIG 1000ML POUR BTL (IV SOLUTION) ×2 IMPLANT
PACK TOTAL JOINT (CUSTOM PROCEDURE TRAY) ×2 IMPLANT
PACK UNIVERSAL I (CUSTOM PROCEDURE TRAY) ×2 IMPLANT
PAD ARMBOARD 7.5X6 YLW CONV (MISCELLANEOUS) ×2 IMPLANT
SPONGE LAP 18X18 X RAY DECT (DISPOSABLE) IMPLANT
SUT MNCRL AB 4-0 PS2 18 (SUTURE) ×2 IMPLANT
SUT MON AB 2-0 CT1 36 (SUTURE) ×2 IMPLANT
SUT VIC AB 0 CT1 27 (SUTURE) ×1
SUT VIC AB 0 CT1 27XBRD ANBCTR (SUTURE) ×1 IMPLANT
SUT VIC AB 1 CT1 27 (SUTURE) ×1
SUT VIC AB 1 CT1 27XBRD ANBCTR (SUTURE) ×1 IMPLANT
SYR 50ML LL SCALE MARK (SYRINGE) ×2 IMPLANT
SYRINGE 20CC LL (MISCELLANEOUS) IMPLANT
TOWEL OR 17X24 6PK STRL BLUE (TOWEL DISPOSABLE) ×2 IMPLANT
TOWEL OR 17X26 10 PK STRL BLUE (TOWEL DISPOSABLE) ×2 IMPLANT
TOWEL OR NON WOVEN STRL DISP B (DISPOSABLE) ×2 IMPLANT
TRAY FOLEY CATH 16FRSI W/METER (SET/KITS/TRAYS/PACK) IMPLANT
WATER STERILE IRR 1000ML POUR (IV SOLUTION) ×2 IMPLANT

## 2014-07-31 NOTE — Interval H&P Note (Signed)
History and Physical Interval Note:  07/31/2014 7:55 AM  [name]  has presented today for surgery, with the diagnosis of OA RIGHT HIP  The various methods of treatment have been discussed with the patient and family. After consideration of risks, benefits and other options for treatment, the patient has consented to  Procedure(s): TOTAL HIP ARTHROPLASTY ANTERIOR APPROACH (Right) as a surgical intervention .  The patient's history has been reviewed, patient examined, no change in status, stable for surgery.  I have reviewed the patient's chart and labs.  Questions were answered to the patient's satisfaction.     Zamora Colton D

## 2014-07-31 NOTE — Anesthesia Postprocedure Evaluation (Signed)
  Anesthesia Post-op Note  Patient: [name]  Procedure(s) Performed: Procedure(s): TOTAL HIP ARTHROPLASTY ANTERIOR APPROACH (Right)  Patient Location: PACU  Anesthesia Type:General  Level of Consciousness: awake and alert   Airway and Oxygen Therapy: Patient Spontanous Breathing and Patient connected to nasal cannula oxygen  Post-op Pain: mild  Post-op Assessment: Post-op Vital signs reviewed, Patient's Cardiovascular Status Stable, Respiratory Function Stable, Patent Airway and No signs of Nausea or vomiting  Post-op Vital Signs: Reviewed and stable  Last Vitals:  Filed Vitals:   07/31/14 1245  BP: 117/61  Pulse: 68  Temp: 36.3 C  Resp: 14    Complications: No apparent anesthesia complications

## 2014-07-31 NOTE — Discharge Instructions (Signed)

## 2014-07-31 NOTE — Progress Notes (Signed)
Utilization review completed.  

## 2014-07-31 NOTE — Transfer of Care (Signed)
Immediate Anesthesia Transfer of Care Note  Patient: [name]  Procedure(s) Performed: Procedure(s): TOTAL HIP ARTHROPLASTY ANTERIOR APPROACH (Right)  Patient Location: PACU  Anesthesia Type:General  Level of Consciousness: awake, alert  and oriented  Airway & Oxygen Therapy: Patient Spontanous Breathing and Patient connected to nasal cannula oxygen  Post-op Assessment: Report given to RN and Post -op Vital signs reviewed and stable  Post vital signs: Reviewed and stable  Last Vitals:  Filed Vitals:   07/31/14 0838  BP: 106/48  Pulse: 71  Temp: 36.7 C  Resp: 16    Complications: No apparent anesthesia complications

## 2014-07-31 NOTE — Anesthesia Procedure Notes (Signed)
Procedure Name: Intubation Date/Time: 07/31/2014 10:42 AM Performed by: [name] Pre-anesthesia Checklist: Patient identified, Emergency Drugs available, Suction available, Patient being monitored and Timeout performed Patient Re-evaluated:Patient Re-evaluated prior to inductionOxygen Delivery Method: Circle system utilized Preoxygenation: Pre-oxygenation with 100% oxygen Intubation Type: IV induction Ventilation: Mask ventilation without difficulty Laryngoscope Size: Mac and 3 Grade View: Grade I Tube type: Oral Tube size: 7.0 mm Number of attempts: 1 Airway Equipment and Method: Stylet and LTA kit utilized Placement Confirmation: ETT inserted through vocal cords under direct vision,  positive ETCO2 and breath sounds checked- equal and bilateral Secured at: 21 cm Tube secured with: Tape Dental Injury: Teeth and Oropharynx as per pre-operative assessment

## 2014-07-31 NOTE — Op Note (Signed)
07/31/2014  11:55 AM  PATIENT:  [name]   MRN: [id number]  PRE-OPERATIVE DIAGNOSIS:  OA RIGHT HIP  POST-OPERATIVE DIAGNOSIS:  OA RIGHT HIP  PROCEDURE:  Procedure(s): TOTAL HIP ARTHROPLASTY ANTERIOR APPROACH  PREOPERATIVE INDICATIONS:    [name] is an 64 y.o. female who has a diagnosis of Primary osteoarthritis of right hip and elected for surgical management after failing conservative treatment.  The risks benefits and alternatives were discussed with the patient including but not limited to the risks of nonoperative treatment, versus surgical intervention including infection, bleeding, nerve injury, periprosthetic fracture, the need for revision surgery, dislocation, leg length discrepancy, blood clots, cardiopulmonary complications, morbidity, mortality, among others, and they were willing to proceed.     OPERATIVE REPORT     SURGEON:   Chalene Treu, Ernesta Amble, MD    ASSISTANT:  Lovett Calender, PA-C, She was present and scrubbed throughout the case, critical for completion in a timely fashion, and for retraction, instrumentation, and closure.     ANESTHESIA:  General    COMPLICATIONS:  None.     COMPONENTS:  Stryker acolade fit femur size 2 with a 36 mm +0 head ball and a PSL acetabular shell size 46 with a  polyethylene liner    PROCEDURE IN DETAIL:   The patient was met in the holding area and  identified.  The appropriate hip was identified and marked at the operative site.  The patient was then transported to the OR  and  placed under general anesthesia.  At that point, the patient was  placed in the supine position and  secured to the operating room table and all bony prominences padded. He received pre-operative antibiotics    The operative lower extremity was prepped from the iliac crest to the distal leg.  Sterile draping was performed.  Time out was performed prior to incision.      Skin incision was made just 2 cm lateral to the ASIS  extending in line with the  tensor fascia lata. Electrocautery was used to control all bleeders. I dissected down sharply to the fascia of the tensor fascia lata was confirmed that the muscle fibers beneath were running posteriorly. I then incised the fascia over the superficial tensor fascia lata in line with the incision. The fascia was elevated off the anterior aspect of the muscle the muscle was retracted posteriorly and protected throughout the case. I then used electrocautery to incise the tensor fascia lata fascia control and all bleeders. Immediately visible was the fat over top of the anterior neck and capsule.  I removed the anterior fat from the capsule and elevated the rectus muscle off of the anterior capsule. I then removed a large time of capsule. The retractors were then placed over the anterior acetabulum as well as around the superior and inferior neck.  I then removed a section of the femoral neck and a napkin ring fashion. Then used the power course to remove the femoral head from the acetabulum and thoroughly irrigated the acetabulum. I sized the femoral head.    I then exposed the deep acetabulum, cleared out any tissue including the ligamentum teres.   After adequate visualization, I excised the labrum, and then sequentially reamed.  I placed the trial acetabulum, which seated nicely, and then impacted the real cup into place.  Appropriate version and inclination was confirmed clinically matching their bony anatomy, and also with the use transverse acetabular ligament.  I placed a 59mm screw in the posterior/superio  position with an excellent bite.    I then placed the polyethylene liner in place  I then abducted the leg and released the external rotators from the posterior femur allowing it to be easily delivered up lateral and anterior to the acetabulum for preparation of the femoral canal.    I then prepared the proximal femur using the cookie-cutter and then sequentially reamed and broached.  A trial  broach, neck, and head was utilized, and I reduced the hip and it was found to have excellent stability with functional range of motion..  I then impacted the real femoral prosthesis into place into the appropriate version, slightly anteverted to the normal anatomy, and I impacted the real head ball into place. The hip was then reduced and taken through functional range of motion and found to have excellent stability. Leg lengths were restored.  I then irrigated the hip copiously again with, and repaired the fascia with Vicryl, followed by monocryl for the subcutaneous tissue, Monocryl for the skin, Steri-Strips and sterile gauze. The wounds were injected. The patient was then awakened and returned to PACU in stable and satisfactory condition. There were no complications.  POST OPERATIVE PLAN: WBAT, DVT px: SCD's/TED and Eloquis  Edmonia Lynch, MD Orthopedic Surgeon [phone]   This note was generated using a template and dragon dictation system. In light of that, I have reviewed the note and all aspects of it are applicable to this case. Any dictation errors are due to the computerized dictation system.

## 2014-08-01 ENCOUNTER — Telehealth: Payer: Self-pay | Admitting: Cardiovascular Disease

## 2014-08-01 ENCOUNTER — Encounter (HOSPITAL_COMMUNITY): Payer: Self-pay | Admitting: Orthopedic Surgery

## 2014-08-01 MED ORDER — MUPIROCIN 2 % EX OINT
1.0000 "application " | TOPICAL_OINTMENT | Freq: Two times a day (BID) | CUTANEOUS | Status: DC
  Administered 2014-08-01 – 2014-08-02 (×2): 1 via NASAL
  Filled 2014-08-01: qty 22

## 2014-08-01 MED ORDER — OXYCODONE-ACETAMINOPHEN 5-325 MG PO TABS
1.0000 | ORAL_TABLET | ORAL | Status: DC | PRN
  Administered 2014-08-01 – 2014-08-02 (×5): 2 via ORAL
  Filled 2014-08-01 (×5): qty 2

## 2014-08-01 MED ORDER — CHLORHEXIDINE GLUCONATE CLOTH 2 % EX PADS
6.0000 | MEDICATED_PAD | Freq: Every day | CUTANEOUS | Status: DC
  Administered 2014-08-02: 6 via TOPICAL

## 2014-08-01 MED ORDER — APIXABAN 2.5 MG PO TABS: 2.5000 mg | ORAL_TABLET | Freq: Two times a day (BID) | ORAL | Status: DC

## 2014-08-01 NOTE — Progress Notes (Signed)
     Subjective:  POD#1 RATH arthroplasty. Patient reports pain as moderate.  Resting comfortably in bed.  Reports the Norco pain medication that she took at home before surgery is not helping her pain now.  Requesting stronger pain medication.  Objective:   VITALS:   Filed Vitals:   08/01/14 0000 08/01/14 0138 08/01/14 0400 08/01/14 0452  BP:  102/48  110/50  Pulse:  78  70  Temp:  98.8 F (37.1 C)  98.4 F (36.9 C)  TempSrc:      Resp: 16 16 16 16   Height:      Weight:      SpO2: 98% 97% 99% 100%    Neurologically intact ABD soft Neurovascular intact Sensation intact distally Intact pulses distally Dorsiflexion/Plantar flexion intact Incision: scant drainage Dressing changed per patient request.  Lab Results  Component Value Date   WBC 6.7 07/18/2014   HGB 15.4* 07/18/2014   HCT 47.4* 07/18/2014   MCV 104.2* 07/18/2014   PLT 253 07/18/2014   BMET    Component Value Date/Time   NA 143 07/18/2014 1116   K 4.0 07/18/2014 1116   CL 105 07/18/2014 1116   CO2 29 07/18/2014 1116   GLUCOSE 74 07/18/2014 1116   BUN 16 07/18/2014 1116   CREATININE 0.83 07/18/2014 1116   CREATININE 0.71 02/23/2013 1342   CALCIUM 10.4 07/18/2014 1116   GFRNONAA 75* 07/18/2014 1116   GFRAA 86* 07/18/2014 1116     Assessment/Plan: 1 Day Post-Op   Principal Problem:   Primary osteoarthritis of right hip Active Problems:   DJD (degenerative joint disease)   Up with therapy WBAT in the RLE Eliquis 2.5 mg daily for DVT prophylaxis Patient would like to go home after recovery in the hospital, but we will see how she does today with PT/OT.  Requesting home health that she has worked with before, so will have case management look into this.   [name] 08/01/2014, 7:00 AM Cell [phone]

## 2014-08-01 NOTE — Evaluation (Signed)
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  She has significant PMHx of anxiety, depression, PSVT, chronic back pain, COPD, cervical disc surgery, knee surgery, and multiple low back surgeries.   Clinical Impression   PTA pt lived at home and was independent with ADLs with occasional use of SPC. Pt currently at Supervision and close to Mod I level for ADLs and all education completed at this time. No further acute OT needs.     Follow Up Recommendations  No OT follow up    Equipment Recommendations  3 in 1 bedside comode    Recommendations for Other Services       Precautions / Restrictions Precautions Precautions: None Restrictions RLE Weight Bearing: Weight bearing as tolerated      Mobility Bed Mobility Overal bed mobility: Needs Assistance Bed Mobility: Sit to Supine     Supine to sit: Modified independent (Device/Increase time) Sit to supine: Modified independent (Device/Increase time)   General bed mobility comments: pt hooking her leg with her other leg to get it back into the bed.   Transfers Overall transfer level: Needs assistance Equipment used: Rolling walker (2 wheeled) Transfers: Sit to/from Stand Sit to Stand: Modified independent (Device/Increase time)         General transfer comment: Pt able to demonstrate safe transitions with use of hands for control.     Balance Overall balance assessment: Needs assistance Sitting-balance support: Feet supported;No upper extremity supported Sitting balance-Leahy Scale: Good     Standing balance support: Bilateral upper extremity supported;No upper extremity supported;Single extremity supported Standing balance-Leahy Scale: Good                              ADL Overall ADL's : Needs  assistance/impaired                                       General ADL Comments: Pt overall Supervision for functional mobility and progressing towards Mod I for ADLs. Pt able to don bilateral socks in long sitting on bed without difficulty. Educated pt on slowly increasing activity. Educated on compensatory techniques.                Pertinent Vitals/Pain Pain Assessment: 0-10 Pain Score: 3  Pain Location: right leg Pain Descriptors / Indicators: Aching;Burning Pain Intervention(s): Limited activity within patient's tolerance;Monitored during session;Repositioned;Ice applied     Hand Dominance Right   Extremity/Trunk Assessment Upper Extremity Assessment Upper Extremity Assessment: Overall WFL for tasks assessed   Lower Extremity Assessment Lower Extremity Assessment: Defer to PT evaluation   Cervical / Trunk Assessment Cervical / Trunk Assessment: Other exceptions Cervical / Trunk Exceptions: history of neck and low back surgery   Communication Communication Communication: No difficulties   Cognition Arousal/Alertness: Awake/alert Behavior During Therapy: WFL for tasks assessed/performed Overall Cognitive Status: Within Functional Limits for tasks assessed                                Home Living Family/patient expects to be discharged to:: Private residence Living Arrangements: Spouse/significant other Available Help at Discharge: Family;Available 24 hours/day Type of Home: [place]  Access: Stairs to enter [employer] of Steps: 1 Entrance Stairs-Rails: None Home Layout: Two level Alternate Level Stairs-Number of Steps: 7 Alternate Level Stairs-Rails: Left     Bathroom Toilet: Standard     Home Equipment: Cane - single point          Prior Functioning/Environment Level of Independence: Independent        Comments: used cane only "when I have been on my leg a lot"    OT Diagnosis: Generalized  weakness;Acute pain         OT Goals(Current goals can be found in the care plan section) Acute Rehab OT Goals Patient Stated Goal: to do well enough to go home   End of Session Equipment Utilized During Treatment: Rolling walker Nurse Communication: Mobility status  Activity Tolerance: Patient tolerated treatment well Patient left: in chair;with call bell/phone within reach   Time: 1652-1718 OT Time Calculation (min): 26 min Charges:  OT General Charges $OT Visit: 1 Procedure OT Evaluation $Initial OT Evaluation Tier I: 1 Procedure OT Treatments $Therapeutic Activity: 8-22 mins G-Codes:    Villa Herb M Aug 10, 2014, 6:04 PM  Cyndie Chime, OTR/L Occupational Therapist [phone] (pager)

## 2014-08-01 NOTE — Telephone Encounter (Signed)
Spoke to [place] PA with ORTHO.  Patient had a total hip replacement. She received clearance Dr Claiborne Billings may use aspirin. Per Silas Flood, patient ''s  Primary recommend something stronger for anticoagulation Patient will be started on ELIQUIS FOR 35 DAYS- insurance will only approve for 32 days.BRITTAINY WANTED TO KNOW IF 32 days to be sufficient. RN informed Brittainy- office can supply  samples of ELIQUIS for patient to cover the 3 days. Brittainy will have patient's husband pick up 7 day supply of Eliquis

## 2014-08-01 NOTE — Progress Notes (Signed)
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  She has significant PMHx of anxiety, depression, PSVT, chronic back pain, COPD, cervical disc surgery, knee surgery, and multiple low back surgeries.    PT Comments    Pt is POD #1 and this is her second session.  She walked two laps around the unit and was able to do some short distance, in room walking without an assistive device and close supervision. She preformed standing hip exercises and is ready for stair practice in AM before d/c home.  PT continues to endorse OP PT at discharge because the pt is so high functioning and we may try a cane for gait tomorrow as well.   Follow Up Recommendations  Outpatient PT     Equipment Recommendations  Rolling walker with 5" wheels    Recommendations for Other Services   NA     Precautions / Restrictions Precautions Precautions: None Restrictions RLE Weight Bearing: Weight bearing as tolerated    Mobility  Bed Mobility Overal bed mobility: Needs Assistance Bed Mobility: Sit to Supine       Sit to supine: Modified independent (Device/Increase time)   General bed mobility comments: pt hooking her leg with her other leg to get it back into the bed.   Transfers Overall transfer level: Needs assistance Equipment used: Rolling walker (2 wheeled) Transfers: Sit to/from Stand Sit to Stand: Modified independent (Device/Increase time)         General transfer comment: Pt able to demonstrate safe transitions with use of hands for control.   Ambulation/Gait Ambulation/Gait assistance: Supervision Ambulation Distance (Feet): 1020 Feet Assistive device: Rolling walker (2 wheeled) Gait Pattern/deviations: Step-through pattern;Antalgic;Trunk flexed     General Gait Details: Verbal cues for upright  posture, light hands, even weight shift and gliding of RW.  Pt supervision due to abnormal gait pattern.  She elected to do two laps of the unit this PM. In her room she was able to ambulate without an assistive device and close supervision for ~15'.  Much slower and more antalgig gait speed.  We may try cane tomorrow.           Balance Overall balance assessment: Needs assistance Sitting-balance support: Feet supported;No upper extremity supported Sitting balance-Leahy Scale: Good     Standing balance support: Bilateral upper extremity supported;No upper extremity supported;Single extremity supported Standing balance-Leahy Scale: Good                      Cognition Arousal/Alertness: Awake/alert Behavior During Therapy: WFL for tasks assessed/performed Overall Cognitive Status: Within Functional Limits for tasks assessed                      Exercises Total Joint Exercises Hip ABduction/ADduction: AROM;Right;10 reps;Standing Knee Flexion: AROM;Right;10 reps;Standing Marching in Standing: AROM;Right;10 reps;Standing Standing Hip Extension: AROM;Right;10 reps;Standing        Pertinent Vitals/Pain Pain Assessment: 0-10 Pain Score: 3  Pain Location: right leg Pain Descriptors / Indicators: Aching;Burning Pain Intervention(s): Limited activity within patient's tolerance;Monitored during session;Repositioned;Ice applied           PT Goals (current goals can now be found in the care plan section) Acute Rehab PT Goals Patient Stated Goal: to do well enough to go home Progress towards PT goals: Progressing toward goals  Frequency  7X/week    PT Plan Current plan remains appropriate       End of Session   Activity Tolerance: Patient limited by pain Patient left: in bed;with call bell/phone within reach     Time: 1443-1517 PT Time Calculation (min) (ACUTE ONLY): 34 min  Charges:  $Gait Training: 8-22 mins $Therapeutic Exercise: 8-22 mins             Juliano Mceachin B. Natori Gudino, PT, DPT [phone]   08/01/2014, 5:10 PM

## 2014-08-01 NOTE — Evaluation (Signed)
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  She has significant PMHx of anxiety, depression, PSVT, chronic back pain, COPD, cervical disc surgery, knee surgery, and multiple low back surgeries.  Clinical Impression  Pt is POD #1 and is moving exceptionally well.  She was able to complete a lap around the unit with RW and supervision.  I anticipate she could transition straight to OP PT at discharge.   PT to follow acutely for deficits listed below.       Follow Up Recommendations Outpatient PT    Equipment Recommendations  Rolling walker with 5" wheels    Recommendations for Other Services   NA    Precautions / Restrictions Precautions Precautions: None Restrictions Weight Bearing Restrictions: Yes RLE Weight Bearing: Weight bearing as tolerated      Mobility  Bed Mobility Overal bed mobility: Needs Assistance Bed Mobility: Supine to Sit     Supine to sit: Modified independent (Device/Increase time)     General bed mobility comments: pt used bed rail and HOB mildly elevated ~20 degrees  Transfers Overall transfer level: Needs assistance Equipment used: Rolling walker (2 wheeled) Transfers: Sit to/from Stand Sit to Stand: Supervision         General transfer comment: supervision for safety  Ambulation/Gait Ambulation/Gait assistance: Supervision Ambulation Distance (Feet): 510 Feet Assistive device: Rolling walker (2 wheeled) Gait Pattern/deviations: Step-through pattern;Antalgic;Trunk flexed     General Gait Details: Pt with mildly antalgic gait pattern which improved with increased gait distance.  Verbal cues for smoothness of gait, light hands, upright posture and to leave RW in contact with the ground (glide it, don't pick it up).          Balance Overall balance  assessment: Needs assistance Sitting-balance support: Feet supported;No upper extremity supported Sitting balance-Leahy Scale: Good     Standing balance support: Bilateral upper extremity supported Standing balance-Leahy Scale: Fair                               Pertinent Vitals/Pain Pain Assessment: 0-10 Pain Score: 4  Pain Location: right leg Pain Descriptors / Indicators: Aching;Burning Pain Intervention(s): Limited activity within patient's tolerance;Monitored during session;Repositioned    Home Living Family/patient expects to be discharged to:: Private residence Living Arrangements: Spouse/significant other Available Help at Discharge: Family;Available 24 hours/day Type of Home: House Home Access: Stairs to enter Entrance Stairs-Rails: None Entrance Stairs-Number of Steps: 1 (stoop) Home Layout: Two level Home Equipment: Cane - single point      Prior Function Level of Independence: Independent         Comments: used cane only "when I have been on my leg a lot"     Hand Dominance   Dominant Hand: Right    Extremity/Trunk Assessment   Upper Extremity Assessment: Defer to OT evaluation           Lower Extremity Assessment: RLE deficits/detail RLE Deficits / Details: right leg with normal post op pain and weakness.  Ankle 4/5, knee 3/5, hip 2+/5    Cervical / Trunk Assessment: Other exceptions  Communication   Communication: No difficulties  Cognition Arousal/Alertness: Awake/alert Behavior During Therapy: WFL for tasks assessed/performed Overall Cognitive Status: Within Functional Limits for tasks assessed  Exercises Total Joint Exercises Ankle Circles/Pumps: AROM;Both;20 reps;Supine Quad Sets: AROM;Right;10 reps;Supine Heel Slides: AROM;Right;10 reps;Supine Hip ABduction/ADduction: AROM;Right;10 reps;Supine Long Arc Quad: AROM;Right;10 reps;Supine      Assessment/Plan    PT Assessment Patient  needs continued PT services  PT Diagnosis Difficulty walking;Abnormality of gait;Generalized weakness;Acute pain   PT Problem List Decreased strength;Decreased range of motion;Decreased activity tolerance;Decreased balance;Decreased mobility;Decreased knowledge of use of DME;Pain  PT Treatment Interventions DME instruction;Gait training;Stair training;Functional mobility training;Therapeutic activities;Balance training;Therapeutic exercise;Neuromuscular re-education;Patient/family education;Manual techniques;Modalities   PT Goals (Current goals can be found in the Care Plan section) Acute Rehab PT Goals Patient Stated Goal: to do well enough to go home PT Goal Formulation: With patient Time For Goal Achievement: 08/08/14 Potential to Achieve Goals: Good    Frequency 7X/week    End of Session   Activity Tolerance: Patient limited by pain Patient left: in chair;with call bell/phone within reach;with nursing/sitter in room           Time: 1000-1036 PT Time Calculation (min) (ACUTE ONLY): 36 min   Charges:   PT Evaluation $Initial PT Evaluation Tier I: 1 Procedure PT Treatments $Gait Training: 8-22 mins        Aroura Vasudevan B. Harriet Sutphen, PT, DPT [phone]   08/01/2014, 11:27 AM

## 2014-08-01 NOTE — Plan of Care (Signed)
Problem: Consults Goal: Diagnosis- Total Joint Replacement Primary Total Hip Right     

## 2014-08-02 NOTE — Care Management Note (Signed)
CARE MANAGEMENT NOTE 08/02/2014  Patient:  [name], [name]   Account Number:  [id number]  Date Initiated:  08/02/2014  Documentation initiated by:  Ricki Miller  Subjective/Objective Assessment:   64 yr old female admitted with right .     Action/Plan:   Case manager spoke with patient concerning home health and DME. Choice offered.  Referral called to [place], [facility] Liaison. Patient has RW, 3in1. HAs family support at discharge.   Anticipated DC Date:  08/03/2014   Anticipated DC Plan:  [place]  CM consult      [facility] Choice  HOME HEALTH  DURABLE MEDICAL EQUIPMENT   Choice offered to / List presented to:  C-1 Patient   DME arranged  [facility]      DME agency  [employer]     HH arranged  HH-2 PT      [place].   Status of service:  Completed, signed off Medicare Important Message given?  YES (If response is "NO", the following Medicare IM given date fields will be blank) Date Medicare IM given:  08/02/2014 Medicare IM given by:  Ricki Miller Date Additional Medicare IM given:   Additional Medicare IM given by:    Discharge Disposition:  [place]  Per UR Regulation:  Reviewed for med. necessity/level of care/duration of stay  If discussed at [place] of Stay Meetings, dates discussed:    Comments:

## 2014-08-02 NOTE — Discharge Summary (Signed)
Physician Discharge Summary  Patient ID: [name] MRN: [id number] DOB/AGE: 1951/03/14 64 y.o.  Admit date: 07/31/2014 Discharge date: 08/02/2014  Admission Diagnoses:  Primary osteoarthritis of right hip  Discharge Diagnoses:  Principal Problem:   Primary osteoarthritis of right hip Active Problems:   DJD (degenerative joint disease)   Past Medical History  Diagnosis Date  . Anxiety   . Arthritis   . Depression   . PSVT (paroxysmal supraventricular tachycardia)   . Heart murmur   . Back pain, chronic   . Dysrhythmia     hx svt  . COPD (chronic obstructive pulmonary disease)     emphysema no meds  . Complication of anesthesia     per patient "difficult stick- needs central line or picc"  . PONV (postoperative nausea and vomiting)     nausea occ    Surgeries: Procedure(s): TOTAL HIP ARTHROPLASTY ANTERIOR APPROACH on 07/31/2014   Consultants (if any):    Discharged Condition: Improved  Hospital Course: [name] is an 64 y.o. female who was admitted 07/31/2014 with a diagnosis of Primary osteoarthritis of right hip and went to the operating room on 07/31/2014 and underwent the above named procedures.    She was given perioperative antibiotics:  Anti-infectives    Start     Dose/Rate Route Frequency Ordered Stop   07/31/14 1645  ceFAZolin (ANCEF) IVPB 2 g/50 mL premix     2 g 100 mL/hr over 30 Minutes Intravenous Every 6 hours 07/31/14 1643 07/31/14 2237   07/31/14 0600  ceFAZolin (ANCEF) IVPB 2 g/50 mL premix     2 g 100 mL/hr over 30 Minutes Intravenous On call to O.R. 07/30/14 1352 07/31/14 1055   07/31/14 0600  vancomycin (VANCOCIN) IVPB 1000 mg/200 mL premix     1,000 mg 200 mL/hr over 60 Minutes Intravenous On call to O.R. 07/30/14 1354 07/31/14 1140    .  She was given sequential compression devices, early ambulation, and Eliquis 2.5mg  BID for DVT prophylaxis.  She benefited maximally from the hospital stay and there were no complications.     Recent vital signs:  Filed Vitals:   08/02/14 0449  BP: 112/77  Pulse: 71  Temp:   Resp: 18    Recent laboratory studies:  Lab Results  Component Value Date   HGB 15.4* 07/18/2014   HGB 14.9 04/27/2013   HGB 15.3* 02/23/2013   Lab Results  Component Value Date   WBC 6.7 07/18/2014   PLT 253 07/18/2014   Lab Results  Component Value Date   INR 0.99 07/18/2014   Lab Results  Component Value Date   NA 143 07/18/2014   K 4.0 07/18/2014   CL 105 07/18/2014   CO2 29 07/18/2014   BUN 16 07/18/2014   CREATININE 0.83 07/18/2014   GLUCOSE 74 07/18/2014    Discharge Medications:     Medication List    STOP taking these medications        HYDROcodone-acetaminophen 10-325 MG per tablet  Commonly known as:  NORCO     meloxicam 7.5 MG tablet  Commonly known as:  MOBIC      TAKE these medications        aspirin 81 MG tablet  Take 81 mg by mouth daily.     atenolol 25 MG tablet  Commonly known as:  TENORMIN  Take 1 tablet (25 mg total) by mouth 2 (two) times daily.     atorvastatin 10 MG tablet  Commonly known as:  LIPITOR  Take 1 tablet (10 mg total) by mouth daily.     baclofen 20 MG tablet  Commonly known as:  LIORESAL  Take 20 mg by mouth 3 (three) times daily.     CALCIUM + D PO  Take 1 tablet by mouth 2 (two) times daily. Calcium Magnesium Zinc and Vitamin D     celecoxib 100 MG capsule  Commonly known as:  CELEBREX  Take 1 capsule (100 mg total) by mouth 2 (two) times daily.     chlorhexidine 0.12 % solution  Commonly known as:  PERIDEX  Use as directed 15 mLs in the mouth or throat 2 (two) times daily.     clonazePAM 1 MG tablet  Commonly known as:  KLONOPIN  Take 1 mg by mouth at bedtime.     docusate sodium 100 MG capsule  Commonly known as:  COLACE  Take 1 capsule (100 mg total) by mouth 2 (two) times daily.     fish oil-omega-3 fatty acids 1000 MG capsule  Take 1 g by mouth 3 (three) times daily.     multivitamin with minerals  Tabs tablet  Take 1 tablet by mouth daily.     ondansetron 4 MG tablet  Commonly known as:  ZOFRAN  Take 1 tablet (4 mg total) by mouth every 8 (eight) hours as needed for nausea or vomiting.     oxyCODONE-acetaminophen 5-325 MG per tablet  Commonly known as:  PERCOCET  Take 1-2 tablets by mouth every 4 (four) hours as needed for severe pain.     sertraline 100 MG tablet  Commonly known as:  ZOLOFT  Take 100 mg by mouth daily.     verapamil 240 MG CR tablet  Commonly known as:  CALAN-SR  TAKE 1 TABLET AT BEDTIME.      ASK your doctor about these medications        apixaban 2.5 MG Tabs tablet  Commonly known as:  ELIQUIS  Take 1 tablet (2.5 mg total) by mouth 2 (two) times daily.  Ask about: Which instructions should I use?     apixaban 2.5 MG Tabs tablet  Commonly known as:  ELIQUIS  Take 1 tablet (2.5 mg total) by mouth 2 (two) times daily.  Ask about: Which instructions should I use?        Diagnostic Studies: Dg Pelvis Portable  07/31/2014   CLINICAL DATA:  Status post right hip arthroplasty  EXAM: PORTABLE PELVIS 1-2 VIEWS  COMPARISON:  None.  FINDINGS: Right hip replacement is now seen. No acute bony abnormality is noted. Degenerative changes of left hip joint are seen. The visualized portions of the pelvic ring are intact.  IMPRESSION: Status post right hip arthroplasty.   Electronically Signed   By: Inez Catalina M.D.   On: 07/31/2014 14:05    Disposition: 01-Home or Self Care      Discharge Instructions    Weight bearing as tolerated    Complete by:  As directed   Laterality:  right  Extremity:  Lower           Follow-up Information    Follow up with MURPHY, TIMOTHY D, MD In 1 week.   Specialty:  Orthopedic Surgery   Contact information:   [address] [place] [address] [phone]        Signed: Gae Dry 08/02/2014, 7:00 AM Cell [phone]

## 2014-08-02 NOTE — Progress Notes (Signed)
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  She has significant PMHx of anxiety, depression, PSVT, chronic back pain, COPD, cervical disc surgery, knee surgery, and multiple low back surgeries.    PT Comments    Pt progressing towards PT goals.  Pt demonstrated safe stair navigation for return home and supervision level function.  Current plan to return home with outpatient physical therapy remains appropriate per medical clearance and d/c.    Follow Up Recommendations  Outpatient PT     Equipment Recommendations  Rolling walker with 5" wheels    Recommendations for Other Services       Precautions / Restrictions Precautions Precautions: None Precaution Comments: Direct Anterior Approach Restrictions Weight Bearing Restrictions: Yes RLE Weight Bearing: Weight bearing as tolerated    Mobility  Bed Mobility               General bed mobility comments: Pt received sitting at EOB dressing herself   Transfers Overall transfer level: Modified independent Equipment used: Rolling walker (2 wheeled) Transfers: Sit to/from Stand Sit to Stand: Modified independent (Device/Increase time)         General transfer comment: Pt able to demonstrate safe transitions with use of hands for control.   Ambulation/Gait Ambulation/Gait assistance: Supervision Ambulation Distance (Feet): 1050 Feet Assistive device: Rolling walker (2 wheeled);Straight cane Gait Pattern/deviations: Step-through pattern     General Gait Details: Pt ambulated 550 with cane and transitioned back to RW.  Pt demonstrates good posture/mechanics with RW, but is more comfortable holding cane ipsilaterally, which creates a R lateral lean with ambulation.  Pt educated on benefits of holding cane contralterally, but feels  more safe with cane ipsilaterally   Stairs Stairs: Yes Stairs assistance: Supervision Stair Management: One rail Left;Forwards;With cane Number of Stairs: 10 General stair comments: Pt demonstrated safe stair navigation necessary for return home with use of L rail and cane   Wheelchair Mobility    Modified Rankin (Stroke Patients Only)       Balance Overall balance assessment: Needs assistance         Standing balance support: Single extremity supported;Bilateral upper extremity supported;During functional activity Standing balance-Leahy Scale: Good Standing balance comment: Pt not aware of effects of fatigue on balance, 1 episode of slight LOB during treatment due to lack of awareness of body position and mechanics                    Cognition Arousal/Alertness: Awake/alert Behavior During Therapy: WFL for tasks assessed/performed Overall Cognitive Status: Within Functional Limits for tasks assessed                      Exercises      General Comments        Pertinent Vitals/Pain Pain Assessment: 0-10 Pain Score: 3  Pain Location: R leg  Pain Descriptors / Indicators: Aching Pain Intervention(s): Monitored during session    Home Living                      Prior Function            PT Goals (current goals can now be found in the care plan section) Acute Rehab PT Goals Patient Stated Goal: Return home Progress towards PT goals: Progressing toward goals  Frequency  7X/week    PT Plan Current plan remains appropriate    Co-evaluation             End of Session   Activity Tolerance: Patient tolerated treatment well Patient left: in chair;with call bell/phone within reach     Time: 1000-1043 PT Time Calculation (min) (ACUTE ONLY): 43 min  Charges:                       G Codes:      Marquasha Brutus 2014-08-06, 11:03 AM  Vella Raring, SPT (student physical therapist) Office phone: [phone]

## 2014-08-03 DIAGNOSIS — Z471 Aftercare following joint replacement surgery: Secondary | ICD-10-CM | POA: Diagnosis not present

## 2014-08-03 DIAGNOSIS — M25551 Pain in right hip: Secondary | ICD-10-CM | POA: Diagnosis not present

## 2014-08-03 DIAGNOSIS — Z96641 Presence of right artificial hip joint: Secondary | ICD-10-CM | POA: Diagnosis not present

## 2014-08-05 DIAGNOSIS — M25551 Pain in right hip: Secondary | ICD-10-CM | POA: Diagnosis not present

## 2014-08-05 DIAGNOSIS — Z96641 Presence of right artificial hip joint: Secondary | ICD-10-CM | POA: Diagnosis not present

## 2014-08-05 DIAGNOSIS — Z471 Aftercare following joint replacement surgery: Secondary | ICD-10-CM | POA: Diagnosis not present

## 2014-08-06 ENCOUNTER — Telehealth: Payer: Self-pay | Admitting: Cardiovascular Disease

## 2014-08-06 NOTE — Telephone Encounter (Signed)
Pt called in stating that Dr. Claiborne Billings put her on Eliquis for 35 days and she would like some instructions on how to discontinue the medication after the time has expired. Please call pt back  Thanks

## 2014-08-06 NOTE — Telephone Encounter (Signed)
Spoke with pt, aware there are no special instructions for stopping, she can just stop. Patient voiced understanding

## 2014-08-07 DIAGNOSIS — M25551 Pain in right hip: Secondary | ICD-10-CM | POA: Diagnosis not present

## 2014-08-07 DIAGNOSIS — Z471 Aftercare following joint replacement surgery: Secondary | ICD-10-CM | POA: Diagnosis not present

## 2014-08-07 DIAGNOSIS — Z96641 Presence of right artificial hip joint: Secondary | ICD-10-CM | POA: Diagnosis not present

## 2014-08-09 DIAGNOSIS — Z471 Aftercare following joint replacement surgery: Secondary | ICD-10-CM | POA: Diagnosis not present

## 2014-08-09 DIAGNOSIS — Z96641 Presence of right artificial hip joint: Secondary | ICD-10-CM | POA: Diagnosis not present

## 2014-08-09 DIAGNOSIS — M25551 Pain in right hip: Secondary | ICD-10-CM | POA: Diagnosis not present

## 2014-08-10 DIAGNOSIS — M1611 Unilateral primary osteoarthritis, right hip: Secondary | ICD-10-CM | POA: Diagnosis not present

## 2014-08-14 DIAGNOSIS — Z96641 Presence of right artificial hip joint: Secondary | ICD-10-CM | POA: Diagnosis not present

## 2014-08-14 DIAGNOSIS — Z471 Aftercare following joint replacement surgery: Secondary | ICD-10-CM | POA: Diagnosis not present

## 2014-08-14 DIAGNOSIS — M25551 Pain in right hip: Secondary | ICD-10-CM | POA: Diagnosis not present

## 2014-08-17 DIAGNOSIS — M179 Osteoarthritis of knee, unspecified: Secondary | ICD-10-CM | POA: Diagnosis not present

## 2014-08-17 DIAGNOSIS — M25551 Pain in right hip: Secondary | ICD-10-CM | POA: Diagnosis not present

## 2014-08-17 DIAGNOSIS — M25469 Effusion, unspecified knee: Secondary | ICD-10-CM | POA: Diagnosis not present

## 2014-08-17 DIAGNOSIS — M25462 Effusion, left knee: Secondary | ICD-10-CM | POA: Diagnosis not present

## 2014-08-20 DIAGNOSIS — Z96641 Presence of right artificial hip joint: Secondary | ICD-10-CM | POA: Diagnosis not present

## 2014-08-27 DIAGNOSIS — Z96641 Presence of right artificial hip joint: Secondary | ICD-10-CM | POA: Diagnosis not present

## 2014-09-06 DIAGNOSIS — M47816 Spondylosis without myelopathy or radiculopathy, lumbar region: Secondary | ICD-10-CM | POA: Diagnosis not present

## 2014-09-06 DIAGNOSIS — G894 Chronic pain syndrome: Secondary | ICD-10-CM | POA: Diagnosis not present

## 2014-09-06 DIAGNOSIS — Z79891 Long term (current) use of opiate analgesic: Secondary | ICD-10-CM | POA: Diagnosis not present

## 2014-09-06 DIAGNOSIS — M961 Postlaminectomy syndrome, not elsewhere classified: Secondary | ICD-10-CM | POA: Diagnosis not present

## 2014-09-10 DIAGNOSIS — Z96641 Presence of right artificial hip joint: Secondary | ICD-10-CM | POA: Diagnosis not present

## 2014-10-22 DIAGNOSIS — Z96641 Presence of right artificial hip joint: Secondary | ICD-10-CM | POA: Diagnosis not present

## 2014-11-01 DIAGNOSIS — M546 Pain in thoracic spine: Secondary | ICD-10-CM | POA: Diagnosis not present

## 2014-11-01 DIAGNOSIS — M47816 Spondylosis without myelopathy or radiculopathy, lumbar region: Secondary | ICD-10-CM | POA: Diagnosis not present

## 2014-11-01 DIAGNOSIS — M961 Postlaminectomy syndrome, not elsewhere classified: Secondary | ICD-10-CM | POA: Diagnosis not present

## 2014-11-01 DIAGNOSIS — G894 Chronic pain syndrome: Secondary | ICD-10-CM | POA: Diagnosis not present

## 2014-11-20 DIAGNOSIS — J449 Chronic obstructive pulmonary disease, unspecified: Secondary | ICD-10-CM | POA: Diagnosis not present

## 2014-11-20 DIAGNOSIS — M1990 Unspecified osteoarthritis, unspecified site: Secondary | ICD-10-CM | POA: Diagnosis not present

## 2014-11-20 DIAGNOSIS — R197 Diarrhea, unspecified: Secondary | ICD-10-CM | POA: Diagnosis not present

## 2014-11-20 DIAGNOSIS — I471 Supraventricular tachycardia: Secondary | ICD-10-CM | POA: Diagnosis not present

## 2014-11-22 DIAGNOSIS — R1032 Left lower quadrant pain: Secondary | ICD-10-CM | POA: Diagnosis not present

## 2014-11-22 DIAGNOSIS — R197 Diarrhea, unspecified: Secondary | ICD-10-CM | POA: Diagnosis not present

## 2014-11-22 DIAGNOSIS — K5900 Constipation, unspecified: Secondary | ICD-10-CM | POA: Diagnosis not present

## 2014-11-27 DIAGNOSIS — Z124 Encounter for screening for malignant neoplasm of cervix: Secondary | ICD-10-CM | POA: Diagnosis not present

## 2014-11-27 DIAGNOSIS — Z01419 Encounter for gynecological examination (general) (routine) without abnormal findings: Secondary | ICD-10-CM | POA: Diagnosis not present

## 2014-11-27 DIAGNOSIS — Z1231 Encounter for screening mammogram for malignant neoplasm of breast: Secondary | ICD-10-CM | POA: Diagnosis not present

## 2014-12-03 DIAGNOSIS — R197 Diarrhea, unspecified: Secondary | ICD-10-CM | POA: Diagnosis not present

## 2015-01-01 DIAGNOSIS — R1032 Left lower quadrant pain: Secondary | ICD-10-CM | POA: Diagnosis not present

## 2015-01-01 DIAGNOSIS — R197 Diarrhea, unspecified: Secondary | ICD-10-CM | POA: Diagnosis not present

## 2015-01-18 DIAGNOSIS — Z23 Encounter for immunization: Secondary | ICD-10-CM | POA: Diagnosis not present

## 2015-01-24 DIAGNOSIS — M546 Pain in thoracic spine: Secondary | ICD-10-CM | POA: Diagnosis not present

## 2015-01-24 DIAGNOSIS — M961 Postlaminectomy syndrome, not elsewhere classified: Secondary | ICD-10-CM | POA: Diagnosis not present

## 2015-01-24 DIAGNOSIS — G894 Chronic pain syndrome: Secondary | ICD-10-CM | POA: Diagnosis not present

## 2015-01-24 DIAGNOSIS — M47816 Spondylosis without myelopathy or radiculopathy, lumbar region: Secondary | ICD-10-CM | POA: Diagnosis not present

## 2015-02-06 DIAGNOSIS — R197 Diarrhea, unspecified: Secondary | ICD-10-CM | POA: Diagnosis not present

## 2015-02-15 DIAGNOSIS — J069 Acute upper respiratory infection, unspecified: Secondary | ICD-10-CM | POA: Diagnosis not present

## 2015-02-15 DIAGNOSIS — R0602 Shortness of breath: Secondary | ICD-10-CM | POA: Diagnosis not present

## 2015-02-15 DIAGNOSIS — N390 Urinary tract infection, site not specified: Secondary | ICD-10-CM | POA: Diagnosis not present

## 2015-02-15 DIAGNOSIS — N3000 Acute cystitis without hematuria: Secondary | ICD-10-CM | POA: Diagnosis not present

## 2015-02-25 DIAGNOSIS — J209 Acute bronchitis, unspecified: Secondary | ICD-10-CM | POA: Diagnosis not present

## 2015-02-25 DIAGNOSIS — N390 Urinary tract infection, site not specified: Secondary | ICD-10-CM | POA: Diagnosis not present

## 2015-02-25 DIAGNOSIS — I471 Supraventricular tachycardia: Secondary | ICD-10-CM | POA: Diagnosis not present

## 2015-02-25 DIAGNOSIS — K589 Irritable bowel syndrome without diarrhea: Secondary | ICD-10-CM | POA: Diagnosis not present

## 2015-02-25 DIAGNOSIS — J449 Chronic obstructive pulmonary disease, unspecified: Secondary | ICD-10-CM | POA: Diagnosis not present

## 2015-02-28 ENCOUNTER — Ambulatory Visit (INDEPENDENT_AMBULATORY_CARE_PROVIDER_SITE_OTHER): Payer: Medicare Other | Admitting: Cardiovascular Disease

## 2015-02-28 VITALS — BP 88/54 | HR 62 | Ht 68.0 in | Wt 121.0 lb

## 2015-02-28 DIAGNOSIS — I440 Atrioventricular block, first degree: Secondary | ICD-10-CM

## 2015-02-28 DIAGNOSIS — E785 Hyperlipidemia, unspecified: Secondary | ICD-10-CM

## 2015-02-28 DIAGNOSIS — I471 Supraventricular tachycardia: Secondary | ICD-10-CM | POA: Diagnosis not present

## 2015-02-28 DIAGNOSIS — Z79899 Other long term (current) drug therapy: Secondary | ICD-10-CM

## 2015-02-28 MED ORDER — ATENOLOL 25 MG PO TABS: ORAL_TABLET | ORAL | Status: DC

## 2015-02-28 MED ORDER — VERAPAMIL HCL ER 240 MG PO TBCR: EXTENDED_RELEASE_TABLET | ORAL | Status: DC

## 2015-02-28 MED ORDER — ATORVASTATIN CALCIUM 10 MG PO TABS: 10.0000 mg | ORAL_TABLET | Freq: Every day | ORAL | Status: DC

## 2015-02-28 NOTE — Patient Instructions (Addendum)
Your physician recommends that you return for lab work FASTING.  Your physician has recommended you make the following change in your medication: THE ATENOLOL HAS BEEN CHANGED TO 1 TABLET IN THE MORNING AND 1/2 TABLET IN THE PM.  Your physician wants you to follow-up in: 1 YEAR OR SOONER IF NEEDED. You will receive a reminder letter in the mail two months in advance. If you don't receive a letter, please call our office to schedule the follow-up appointment.

## 2015-03-01 LAB — COMPREHENSIVE METABOLIC PANEL
ALBUMIN: 3.9 g/dL (ref 3.6–5.1)
ALK PHOS: 74 U/L (ref 33–130)
ALT: 32 U/L — AB (ref 6–29)
AST: 41 U/L — AB (ref 10–35)
BUN: 16 mg/dL (ref 7–25)
CO2: 27 mmol/L (ref 20–31)
Calcium: 9 mg/dL (ref 8.6–10.4)
Chloride: 103 mmol/L (ref 98–110)
Creat: 0.8 mg/dL (ref 0.50–0.99)
GLUCOSE: 83 mg/dL (ref 65–99)
Potassium: 4.5 mmol/L (ref 3.5–5.3)
SODIUM: 141 mmol/L (ref 135–146)
Total Bilirubin: 0.7 mg/dL (ref 0.2–1.2)
Total Protein: 6.1 g/dL (ref 6.1–8.1)

## 2015-03-01 LAB — TSH: TSH: 1.176 u[IU]/mL (ref 0.350–4.500)

## 2015-03-01 LAB — LIPID PANEL
Cholesterol: 155 mg/dL (ref 125–200)
HDL: 47 mg/dL (ref >=46)
LDL Cholesterol: 78 mg/dL (ref <130)
TRIGLYCERIDES: 150 mg/dL — AB (ref <150)
Total CHOL/HDL Ratio: 3.3 Ratio (ref <=5.0)
VLDL: 30 mg/dL (ref <30)

## 2015-03-01 LAB — CBC
HCT: 44.9 % (ref 36.0–46.0)
HEMOGLOBIN: 14.9 g/dL (ref 12.0–15.0)
MCH: 33.8 pg (ref 26.0–34.0)
MCHC: 33.2 g/dL (ref 30.0–36.0)
MCV: 101.8 fL — ABNORMAL HIGH (ref 78.0–100.0)
MPV: 10.5 fL (ref 8.6–12.4)
Platelets: 190 10*3/uL (ref 150–400)
RBC: 4.41 MIL/uL (ref 3.87–5.11)
RDW: 13 % (ref 11.5–15.5)
WBC: 4.7 10*3/uL (ref 4.0–10.5)

## 2015-03-02 ENCOUNTER — Encounter: Payer: Self-pay | Admitting: Cardiovascular Disease

## 2015-03-02 DIAGNOSIS — I440 Atrioventricular block, first degree: Secondary | ICD-10-CM | POA: Insufficient documentation

## 2015-03-02 NOTE — Progress Notes (Signed)
Patient ID: [name], female   DOB: 1950-11-17, 64 y.o.   MRN: [id number]     HPI: [name] is a 64 y.o. female resents to the office today for a one year followup cardiology evaluation.   [name] has a history of supraventricular tachycardia which has been fairly well-controlled on combination verapamil SR 240 mg plus beta blocker therapy with atenolol 25 mg twice a day. She has a history of underlying mitral valve prolapse, mild carotid disease, hyperlipidemia, as well as mild COPD. She had smoked for over 30 years but quit smoking approximately 7 years ago.  Three years ago she was hospitalized after being bitten by a copperhead snake and required 5 days in the hospital. Apparently her cardiac medicines were held at that time and during the hospitalization she had recurrent episodes of tachycardia dysrhythmia. As long as she takes her medicines, and she feels that her heart rhythm is controlled. There is rare intermittent palpitations.typically, these episodes may last for under a minute and typically resolve with the Valsalva maneuver if they don't resolve spontaneously.  Since I last saw her, she is unaware of any breakthrough arrhythmias.  She continues to exercise regularly and walks typically 2 miles per day and also does water aerobics several days per week. She denies any orthostatic symptoms.  She denies presyncope or syncope.  Past Medical History  Diagnosis Date  . Anxiety   . Arthritis   . Depression   . PSVT (paroxysmal supraventricular tachycardia) ([place])   . Heart murmur   . Back pain, chronic   . Dysrhythmia     hx svt  . COPD (chronic obstructive pulmonary disease) ([facility])     emphysema no meds  . Complication of anesthesia     per patient "difficult stick- needs central line or picc"  . PONV (postoperative nausea and vomiting)     nausea occ    Past Surgical History  Procedure Laterality Date  . Tubal ligation    . Cervical disc surgery    .  Colectomy    . Knee surgery      bone spur  64 yrs old  . Tonsillectomy    . Spine surgery  1983-01/2007  . Breast surgery  1976    breast reduction  . Back surgery      14 back surgery ,neck and lumbar fused  . Total hip arthroplasty Right 07/31/2014    Procedure: TOTAL HIP ARTHROPLASTY ANTERIOR APPROACH;  Surgeon: Renette Butters, MD;  Location: [facility];  Service: Orthopedics;  Laterality: Right;    Allergies  Allergen Reactions  . Bupropion Hcl Swelling  . Morphine Itching    Current Outpatient Prescriptions  Medication Sig Dispense Refill  . aspirin 81 MG tablet Take 81 mg by mouth daily.    Marland Kitchen atenolol (TENORMIN) 25 MG tablet Take 1 tablet  In the morning and 1/2 in the PM 135 tablet 3  . atorvastatin (LIPITOR) 10 MG tablet Take 1 tablet (10 mg total) by mouth daily. 90 tablet 3  . baclofen (LIORESAL) 20 MG tablet Take 20 mg by mouth 3 (three) times daily.     . Calcium Carbonate-Vitamin D (CALCIUM + D PO) Take 1 tablet by mouth 2 (two) times daily. Calcium Magnesium Zinc and Vitamin D    . chlorhexidine (PERIDEX) 0.12 % solution Use as directed 15 mLs in the mouth or throat 2 (two) times daily.     . clonazePAM (KLONOPIN) 1 MG tablet Take 1  mg by mouth at bedtime.    . docusate sodium (COLACE) 100 MG capsule Take 1 capsule (100 mg total) by mouth 2 (two) times daily. 10 capsule 0  . fish oil-omega-3 fatty acids 1000 MG capsule Take 1 g by mouth 3 (three) times daily.     Marland Kitchen HYDROcodone-acetaminophen (VICODIN) 2.5-500 MG tablet Take 1 tablet by mouth every 6 (six) hours as needed for pain.    . Multiple Vitamin (MULTIVITAMIN WITH MINERALS) TABS tablet Take 1 tablet by mouth daily.    . ondansetron (ZOFRAN) 4 MG tablet Take 1 tablet (4 mg total) by mouth every 8 (eight) hours as needed for nausea or vomiting. 20 tablet 0  . sertraline (ZOLOFT) 100 MG tablet Take 100 mg by mouth daily.    . verapamil (CALAN-SR) 240 MG CR tablet TAKE 1 TABLET AT BEDTIME. 90 tablet 3   No current  facility-administered medications for this visit.    Social History   Social History  . Marital Status: Married    Spouse Name: N/A  . Number of Children: N/A  . Years of Education: N/A   Occupational History  . Not on file.   Social History Main Topics  . Smoking status: Former Smoker    Quit date: 05/06/2008  . Smokeless tobacco: Former Systems developer  . Alcohol Use: 0.0 oz/week    5-6 Glasses of wine per week     Comment: occasional  . Drug Use: No  . Sexual Activity: Yes   Other Topics Concern  . Not on file   Social History Narrative   Social history is notable in that she is married. She has one stepchild and 2 stepgrandchildren. She does walk. She has no recent tobacco use or alcohol use  Family History  Problem Relation Age of Onset  . COPD Father   . Diabetes Mother   . Heart disease Mother   . Kidney disease Mother   . Hypertension Mother     ROS General: Negative; No fevers, chills, or night sweats;  HEENT: Negative; No changes in vision or hearing, sinus congestion, difficulty swallowing Pulmonary: positive for mild COPD; No cough, wheezing, shortness of breath, hemoptysis Cardiovascular: Negative; No chest pain, presyncope, syncope, palpitations GI: Negative; No nausea, vomiting, diarrhea, or abdominal pain GU: Negative; No dysuria, hematuria, or difficulty voiding Musculoskeletal: Negative; no myalgias, joint pain, or weakness Hematologic/Oncology: Negative; no easy bruising, bleeding Endocrine: Negative; no heat/cold intolerance; no diabetes Neuro: Negative; no changes in balance, headaches Skin: Negative; No rashes or skin lesions Psychiatric: positive for mild depression for which she takes Zoloft.; No behavioral problems,  Sleep: Negative; No snoring, daytime sleepiness, hypersomnolence, bruxism, restless legs, hypnogognic hallucinations, no cataplexy Other comprehensive 14 point system review is negative.   PE BP 88/54 mmHg  Pulse 62  Ht 5' 8"  (1.727 m)  Wt 121 lb (54.885 kg)  BMI 18.40 kg/m2   Repeat blood pressure by me was 104/60 supine and 94/60 standing.  Wt Readings from Last 3 Encounters:  02/28/15 121 lb (54.885 kg)  07/31/14 121 lb 8 oz (55.112 kg)  07/18/14 121 lb 8.6 oz (55.13 kg)   General: Alert, oriented, no distress.  Skin: normal turgor, no rashes HEENT: Normocephalic, atraumatic. Pupils round and reactive; sclera anicteric;no lid lag.  Nose without nasal septal hypertrophy Mouth/Parynx benign; Mallinpatti scale 2 Neck: No JVD, no carotid bruits with normal carotid upstroke Lungs: clear to ausculatation and percussion; no wheezing or rales Chest wall: Nontender to palpation Heart: RRR, s1  s2 normal 1/6 systolic murmur with an intermittent systolic click consistent with her mitral valve prolapse.Marland Kitchen  No S3 gallop.  No diastolic murmur, rubs thrills or heaves. Abdomen: soft, nontender; no hepatosplenomehaly, BS+; abdominal aorta nontender and not dilated by palpation. Back: No CVA tenderness Pulses 2+ Extremities: no clubbing cyanosis or edema, Homan's sign negative  Neurologic: grossly nonfocal Psychologic: normal affect and mood.   ECG (independently read by me): normal sinus rhythm with first-degree heart block with a PR interval at 270 ms.  QRS complex V1 V2.  November 2015ECG (independently read by me.  (: Normal sinus rhythm at 64 bpm.  Borderline first degree AV block with a PR interval at 204 ms.  Mild RV conduction delay.  Anteroseptal Q waves, unchanged  October 2014ECG: Sinus rhythm with incomplete right bundle branch block. Anteroseptal Q waves unchanged.  LABS:  BMP Latest Ref Rng 02/28/2015 07/18/2014 04/27/2013  Glucose 65 - 99 mg/dL 83 74 79  BUN 7 - 25 mg/dL _0 Creatinine 0.50 - 0.99 mg/dL 0.80 0.83 0.63  Sodium 135 - 146 mmol/L 141 143 145  Potassium 3.5 - 5.3 mmol/L 4.5 4.0 4.0  Chloride 98 - 110 mmol/L 103 105 106  CO2 20 - 31 mmol/L _1 Calcium 8.6 - 10.4 mg/dL 9.0 10.4  9.7   Hepatic Function Latest Ref Rng 02/28/2015 02/23/2013 01/23/2011  Total Protein 6.1 - 8.1 g/dL 6.1 7.1 5.9(L)  Albumin 3.6 - 5.1 g/dL 3.9 4.8 2.5(L)  AST 10 - 35 U/L 41(H) 28 24  ALT 6 - 29 U/L 32(H) 23 31  Alk Phosphatase 33 - 130 U/L 74 72 84  Total Bilirubin 0.2 - 1.2 mg/dL 0.7 0.7 0.2(L)  Bilirubin, Direct 0.0 - 0.3 mg/dL - - -   CBC Latest Ref Rng 02/28/2015 07/18/2014 04/27/2013  WBC 4.0 - 10.5 K/uL 4.7 6.7 6.7  Hemoglobin 12.0 - 15.0 g/dL 14.9 15.4(H) 14.9  Hematocrit 36.0 - 46.0 % 44.9 47.4(H) 43.4  Platelets 150 - 400 K/uL 190 253 181   Lab Results  Component Value Date   MCV 101.8* 02/28/2015   MCV 104.2* 07/18/2014   MCV 100.2* 04/27/2013   Lab Results  Component Value Date   TSH 1.176 02/28/2015   Lipid Panel     Component Value Date/Time   CHOL 155 02/28/2015 1023   TRIG 150* 02/28/2015 1023   HDL 47 02/28/2015 1023   CHOLHDL 3.3 02/28/2015 1023   VLDL 30 02/28/2015 1023   LDLCALC 78 02/28/2015 1023     RADIOLOGY: No results found.    ASSESSMENT AND PLAN: [name] is a 64 year old female who has a history of SVTwhich has been fairly well-controlled on verapamil 240 mg as well as atenolol 25 twice a day. In the past, when she just try to reduce her dose or skips a dose intermittently she has noticed recurrent palpitations. Her last echo Doppler study in 2011 showed normal systolic function and borderline mitral valve prolapse with trace MR. A nuclear study showed normal perfusion. Her blood pressure today is mildly low, but she remains asymptomatic.  There is no significant orthostatic drop.  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  For this reason, I am reducing her atenolol from 25 mg twice a day to 25 mg in the morning and 12.5 mg at night.  I am recommending a complete set of fasting laboratory be obtained.  I will contact her regarding the results.  She will notify us if she develops  recurrent palpitations with a reduced beta blocker regimen.  She will  continue taking the current dose of verapamil 240 mg at bedtime.she also is on Zoloft 100 mg for depression.  She is on combination atorvastatin 10 mg and omega-3 fatty acid for hyperlipidemia.  I will contact her regarding the results of the laboratory which was done today.  Time spent: 25 minutes Troy Sine, MD, [facility]  03/02/2015 8:40 AM

## 2015-03-05 ENCOUNTER — Encounter: Payer: Self-pay | Admitting: Cardiovascular Disease

## 2015-03-05 NOTE — Addendum Note (Signed)
Addended by: Therisa Doyne on: 03/05/2015 03:56 PM   Modules accepted: Orders

## 2015-03-18 DIAGNOSIS — L601 Onycholysis: Secondary | ICD-10-CM | POA: Diagnosis not present

## 2015-03-18 DIAGNOSIS — L720 Epidermal cyst: Secondary | ICD-10-CM | POA: Diagnosis not present

## 2015-03-25 ENCOUNTER — Other Ambulatory Visit: Payer: Self-pay | Admitting: *Deleted

## 2015-03-25 ENCOUNTER — Telehealth: Payer: Self-pay | Admitting: *Deleted

## 2015-03-25 DIAGNOSIS — R7989 Other specified abnormal findings of blood chemistry: Secondary | ICD-10-CM

## 2015-03-25 DIAGNOSIS — R945 Abnormal results of liver function studies: Principal | ICD-10-CM

## 2015-03-25 NOTE — Telephone Encounter (Signed)
-----   Message from Troy Sine, MD sent at 03/24/2015  3:31 PM EST ----- Labs good x min inc AST/ALT continue RX; f/u LFT's in 2 months

## 2015-03-25 NOTE — Telephone Encounter (Signed)
Patient notified of lab results and recommendations. She voiced understanding. She will have her labs drawn in [place].

## 2015-04-03 DIAGNOSIS — M7989 Other specified soft tissue disorders: Secondary | ICD-10-CM | POA: Diagnosis not present

## 2015-04-03 DIAGNOSIS — J449 Chronic obstructive pulmonary disease, unspecified: Secondary | ICD-10-CM | POA: Diagnosis not present

## 2015-04-03 DIAGNOSIS — M79605 Pain in left leg: Secondary | ICD-10-CM | POA: Diagnosis not present

## 2015-04-03 DIAGNOSIS — M79662 Pain in left lower leg: Secondary | ICD-10-CM | POA: Diagnosis not present

## 2015-04-03 DIAGNOSIS — I471 Supraventricular tachycardia: Secondary | ICD-10-CM | POA: Diagnosis not present

## 2015-04-18 DIAGNOSIS — M47816 Spondylosis without myelopathy or radiculopathy, lumbar region: Secondary | ICD-10-CM | POA: Diagnosis not present

## 2015-04-18 DIAGNOSIS — M546 Pain in thoracic spine: Secondary | ICD-10-CM | POA: Diagnosis not present

## 2015-04-18 DIAGNOSIS — M961 Postlaminectomy syndrome, not elsewhere classified: Secondary | ICD-10-CM | POA: Diagnosis not present

## 2015-04-18 DIAGNOSIS — G894 Chronic pain syndrome: Secondary | ICD-10-CM | POA: Diagnosis not present

## 2015-06-26 DIAGNOSIS — M1712 Unilateral primary osteoarthritis, left knee: Secondary | ICD-10-CM | POA: Diagnosis not present

## 2015-06-26 DIAGNOSIS — M7062 Trochanteric bursitis, left hip: Secondary | ICD-10-CM | POA: Diagnosis not present

## 2015-07-10 DIAGNOSIS — D3131 Benign neoplasm of right choroid: Secondary | ICD-10-CM | POA: Diagnosis not present

## 2015-07-10 DIAGNOSIS — H524 Presbyopia: Secondary | ICD-10-CM | POA: Diagnosis not present

## 2015-07-10 DIAGNOSIS — H532 Diplopia: Secondary | ICD-10-CM | POA: Diagnosis not present

## 2015-07-10 DIAGNOSIS — H2513 Age-related nuclear cataract, bilateral: Secondary | ICD-10-CM | POA: Diagnosis not present

## 2015-07-11 DIAGNOSIS — G894 Chronic pain syndrome: Secondary | ICD-10-CM | POA: Diagnosis not present

## 2015-07-11 DIAGNOSIS — M546 Pain in thoracic spine: Secondary | ICD-10-CM | POA: Diagnosis not present

## 2015-07-11 DIAGNOSIS — M47816 Spondylosis without myelopathy or radiculopathy, lumbar region: Secondary | ICD-10-CM | POA: Diagnosis not present

## 2015-07-11 DIAGNOSIS — M961 Postlaminectomy syndrome, not elsewhere classified: Secondary | ICD-10-CM | POA: Diagnosis not present

## 2015-07-19 DIAGNOSIS — M25551 Pain in right hip: Secondary | ICD-10-CM | POA: Diagnosis not present

## 2015-07-19 DIAGNOSIS — M160 Bilateral primary osteoarthritis of hip: Secondary | ICD-10-CM | POA: Diagnosis not present

## 2015-09-02 ENCOUNTER — Emergency Department (HOSPITAL_COMMUNITY): Payer: Medicare Other

## 2015-09-02 ENCOUNTER — Observation Stay (HOSPITAL_COMMUNITY)
Admission: EM | Admit: 2015-09-02 | Discharge: 2015-09-03 | Disposition: A | Discharge status: Home / Self Care | Payer: Medicare Other | Attending: Internal Medicine | Admitting: Internal Medicine

## 2015-09-02 ENCOUNTER — Encounter (HOSPITAL_COMMUNITY): Payer: Self-pay | Admitting: Emergency Medicine

## 2015-09-02 DIAGNOSIS — Z96641 Presence of right artificial hip joint: Secondary | ICD-10-CM | POA: Insufficient documentation

## 2015-09-02 DIAGNOSIS — Z79891 Long term (current) use of opiate analgesic: Secondary | ICD-10-CM | POA: Insufficient documentation

## 2015-09-02 DIAGNOSIS — J439 Emphysema, unspecified: Secondary | ICD-10-CM | POA: Diagnosis not present

## 2015-09-02 DIAGNOSIS — Z79899 Other long term (current) drug therapy: Secondary | ICD-10-CM | POA: Insufficient documentation

## 2015-09-02 DIAGNOSIS — F10921 Alcohol use, unspecified with intoxication delirium: Secondary | ICD-10-CM

## 2015-09-02 DIAGNOSIS — T1490 Injury, unspecified: Secondary | ICD-10-CM | POA: Diagnosis not present

## 2015-09-02 DIAGNOSIS — R0682 Tachypnea, not elsewhere classified: Secondary | ICD-10-CM | POA: Diagnosis not present

## 2015-09-02 DIAGNOSIS — R4781 Slurred speech: Secondary | ICD-10-CM | POA: Diagnosis not present

## 2015-09-02 DIAGNOSIS — G8929 Other chronic pain: Secondary | ICD-10-CM | POA: Insufficient documentation

## 2015-09-02 DIAGNOSIS — G459 Transient cerebral ischemic attack, unspecified: Secondary | ICD-10-CM | POA: Diagnosis present

## 2015-09-02 DIAGNOSIS — I471 Supraventricular tachycardia: Secondary | ICD-10-CM | POA: Insufficient documentation

## 2015-09-02 DIAGNOSIS — Z9049 Acquired absence of other specified parts of digestive tract: Secondary | ICD-10-CM | POA: Diagnosis not present

## 2015-09-02 DIAGNOSIS — J449 Chronic obstructive pulmonary disease, unspecified: Secondary | ICD-10-CM | POA: Diagnosis present

## 2015-09-02 DIAGNOSIS — G454 Transient global amnesia: Secondary | ICD-10-CM | POA: Diagnosis not present

## 2015-09-02 DIAGNOSIS — R410 Disorientation, unspecified: Secondary | ICD-10-CM | POA: Diagnosis present

## 2015-09-02 DIAGNOSIS — W19XXXA Unspecified fall, initial encounter: Secondary | ICD-10-CM | POA: Insufficient documentation

## 2015-09-02 DIAGNOSIS — F10129 Alcohol abuse with intoxication, unspecified: Secondary | ICD-10-CM | POA: Diagnosis not present

## 2015-09-02 DIAGNOSIS — E876 Hypokalemia: Secondary | ICD-10-CM | POA: Diagnosis present

## 2015-09-02 DIAGNOSIS — Y908 Blood alcohol level of 240 mg/100 ml or more: Secondary | ICD-10-CM | POA: Diagnosis not present

## 2015-09-02 DIAGNOSIS — F329 Major depressive disorder, single episode, unspecified: Secondary | ICD-10-CM | POA: Insufficient documentation

## 2015-09-02 DIAGNOSIS — Z7982 Long term (current) use of aspirin: Secondary | ICD-10-CM | POA: Diagnosis not present

## 2015-09-02 DIAGNOSIS — M1990 Unspecified osteoarthritis, unspecified site: Secondary | ICD-10-CM | POA: Diagnosis not present

## 2015-09-02 DIAGNOSIS — F10929 Alcohol use, unspecified with intoxication, unspecified: Secondary | ICD-10-CM | POA: Diagnosis present

## 2015-09-02 DIAGNOSIS — S0990XA Unspecified injury of head, initial encounter: Secondary | ICD-10-CM | POA: Diagnosis not present

## 2015-09-02 DIAGNOSIS — M545 Low back pain: Secondary | ICD-10-CM | POA: Diagnosis not present

## 2015-09-02 DIAGNOSIS — G9340 Encephalopathy, unspecified: Secondary | ICD-10-CM | POA: Diagnosis present

## 2015-09-02 DIAGNOSIS — R4182 Altered mental status, unspecified: Secondary | ICD-10-CM

## 2015-09-02 DIAGNOSIS — Z87891 Personal history of nicotine dependence: Secondary | ICD-10-CM

## 2015-09-02 DIAGNOSIS — R51 Headache: Secondary | ICD-10-CM | POA: Diagnosis not present

## 2015-09-02 DIAGNOSIS — F10121 Alcohol abuse with intoxication delirium: Secondary | ICD-10-CM | POA: Insufficient documentation

## 2015-09-02 LAB — I-STAT CHEM 8, ED
BUN: 12 mg/dL (ref 6–20)
CHLORIDE: 105 mmol/L (ref 101–111)
Calcium, Ion: 1.08 mmol/L — ABNORMAL LOW (ref 1.13–1.30)
Creatinine, Ser: 1.4 mg/dL — ABNORMAL HIGH (ref 0.44–1.00)
Glucose, Bld: 141 mg/dL — ABNORMAL HIGH (ref 65–99)
HCT: 45 % (ref 36.0–46.0)
Hemoglobin: 15.3 g/dL — ABNORMAL HIGH (ref 12.0–15.0)
Potassium: 3 mmol/L — ABNORMAL LOW (ref 3.5–5.1)
SODIUM: 145 mmol/L (ref 135–145)
TCO2: 24 mmol/L (ref 0–100)

## 2015-09-02 LAB — DIFFERENTIAL
BASOS PCT: 0 %
Basophils Absolute: 0 10*3/uL (ref 0.0–0.1)
Eosinophils Absolute: 0.1 10*3/uL (ref 0.0–0.7)
Eosinophils Relative: 2 %
Lymphocytes Relative: 45 %
Lymphs Abs: 2.7 10*3/uL (ref 0.7–4.0)
Monocytes Absolute: 0.5 10*3/uL (ref 0.1–1.0)
Monocytes Relative: 8 %
NEUTROS ABS: 2.8 10*3/uL (ref 1.7–7.7)
Neutrophils Relative %: 45 %

## 2015-09-02 LAB — COMPREHENSIVE METABOLIC PANEL
ALT: 27 U/L (ref 14–54)
ANION GAP: 14 (ref 5–15)
AST: 31 U/L (ref 15–41)
Albumin: 3.7 g/dL (ref 3.5–5.0)
Alkaline Phosphatase: 71 U/L (ref 38–126)
BUN: 10 mg/dL (ref 6–20)
CO2: 24 mmol/L (ref 22–32)
CREATININE: 0.92 mg/dL (ref 0.44–1.00)
Calcium: 9.4 mg/dL (ref 8.9–10.3)
Chloride: 107 mmol/L (ref 101–111)
Glucose, Bld: 147 mg/dL — ABNORMAL HIGH (ref 65–99)
Potassium: 3.1 mmol/L — ABNORMAL LOW (ref 3.5–5.1)
SODIUM: 145 mmol/L (ref 135–145)
Total Bilirubin: 0.4 mg/dL (ref 0.3–1.2)
Total Protein: 5.9 g/dL — ABNORMAL LOW (ref 6.5–8.1)

## 2015-09-02 LAB — URINALYSIS, ROUTINE W REFLEX MICROSCOPIC
Bilirubin Urine: NEGATIVE
GLUCOSE, UA: NEGATIVE mg/dL
Hgb urine dipstick: NEGATIVE
Ketones, ur: NEGATIVE mg/dL
LEUKOCYTES UA: NEGATIVE
Nitrite: NEGATIVE
PROTEIN: NEGATIVE mg/dL
SPECIFIC GRAVITY, URINE: 1.009 (ref 1.005–1.030)
pH: 6 (ref 5.0–8.0)

## 2015-09-02 LAB — CBC
HCT: 43.9 % (ref 36.0–46.0)
Hemoglobin: 14.3 g/dL (ref 12.0–15.0)
MCH: 34 pg (ref 26.0–34.0)
MCHC: 32.6 g/dL (ref 30.0–36.0)
MCV: 104.3 fL — ABNORMAL HIGH (ref 78.0–100.0)
PLATELETS: 217 10*3/uL (ref 150–400)
RBC: 4.21 MIL/uL (ref 3.87–5.11)
RDW: 13.4 % (ref 11.5–15.5)
WBC: 6.1 10*3/uL (ref 4.0–10.5)

## 2015-09-02 LAB — PROTIME-INR
INR: 0.89 (ref 0.00–1.49)
PROTHROMBIN TIME: 12.3 s (ref 11.6–15.2)

## 2015-09-02 LAB — RAPID URINE DRUG SCREEN, HOSP PERFORMED
Amphetamines: NOT DETECTED
BARBITURATES: NOT DETECTED
Benzodiazepines: NOT DETECTED
COCAINE: NOT DETECTED
Opiates: NOT DETECTED
Tetrahydrocannabinol: NOT DETECTED

## 2015-09-02 LAB — APTT: aPTT: 29 seconds (ref 24–37)

## 2015-09-02 LAB — I-STAT TROPONIN, ED: TROPONIN I, POC: 0.01 ng/mL (ref 0.00–0.08)

## 2015-09-02 LAB — CBG MONITORING, ED: GLUCOSE-CAPILLARY: 124 mg/dL — AB (ref 65–99)

## 2015-09-02 LAB — ETHANOL: ALCOHOL ETHYL (B): 340 mg/dL — AB (ref <5)

## 2015-09-02 MED ORDER — POTASSIUM CHLORIDE 10 MEQ/100ML IV SOLN: 10.0000 meq | INTRAVENOUS | Status: DC

## 2015-09-02 MED ORDER — LORAZEPAM 2 MG/ML IJ SOLN
1.0000 mg | Freq: Once | INTRAMUSCULAR | Status: AC
  Administered 2015-09-02: 1 mg via INTRAVENOUS
  Filled 2015-09-02: qty 1

## 2015-09-02 NOTE — ED Notes (Signed)
Pt brought to ED by [facility], from home for stroke like sx. Pt last seen well by husband at 1645 when she fell on outside deck, pt was assisted by husband to stand up and she was completely normal and started getting progressive confuse with slurred speech around 1700, unable to walk. Pt continue to have slurred speech PTA while in the ambulance and started getting combative for a littler bit. VS 130/90 BP, 86-HR, R 20, SPO2 99% on 2L, CBG 158.

## 2015-09-02 NOTE — Consult Note (Signed)
Admission H&P    Chief Complaint: Acute onset of altered mental status, slurred speech and unsteady gait.  HPI: [name] is an 65 y.o. female with a history of COPD, chronic low back pain, cardiac dysrhythmia, arthritis, anxiety and depression, brought to the emergency room and code stroke status with change in mental status with confusion and agitation as well as slurred speech and unsteady gait. Patient fell at home and was unable to get to her feet without assistance. There was no loss of consciousness. No focal motor deficits were noted. She was last known well at 4:45 PM. She has no history of stroke nor TIA. She's been taking aspirin 81 mg per day. CT scan of her head showed no acute intracranial abnormality. Blood alcohol level was 340.  LSN: 4:45 PM on 09/02/2015 tPA Given: No: Beyond time window for treatment consideration; no clear expressive aphasia nor focal motor deficits. mRankin:  Past Medical History  Diagnosis Date  . Anxiety   . Arthritis   . Depression   . PSVT (paroxysmal supraventricular tachycardia) ([place])   . Heart murmur   . Back pain, chronic   . Dysrhythmia     hx svt  . COPD (chronic obstructive pulmonary disease) (H[facility])     emphysema no meds  . Complication of anesthesia     per patient "difficult stick- needs central line or picc"  . PONV (postoperative nausea and vomiting)     nausea occ    Past Surgical History  Procedure Laterality Date  . Tubal ligation    . Cervical disc surgery    . Colectomy    . Knee surgery      bone spur  65 yrs old  . Tonsillectomy    . Spine surgery  1983-01/2007  . Breast surgery  1976    breast reduction  . Back surgery      14 back surgery ,neck and lumbar fused  . Total hip arthroplasty Right 07/31/2014    Procedure: TOTAL HIP ARTHROPLASTY ANTERIOR APPROACH;  Surgeon: Renette Butters, MD;  Location: [place];  Service: Orthopedics;  Laterality: Right;    Family History  Problem Relation Age of Onset  . COPD  Father   . Diabetes Mother   . Heart disease Mother   . Kidney disease Mother   . Hypertension Mother    Social History:  reports that she quit smoking about 7 years ago. She has quit using smokeless tobacco. She reports that she drinks alcohol. She reports that she does not use illicit drugs.  Allergies:  Allergies  Allergen Reactions  . Bupropion Hcl Swelling  . Morphine Itching    Medications: Preadmission medications were reviewed by me.  ROS: History obtained from spouse  General ROS: negative for - chills, fatigue, fever, night sweats, weight gain or weight loss Psychological ROS: negative for - behavioral disorder, hallucinations, memory difficulties, mood swings or suicidal ideation Ophthalmic ROS: negative for - blurry vision, double vision, eye pain or loss of vision ENT ROS: negative for - epistaxis, nasal discharge, oral lesions, sore throat, tinnitus or vertigo Allergy and Immunology ROS: negative for - hives or itchy/watery eyes Hematological and Lymphatic ROS: negative for - bleeding problems, bruising or swollen lymph nodes Endocrine ROS: negative for - galactorrhea, hair pattern changes, polydipsia/polyuria or temperature intolerance Respiratory ROS: negative for - cough, hemoptysis, shortness of breath or wheezing Cardiovascular ROS: negative for - chest pain, dyspnea on exertion, edema or irregular heartbeat Gastrointestinal ROS: negative for -  abdominal pain, diarrhea, hematemesis, nausea/vomiting or stool incontinence Genito-Urinary ROS: negative for - dysuria, hematuria, incontinence or urinary frequency/urgency Musculoskeletal ROS: Multiple back surgeries with chronic pain syndrome Neurological ROS: as noted in HPI Dermatological ROS: negative for rash and skin lesion changes  Physical Examination: Blood pressure 103/49, pulse 88, temperature 98.7 F (37.1 C), temperature source Oral, resp. rate 21, height 5\' 8"  (1.727 m), weight 54.885 kg (121 lb), SpO2  94 %.  HEENT-  Normocephalic, no lesions, without obvious abnormality.  Normal external eye and conjunctiva.  Normal TM's bilaterally.  Normal auditory canals and external ears. Normal external nose, mucus membranes and septum.  Normal pharynx. Neck supple with no masses, nodes, nodules or enlargement. Cardiovascular - regular rate and rhythm, S1, S2 normal, no murmur, click, rub or gallop Lungs - chest clear, no wheezing, rales, normal symmetric air entry Abdomen - soft, non-tender; bowel sounds normal; no masses,  no organomegaly Extremities - no joint deformities, effusion, or inflammation and no edema  Neurologic Examination: Mental Status: Alert, oriented age and current month, disoriented to place, moderately anxious.  Speech fluent with no clear expressive aphasia. Patient had severe short-term memory difficulty. Able to follow commands without difficulty. Cranial Nerves: II-Visual fields were normal. III/IV/VI-Pupils were equal and reacted. Extraocular movements were full and conjugate.    V/VII-no facial numbness and no facial weakness. VIII-normal. X-normal speech and symmetrical palatal movement. XI: trapezius strength/neck flexion strength normal bilaterally XII-midline tongue extension with normal strength. Motor: 5/5 bilaterally with normal tone and bulk Sensory: Normal throughout. Deep Tendon Reflexes: 1+ and symmetric except for absent right knee jerk. Plantars: Flexor bilaterally Cerebellar: Normal finger-to-nose testing. Carotid auscultation: Normal  Results for orders placed or performed during the hospital encounter of 09/02/15 (from the past 48 hour(s))  CBG monitoring, ED     Status: Abnormal   Collection Time: 09/02/15  9:05 PM  Result Value Ref Range   Glucose-Capillary 124 (H) 65 - 99 mg/dL  I-stat troponin, ED (not at [facility], [facility])     Status: None   Collection Time: 09/02/15  9:11 PM  Result Value Ref Range   Troponin i, poc 0.01 0.00 - 0.08 ng/mL   Comment  3            Comment: Due to the release kinetics of cTnI, a negative result within the first hours of the onset of symptoms does not rule out myocardial infarction with certainty. If myocardial infarction is still suspected, repeat the test at appropriate intervals.   I-Stat Chem 8, ED  (not at [facility], [facility])     Status: Abnormal   Collection Time: 09/02/15  9:13 PM  Result Value Ref Range   Sodium 145 135 - 145 mmol/L   Potassium 3.0 (L) 3.5 - 5.1 mmol/L   Chloride 105 101 - 111 mmol/L   BUN 12 6 - 20 mg/dL   Creatinine, Ser 1.40 (H) 0.44 - 1.00 mg/dL   Glucose, Bld 141 (H) 65 - 99 mg/dL   Calcium, Ion 1.08 (L) 1.13 - 1.30 mmol/L   TCO2 24 0 - 100 mmol/L   Hemoglobin 15.3 (H) 12.0 - 15.0 g/dL   HCT 45.0 36.0 - 46.0 %   Ct Head Wo Contrast  09/02/2015  CLINICAL DATA:  65 year old female with fall with pain. Patient presenting with slurred speech and concern for stroke. EXAM: CT HEAD WITHOUT CONTRAST TECHNIQUE: Contiguous axial images were obtained from the base of the skull through the vertex without intravenous contrast. COMPARISON:  Head  CT dated 01/23/2007 FINDINGS: The ventricles and sulci are appropriate in size for the patient's age. Mild periventricular and deep white matter chronic microvascular ischemic changes noted. There is no acute intracranial hemorrhage. No mass effect or midline shift. Stable appearing punctate scattered calcific densities over the right convexity noted. The visualized paranasal sinuses and mastoid air cells are clear. The calvarium is intact. IMPRESSION: No acute intracranial hemorrhage. Minimal chronic microvascular ischemic changes. If symptoms persist and there are no contraindications, MRI may provide better evaluation if clinically indicated These results were called by telephone at the time of interpretation on 09/02/2015 at 9:28 pm to Dr. Nicole Kindred, who verbally acknowledged these results. Electronically Signed   By: Anner Crete M.D.   On: 09/02/2015 21:30     Assessment: 65 y.o. female presenting with acute alcohol intoxication. This may well be the basis for mental status changes in addition to unstable gait. She has no clinical signs of an acute stroke at this point. Korsakoff syndrome as somewhat unlikely but cannot be ruled out at this point.  Recommendations: 1. Alcohol withdrawal precautions with CIWA protocol 2. Thiamine 100 mg IV daily, for now 3. Folic acid and multivitamins daily 5. MRI of the brain when feasible 6. Alcohol rehabilitation following acute admission, if patient is agreeable  We will continue to follow this patient with you.   C.R. Nicole Kindred, MD  [facility] [phone]  09/02/2015, 9:40 PM

## 2015-09-02 NOTE — ED Notes (Addendum)
CRITICAL VALUE ALERT  Critical value received:  ETOH 340  Dr. Kathrynn Humble notified.

## 2015-09-02 NOTE — ED Notes (Signed)
Pt arrived via [facility] from home.  Per EMS and husband, pt was normal at 1645.  Following that, husband reports she fell and was found prone, with no LOC and minimal trauma.  On arrival pt had no focal deficits but was confused and had mildly slurred speech.  CT negative for ICH, pt outside TPA window.  Per Dr Nicole Kindred, plan for MRI and admission.  Husband at bedside and updated by Dr Nicole Kindred and Dr Kathrynn Humble.  NIHSS 2, see stroke log for details and times.  Bedside handoff with ED RN Jasminia.

## 2015-09-02 NOTE — ED Notes (Signed)
Pt became very agitated on MRI even after 1 mg Ativan IV given, Dr. Kathrynn Humble notified, pt to be taken back to her room at this time, unable to complete MRI.

## 2015-09-03 LAB — PHOSPHORUS: Phosphorus: 2.1 mg/dL — ABNORMAL LOW (ref 2.5–4.6)

## 2015-09-03 LAB — MAGNESIUM: Magnesium: 2.4 mg/dL (ref 1.7–2.4)

## 2015-09-03 NOTE — ED Provider Notes (Addendum)
CSN: [id number]     Arrival date & time 09/02/15  2100 History   First MD Initiated Contact with Patient 09/02/15 2101     Chief Complaint  Patient presents with  . Code Stroke    @EDPCLEARED @ (Consider location/radiation/quality/duration/timing/severity/associated sxs/prior Treatment) HPI Comments: Pt comes in with cc of confusion, fall, slurred speech. PT has no hx of stroke. Last normal at 4:30 pm. Per EMS, pt's husband heard a loud thud, so patient had fallen down and pt was noted to be slurring, confused and unable to ambulate. Pt wasn't improving, so EMS was called. CODE STROKE activated by EMS. Pt is slurring and appears confused, but following simple commands.   ROS 10 Systems reviewed and are negative for acute change except as noted in the HPI.     The history is provided by the patient.    Past Medical History  Diagnosis Date  . Anxiety   . Arthritis   . Depression   . PSVT (paroxysmal supraventricular tachycardia) ([place])   . Heart murmur   . Back pain, chronic   . Dysrhythmia     hx svt  . COPD (chronic obstructive pulmonary disease) (H[facility])     emphysema no meds  . Complication of anesthesia     per patient "difficult stick- needs central line or picc"  . PONV (postoperative nausea and vomiting)     nausea occ   Past Surgical History  Procedure Laterality Date  . Tubal ligation    . Cervical disc surgery    . Colectomy    . Knee surgery      bone spur  65 yrs old  . Tonsillectomy    . Spine surgery  1983-01/2007  . Breast surgery  1976    breast reduction  . Back surgery      16 back surgery ,neck and lumbar fused  . Total hip arthroplasty Right 07/31/2014    Procedure: TOTAL HIP ARTHROPLASTY ANTERIOR APPROACH;  Surgeon: Renette Butters, MD;  Location: [place];  Service: Orthopedics;  Laterality: Right;   Family History  Problem Relation Age of Onset  . COPD Father   . Diabetes Mother   . Heart disease Mother   . Kidney disease Mother   .  Hypertension Mother    Social History  Substance Use Topics  . Smoking status: Former Smoker    Quit date: 05/06/2008  . Smokeless tobacco: Former Systems developer  . Alcohol Use: 0.0 oz/week    5-6 Glasses of wine per week     Comment: occasional   OB History    No data available     Review of Systems    Allergies  Amoxicillin-pot clavulanate; Bupropion hcl; Celecoxib; Methadone; Morphine; and Naproxen  Home Medications   Prior to Admission medications   Medication Sig Start Date End Date Taking? Authorizing Provider  aspirin EC 81 MG tablet Take 81 mg by mouth at bedtime.   Yes Historical Provider, MD  fish oil-omega-3 fatty acids 1000 MG capsule Take 1 g by mouth daily.    Yes Historical Provider, MD  atenolol (TENORMIN) 25 MG tablet Take 1 tablet  In the morning and 1/2 in the PM 02/28/15   Troy Sine, MD  atorvastatin (LIPITOR) 10 MG tablet Take 1 tablet (10 mg total) by mouth daily. 02/28/15   Troy Sine, MD  baclofen (LIORESAL) 20 MG tablet Take 20 mg by mouth 3 (three) times daily.     Historical Provider, MD  Calcium Carbonate-Vitamin  D (CALCIUM + D PO) Take 1 tablet by mouth 2 (two) times daily. Calcium Magnesium Zinc and Vitamin D    Historical Provider, MD  chlorhexidine (PERIDEX) 0.12 % solution Use as directed 15 mLs in the mouth or throat 2 (two) times daily.  09/17/11   Historical Provider, MD  clonazePAM (KLONOPIN) 1 MG tablet Take 1 mg by mouth at bedtime.    Historical Provider, MD  docusate sodium (COLACE) 100 MG capsule Take 1 capsule (100 mg total) by mouth 2 (two) times daily. 07/31/14   Brittney Claiborne Billings, PA-C  HYDROcodone-acetaminophen (NORCO) 10-325 MG tablet Take 1 tablet by mouth every 4 (four) hours as needed. 07/03/15   Historical Provider, MD  Multiple Vitamin (MULTIVITAMIN WITH MINERALS) TABS tablet Take 1 tablet by mouth daily.    Historical Provider, MD  ondansetron (ZOFRAN) 4 MG tablet Take 1 tablet (4 mg total) by mouth every 8 (eight) hours as needed for  nausea or vomiting. 07/31/14   Brittney Claiborne Billings, PA-C  sertraline (ZOLOFT) 100 MG tablet Take 100 mg by mouth daily.    Historical Provider, MD  verapamil (CALAN-SR) 240 MG CR tablet TAKE 1 TABLET AT BEDTIME. 02/28/15   Troy Sine, MD   BP 106/69 mmHg  Pulse 81  Temp(Src) 98.5 F (36.9 C) (Oral)  Resp 18  Ht 5\' 8"  (1.727 m)  Wt 121 lb (54.885 kg)  BMI 18.40 kg/m2  SpO2 98% Physical Exam  Constitutional: She appears well-developed.  HENT:  Head: Normocephalic and atraumatic.  Eyes: EOM are normal.  Neck: Normal range of motion. Neck supple.  Cardiovascular: Normal rate.   Pulmonary/Chest: Effort normal.  Abdominal: Bowel sounds are normal.  Neurological: No cranial nerve deficit. Coordination normal.  confused  Skin: Skin is warm and dry.  Nursing note and vitals reviewed.   ED Course  Procedures (including critical care time) Labs Review Labs Reviewed  ETHANOL - Abnormal; Notable for the following:    Alcohol, Ethyl (B) 340 (*)    All other components within normal limits  CBC - Abnormal; Notable for the following:    MCV 104.3 (*)    All other components within normal limits  COMPREHENSIVE METABOLIC PANEL - Abnormal; Notable for the following:    Potassium 3.1 (*)    Glucose, Bld 147 (*)    Total Protein 5.9 (*)    All other components within normal limits  PHOSPHORUS - Abnormal; Notable for the following:    Phosphorus 2.1 (*)    All other components within normal limits  I-STAT CHEM 8, ED - Abnormal; Notable for the following:    Potassium 3.0 (*)    Creatinine, Ser 1.40 (*)    Glucose, Bld 141 (*)    Calcium, Ion 1.08 (*)    Hemoglobin 15.3 (*)    All other components within normal limits  CBG MONITORING, ED - Abnormal; Notable for the following:    Glucose-Capillary 124 (*)    All other components within normal limits  PROTIME-INR  APTT  DIFFERENTIAL  URINE RAPID DRUG SCREEN, HOSP PERFORMED  URINALYSIS, ROUTINE W REFLEX MICROSCOPIC (NOT AT [facility])   MAGNESIUM  I-STAT TROPOININ, ED    Imaging Review Ct Head Wo Contrast  09/02/2015  CLINICAL DATA:  65 year old female with fall with pain. Patient presenting with slurred speech and concern for stroke. EXAM: CT HEAD WITHOUT CONTRAST TECHNIQUE: Contiguous axial images were obtained from the base of the skull through the vertex without intravenous contrast. COMPARISON:  Head CT dated 01/23/2007  FINDINGS: The ventricles and sulci are appropriate in size for the patient's age. Mild periventricular and deep white matter chronic microvascular ischemic changes noted. There is no acute intracranial hemorrhage. No mass effect or midline shift. Stable appearing punctate scattered calcific densities over the right convexity noted. The visualized paranasal sinuses and mastoid air cells are clear. The calvarium is intact. IMPRESSION: No acute intracranial hemorrhage. Minimal chronic microvascular ischemic changes. If symptoms persist and there are no contraindications, MRI may provide better evaluation if clinically indicated These results were called by telephone at the time of interpretation on 09/02/2015 at 9:28 pm to Dr. Nicole Kindred, who verbally acknowledged these results. Electronically Signed   By: Anner Crete M.D.   On: 09/02/2015 21:30   I have personally reviewed and evaluated these images and lab results as part of my medical decision-making.   EKG Interpretation   Date/Time:  Monday Sep 02 2015 21:27:36 EDT Ventricular Rate:  87 PR Interval:  235 QRS Duration: 115 QT Interval:  386 QTC Calculation: 464 R Axis:   -61 Text Interpretation:  Sinus rhythm Prolonged PR interval Biatrial  enlargement Incomplete RBBB and LAFB Anteroseptal infarct, age  indeterminate No acute changes Nonspecific ST and T wave abnormality  Confirmed by Kathrynn Humble, MD, Thelma Comp [phone]) on 09/03/2015 12:37:59 AM      MDM   Final diagnoses:  Transient global amnesia  Alcohol intoxication, with delirium ([place])    Pt comes  in as code stroke activation. She is noted to be slurring her speech and confused. CT head is neg. Neuro exam is not focal and pt is not a TPA candidate. ETOH came elevated at 340 - which is likely the cause for her symptoms.  Pt reassessed at 12:30. She is no longer slurred. Husband at bedside. Pt admits to heavy alcohol today due to her IBS and family issues stressing her. Pt had refused MRI - and i think that's fine, as i dont think she had a stroke. We will ensure pt can ambulate and d/c her when she can to her husband.   Varney Biles, MD 09/03/15 0044  1:02 AM Patient has ambulated - will d/c.  Varney Biles, MD 09/03/15 [phone]

## 2015-09-03 NOTE — ED Notes (Signed)
Report attempted. RN to call back.

## 2015-09-03 NOTE — ED Notes (Signed)
Walked with pt to and from the bathroom, patient walked fine with no problems.

## 2015-09-03 NOTE — Discharge Instructions (Signed)
Alcohol Intoxication  Alcohol intoxication occurs when the amount of alcohol that a person has consumed impairs his or her ability to mentally and physically function. Alcohol directly impairs the normal chemical activity of the brain. Drinking large amounts of alcohol can lead to changes in mental function and behavior, and it can cause many physical effects that can be harmful.   Alcohol intoxication can range in severity from mild to very severe. Various factors can affect the level of intoxication that occurs, such as the person's age, gender, weight, frequency of alcohol consumption, and the presence of other medical conditions (such as diabetes, seizures, or heart conditions). Dangerous levels of alcohol intoxication may occur when people drink large amounts of alcohol in a short period (binge drinking). Alcohol can also be especially dangerous when combined with certain prescription medicines or "recreational" drugs.  SIGNS AND SYMPTOMS  Some common signs and symptoms of mild alcohol intoxication include:  · Loss of coordination.  · Changes in mood and behavior.  · Impaired judgment.  · Slurred speech.  As alcohol intoxication progresses to more severe levels, other signs and symptoms will appear. These may include:  · Vomiting.  · Confusion and impaired memory.  · Slowed breathing.  · Seizures.  · Loss of consciousness.  DIAGNOSIS   Your health care provider will take a medical history and perform a physical exam. You will be asked about the amount and type of alcohol you have consumed. Blood tests will be done to measure the concentration of alcohol in your blood. In many places, your blood alcohol level must be lower than 80 mg/dL (0.08%) to legally drive. However, many dangerous effects of alcohol can occur at much lower levels.   TREATMENT   People with alcohol intoxication often do not require treatment. Most of the effects of alcohol intoxication are temporary, and they go away as the alcohol naturally  leaves the body. Your health care provider will monitor your condition until you are stable enough to go home. Fluids are sometimes given through an IV access tube to help prevent dehydration.   HOME CARE INSTRUCTIONS  · Do not drive after drinking alcohol.  · Stay hydrated. Drink enough water and fluids to keep your urine clear or pale yellow. Avoid caffeine.    · Only take over-the-counter or prescription medicines as directed by your health care provider.    SEEK MEDICAL CARE IF:   · You have persistent vomiting.    · You do not feel better after a few days.  · You have frequent alcohol intoxication. Your health care provider can help determine if you should see a substance use treatment counselor.  SEEK IMMEDIATE MEDICAL CARE IF:   · You become shaky or tremble when you try to stop drinking.    · You shake uncontrollably (seizure).    · You throw up (vomit) blood. This may be bright red or may look like black coffee grounds.    · You have blood in your stool. This may be bright red or may appear as a black, tarry, bad smelling stool.    · You become lightheaded or faint.    MAKE SURE YOU:   · Understand these instructions.  · Will watch your condition.  · Will get help right away if you are not doing well or get worse.     This information is not intended to replace advice given to you by your health care provider. Make sure you discuss any questions you have with your health care provider.       Document Released: 01/21/2005 Document Revised: 12/14/2012 Document Reviewed: 09/16/2012  Elsevier Interactive Patient Education ©2016 [employer]

## 2015-10-03 DIAGNOSIS — M961 Postlaminectomy syndrome, not elsewhere classified: Secondary | ICD-10-CM | POA: Diagnosis not present

## 2015-10-03 DIAGNOSIS — M546 Pain in thoracic spine: Secondary | ICD-10-CM | POA: Diagnosis not present

## 2015-10-03 DIAGNOSIS — M47816 Spondylosis without myelopathy or radiculopathy, lumbar region: Secondary | ICD-10-CM | POA: Diagnosis not present

## 2015-10-03 DIAGNOSIS — G894 Chronic pain syndrome: Secondary | ICD-10-CM | POA: Diagnosis not present

## 2015-10-04 ENCOUNTER — Telehealth: Payer: Self-pay | Admitting: *Deleted

## 2015-10-04 DIAGNOSIS — M1612 Unilateral primary osteoarthritis, left hip: Secondary | ICD-10-CM | POA: Diagnosis not present

## 2015-10-04 NOTE — Telephone Encounter (Signed)
  Requesting surgical clearance:   1. Type of surgery: LEFT TOTAL HIP REPLACEMENT  2. Surgeon: Dr Fredonia Highland  3. Surgical date:  11/19/15  4. Medications that need to be held:    ASA 81 mg                  Special instructions: please note if patient has been cleared to have surgery.

## 2015-10-06 NOTE — Telephone Encounter (Signed)
Ok for surgery

## 2015-10-08 ENCOUNTER — Encounter: Payer: Self-pay | Admitting: Cardiovascular Disease

## 2015-10-08 ENCOUNTER — Ambulatory Visit (INDEPENDENT_AMBULATORY_CARE_PROVIDER_SITE_OTHER): Payer: Medicare Other | Admitting: Cardiovascular Disease

## 2015-10-08 VITALS — BP 90/42 | HR 60 | Ht 68.0 in | Wt 123.1 lb

## 2015-10-08 DIAGNOSIS — Z01818 Encounter for other preprocedural examination: Secondary | ICD-10-CM

## 2015-10-08 DIAGNOSIS — I471 Supraventricular tachycardia: Secondary | ICD-10-CM | POA: Diagnosis not present

## 2015-10-08 DIAGNOSIS — I059 Rheumatic mitral valve disease, unspecified: Secondary | ICD-10-CM | POA: Diagnosis not present

## 2015-10-08 DIAGNOSIS — I440 Atrioventricular block, first degree: Secondary | ICD-10-CM

## 2015-10-08 MED ORDER — ATENOLOL 25 MG PO TABS: ORAL_TABLET | ORAL | Status: DC

## 2015-10-08 NOTE — Patient Instructions (Addendum)
Your physician has requested that you have an echocardiogram. Echocardiography is a painless test that uses sound waves to create images of your heart. It provides your doctor with information about the size and shape of your heart and how well your heart's chambers and valves are working. This procedure takes approximately one hour. There are no restrictions for this procedure.  Your physician has recommended you make the following change in your medication:   1.) the tenormin ( atenolol) has been changed to 1/2 tablet twice a day.    Your physician recommends that you schedule a follow-up appointment pending echo results.

## 2015-10-08 NOTE — Telephone Encounter (Signed)
Faxed to Dr Hubbard Hartshorn via EPIC @ [phone]

## 2015-10-10 ENCOUNTER — Encounter: Payer: Self-pay | Admitting: Cardiovascular Disease

## 2015-10-10 DIAGNOSIS — Z01818 Encounter for other preprocedural examination: Secondary | ICD-10-CM | POA: Insufficient documentation

## 2015-10-10 NOTE — Progress Notes (Signed)
Patient ID: [name], female   DOB: Sep 13, 1950, 65 y.o.   MRN: [id number]     HPI: [name] is a 65 y.o. female resents to the office today for a 7 month followup cardiology evaluation.   [name] has a history of supraventricular tachycardia which has been fairly well-controlled on combination verapamil SR  plus beta blocker therapy with atenolol.  She has a history of underlying mitral valve prolapse, mild carotid disease, hyperlipidemia, as well as mild COPD. She had smoked for over 30 years but quit smoking approximately 7 years ago.  Four years ago, she was hospitalized after being bitten by a copperhead snake and required 5 days in the hospital. Apparently her cardiac medicines were held at that time and during the hospitalization she had recurrent episodes of tachycardia dysrhythmia. As long as she takes her medicines, and she feels that her heart rhythm is controlled. There is rare intermittent palpitations.typically, these episodes may last for under a minute and typically resolve with the Valsalva maneuver if they don't resolve spontaneously.  Since I last saw her, she is unaware of any breakthrough arrhythmias.  She has been on Verapamil SR 240 mg in addition to atenolol 25 mg in the morning and 12.5 mg at night.  She continues to exercise regularly and previously had walked up to 2 miles per day and also does water aerobics.  She is in need for presurgical clearance prior to left hip surgery which is scheduled to be done on July 25 by Dr. Fredonia Highland.  Review of her chart reveals that she recently had developed a fall after she had had heavy alcohol ingestion at a party.  Of note, her alcohol level was 340.  She states this was an unusual occurrence that she normally does not drink excessiviely.  She denies chest pain or shortness of breath.  Past Medical History  Diagnosis Date  . Anxiety   . Arthritis   . Depression   . PSVT (paroxysmal supraventricular tachycardia) ([place])    . Heart murmur   . Back pain, chronic   . Dysrhythmia     hx svt  . COPD (chronic obstructive pulmonary disease) (HCC)     emphysema no meds  . Complication of anesthesia     per patient "difficult stick- needs central line or picc"  . PONV (postoperative nausea and vomiting)     nausea occ    Past Surgical History  Procedure Laterality Date  . Tubal ligation    . Cervical disc surgery    . Colectomy    . Knee surgery      bone spur  65 yrs old  . Tonsillectomy    . Spine surgery  1983-01/2007  . Breast surgery  1976    breast reduction  . Back surgery      14 back surgery ,neck and lumbar fused  . Total hip arthroplasty Right 07/31/2014    Procedure: TOTAL HIP ARTHROPLASTY ANTERIOR APPROACH;  Surgeon: Renette Butters, MD;  Location: [facility];  Service: Orthopedics;  Laterality: Right;    Allergies  Allergen Reactions  . Amoxicillin-Pot Clavulanate Nausea And Vomiting  . Bupropion Hcl Swelling  . Celecoxib Swelling  . Methadone Nausea And Vomiting  . Morphine Itching  . Naproxen Swelling    Current Outpatient Prescriptions  Medication Sig Dispense Refill  . aspirin EC 81 MG tablet Take 81 mg by mouth at bedtime.    Marland Kitchen atenolol (TENORMIN) 25 MG tablet Take 1/2  tablet twice a day 135 tablet 3  . atorvastatin (LIPITOR) 10 MG tablet Take 1 tablet (10 mg total) by mouth daily. (Patient taking differently: Take 10 mg by mouth. Take 1 tab by mouth in the morning and half tab in the evening) 90 tablet 3  . baclofen (LIORESAL) 20 MG tablet Take 20 mg by mouth 3 (three) times daily.     . Calcium Carbonate-Vitamin D (CALCIUM + D PO) Take 1 tablet by mouth 2 (two) times daily. Calcium Magnesium Zinc and Vitamin D    . chlorhexidine (PERIDEX) 0.12 % solution Use as directed 15 mLs in the mouth or throat 2 (two) times daily.     . clonazePAM (KLONOPIN) 1 MG tablet Take 1 mg by mouth at bedtime.    . fish oil-omega-3 fatty acids 1000 MG capsule Take 1 g by mouth daily.     Marland Kitchen  HYDROcodone-acetaminophen (NORCO) 10-325 MG tablet Take 1 tablet by mouth every 4 (four) hours as needed.  0  . Multiple Vitamin (MULTIVITAMIN WITH MINERALS) TABS tablet Take 1 tablet by mouth daily.    . sertraline (ZOLOFT) 100 MG tablet Take 100 mg by mouth daily.    . verapamil (CALAN-SR) 240 MG CR tablet TAKE 1 TABLET AT BEDTIME. 90 tablet 3   No current facility-administered medications for this visit.    Social History   Social History  . Marital Status: Married    Spouse Name: N/A  . Number of Children: N/A  . Years of Education: N/A   Occupational History  . Not on file.   Social History Main Topics  . Smoking status: Former Smoker    Quit date: 05/06/2008  . Smokeless tobacco: Former Systems developer  . Alcohol Use: 0.0 oz/week    5-6 Glasses of wine per week     Comment: occasional  . Drug Use: No  . Sexual Activity: Yes   Other Topics Concern  . Not on file   Social History Narrative   Social history is notable in that she is married. She has one stepchild and 2 stepgrandchildren. She does walk. She has no recent tobacco use or alcohol use  Family History  Problem Relation Age of Onset  . COPD Father   . Diabetes Mother   . Heart disease Mother   . Kidney disease Mother   . Hypertension Mother     ROS General: Negative; No fevers, chills, or night sweats;  HEENT: Negative; No changes in vision or hearing, sinus congestion, difficulty swallowing Pulmonary: positive for mild COPD; No cough, wheezing, shortness of breath, hemoptysis Cardiovascular: Negative; No chest pain, presyncope, syncope, palpitations GI: Negative; No nausea, vomiting, diarrhea, or abdominal pain GU: Negative; No dysuria, hematuria, or difficulty voiding Musculoskeletal: Negative; no myalgias, joint pain, or weakness Hematologic/Oncology: Negative; no easy bruising, bleeding Endocrine: Negative; no heat/cold intolerance; no diabetes Neuro: Negative; no changes in balance, headaches Skin:  Negative; No rashes or skin lesions Psychiatric: positive for mild depression for which she takes Zoloft.; No behavioral problems,  Sleep: Negative; No snoring, daytime sleepiness, hypersomnolence, bruxism, restless legs, hypnogognic hallucinations, no cataplexy Other comprehensive 14 point system review is negative.   PE BP 90/42 mmHg  Pulse 60  Ht _0  (1.727 m)  Wt 123 lb 2 oz (55.849 kg)  BMI 18.73 kg/m2   Repeat blood pressure by me was 104/60 supine and 88/60 standing.  Wt Readings from Last 3 Encounters:  10/08/15 123 lb 2 oz (55.849 kg)  09/02/15 121 lb (54.885  kg)  02/28/15 121 lb (54.885 kg)   General: Alert, oriented, no distress.  Skin: normal turgor, no rashes HEENT: Normocephalic, atraumatic. Pupils round and reactive; sclera anicteric;no lid lag.  Nose without nasal septal hypertrophy Mouth/Parynx benign; Mallinpatti scale 2 Neck: No JVD, no carotid bruits with normal carotid upstroke Lungs: clear to ausculatation and percussion; no wheezing or rales Chest wall: Nontender to palpation Heart: RRR, s1 s2 normal 1/6 systolic murmur with an intermittent systolic click consistent with her mitral valve prolapse.Marland Kitchen  No S3 gallop.  No diastolic murmur, rubs thrills or heaves. Abdomen: soft, nontender; no hepatosplenomehaly, BS+; abdominal aorta nontender and not dilated by palpation. Back: No CVA tenderness Pulses 2+ Extremities: no clubbing cyanosis or edema, Homan's sign negative  Neurologic: grossly nonfocal Psychologic: normal affect and mood.  ECG (independently read by me): Normal sinus rhythm at 60 bpm.  First-degree AV block with a PR interval of 220 ms.  No significant ST changes.  Incomplete right bundle branch block.   November 2016 ECG (independently read by me): normal sinus rhythm with first-degree heart block with a PR interval at 270 ms.  QRS complex V1 V2.  November 2015 ECG (independently read by me.  (: Normal sinus rhythm at 64 bpm.  Borderline first  degree AV block with a PR interval at 204 ms.  Mild RV conduction delay.  Anteroseptal Q waves, unchanged  October 2014ECG: Sinus rhythm with incomplete right bundle branch block. Anteroseptal Q waves unchanged.  LABS:  BMP Latest Ref Rng 09/02/2015 09/02/2015 02/28/2015  Glucose 65 - 99 mg/dL 147(H) 141(H) 83  BUN 6 - 20 mg/dL _0 Creatinine 0.44 - 1.00 mg/dL 0.92 1.40(H) 0.80  Sodium 135 - 145 mmol/L 145 145 141  Potassium 3.5 - 5.1 mmol/L 3.1(L) 3.0(L) 4.5  Chloride 101 - 111 mmol/L 107 105 103  CO2 22 - 32 mmol/L 24 - 27  Calcium 8.9 - 10.3 mg/dL 9.4 - 9.0   Hepatic Function Latest Ref Rng 09/02/2015 02/28/2015 02/23/2013  Total Protein 6.5 - 8.1 g/dL 5.9(L) 6.1 7.1  Albumin 3.5 - 5.0 g/dL 3.7 3.9 4.8  AST 15 - 41 U/L 31 41(H) 28  ALT 14 - 54 U/L 27 32(H) 23  Alk Phosphatase 38 - 126 U/L 71 74 72  Total Bilirubin 0.3 - 1.2 mg/dL 0.4 0.7 0.7  Bilirubin, Direct 0.0 - 0.3 mg/dL - - -   CBC Latest Ref Rng 09/02/2015 09/02/2015 02/28/2015  WBC 4.0 - 10.5 K/uL 6.1 - 4.7  Hemoglobin 12.0 - 15.0 g/dL 14.3 15.3(H) 14.9  Hematocrit 36.0 - 46.0 % 43.9 45.0 44.9  Platelets 150 - 400 K/uL 217 - 190   Lab Results  Component Value Date   MCV 104.3* 09/02/2015   MCV 101.8* 02/28/2015   MCV 104.2* 07/18/2014   Lab Results  Component Value Date   TSH 1.176 02/28/2015   Lipid Panel     Component Value Date/Time   CHOL 155 02/28/2015 1023   TRIG 150* 02/28/2015 1023   HDL 47 02/28/2015 1023   CHOLHDL 3.3 02/28/2015 1023   VLDL 30 02/28/2015 1023   LDLCALC 78 02/28/2015 1023     RADIOLOGY: No results found.    ASSESSMENT AND PLAN: [name] is a 65 year old female who has a history of SVTwhich has been fairly well-controlled on verapamil 240 mg as well as atenolol 25 twice a day. In the past, when she just try to reduce her dose or skips a dose intermittently she has  noticed recurrent palpitations. Her last echo Doppler study in 2011 showed normal systolic function and borderline  mitral valve prolapse with trace MR. A nuclear study showed normal perfusion.  When I last saw her, I reduced her atenolol to 25 mg in the morning and 12.5 mg at night.  She denies any awareness of breakthrough palpitations on this reduced dose.  Her blood pressure today continues to be low and there is a mild orthostatic drop.  Her resting pulse is 60.  I have recommended she continue to take verapamil SR 240 mg but I will now try to slightly reduce her atenolol to 12.5 mg twice a day.  I am scheduling her for a 2-D echo Doppler study to reassess systolic and diastolic function as well as valvular architecture.  She will be given preoperative clearance to undergo her planned hip surgery with Dr. Percell Miller unless her echo suggests significant abnormality requiring repeat cardiovascular assessment prior to her planned surgery.  She continues to be on atorvastatin and omega-3 fatty acid for hyperlipidemia.  Laboratory in November 2016 showed an LDL cholesterol at 78 and she will continue current treatment.  As long as she is stable, I will see her in 6 months for follow up evaluation.  Time spent: 25 minutes  Troy Sine, MD, [facility]  10/10/2015 2:59 PM

## 2015-10-18 DIAGNOSIS — I6529 Occlusion and stenosis of unspecified carotid artery: Secondary | ICD-10-CM | POA: Diagnosis not present

## 2015-10-18 DIAGNOSIS — R7303 Prediabetes: Secondary | ICD-10-CM | POA: Diagnosis not present

## 2015-10-18 DIAGNOSIS — M150 Primary generalized (osteo)arthritis: Secondary | ICD-10-CM | POA: Diagnosis not present

## 2015-10-18 DIAGNOSIS — F5101 Primary insomnia: Secondary | ICD-10-CM | POA: Diagnosis not present

## 2015-10-18 DIAGNOSIS — Z79899 Other long term (current) drug therapy: Secondary | ICD-10-CM | POA: Diagnosis not present

## 2015-10-18 DIAGNOSIS — I471 Supraventricular tachycardia: Secondary | ICD-10-CM | POA: Diagnosis not present

## 2015-10-18 DIAGNOSIS — K589 Irritable bowel syndrome without diarrhea: Secondary | ICD-10-CM | POA: Diagnosis not present

## 2015-10-18 DIAGNOSIS — M519 Unspecified thoracic, thoracolumbar and lumbosacral intervertebral disc disorder: Secondary | ICD-10-CM | POA: Diagnosis not present

## 2015-10-18 DIAGNOSIS — M5090 Cervical disc disorder, unspecified, unspecified cervical region: Secondary | ICD-10-CM | POA: Diagnosis not present

## 2015-10-18 DIAGNOSIS — J449 Chronic obstructive pulmonary disease, unspecified: Secondary | ICD-10-CM | POA: Diagnosis not present

## 2015-10-18 DIAGNOSIS — F330 Major depressive disorder, recurrent, mild: Secondary | ICD-10-CM | POA: Diagnosis not present

## 2015-10-18 DIAGNOSIS — E782 Mixed hyperlipidemia: Secondary | ICD-10-CM | POA: Diagnosis not present

## 2015-10-22 DIAGNOSIS — M1612 Unilateral primary osteoarthritis, left hip: Secondary | ICD-10-CM

## 2015-10-22 NOTE — H&P (Signed)
PREOPERATIVE H&P Patient ID: [name] MRN: [id number] DOB/AGE: May 20, 1950 65 y.o.  Chief Complaint: OA LEFT HIP  Planned Procedure Date: 11/19/15  Medical Clearance by Dr. Gilford Rile    Cardiac Clearance by Dr. Shelva Majestic   HPI: [name] is a 65 y.o. female with a history of chronic back pain with numerous surgeries on chronic pain medicine, COPD, and PSVT, who presents for evaluation of OA LEFT HIP. The patient has a history of pain and functional disability in the left hip due to arthritis and has failed non-surgical conservative treatments for greater than 12 weeks to include NSAID's and/or analgesics, corticosteriod injections and activity modification.  Onset of symptoms was gradual, starting ~8 months ago with gradually worsening course since that time.   Patient currently rates pain at 8 out of 10 with activity. Patient has night pain, worsening of pain with activity and weight bearing and pain that interferes with activities of daily living.  Patient has evidence of subchondral sclerosis and joint space narrowing by imaging studies. There is no active infection.  Past Medical History  Diagnosis Date  . Anxiety   . Arthritis   . Depression   . PSVT (paroxysmal supraventricular tachycardia) ([place])   . Heart murmur   . Back pain, chronic   . Dysrhythmia     hx svt  . COPD (chronic obstructive pulmonary disease) (H[facility])     emphysema no meds  . Complication of anesthesia     per patient "difficult stick- needs central line or picc"  . PONV (postoperative nausea and vomiting)     nausea occ   Past Surgical History  Procedure Laterality Date  . Tubal ligation    . Cervical disc surgery    . Colectomy    . Knee surgery      bone spur  65 yrs old  . Tonsillectomy    . Spine surgery  1983-01/2007  . Breast surgery  1976    breast reduction  . Back surgery      14 back surgery ,neck and lumbar fused  . Total hip arthroplasty Right 07/31/2014    Procedure: TOTAL HIP  ARTHROPLASTY ANTERIOR APPROACH;  Surgeon: Renette Butters, MD;  Location: [facility];  Service: Orthopedics;  Laterality: Right;   Allergies  Allergen Reactions  . Amoxicillin-Pot Clavulanate Nausea And Vomiting  . Bupropion Hcl Swelling  . Celecoxib Swelling  . Methadone Nausea And Vomiting  . Morphine Itching  . Naproxen Swelling   Prior to Admission medications   Medication Sig Start Date End Date Taking? Authorizing Provider  aspirin EC 81 MG tablet Take 81 mg by mouth at bedtime.    Historical Provider, MD  atenolol (TENORMIN) 25 MG tablet Take 1/2 tablet twice a day 10/08/15   Troy Sine, MD  atorvastatin (LIPITOR) 10 MG tablet Take 1 tablet (10 mg total) by mouth daily. Patient taking differently: Take 10 mg by mouth. Take 1 tab by mouth in the morning and half tab in the evening 02/28/15   Troy Sine, MD  baclofen (LIORESAL) 20 MG tablet Take 20 mg by mouth 3 (three) times daily.     Historical Provider, MD  Calcium Carbonate-Vitamin D (CALCIUM + D PO) Take 1 tablet by mouth 2 (two) times daily. Calcium Magnesium Zinc and Vitamin D    Historical Provider, MD  chlorhexidine (PERIDEX) 0.12 % solution Use as directed 15 mLs in the mouth or throat 2 (two) times daily.  09/17/11  Historical Provider, MD  clonazePAM (KLONOPIN) 1 MG tablet Take 1 mg by mouth at bedtime.    Historical Provider, MD  fish oil-omega-3 fatty acids 1000 MG capsule Take 1 g by mouth daily.     Historical Provider, MD  HYDROcodone-acetaminophen (NORCO) 10-325 MG tablet Take 1 tablet by mouth every 4 (four) hours as needed. 07/03/15   Historical Provider, MD  Multiple Vitamin (MULTIVITAMIN WITH MINERALS) TABS tablet Take 1 tablet by mouth daily.    Historical Provider, MD  sertraline (ZOLOFT) 100 MG tablet Take 100 mg by mouth daily.    Historical Provider, MD  verapamil (CALAN-SR) 240 MG CR tablet TAKE 1 TABLET AT BEDTIME. 02/28/15   Troy Sine, MD   Social History   Social History  . Marital Status:  Married    Spouse Name: N/A  . Number of Children: N/A  . Years of Education: N/A   Social History Main Topics  . Smoking status: Former Smoker    Quit date: 05/06/2008  . Smokeless tobacco: Former Systems developer  . Alcohol Use: 0.0 oz/week    5-6 Glasses of wine per week     Comment: occasional  . Drug Use: No  . Sexual Activity: Yes   Other Topics Concern  . Not on file   Social History Narrative   Family History  Problem Relation Age of Onset  . COPD Father   . Diabetes Mother   . Heart disease Mother   . Kidney disease Mother   . Hypertension Mother     ROS: Currently denies lightheadedness, dizziness, Fever, chills, CP, SOB. No personal history of DVT, PE, MI, or CVA. Upper and lower dentures All other systems have been reviewed and were otherwise negative with the exception of those mentioned in the HPI and as above.  Objective: Vitals: Ht: 5'7" Wt: 123 Temp: 98 BP: 126/72 Pulse: 75 O2 94% on room air. Physical Exam: General: Alert, NAD. Trendelenberg Gait  HEENT: EOMI, Good Neck Extension  Pulm: No increased work of breathing.  Clear B/L A/P w/o crackle or wheeze.  CV: RRR GI: soft, NT, ND Neuro: Neuro grossly intact b/l upper/lower ext.  Sensation intact distally Skin: No lesions in the area of chief complaint MSK/Surgical Site: Non tender over greater trochanter.  Pain with passive ROM.  Positive Stinchfield.  5/5 strength.  NVI.  Sensation intact distally on left.  Decreased distal sensation chronically on right due to fall/spine injury in 2008.  Imaging Review Plain radiographs demonstrate severe degenerative joint disease of the left hip.   Assessment: OA LEFT HIP Principal Problem:   Primary osteoarthritis of left hip Active Problems:   Mitral valve disorder   COPD (chronic obstructive pulmonary disease) (HCC)   History of tobacco abuse   SVT (supraventricular tachycardia) (HCC)   Hyperlipidemia   DJD (degenerative joint disease)   First degree AV  block   Plan: Plan for Procedure(s): TOTAL HIP ARTHROPLASTY ANTERIOR APPROACH  The patient history, physical exam, clinical judgement of the provider and imaging are consistent with end stage degenerative joint disease and total joint arthroplasty is deemed medically necessary. The treatment options including medical management, injection therapy, and arthroplasty were discussed at length. The risks and benefits of Procedure(s): TOTAL HIP ARTHROPLASTY ANTERIOR APPROACH were presented and reviewed.  The risks of nonoperative treatment, versus surgical intervention including but not limited to continued pain, aseptic loosening, stiffness, dislocation/subluxation, infection, bleeding, nerve injury, blood clots, cardiopulmonary complications, morbidity, mortality, among others were discussed. The patient verbalizes understanding  and wishes to proceed with the plan.  Patient is being admitted for inpatient treatment for surgery, pain control, PT, OT, prophylactic antibiotics, VTE prophylaxis, progressive ambulation, ADL's and discharge planning.   Dental prophylaxis discussed and recommended for 2 years postoperatively.  The patient does meet the criteria for TXA which will be used perioperatively via IV.   Xarelto  will be used postoperatively for DVT prophylaxis in addition to SCDs, and early ambulation. The patient is planning to be discharged home with home health services in care of her husband.  Charna Elizabeth Martensen III,PA-C 10/22/2015 5:15 PM

## 2015-10-25 ENCOUNTER — Other Ambulatory Visit: Payer: Self-pay

## 2015-10-25 ENCOUNTER — Ambulatory Visit (HOSPITAL_COMMUNITY): Payer: Medicare Other | Attending: Cardiology

## 2015-10-25 DIAGNOSIS — Z8249 Family history of ischemic heart disease and other diseases of the circulatory system: Secondary | ICD-10-CM | POA: Insufficient documentation

## 2015-10-25 DIAGNOSIS — J449 Chronic obstructive pulmonary disease, unspecified: Secondary | ICD-10-CM | POA: Diagnosis not present

## 2015-10-25 DIAGNOSIS — Z87891 Personal history of nicotine dependence: Secondary | ICD-10-CM | POA: Diagnosis not present

## 2015-10-25 DIAGNOSIS — I059 Rheumatic mitral valve disease, unspecified: Secondary | ICD-10-CM

## 2015-10-25 DIAGNOSIS — I440 Atrioventricular block, first degree: Secondary | ICD-10-CM

## 2015-10-25 LAB — ECHOCARDIOGRAM COMPLETE
CHL CUP DOP CALC LVOT VTI: 26.4 cm
CHL CUP MV DEC (S): 268
E decel time: 268 msec
E/e' ratio: 9.17
FS: 34 % (ref 28–44)
IVS/LV PW RATIO, ED: 0.89
LA ID, A-P, ES: 27 mm
LA vol: 34.8 mL
LADIAMINDEX: 1.63 cm/m2
LAVOLA4C: 38.3 mL
LAVOLIN: 21 mL/m2
LEFT ATRIUM END SYS DIAM: 27 mm
LV PW d: 8.56 mm — AB (ref 0.6–1.1)
LVEEAVG: 9.17
LVEEMED: 9.17
LVELAT: 9.25 cm/s
LVOT peak grad rest: 6 mmHg
LVOTPV: 120 cm/s
MV Peak grad: 3 mmHg
MV pk E vel: 84.8 m/s
MVPKAVEL: 82.6 m/s
TAPSE: 19.1 mm
TDI e' lateral: 9.25
TDI e' medial: 7.8

## 2015-10-28 ENCOUNTER — Telehealth: Payer: Self-pay | Admitting: *Deleted

## 2015-10-28 NOTE — Telephone Encounter (Signed)
-----   Message from Troy Sine, MD sent at 10/25/2015  4:30 PM EDT ----- Normal echo

## 2015-10-28 NOTE — Telephone Encounter (Signed)
Left results on home machine.

## 2015-11-06 NOTE — Pre-Procedure Instructions (Signed)
PRYSCILLA AMANN  11/06/2015      [facility], [place] [address] Phone: [phone] Fax: [phone]    Your procedure is scheduled on Tuesday, July 25.   Report to [facility] Admitting at 8:00 A.M.   Call this number if you have problems the morning of surgery:  [phone]   Remember:  Do not eat food or drink liquids after midnight.  Take these medicines the morning of surgery with A SIP OF WATER: atenolol (tenormin), baclofen (lioresal) if needed, hydrocodone-acetaminophen (Norco) if needed, sertraline (zoloft)  7 days prior to surgery STOP taking any Aspirin, Aleve, Naproxen, Ibuprofen, Motrin, Advil, Goody's, BC's, all herbal medications, fish oil, and all vitamins    Do not wear jewelry, make-up or nail polish.  Do not wear lotions, powders, or perfumes.  You may not wear deoderant.  Do not shave 48 hours prior to surgery.  Men may shave face and neck.  Do not bring valuables to the hospital.  [facility] is not responsible for any belongings or valuables.  Contacts, dentures or bridgework may not be worn into surgery.  Leave your suitcase in the car.  After surgery it may be brought to your room.  For patients admitted to the hospital, discharge time will be determined by your treatment team.  Patients discharged the day of surgery will not be allowed to drive home.   Special instructions:    [place]- Preparing For Surgery  Before surgery, you can play an important role. Because skin is not sterile, your skin needs to be as free of germs as possible. You can reduce the number of germs on your skin by washing with CHG (chlorahexidine gluconate) Soap before surgery.  CHG is an antiseptic cleaner which kills germs and bonds with the skin to continue killing germs even after washing.  Please do not use if you have an allergy to CHG or antibacterial soaps. If your skin becomes  reddened/irritated stop using the CHG.  Do not shave (including legs and underarms) for at least 48 hours prior to first CHG shower. It is OK to shave your face.  Please follow these instructions carefully.   1. Shower the NIGHT BEFORE SURGERY and the MORNING OF SURGERY with CHG.   2. If you chose to wash your hair, wash your hair first as usual with your normal shampoo.  3. After you shampoo, rinse your hair and body thoroughly to remove the shampoo.  4. Use CHG as you would any other liquid soap. You can apply CHG directly to the skin and wash gently with a scrungie or a clean washcloth.   5. Apply the CHG Soap to your body ONLY FROM THE NECK DOWN.  Do not use on open wounds or open sores. Avoid contact with your eyes, ears, mouth and genitals (private parts). Wash genitals (private parts) with your normal soap.  6. Wash thoroughly, paying special attention to the area where your surgery will be performed.  7. Thoroughly rinse your body with warm water from the neck down.  8. DO NOT shower/wash with your normal soap after using and rinsing off the CHG Soap.  9. Pat yourself dry with a CLEAN TOWEL.   10. Wear CLEAN PAJAMAS   11. Place CLEAN SHEETS on your bed the night of your first shower and DO NOT SLEEP WITH PETS.    Day of Surgery: Do not apply  any deodorants/lotions. Please wear clean clothes to the hospital/surgery center.      Please read over the following fact sheets that you were given: Incentive spirometry

## 2015-11-07 ENCOUNTER — Encounter (HOSPITAL_COMMUNITY)
Admission: RE | Admit: 2015-11-07 | Discharge: 2015-11-07 | Disposition: A | Discharge status: Home / Self Care | Payer: Medicare Other | Source: Ambulatory Visit | Attending: Orthopedic Surgery | Admitting: Orthopedic Surgery

## 2015-11-07 ENCOUNTER — Encounter (HOSPITAL_COMMUNITY): Payer: Self-pay

## 2015-11-07 ENCOUNTER — Other Ambulatory Visit (HOSPITAL_COMMUNITY): Payer: Self-pay | Admitting: *Deleted

## 2015-11-07 DIAGNOSIS — M1612 Unilateral primary osteoarthritis, left hip: Secondary | ICD-10-CM | POA: Insufficient documentation

## 2015-11-07 DIAGNOSIS — I471 Supraventricular tachycardia: Secondary | ICD-10-CM | POA: Insufficient documentation

## 2015-11-07 DIAGNOSIS — F419 Anxiety disorder, unspecified: Secondary | ICD-10-CM | POA: Diagnosis not present

## 2015-11-07 DIAGNOSIS — K589 Irritable bowel syndrome without diarrhea: Secondary | ICD-10-CM | POA: Diagnosis not present

## 2015-11-07 DIAGNOSIS — E876 Hypokalemia: Secondary | ICD-10-CM | POA: Diagnosis not present

## 2015-11-07 DIAGNOSIS — Z01812 Encounter for preprocedural laboratory examination: Secondary | ICD-10-CM | POA: Diagnosis not present

## 2015-11-07 DIAGNOSIS — F329 Major depressive disorder, single episode, unspecified: Secondary | ICD-10-CM | POA: Insufficient documentation

## 2015-11-07 DIAGNOSIS — E785 Hyperlipidemia, unspecified: Secondary | ICD-10-CM | POA: Insufficient documentation

## 2015-11-07 DIAGNOSIS — J449 Chronic obstructive pulmonary disease, unspecified: Secondary | ICD-10-CM | POA: Insufficient documentation

## 2015-11-07 HISTORY — DX: Irritable bowel syndrome, unspecified: K58.9

## 2015-11-07 LAB — CBC
HEMATOCRIT: 44.6 % (ref 36.0–46.0)
Hemoglobin: 14.4 g/dL (ref 12.0–15.0)
MCH: 33.6 pg (ref 26.0–34.0)
MCHC: 32.3 g/dL (ref 30.0–36.0)
MCV: 104 fL — AB (ref 78.0–100.0)
PLATELETS: 202 10*3/uL (ref 150–400)
RBC: 4.29 MIL/uL (ref 3.87–5.11)
RDW: 12.6 % (ref 11.5–15.5)
WBC: 5 10*3/uL (ref 4.0–10.5)

## 2015-11-07 LAB — COMPREHENSIVE METABOLIC PANEL
ALT: 26 U/L (ref 14–54)
ANION GAP: 10 (ref 5–15)
AST: 29 U/L (ref 15–41)
Albumin: 4 g/dL (ref 3.5–5.0)
Alkaline Phosphatase: 59 U/L (ref 38–126)
BILIRUBIN TOTAL: 0.4 mg/dL (ref 0.3–1.2)
BUN: 16 mg/dL (ref 6–20)
CHLORIDE: 106 mmol/L (ref 101–111)
CO2: 25 mmol/L (ref 22–32)
Calcium: 9.9 mg/dL (ref 8.9–10.3)
Creatinine, Ser: 0.71 mg/dL (ref 0.44–1.00)
Glucose, Bld: 81 mg/dL (ref 65–99)
POTASSIUM: 3.5 mmol/L (ref 3.5–5.1)
Sodium: 141 mmol/L (ref 135–145)
Total Protein: 6.2 g/dL — ABNORMAL LOW (ref 6.5–8.1)

## 2015-11-07 LAB — APTT: aPTT: 29 seconds (ref 24–37)

## 2015-11-07 LAB — TYPE AND SCREEN
ABO/RH(D): O POS
ANTIBODY SCREEN: NEGATIVE

## 2015-11-07 LAB — PROTIME-INR
INR: 1.01 (ref 0.00–1.49)
PROTHROMBIN TIME: 13.5 s (ref 11.6–15.2)

## 2015-11-07 LAB — SURGICAL PCR SCREEN
MRSA, PCR: NEGATIVE
STAPHYLOCOCCUS AUREUS: NEGATIVE

## 2015-11-07 NOTE — Progress Notes (Signed)
Cardiologist Dr Claiborne Billings last seen 10/10/15 (clearance note?) PCP Dr Letha Cape. Pt states she had medical clearance done for surgery. Clearance note requested.  EKG 10/08/15 in EPIC Echo 10/25/15 in EPIC Denies other testing.

## 2015-11-12 NOTE — Progress Notes (Signed)
Anesthesia Chart Review:  Pt is a 65 year old female scheduled for L total hip arthroplasty anterior approach on 11/19/2015 with Edmonia Lynch, MD.   PCP is Gilford Rile, MD. Cardiologist is Shelva Majestic, MD who has cleared pt for surgery.   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 Former smoker. BMI 19. S/p R THA 07/31/14.  Medications include: ASA, atenolol, lipitor, verapamil.   Preoperative labs reviewed.    EKG 10/08/15: NSR. First degree AV block. No significant ST changes. Incomplete RBBB.   Echo 10/25/15:  - Left ventricle: The cavity size was normal. Wall thickness was normal. Systolic function was normal. The estimated ejection fraction was in the range of 60% to 65%. Wall motion was normal; there were no regional wall motion abnormalities.  If no changes, I anticipate pt can proceed with surgery as scheduled.   Willeen Cass, FNP-BC [facility] Short Stay Surgical Center/Anesthesiology Phone: [phone] 11/12/2015 12:21 PM

## 2015-11-18 MED ORDER — TRANEXAMIC ACID 1000 MG/10ML IV SOLN
1000.0000 mg | INTRAVENOUS | Status: AC
  Administered 2015-11-19: 1000 mg via INTRAVENOUS
  Filled 2015-11-18: qty 10

## 2015-11-18 MED ORDER — CEFAZOLIN SODIUM-DEXTROSE 2-4 GM/100ML-% IV SOLN
2.0000 g | INTRAVENOUS | Status: AC
  Administered 2015-11-19: 2 g via INTRAVENOUS
  Filled 2015-11-18: qty 100

## 2015-11-19 ENCOUNTER — Inpatient Hospital Stay (HOSPITAL_COMMUNITY): Payer: Medicare Other | Admitting: Anesthesiology

## 2015-11-19 ENCOUNTER — Encounter (HOSPITAL_COMMUNITY): Payer: Self-pay | Admitting: *Deleted

## 2015-11-19 ENCOUNTER — Encounter (HOSPITAL_COMMUNITY)
Admission: RE | Disposition: A | Discharge status: Home Health | Payer: Self-pay | Source: Ambulatory Visit | Attending: Orthopedic Surgery

## 2015-11-19 ENCOUNTER — Inpatient Hospital Stay (HOSPITAL_COMMUNITY): Payer: Medicare Other

## 2015-11-19 ENCOUNTER — Inpatient Hospital Stay (HOSPITAL_COMMUNITY): Payer: Medicare Other | Admitting: Emergency Medicine

## 2015-11-19 ENCOUNTER — Inpatient Hospital Stay (HOSPITAL_COMMUNITY)
Admission: RE | Admit: 2015-11-19 | Discharge: 2015-11-21 | DRG: 470 | Disposition: A | Discharge status: Home Health | Payer: Medicare Other | Source: Ambulatory Visit | Attending: Orthopedic Surgery | Admitting: Orthopedic Surgery

## 2015-11-19 DIAGNOSIS — M169 Osteoarthritis of hip, unspecified: Secondary | ICD-10-CM | POA: Diagnosis not present

## 2015-11-19 DIAGNOSIS — F419 Anxiety disorder, unspecified: Secondary | ICD-10-CM | POA: Diagnosis present

## 2015-11-19 DIAGNOSIS — Z96642 Presence of left artificial hip joint: Secondary | ICD-10-CM | POA: Diagnosis not present

## 2015-11-19 DIAGNOSIS — M1990 Unspecified osteoarthritis, unspecified site: Secondary | ICD-10-CM | POA: Diagnosis not present

## 2015-11-19 DIAGNOSIS — Z7982 Long term (current) use of aspirin: Secondary | ICD-10-CM

## 2015-11-19 DIAGNOSIS — Z825 Family history of asthma and other chronic lower respiratory diseases: Secondary | ICD-10-CM | POA: Diagnosis not present

## 2015-11-19 DIAGNOSIS — Z888 Allergy status to other drugs, medicaments and biological substances status: Secondary | ICD-10-CM | POA: Diagnosis not present

## 2015-11-19 DIAGNOSIS — Z79899 Other long term (current) drug therapy: Secondary | ICD-10-CM | POA: Diagnosis not present

## 2015-11-19 DIAGNOSIS — I440 Atrioventricular block, first degree: Secondary | ICD-10-CM | POA: Diagnosis present

## 2015-11-19 DIAGNOSIS — Z96641 Presence of right artificial hip joint: Secondary | ICD-10-CM | POA: Diagnosis present

## 2015-11-19 DIAGNOSIS — Z87891 Personal history of nicotine dependence: Secondary | ICD-10-CM | POA: Diagnosis not present

## 2015-11-19 DIAGNOSIS — D62 Acute posthemorrhagic anemia: Secondary | ICD-10-CM | POA: Diagnosis not present

## 2015-11-19 DIAGNOSIS — Z881 Allergy status to other antibiotic agents status: Secondary | ICD-10-CM | POA: Diagnosis not present

## 2015-11-19 DIAGNOSIS — F329 Major depressive disorder, single episode, unspecified: Secondary | ICD-10-CM | POA: Diagnosis present

## 2015-11-19 DIAGNOSIS — I471 Supraventricular tachycardia, unspecified: Secondary | ICD-10-CM | POA: Diagnosis present

## 2015-11-19 DIAGNOSIS — E876 Hypokalemia: Secondary | ICD-10-CM | POA: Diagnosis present

## 2015-11-19 DIAGNOSIS — G8929 Other chronic pain: Secondary | ICD-10-CM | POA: Diagnosis present

## 2015-11-19 DIAGNOSIS — Z8249 Family history of ischemic heart disease and other diseases of the circulatory system: Secondary | ICD-10-CM

## 2015-11-19 DIAGNOSIS — Z23 Encounter for immunization: Secondary | ICD-10-CM | POA: Diagnosis not present

## 2015-11-19 DIAGNOSIS — Z885 Allergy status to narcotic agent status: Secondary | ICD-10-CM | POA: Diagnosis not present

## 2015-11-19 DIAGNOSIS — E785 Hyperlipidemia, unspecified: Secondary | ICD-10-CM | POA: Diagnosis present

## 2015-11-19 DIAGNOSIS — M549 Dorsalgia, unspecified: Secondary | ICD-10-CM | POA: Diagnosis present

## 2015-11-19 DIAGNOSIS — J449 Chronic obstructive pulmonary disease, unspecified: Secondary | ICD-10-CM | POA: Diagnosis present

## 2015-11-19 DIAGNOSIS — M1612 Unilateral primary osteoarthritis, left hip: Principal | ICD-10-CM | POA: Diagnosis present

## 2015-11-19 DIAGNOSIS — Z886 Allergy status to analgesic agent status: Secondary | ICD-10-CM | POA: Diagnosis not present

## 2015-11-19 DIAGNOSIS — I059 Rheumatic mitral valve disease, unspecified: Secondary | ICD-10-CM | POA: Diagnosis present

## 2015-11-19 DIAGNOSIS — Z419 Encounter for procedure for purposes other than remedying health state, unspecified: Secondary | ICD-10-CM

## 2015-11-19 DIAGNOSIS — K589 Irritable bowel syndrome without diarrhea: Secondary | ICD-10-CM | POA: Diagnosis present

## 2015-11-19 DIAGNOSIS — Z471 Aftercare following joint replacement surgery: Secondary | ICD-10-CM | POA: Diagnosis not present

## 2015-11-19 HISTORY — PX: TOTAL HIP ARTHROPLASTY: SHX124

## 2015-11-19 LAB — URINALYSIS, ROUTINE W REFLEX MICROSCOPIC
BILIRUBIN URINE: NEGATIVE
GLUCOSE, UA: NEGATIVE mg/dL
Hgb urine dipstick: NEGATIVE
KETONES UR: NEGATIVE mg/dL
Leukocytes, UA: NEGATIVE
NITRITE: NEGATIVE
PH: 6 (ref 5.0–8.0)
Protein, ur: NEGATIVE mg/dL
SPECIFIC GRAVITY, URINE: 1.025 (ref 1.005–1.030)

## 2015-11-19 SURGERY — ARTHROPLASTY, HIP, TOTAL, ANTERIOR APPROACH
Anesthesia: General | Site: Hip | Laterality: Left

## 2015-11-19 MED ORDER — ACETAMINOPHEN 500 MG PO TABS
1000.0000 mg | ORAL_TABLET | Freq: Once | ORAL | Status: AC
  Administered 2015-11-19: 1000 mg via ORAL
  Filled 2015-11-19: qty 2

## 2015-11-19 MED ORDER — MIDAZOLAM HCL 5 MG/5ML IJ SOLN
INTRAMUSCULAR | Status: DC | PRN
  Administered 2015-11-19: 2 mg via INTRAVENOUS

## 2015-11-19 MED ORDER — BUPIVACAINE-EPINEPHRINE (PF) 0.25% -1:200000 IJ SOLN
INTRAMUSCULAR | Status: AC
  Filled 2015-11-19: qty 30

## 2015-11-19 MED ORDER — BISACODYL 10 MG RE SUPP
10.0000 mg | Freq: Every day | RECTAL | Status: DC | PRN
Start: 2015-11-19 — End: 2015-11-21

## 2015-11-19 MED ORDER — KETOROLAC TROMETHAMINE 30 MG/ML IJ SOLN
INTRAMUSCULAR | Status: AC
  Filled 2015-11-19: qty 2

## 2015-11-19 MED ORDER — PHENYLEPHRINE 40 MCG/ML (10ML) SYRINGE FOR IV PUSH (FOR BLOOD PRESSURE SUPPORT)
PREFILLED_SYRINGE | INTRAVENOUS | Status: AC
  Filled 2015-11-19: qty 10

## 2015-11-19 MED ORDER — ACETAMINOPHEN 650 MG RE SUPP
650.0000 mg | Freq: Four times a day (QID) | RECTAL | Status: DC | PRN
Start: 2015-11-19 — End: 2015-11-21

## 2015-11-19 MED ORDER — LIDOCAINE HCL (CARDIAC) 20 MG/ML IV SOLN
INTRAVENOUS | Status: DC | PRN
  Administered 2015-11-19: 50 mg via INTRATRACHEAL

## 2015-11-19 MED ORDER — SUGAMMADEX SODIUM 200 MG/2ML IV SOLN
INTRAVENOUS | Status: DC | PRN
  Administered 2015-11-19: 100 mg via INTRAVENOUS

## 2015-11-19 MED ORDER — PHENOL 1.4 % MT LIQD: 1.0000 | OROMUCOSAL | Status: DC | PRN

## 2015-11-19 MED ORDER — 0.9 % SODIUM CHLORIDE (POUR BTL) OPTIME
TOPICAL | Status: DC | PRN
  Administered 2015-11-19: 1000 mL

## 2015-11-19 MED ORDER — DEXAMETHASONE SODIUM PHOSPHATE 10 MG/ML IJ SOLN
10.0000 mg | Freq: Once | INTRAMUSCULAR | Status: AC
  Administered 2015-11-20: 10 mg via INTRAVENOUS
  Filled 2015-11-19: qty 1

## 2015-11-19 MED ORDER — DEXTROSE-NACL 5-0.45 % IV SOLN
INTRAVENOUS | Status: DC
  Administered 2015-11-19: 17:00:00 via INTRAVENOUS

## 2015-11-19 MED ORDER — OXYCODONE HCL 5 MG PO TABS
5.0000 mg | ORAL_TABLET | ORAL | Status: DC | PRN
  Administered 2015-11-19 – 2015-11-21 (×12): 10 mg via ORAL
  Filled 2015-11-19 (×11): qty 2

## 2015-11-19 MED ORDER — FENTANYL CITRATE (PF) 100 MCG/2ML IJ SOLN
INTRAMUSCULAR | Status: DC | PRN
  Administered 2015-11-19: 50 ug via INTRAVENOUS
  Administered 2015-11-19 (×2): 100 ug via INTRAVENOUS

## 2015-11-19 MED ORDER — CHLORHEXIDINE GLUCONATE 4 % EX LIQD: 60.0000 mL | Freq: Once | CUTANEOUS | Status: DC

## 2015-11-19 MED ORDER — FENTANYL CITRATE (PF) 100 MCG/2ML IJ SOLN
INTRAMUSCULAR | Status: AC
  Administered 2015-11-19: 50 ug via INTRAVENOUS
  Filled 2015-11-19: qty 2

## 2015-11-19 MED ORDER — RIVAROXABAN 10 MG PO TABS: 10.0000 mg | ORAL_TABLET | Freq: Every day | ORAL | 0 refills | Status: DC

## 2015-11-19 MED ORDER — MIDAZOLAM HCL 2 MG/2ML IJ SOLN
INTRAMUSCULAR | Status: AC
  Filled 2015-11-19: qty 2

## 2015-11-19 MED ORDER — SERTRALINE HCL 50 MG PO TABS
50.0000 mg | ORAL_TABLET | Freq: Every day | ORAL | Status: DC
  Administered 2015-11-19 – 2015-11-21 (×3): 50 mg via ORAL
  Filled 2015-11-19 (×3): qty 1

## 2015-11-19 MED ORDER — DOCUSATE SODIUM 100 MG PO CAPS
100.0000 mg | ORAL_CAPSULE | Freq: Two times a day (BID) | ORAL | Status: DC
  Administered 2015-11-19 – 2015-11-21 (×5): 100 mg via ORAL
  Filled 2015-11-19 (×5): qty 1

## 2015-11-19 MED ORDER — PNEUMOCOCCAL VAC POLYVALENT 25 MCG/0.5ML IJ INJ
0.5000 mL | INJECTION | INTRAMUSCULAR | Status: AC
  Administered 2015-11-21: 0.5 mL via INTRAMUSCULAR
  Filled 2015-11-19 (×2): qty 0.5

## 2015-11-19 MED ORDER — ONDANSETRON HCL 4 MG/2ML IJ SOLN
4.0000 mg | Freq: Four times a day (QID) | INTRAMUSCULAR | Status: DC | PRN
  Administered 2015-11-20: 4 mg via INTRAVENOUS
  Filled 2015-11-19: qty 2

## 2015-11-19 MED ORDER — DIPHENHYDRAMINE HCL 12.5 MG/5ML PO ELIX: 12.5000 mg | ORAL_SOLUTION | ORAL | Status: DC | PRN

## 2015-11-19 MED ORDER — METOCLOPRAMIDE HCL 5 MG PO TABS: 5.0000 mg | ORAL_TABLET | Freq: Three times a day (TID) | ORAL | Status: DC | PRN

## 2015-11-19 MED ORDER — LIDOCAINE 2% (20 MG/ML) 5 ML SYRINGE
INTRAMUSCULAR | Status: AC
  Filled 2015-11-19: qty 15

## 2015-11-19 MED ORDER — HYDROMORPHONE HCL 1 MG/ML IJ SOLN
0.5000 mg | INTRAMUSCULAR | Status: DC | PRN
  Administered 2015-11-19 – 2015-11-20 (×3): 1 mg via INTRAVENOUS
  Administered 2015-11-20: 0.5 mg via INTRAVENOUS
  Administered 2015-11-20 (×2): 1 mg via INTRAVENOUS
  Filled 2015-11-19 (×7): qty 1

## 2015-11-19 MED ORDER — ASPIRIN EC 81 MG PO TBEC
81.0000 mg | DELAYED_RELEASE_TABLET | Freq: Every day | ORAL | Status: DC
  Administered 2015-11-20: 81 mg via ORAL
  Filled 2015-11-19: qty 1

## 2015-11-19 MED ORDER — EPHEDRINE 5 MG/ML INJ
INTRAVENOUS | Status: AC
  Filled 2015-11-19: qty 10

## 2015-11-19 MED ORDER — ONDANSETRON HCL 4 MG/2ML IJ SOLN
INTRAMUSCULAR | Status: DC | PRN
  Administered 2015-11-19: 4 mg via INTRAVENOUS

## 2015-11-19 MED ORDER — ROCURONIUM BROMIDE 100 MG/10ML IV SOLN
INTRAVENOUS | Status: DC | PRN
  Administered 2015-11-19: 40 mg via INTRAVENOUS
  Administered 2015-11-19: 10 mg via INTRAVENOUS

## 2015-11-19 MED ORDER — ACETAMINOPHEN 325 MG PO TABS
650.0000 mg | ORAL_TABLET | Freq: Four times a day (QID) | ORAL | Status: DC | PRN
  Administered 2015-11-20 (×2): 650 mg via ORAL
  Filled 2015-11-19 (×2): qty 2

## 2015-11-19 MED ORDER — OXYCODONE HCL 5 MG PO TABS
ORAL_TABLET | ORAL | Status: AC
  Filled 2015-11-19: qty 2

## 2015-11-19 MED ORDER — OXYCODONE-ACETAMINOPHEN 5-325 MG PO TABS: 1.0000 | ORAL_TABLET | ORAL | 0 refills | Status: DC | PRN

## 2015-11-19 MED ORDER — ONDANSETRON HCL 4 MG PO TABS: 4.0000 mg | ORAL_TABLET | Freq: Four times a day (QID) | ORAL | Status: DC | PRN

## 2015-11-19 MED ORDER — FENTANYL CITRATE (PF) 100 MCG/2ML IJ SOLN
25.0000 ug | INTRAMUSCULAR | Status: DC | PRN
  Administered 2015-11-19 (×3): 50 ug via INTRAVENOUS

## 2015-11-19 MED ORDER — FLEET ENEMA 7-19 GM/118ML RE ENEM: 1.0000 | ENEMA | Freq: Once | RECTAL | Status: DC | PRN

## 2015-11-19 MED ORDER — LACTATED RINGERS IV SOLN
INTRAVENOUS | Status: DC
  Administered 2015-11-19 (×2): via INTRAVENOUS

## 2015-11-19 MED ORDER — FENTANYL CITRATE (PF) 100 MCG/2ML IJ SOLN
INTRAMUSCULAR | Status: AC
  Filled 2015-11-19: qty 2

## 2015-11-19 MED ORDER — CEFAZOLIN SODIUM-DEXTROSE 2-4 GM/100ML-% IV SOLN
2.0000 g | Freq: Four times a day (QID) | INTRAVENOUS | Status: AC
  Administered 2015-11-19 (×2): 2 g via INTRAVENOUS
  Filled 2015-11-19 (×2): qty 100

## 2015-11-19 MED ORDER — SODIUM CHLORIDE FLUSH 0.9 % IV SOLN
INTRAVENOUS | Status: DC | PRN
  Administered 2015-11-19: 30 mL

## 2015-11-19 MED ORDER — PROPOFOL 10 MG/ML IV BOLUS
INTRAVENOUS | Status: DC | PRN
  Administered 2015-11-19: 110 mg via INTRAVENOUS

## 2015-11-19 MED ORDER — DOCUSATE SODIUM 100 MG PO CAPS: 100.0000 mg | ORAL_CAPSULE | Freq: Two times a day (BID) | ORAL | 0 refills | Status: DC

## 2015-11-19 MED ORDER — METOCLOPRAMIDE HCL 5 MG/ML IJ SOLN: 5.0000 mg | Freq: Three times a day (TID) | INTRAMUSCULAR | Status: DC | PRN

## 2015-11-19 MED ORDER — RIVAROXABAN 10 MG PO TABS
10.0000 mg | ORAL_TABLET | Freq: Every day | ORAL | Status: DC
  Administered 2015-11-20 – 2015-11-21 (×2): 10 mg via ORAL
  Filled 2015-11-19 (×2): qty 1

## 2015-11-19 MED ORDER — VERAPAMIL HCL ER 240 MG PO TBCR
240.0000 mg | EXTENDED_RELEASE_TABLET | Freq: Every day | ORAL | Status: DC
  Administered 2015-11-19 – 2015-11-20 (×2): 240 mg via ORAL
  Filled 2015-11-19 (×2): qty 1

## 2015-11-19 MED ORDER — CHLORHEXIDINE GLUCONATE 0.12 % MT SOLN
15.0000 mL | Freq: Two times a day (BID) | OROMUCOSAL | Status: DC
  Administered 2015-11-20 – 2015-11-21 (×3): 15 mL via OROMUCOSAL
  Filled 2015-11-19 (×2): qty 15

## 2015-11-19 MED ORDER — CLONAZEPAM 1 MG PO TABS
1.0000 mg | ORAL_TABLET | Freq: Every day | ORAL | Status: DC
  Administered 2015-11-19 – 2015-11-20 (×2): 1 mg via ORAL
  Filled 2015-11-19 (×2): qty 1

## 2015-11-19 MED ORDER — EPHEDRINE SULFATE 50 MG/ML IJ SOLN
INTRAMUSCULAR | Status: DC | PRN
  Administered 2015-11-19 (×2): 5 mg via INTRAVENOUS
  Administered 2015-11-19: 10 mg via INTRAVENOUS

## 2015-11-19 MED ORDER — FENTANYL CITRATE (PF) 250 MCG/5ML IJ SOLN
INTRAMUSCULAR | Status: AC
  Filled 2015-11-19: qty 5

## 2015-11-19 MED ORDER — ONDANSETRON HCL 4 MG PO TABS: 4.0000 mg | ORAL_TABLET | Freq: Three times a day (TID) | ORAL | 0 refills | Status: DC | PRN

## 2015-11-19 MED ORDER — SUCCINYLCHOLINE CHLORIDE 200 MG/10ML IV SOSY
PREFILLED_SYRINGE | INTRAVENOUS | Status: AC
  Filled 2015-11-19: qty 30

## 2015-11-19 MED ORDER — KETOROLAC TROMETHAMINE 30 MG/ML IJ SOLN
INTRAMUSCULAR | Status: DC | PRN
  Administered 2015-11-19: 30 mg

## 2015-11-19 MED ORDER — ATENOLOL 12.5 MG HALF TABLET
12.5000 mg | ORAL_TABLET | Freq: Two times a day (BID) | ORAL | Status: DC
  Administered 2015-11-19 – 2015-11-21 (×4): 12.5 mg via ORAL
  Filled 2015-11-19 (×4): qty 1

## 2015-11-19 MED ORDER — SUGAMMADEX SODIUM 200 MG/2ML IV SOLN
INTRAVENOUS | Status: AC
  Filled 2015-11-19: qty 2

## 2015-11-19 MED ORDER — PHENYLEPHRINE HCL 10 MG/ML IJ SOLN
INTRAMUSCULAR | Status: DC | PRN
  Administered 2015-11-19: 80 ug via INTRAVENOUS

## 2015-11-19 MED ORDER — BUPIVACAINE-EPINEPHRINE 0.25% -1:200000 IJ SOLN
INTRAMUSCULAR | Status: DC | PRN
  Administered 2015-11-19: 30 mL

## 2015-11-19 MED ORDER — MENTHOL 3 MG MT LOZG: 1.0000 | LOZENGE | OROMUCOSAL | Status: DC | PRN

## 2015-11-19 MED ORDER — BACLOFEN 10 MG PO TABS
20.0000 mg | ORAL_TABLET | Freq: Three times a day (TID) | ORAL | Status: DC | PRN
  Administered 2015-11-20 – 2015-11-21 (×3): 20 mg via ORAL
  Filled 2015-11-19 (×3): qty 2

## 2015-11-19 MED ORDER — ONDANSETRON HCL 4 MG/2ML IJ SOLN
INTRAMUSCULAR | Status: AC
  Filled 2015-11-19: qty 2

## 2015-11-19 MED ORDER — ATORVASTATIN CALCIUM 10 MG PO TABS
5.0000 mg | ORAL_TABLET | Freq: Every day | ORAL | Status: DC
  Administered 2015-11-19 – 2015-11-20 (×2): 5 mg via ORAL
  Filled 2015-11-19 (×2): qty 1

## 2015-11-19 SURGICAL SUPPLY — 58 items
BAG DECANTER FOR FLEXI CONT (MISCELLANEOUS) IMPLANT
BENZOIN TINCTURE PRP APPL 2/3 (GAUZE/BANDAGES/DRESSINGS) ×2 IMPLANT
BLADE SAG 18X100X1.27 (BLADE) IMPLANT
BLADE SAW SGTL 18X1.27X75 (BLADE) ×2 IMPLANT
BLADE SURG ROTATE 9660 (MISCELLANEOUS) IMPLANT
CAPT HIP TOTAL 2 ×2 IMPLANT
CLSR STERI-STRIP ANTIMIC 1/2X4 (GAUZE/BANDAGES/DRESSINGS) ×2 IMPLANT
COVER PERINEAL POST (MISCELLANEOUS) ×2 IMPLANT
COVER SURGICAL LIGHT HANDLE (MISCELLANEOUS) ×4 IMPLANT
DRAPE C-ARM 42X72 X-RAY (DRAPES) ×2 IMPLANT
DRAPE STERI IOBAN 125X83 (DRAPES) ×2 IMPLANT
DRAPE U-SHAPE 47X51 STRL (DRAPES) ×4 IMPLANT
DRSG MEPILEX BORDER 4X8 (GAUZE/BANDAGES/DRESSINGS) ×2 IMPLANT
DURAPREP 26ML APPLICATOR (WOUND CARE) ×2 IMPLANT
ELECT BLADE 4.0 EZ CLEAN MEGAD (MISCELLANEOUS) ×2
ELECT REM PT RETURN 9FT ADLT (ELECTROSURGICAL) ×2
ELECTRODE BLDE 4.0 EZ CLN MEGD (MISCELLANEOUS) ×1 IMPLANT
ELECTRODE REM PT RTRN 9FT ADLT (ELECTROSURGICAL) ×1 IMPLANT
FACESHIELD WRAPAROUND (MASK) ×4 IMPLANT
GLOVE BIO SURGEON STRL SZ7.5 (GLOVE) ×4 IMPLANT
GLOVE BIOGEL PI IND STRL 6 (GLOVE) ×1 IMPLANT
GLOVE BIOGEL PI IND STRL 6.5 (GLOVE) ×1 IMPLANT
GLOVE BIOGEL PI IND STRL 8 (GLOVE) ×3 IMPLANT
GLOVE BIOGEL PI INDICATOR 6 (GLOVE) ×1
GLOVE BIOGEL PI INDICATOR 6.5 (GLOVE) ×1
GLOVE BIOGEL PI INDICATOR 8 (GLOVE) ×3
GLOVE ECLIPSE 7.5 STRL STRAW (GLOVE) ×2 IMPLANT
GLOVE SURG SS PI 6.0 STRL IVOR (GLOVE) ×2 IMPLANT
GLOVE SURG SS PI 6.5 STRL IVOR (GLOVE) ×2 IMPLANT
GOWN STRL REUS W/ TWL LRG LVL3 (GOWN DISPOSABLE) ×4 IMPLANT
GOWN STRL REUS W/ TWL XL LVL3 (GOWN DISPOSABLE) ×1 IMPLANT
GOWN STRL REUS W/TWL LRG LVL3 (GOWN DISPOSABLE) ×4
GOWN STRL REUS W/TWL XL LVL3 (GOWN DISPOSABLE) ×1
HEAD CERAMIC FEMORAL 36MM (Head) ×2 IMPLANT
KIT BASIN OR (CUSTOM PROCEDURE TRAY) ×2 IMPLANT
KIT ROOM TURNOVER OR (KITS) ×2 IMPLANT
MANIFOLD NEPTUNE II (INSTRUMENTS) ×2 IMPLANT
NDL SAFETY ECLIPSE 18X1.5 (NEEDLE) IMPLANT
NEEDLE 18GX1X1/2 (RX/OR ONLY) (NEEDLE) ×2 IMPLANT
NEEDLE HYPO 18GX1.5 SHARP (NEEDLE)
NS IRRIG 1000ML POUR BTL (IV SOLUTION) ×2 IMPLANT
PACK TOTAL JOINT (CUSTOM PROCEDURE TRAY) ×2 IMPLANT
PAD ARMBOARD 7.5X6 YLW CONV (MISCELLANEOUS) ×2 IMPLANT
SLEEVE SURGEON STRL (DRAPES) ×2 IMPLANT
SPONGE LAP 18X18 X RAY DECT (DISPOSABLE) IMPLANT
STRIP CLOSURE SKIN 1/2X4 (GAUZE/BANDAGES/DRESSINGS) ×2 IMPLANT
SUT MNCRL AB 4-0 PS2 18 (SUTURE) ×2 IMPLANT
SUT MON AB 2-0 CT1 36 (SUTURE) ×2 IMPLANT
SUT VIC AB 0 CT1 27 (SUTURE) ×1
SUT VIC AB 0 CT1 27XBRD ANBCTR (SUTURE) ×1 IMPLANT
SUT VIC AB 1 CT1 27 (SUTURE) ×1
SUT VIC AB 1 CT1 27XBRD ANBCTR (SUTURE) ×1 IMPLANT
SYR 50ML LL SCALE MARK (SYRINGE) ×2 IMPLANT
SYRINGE 20CC LL (MISCELLANEOUS) IMPLANT
TOWEL OR 17X24 6PK STRL BLUE (TOWEL DISPOSABLE) ×2 IMPLANT
TOWEL OR 17X26 10 PK STRL BLUE (TOWEL DISPOSABLE) ×2 IMPLANT
TRAY FOLEY CATH 16FRSI W/METER (SET/KITS/TRAYS/PACK) IMPLANT
YANKAUER SUCT BULB TIP NO VENT (SUCTIONS) ×2 IMPLANT

## 2015-11-19 NOTE — Evaluation (Signed)
Physical Therapy Evaluation Patient Details Name: [name] MRN: [id number] DOB: 05-25-1950 Today's Date: 11/19/2015   History of Present Illness  Pt admitted 7/25 for elective L anterior THA. PSH: R THA and 14 neck/back surgeries  Clinical Impression  *Pt is s/p THA resulting in the deficits listed below (see PT Problem List). Pt with previous hip surgery and demo's good understanding of transfer and ambulation techniques. Pt will benefit from skilled PT to increase their independence and safety with mobility to allow discharge to the venue listed below.      Follow Up Recommendations Home health PT;Supervision/Assistance - 24 hour    Equipment Recommendations  None recommended by PT    Recommendations for Other Services       Precautions / Restrictions Precautions Precautions: Anterior Hip (unsure if MD wants, no order and doesn't say direct) Precaution Booklet Issued:  (exercise handout provided) Restrictions Weight Bearing Restrictions: Yes LLE Weight Bearing: Weight bearing as tolerated      Mobility  Bed Mobility Overal bed mobility: Needs Assistance Bed Mobility: Supine to Sit     Supine to sit: Supervision     General bed mobility comments: pt able to manage L LE , increased time, long sit technique  Transfers Overall transfer level: Needs assistance Equipment used: Rolling walker (2 wheeled) Transfers: Sit to/from Stand Sit to Stand: Supervision         General transfer comment: v/c's for hand placement, increased time  Ambulation/Gait Ambulation/Gait assistance: Min guard Ambulation Distance (Feet): 20 Feet Assistive device: Rolling walker (2 wheeled) Gait Pattern/deviations: Step-to pattern;Decreased stride length;Decreased dorsiflexion - left;Decreased step length - left;Decreased stance time - left Gait velocity: decreased Gait velocity interpretation: <1.8 ft/sec, indicative of risk for recurrent falls General Gait Details: pt with  decreased foot clearance, increased L hip pain, decreased L LE WBing  Stairs            Wheelchair Mobility    Modified Rankin (Stroke Patients Only)       Balance Overall balance assessment: Needs assistance Sitting-balance support: Feet supported;No upper extremity supported Sitting balance-Leahy Scale: Fair Sitting balance - Comments: pt sat eob and donned pants   Standing balance support: No upper extremity supported Standing balance-Leahy Scale: Fair Standing balance comment: pt able to pull pants up                             Pertinent Vitals/Pain Pain Assessment: 0-10 Pain Score: 5  Pain Location: L hip upon PT arrival, 7/10 s/p amb/ther ex Pain Descriptors / Indicators: Operative site guarding Pain Intervention(s): Monitored during session    Home Living Family/patient expects to be discharged to:: Private residence Living Arrangements: Spouse/significant other Available Help at Discharge: Family;Available 24 hours/day Type of Home: House Home Access: Level entry     Home Layout: Multi-level Home Equipment: Walker - 2 wheels;Walker - standard;Bedside commode;Shower seat;Hand held shower head      Prior Function Level of Independence: Independent               Hand Dominance   Dominant Hand: Right    Extremity/Trunk Assessment   Upper Extremity Assessment: Overall WFL for tasks assessed           Lower Extremity Assessment: LLE deficits/detail   LLE Deficits / Details: pt able to complete LAQ and quad set, no MMT due to surgery   Cervical / Trunk Assessment: Normal  Communication   Communication: No difficulties  Cognition Arousal/Alertness: Awake/alert Behavior During Therapy: WFL for tasks assessed/performed Overall Cognitive Status: Within Functional Limits for tasks assessed                      General Comments      Exercises Total Joint Exercises Ankle Circles/Pumps: AROM;Both;10 reps Quad Sets:  AROM;Both;5 reps Gluteal Sets: AROM;Both;10 reps Long Arc Quad: AROM;Left;5 reps;Seated      Assessment/Plan    PT Assessment Patient needs continued PT services  PT Diagnosis Difficulty walking;Acute pain   PT Problem List Decreased strength;Decreased range of motion;Decreased activity tolerance;Decreased balance;Decreased mobility;Decreased knowledge of use of DME  PT Treatment Interventions DME instruction;Gait training;Stair training;Functional mobility training;Therapeutic activities;Therapeutic exercise;Balance training   PT Goals (Current goals can be found in the Care Plan section) Acute Rehab PT Goals Patient Stated Goal: home PT Goal Formulation: With patient Time For Goal Achievement: 11/26/15 Potential to Achieve Goals: Good    Frequency 7X/week   Barriers to discharge        Co-evaluation               End of Session Equipment Utilized During Treatment: Gait belt Activity Tolerance: Patient tolerated treatment well Patient left: in chair;with call bell/phone within reach;with family/visitor present Nurse Communication: Mobility status         Time: [id number] PT Time Calculation (min) (ACUTE ONLY): 36 min   Charges:   PT Evaluation $PT Eval Moderate Complexity: 1 Procedure PT Treatments $Gait Training: 8-22 mins   PT G CodesKingsley [name] 11/19/2015, 4:57 PM   [name], PT, DPT Pager #: [phone] Office #: [phone]

## 2015-11-19 NOTE — Transfer of Care (Signed)
Immediate Anesthesia Transfer of Care Note  Patient: [name]  Procedure(s) Performed: Procedure(s): TOTAL HIP ARTHROPLASTY ANTERIOR APPROACH (Left)  Patient Location: PACU  Anesthesia Type:General  Level of Consciousness: awake, alert , oriented and patient cooperative  Airway & Oxygen Therapy: Patient Spontanous Breathing and Patient connected to nasal cannula oxygen  Post-op Assessment: Report given to RN and Post -op Vital signs reviewed and stable  Post vital signs: Reviewed and stable  Last Vitals:  Vitals:   11/19/15 0806 11/19/15 1215  BP: (!) 124/47   Pulse: 75 77  Resp: 18 18  Temp: 37.2 C 36.6 C    Last Pain:  Vitals:   11/19/15 0813  TempSrc:   PainSc: 4          Complications: No apparent anesthesia complications

## 2015-11-19 NOTE — Anesthesia Procedure Notes (Signed)
Procedure Name: Intubation Date/Time: 11/19/2015 10:28 AM Performed by: Shirlyn Goltz Pre-anesthesia Checklist: Patient identified, Emergency Drugs available, Suction available and Patient being monitored Patient Re-evaluated:Patient Re-evaluated prior to inductionOxygen Delivery Method: Circle system utilized Preoxygenation: Pre-oxygenation with 100% oxygen Intubation Type: IV induction Ventilation: Mask ventilation without difficulty Laryngoscope Size: Mac and 3 Grade View: Grade I Tube type: Oral Tube size: 7.0 mm Number of attempts: 1 Airway Equipment and Method: Stylet and LTA kit utilized Placement Confirmation: positive ETCO2,  ETT inserted through vocal cords under direct vision and breath sounds checked- equal and bilateral Secured at: 20 cm Tube secured with: Tape Dental Injury: Teeth and Oropharynx as per pre-operative assessment

## 2015-11-19 NOTE — Op Note (Signed)
11/19/2015  11:36 AM  PATIENT:  [name]   MRN: [id number]  PRE-OPERATIVE DIAGNOSIS:  OA LEFT HIP  POST-OPERATIVE DIAGNOSIS:  OA LEFT HIP  PROCEDURE:  Procedure(s): TOTAL HIP ARTHROPLASTY ANTERIOR APPROACH  PREOPERATIVE INDICATIONS:    [name] is an 65 y.o. female who has a diagnosis of Primary osteoarthritis of left hip and elected for surgical management after failing conservative treatment.  The risks benefits and alternatives were discussed with the patient including but not limited to the risks of nonoperative treatment, versus surgical intervention including infection, bleeding, nerve injury, periprosthetic fracture, the need for revision surgery, dislocation, leg length discrepancy, blood clots, cardiopulmonary complications, morbidity, mortality, among others, and they were willing to proceed.     OPERATIVE REPORT     SURGEON:   Hamzah Savoca, Ernesta Amble, MD    ASSISTANT:  Roxan Hockey, PA-C, he was present and scrubbed throughout the case, critical for completion in a timely fashion, and for retraction, instrumentation, and closure.     ANESTHESIA:  General    COMPLICATIONS:  None.     COMPONENTS:  Stryker acolade fit femur size 3 with a 36 mm +0 head ball and a PSL acetabular shell size 48 with a  polyethylene liner    PROCEDURE IN DETAIL:   The patient was met in the holding area and  identified.  The appropriate hip was identified and marked at the operative site.  The patient was then transported to the OR  and  placed under anesthesia per that record.  At that point, the patient was  placed in the supine position and  secured to the operating room table and all bony prominences padded. He received pre-operative antibiotics    The operative lower extremity was prepped from the iliac crest to the distal leg.  Sterile draping was performed.  Time out was performed prior to incision.      Skin incision was made just 2 cm lateral to the ASIS  extending in line  with the tensor fascia lata. Electrocautery was used to control all bleeders. I dissected down sharply to the fascia of the tensor fascia lata was confirmed that the muscle fibers beneath were running posteriorly. I then incised the fascia over the superficial tensor fascia lata in line with the incision. The fascia was elevated off the anterior aspect of the muscle the muscle was retracted posteriorly and protected throughout the case. I then used electrocautery to incise the tensor fascia lata fascia control and all bleeders. Immediately visible was the fat over top of the anterior neck and capsule.  I removed the anterior fat from the capsule and elevated the rectus muscle off of the anterior capsule. I then removed a large time of capsule. The retractors were then placed over the anterior acetabulum as well as around the superior and inferior neck.  I then removed a section of the femoral neck and a napkin ring fashion. Then used the power course to remove the femoral head from the acetabulum and thoroughly irrigated the acetabulum. I sized the femoral head.    I then exposed the deep acetabulum, cleared out any tissue including the ligamentum teres.   After adequate visualization, I excised the labrum, and then sequentially reamed.  I then impacted the acetabular implant into place using fluoroscopy for guidance.  Appropriate version and inclination was confirmed clinically matching their bony anatomy, and with fluoroscopy.  I placed a 55m screw in the posterior/superio position with an excellent bite.  I then placed the polyethylene liner in place  I then adducted the leg and released the external rotators from the posterior femur allowing it to be easily delivered up lateral and anterior to the acetabulum for preparation of the femoral canal.    I then prepared the proximal femur using the cookie-cutter and then sequentially reamed and broached.  A trial broach, neck, and head was utilized,  and I reduced the hip and used floroscopy to assess the neck length and femoral implant.  I then impacted the femoral prosthesis into place into the appropriate version. The hip was then reduced and fluoroscopy confirmed appropriate position. Leg lengths were restored.  I then irrigated the hip copiously again with, and repaired the fascia with Vicryl, followed by monocryl for the subcutaneous tissue, Monocryl for the skin, Steri-Strips and sterile gauze. The patient was then awakened and returned to PACU in stable and satisfactory condition. There were no complications.  POST OPERATIVE PLAN: WBAT, DVT px: SCD's/TED and Xarelto  Edmonia Lynch, MD Orthopedic Surgeon [phone]

## 2015-11-19 NOTE — Interval H&P Note (Signed)
History and Physical Interval Note:  11/19/2015 9:14 AM  [name]  has presented today for surgery, with the diagnosis of OA LEFT HIP  The various methods of treatment have been discussed with the patient and family. After consideration of risks, benefits and other options for treatment, the patient has consented to  Procedure(s): TOTAL HIP ARTHROPLASTY ANTERIOR APPROACH (Left) as a surgical intervention .  The patient's history has been reviewed, patient examined, no change in status, stable for surgery.  I have reviewed the patient's chart and labs.  Questions were answered to the patient's satisfaction.     Shatana Saxton D

## 2015-11-19 NOTE — Anesthesia Preprocedure Evaluation (Signed)
Anesthesia Evaluation  Patient identified by MRN, date of birth, ID band Patient awake    Reviewed: Allergy & Precautions, H&P , Patient's Chart, lab work & pertinent test results, reviewed documented beta blocker date and time   Airway Mallampati: II  TM Distance: >3 FB Neck ROM: full    Dental no notable dental hx.    Pulmonary former smoker,    Pulmonary exam normal breath sounds clear to auscultation       Cardiovascular  Rhythm:regular Rate:Normal     Neuro/Psych    GI/Hepatic   Endo/Other    Renal/GU      Musculoskeletal   Abdominal   Peds  Hematology   Anesthesia Other Findings   Reproductive/Obstetrics                             Anesthesia Physical Anesthesia Plan  ASA: II  Anesthesia Plan: General   Post-op Pain Management:    Induction: Intravenous  Airway Management Planned: Oral ETT  Additional Equipment:   Intra-op Plan:   Post-operative Plan: Extubation in OR  Informed Consent: I have reviewed the patients History and Physical, chart, labs and discussed the procedure including the risks, benefits and alternatives for the proposed anesthesia with the patient or authorized representative who has indicated his/her understanding and acceptance.   Dental Advisory Given and Dental advisory given  Plan Discussed with: CRNA and Surgeon  Anesthesia Plan Comments: (  Discussed general anesthesia, including possible nausea, instrumentation of airway, sore throat,pulmonary aspiration, etc. I asked if the were any outstanding questions, or  concerns before we proceeded.)        Anesthesia Quick Evaluation  

## 2015-11-19 NOTE — Progress Notes (Signed)
Orthopedic Tech Progress Note Patient Details:  [name] May 25, 1950 [id number]  Ortho Devices Ortho Device/Splint Location: Trapeze bar Ortho Device/Splint Interventions: Application   [name] 11/19/2015, 4:13 PM

## 2015-11-20 ENCOUNTER — Encounter (HOSPITAL_COMMUNITY): Payer: Self-pay | Admitting: General Practice

## 2015-11-20 DIAGNOSIS — D62 Acute posthemorrhagic anemia: Secondary | ICD-10-CM

## 2015-11-20 LAB — CBC
HCT: 37.2 % (ref 36.0–46.0)
Hemoglobin: 11.8 g/dL — ABNORMAL LOW (ref 12.0–15.0)
MCH: 33.1 pg (ref 26.0–34.0)
MCHC: 31.7 g/dL (ref 30.0–36.0)
MCV: 104.2 fL — ABNORMAL HIGH (ref 78.0–100.0)
PLATELETS: 159 10*3/uL (ref 150–400)
RBC: 3.57 MIL/uL — AB (ref 3.87–5.11)
RDW: 13 % (ref 11.5–15.5)
WBC: 6.6 10*3/uL (ref 4.0–10.5)

## 2015-11-20 LAB — BASIC METABOLIC PANEL
Anion gap: 9 (ref 5–15)
BUN: 13 mg/dL (ref 6–20)
CO2: 28 mmol/L (ref 22–32)
Calcium: 9 mg/dL (ref 8.9–10.3)
Chloride: 104 mmol/L (ref 101–111)
Creatinine, Ser: 0.77 mg/dL (ref 0.44–1.00)
GFR calc Af Amer: >60 mL/min (ref >60)
Glucose, Bld: 91 mg/dL (ref 65–99)
POTASSIUM: 3.3 mmol/L — AB (ref 3.5–5.1)
SODIUM: 141 mmol/L (ref 135–145)

## 2015-11-20 MED ORDER — POTASSIUM CHLORIDE CRYS ER 20 MEQ PO TBCR
20.0000 meq | EXTENDED_RELEASE_TABLET | Freq: Two times a day (BID) | ORAL | Status: AC
  Administered 2015-11-20 (×2): 20 meq via ORAL
  Filled 2015-11-20 (×2): qty 1

## 2015-11-20 MED ORDER — HYDROMORPHONE HCL 1 MG/ML IJ SOLN
0.5000 mg | INTRAMUSCULAR | Status: DC | PRN
  Administered 2015-11-20 – 2015-11-21 (×3): 0.5 mg via INTRAVENOUS
  Filled 2015-11-20 (×3): qty 1

## 2015-11-20 NOTE — Evaluation (Signed)
Occupational Therapy Evaluation Patient Details Name: [name] MRN: [id number] DOB: May 02, 1950 Today's Date: 11/20/2015    History of Present Illness Pt admitted 7/25 for elective L anterior THA. PSH: R THA and 14 neck/back surgeries   Clinical Impression   Pt was admitted for the above. All education was completed. No further OT needs at this time    Follow Up Recommendations  Supervision/Assistance - 24 hour;No OT follow up    Equipment Recommendations  None recommended by OT    Recommendations for Other Services       Precautions / Restrictions Precautions Precautions: Anterior Hip Precaution Comments: no orders regarding precautions, but no indication surgery was direct so reviewed anterior hip precautions to be safe Restrictions Weight Bearing Restrictions: Yes LLE Weight Bearing: Weight bearing as tolerated      Mobility Bed Mobility               General bed mobility comments: sitting EOB  Transfers Overall transfer level: Needs assistance Equipment used: Rolling walker (2 wheeled) Transfers: Sit to/from Stand Sit to Stand: Supervision         General transfer comment: good use of hands    Balance             Standing balance-Leahy Scale: Fair                              ADL Overall ADL's : Needs assistance/impaired             Lower Body Bathing: Minimal assistance;Sit to/from stand       Lower Body Dressing: Minimal assistance;Sit to/from stand   Toilet Transfer: Supervision/safety;Ambulation;BSC;RW             General ADL Comments: reviewed anterior precautions. Pt has assist as needed at home and has all DME.     Vision     Perception     Praxis      Pertinent Vitals/Pain Pain Assessment: 0-10 Pain Score: 5  Pain Location: L hip Pain Descriptors / Indicators: Sore Pain Intervention(s): Limited activity within patient's tolerance;Monitored during session;Premedicated before  session;Repositioned     Hand Dominance     Extremity/Trunk Assessment Upper Extremity Assessment Upper Extremity Assessment: Overall WFL for tasks assessed           Communication Communication Communication: No difficulties   Cognition Arousal/Alertness: Awake/alert Behavior During Therapy: WFL for tasks assessed/performed Overall Cognitive Status: Within Functional Limits for tasks assessed                     General Comments       Exercises       Shoulder Instructions      Home Living Family/patient expects to be discharged to:: Private residence Living Arrangements: Spouse/significant other Available Help at Discharge: Family;Available 24 hours/day Type of Home: House             Bathroom Shower/Tub: Walk-in Human resources officer: Standard     Home Equipment: Environmental consultant - 2 wheels;Walker - standard;Bedside commode;Shower seat;Hand held shower head          Prior Functioning/Environment Level of Independence: Independent             OT Diagnosis: Acute pain   OT Problem List:     OT Treatment/Interventions:      OT Goals(Current goals can be found in the care plan section) Acute Rehab OT Goals Patient Stated  Goal: home  OT Frequency:     Barriers to D/C:            Co-evaluation              End of Session    Activity Tolerance: Patient tolerated treatment well Patient left: in bed;with call bell/phone within reach   Time: 1610-9604 OT Time Calculation (min): 17 min Charges:  OT General Charges $OT Visit: 1 Procedure OT Evaluation $OT Eval Low Complexity: 1 Procedure G-Codes:    Jaycee Mckellips 11/30/15, 3:07 PM   Marica Otter, OTR/L [phone] November 30, 2015

## 2015-11-20 NOTE — Progress Notes (Signed)
Physical Therapy Treatment Patient Details Name: [name] MRN: [id number] DOB: 1951/02/26 Today's Date: 11/20/2015    History of Present Illness Pt admitted 7/25 for elective L anterior THA. PSH: R THA and 14 neck/back surgeries    PT Comments    Pt did well with stairs using sideways technique.  During gait, pt reports feeling like L LE is longer.  During stance phase with hips even her R foot was off of floor.  Discussed possibilities of muscle tightening and inflammation in pelvis at this time. Will have pt ambulate with shoe on R in afternoon and pt requesting measurement of leg length as well.  RN notified.  Follow Up Recommendations  Home health PT;Supervision/Assistance - 24 hour     Equipment Recommendations  None recommended by PT    Recommendations for Other Services       Precautions / Restrictions Precautions Precautions: Anterior Hip Precaution Comments: no orders regarding precautions, but no indication surgery was direct so reviewed anterior hip precautions to be safe Restrictions Weight Bearing Restrictions: Yes LLE Weight Bearing: Weight bearing as tolerated    Mobility  Bed Mobility               General bed mobility comments: sitting up in recliner upon arrival  Transfers Overall transfer level: Needs assistance Equipment used: Rolling walker (2 wheeled) Transfers: Sit to/from Stand Sit to Stand: Supervision         General transfer comment: good use of hands  Ambulation/Gait Ambulation/Gait assistance: Min guard Ambulation Distance (Feet): 120 Feet Assistive device: Rolling walker (2 wheeled) Gait Pattern/deviations: Step-through pattern Gait velocity: decreased   General Gait Details: Increased L knee flexion during swing through.  Pt reported feeling like her L LE was longer and upon further assessment when both hips even during stance at RW R foot is off of the ground.    Stairs Stairs: Yes Stairs assistance: Min  guard Stair Management: One rail Left;Sideways;Step to pattern Number of Stairs: 2 (x 2) General stair comments: Cues for sequencing and foot placement  Wheelchair Mobility    Modified Rankin (Stroke Patients Only)       Balance Overall balance assessment: Needs assistance           Standing balance-Leahy Scale: Fair Standing balance comment: Able to stand without UE assist                    Cognition Arousal/Alertness: Awake/alert Behavior During Therapy: WFL for tasks assessed/performed Overall Cognitive Status: Within Functional Limits for tasks assessed                      Exercises Total Joint Exercises Ankle Circles/Pumps: AROM;Both;10 reps Quad Sets: AROM;10 reps;Left Gluteal Sets: AROM;Both;10 reps Heel Slides: AROM;Left;5 reps Long Arc Quad: AROM;Left;5 reps;Seated    General Comments General comments (skin integrity, edema, etc.): Pt appears frustrated about her feeling like L LE is longer      Pertinent Vitals/Pain Pain Assessment: 0-10 Pain Score: 7  Pain Location: L hip (with exercises) Pain Descriptors / Indicators: Grimacing Pain Intervention(s): Monitored during session;Limited activity within patient's tolerance;Repositioned;Ice applied    Home Living                      Prior Function            PT Goals (current goals can now be found in the care plan section) Acute Rehab PT Goals Patient Stated Goal: home  PT Goal Formulation: With patient Time For Goal Achievement: 11/26/15 Potential to Achieve Goals: Good Progress towards PT goals: Progressing toward goals    Frequency  7X/week    PT Plan Current plan remains appropriate    Co-evaluation             End of Session Equipment Utilized During Treatment: Gait belt Activity Tolerance: Patient tolerated treatment well Patient left: in chair;with call bell/phone within reach;with family/visitor present     Time: 5784-6962 PT Time Calculation  (min) (ACUTE ONLY): 30 min  Charges:  $Gait Training: 8-22 mins $Therapeutic Exercise: 8-22 mins                    G Codes:      Kendrix Orman LUBECK 11/20/2015, 10:30 AM

## 2015-11-20 NOTE — Care Management Note (Signed)
Case Management Note  Patient Details  Name: MILENY ANNEN MRN: [id number] Date of Birth: April 30, 1950  Subjective/Objective:   65 yr old female s/p left total hip arthroplasty.                 Action/Plan: Case manager spoke with patient concerning Home Health needs and DME. Patient states she has been setup with [facility]. CM Called to confirm. Faxed orders, demographics and op note to [facility] @ [facility]. Patient states she has rolling walker and 3in1, will have family support at discharge.   Expected Discharge Date:   11/21/15               Expected Discharge Plan:  [place]  In-House Referral:     Discharge planning Services  CM Consult  Post Acute Care Choice:  Home Health Choice offered to:  Patient  DME Arranged:  N/A DME Agency:  NA  HH Arranged:  PT [place] Agency:  [facility]  Status of Service:  Completed, signed off  If discussed at [place] of Stay Meetings, dates discussed:    Additional Comments:  Ninfa Meeker, RN 11/20/2015, 11:29 AM

## 2015-11-20 NOTE — Care Management Important Message (Signed)
Important Message  Patient Details  Name: [name] MRN: [id number] Date of Birth: 03/20/1951   Medicare Important Message Given:  Yes    Loann Quill 11/20/2015, 1:16 PM

## 2015-11-20 NOTE — Progress Notes (Signed)
   Assessment: 1 Day Post-Op  S/P Procedure(s) (LRB): TOTAL HIP ARTHROPLASTY ANTERIOR APPROACH (Left) by Dr. Ernesta Amble. Percell Miller on 11/19/15  Principal Problem:   Primary osteoarthritis of left hip Active Problems:   Mitral valve disorder   COPD (chronic obstructive pulmonary disease) (HCC)   History of tobacco abuse   SVT (supraventricular tachycardia) (HCC)   Hyperlipidemia   DJD (degenerative joint disease)   First degree AV block   Acute blood loss anemia  - likely with dilutional component.    Hypokalemia    Plan: Advance diet, continue fluids till taking adequate PO Up with therapy Discharge home with home health when pain under better control. Transition to PO meds when able Follow labs  Weight Bearing: Weight Bearing as Tolerated (WBAT) left leg Dressings: PRN. Mepilex VTE prophylaxis: Xarelto, SCDs, ambulation Dispo: Home -  Likely 1-2 days  Subjective: Patient reports pain as moderate to severe. Pain controlled with IV and PO meds.  Tolerating little PO so far.  Urinating.  +Flatus.  No CP, SOB.  OOB in room.  Objective:   VITALS:   Vitals:   11/19/15 1440 11/19/15 1951 11/20/15 0038 11/20/15 0524  BP: (!) 127/58 (!) 108/38 118/64 (!) 122/36  Pulse: 72 73 87 84  Resp: 16 18 16 16   Temp: 97.7 F (36.5 C) 97.8 F (36.6 C) 99 F (37.2 C) 98.7 F (37.1 C)  TempSrc: Oral Oral Oral Oral  SpO2: 99% 98% 96% 90%  Weight:      Height:       Labs cbc  Recent Labs  11/20/15 0651  WBC 6.6  HGB 11.8*  HCT 37.2  PLT 159     Recent Labs  11/20/15 0651  NA 141  K 3.3*  CL 104  CO2 28  GLUCOSE 91  BUN 13  CREATININE 0.77  CALCIUM 9.0    Physical Exam General: NAD.  Supine in bed smiling, conversant. Neurologically intact ABD soft Neurovascular intact Sensation intact distally Dorsiflexion/Plantar flexion intact Incision: scant drainage   Prudencio Burly III 11/20/2015, 7:49 AM

## 2015-11-20 NOTE — Anesthesia Postprocedure Evaluation (Signed)
Anesthesia Post Note  Patient: Shireen A Bloom  Procedure(s) Performed: Procedure(s) (LRB): TOTAL HIP ARTHROPLASTY ANTERIOR APPROACH (Left)  Patient location during evaluation: PACU Anesthesia Type: General Level of consciousness: sedated Pain management: satisfactory to patient Vital Signs Assessment: post-procedure vital signs reviewed and stable Respiratory status: spontaneous breathing Cardiovascular status: stable Anesthetic complications: no    Last Vitals:  Vitals:   11/20/15 0524 11/20/15 1200  BP: (!) 122/36 130/70  Pulse: 84 (!) 110  Resp: 16   Temp: 37.1 C     Last Pain:  Vitals:   11/20/15 1151  TempSrc:   PainSc: 7                  Falan Hensler EDWARD

## 2015-11-20 NOTE — Discharge Instructions (Signed)
INSTRUCTIONS AFTER JOINT REPLACEMENT  ° °o Remove items at home which could result in a fall. This includes throw rugs or furniture in walking pathways °o ICE to the affected joint every three hours while awake for 30 minutes at a time, for at least the first 3-5 days, and then as needed for pain and swelling.  Continue to use ice for pain and swelling. You may notice swelling that will progress down to the foot and ankle.  This is normal after surgery.  Elevate your leg when you are not up walking on it.   °o Continue to use the breathing machine you got in the hospital (incentive spirometer) which will help keep your temperature down.  It is common for your temperature to cycle up and down following surgery, especially at night when you are not up moving around and exerting yourself.  The breathing machine keeps your lungs expanded and your temperature down. ° ° °DIET:  As you were doing prior to hospitalization, we recommend a well-balanced diet. ° °DRESSING / WOUND CARE / SHOWERING ° °Keep the surgical dressing until follow up.  IF THE DRESSING FALLS OFF or the wound gets wet inside, change the dressing with sterile gauze.  Please use good hand washing techniques before changing the dressing.  Do not use any lotions or creams on the incision until instructed by your surgeon.   ° °ACTIVITY ° °o Increase activity slowly as tolerated, but follow the weight bearing instructions below.   °o No driving for 6 weeks or until further direction given by your physician.  You cannot drive while taking narcotics.  °o No lifting or carrying greater than 10 lbs. until further directed by your surgeon. °o Avoid periods of inactivity such as sitting longer than an hour when not asleep. This helps prevent blood clots.  °o You may return to work once you are authorized by your doctor.  ° ° ° °WEIGHT BEARING  ° °Weight bearing as tolerated with assist device (walker, cane, etc) as directed, use it as long as suggested by your  surgeon or therapist, typically at least 4-6 weeks. ° ° °EXERCISES ° °Results after joint replacement surgery are often greatly improved when you follow the exercise, range of motion and muscle strengthening exercises prescribed by your doctor. Safety measures are also important to protect the joint from further injury. Any time any of these exercises cause you to have increased pain or swelling, decrease what you are doing until you are comfortable again and then slowly increase them. If you have problems or questions, call your caregiver or physical therapist for advice.  ° °Rehabilitation is important following a joint replacement. After just a few days of immobilization, the muscles of the leg can become weakened and shrink (atrophy).  These exercises are designed to build up the tone and strength of the thigh and leg muscles and to improve motion. Often times heat used for twenty to thirty minutes before working out will loosen up your tissues and help with improving the range of motion but do not use heat for the first two weeks following surgery (sometimes heat can increase post-operative swelling).  ° °These exercises can be done on a training (exercise) mat, on the floor, on a table or on a bed. Use whatever works the best and is most comfortable for you.    Use music or television while you are exercising so that the exercises are a pleasant break in your day. This will make your life   better with the exercises acting as a break in your routine that you can look forward to.   Perform all exercises about fifteen times, three times per day or as directed.  You should exercise both the operative leg and the other leg as well. ° °Exercises include: °  °• Quad Sets - Tighten up the muscle on the front of the thigh (Quad) and hold for 5-10 seconds.   °• Straight Leg Raises - With your knee straight (if you were given a brace, keep it on), lift the leg to 60 degrees, hold for 3 seconds, and slowly lower the leg.   Perform this exercise against resistance later as your leg gets stronger.  °• Leg Slides: Lying on your back, slowly slide your foot toward your buttocks, bending your knee up off the floor (only go as far as is comfortable). Then slowly slide your foot back down until your leg is flat on the floor again.  °• Angel Wings: Lying on your back spread your legs to the side as far apart as you can without causing discomfort.  °• Hamstring Strength:  Lying on your back, push your heel against the floor with your leg straight by tightening up the muscles of your buttocks.  Repeat, but this time bend your knee to a comfortable angle, and push your heel against the floor.  You may put a pillow under the heel to make it more comfortable if necessary.  ° °A rehabilitation program following joint replacement surgery can speed recovery and prevent re-injury in the future due to weakened muscles. Contact your doctor or a physical therapist for more information on knee rehabilitation.  ° ° °CONSTIPATION ° °Constipation is defined medically as fewer than three stools per week and severe constipation as less than one stool per week.  Even if you have a regular bowel pattern at home, your normal regimen is likely to be disrupted due to multiple reasons following surgery.  Combination of anesthesia, postoperative narcotics, change in appetite and fluid intake all can affect your bowels.  ° °YOU MUST use at least one of the following options; they are listed in order of increasing strength to get the job done.  They are all available over the counter, and you may need to use some, POSSIBLY even all of these options:   ° °Drink plenty of fluids (prune juice may be helpful) and high fiber foods °Colace 100 mg by mouth twice a day  °Senokot for constipation as directed and as needed Dulcolax (bisacodyl), take with full glass of water  °Miralax (polyethylene glycol) once or twice a day as needed. ° °If you have tried all these things and  are unable to have a bowel movement in the first 3-4 days after surgery call either your surgeon or your primary doctor.   ° °If you experience loose stools or diarrhea, hold the medications until you stool forms back up.  If your symptoms do not get better within 1 week or if they get worse, check with your doctor.  If you experience "the worst abdominal pain ever" or develop nausea or vomiting, please contact the office immediately for further recommendations for treatment. ° ° °ITCHING:  If you experience itching with your medications, try taking only a single pain pill, or even half a pain pill at a time.  You can also use Benadryl over the counter for itching or also to help with sleep.  ° °TED HOSE STOCKINGS:  Use stockings on both legs   until for at least 2 weeks or as directed by physician office. They may be removed at night for sleeping.  MEDICATIONS:  See your medication summary on the After Visit Summary that nursing will review with you.  You may have some home medications which will be placed on hold until you complete the course of blood thinner medication.  It is important for you to complete the blood thinner medication as prescribed.  PRECAUTIONS:  If you experience chest pain or shortness of breath - call 911 immediately for transfer to the hospital emergency department.   If you develop a fever greater that 101 F, purulent drainage from wound, increased redness or drainage from wound, foul odor from the wound/dressing, or calf pain - CONTACT YOUR SURGEON.                                                   FOLLOW-UP APPOINTMENTS:  If you do not already have a post-op appointment, please call the office for an appointment to be seen by your surgeon.  Guidelines for how soon to be seen are listed in your After Visit Summary, but are typically between 1-4 weeks after surgery.  OTHER INSTRUCTIONS:   Knee Replacement:  Do not place pillow under knee, focus on keeping the knee straight  while resting. CPM instructions: 0-90 degrees, 2 hours in the morning, 2 hours in the afternoon, and 2 hours in the evening. Place foam block, curve side up under heel at all times except when in CPM or when walking.  DO NOT modify, tear, cut, or change the foam block in any way.  MAKE SURE YOU:   Understand these instructions.   Get help right away if you are not doing well or get worse.    Thank you for letting us be a part of your medical care team.  It is a privilege we respect greatly.  We hope these instructions will help you stay on track for a fast and full recovery!   ------------------ Information on my medicine - XARELTO (Rivaroxaban)  This medication education was reviewed with me or my healthcare representative as part of my discharge preparation.  The pharmacist that spoke with me during my hospital stay was:  Dareen Piano, [facility]  Why was Xarelto prescribed for you? Xarelto was prescribed for you to reduce the risk of blood clots forming after orthopedic surgery. The medical term for these abnormal blood clots is venous thromboembolism (VTE).  What do you need to know about xarelto ? Take your Xarelto ONCE DAILY at the same time every day. You may take it either with or without food.  If you have difficulty swallowing the tablet whole, you may crush it and mix in applesauce just prior to taking your dose.  Take Xarelto exactly as prescribed by your doctor and DO NOT stop taking Xarelto without talking to the doctor who prescribed the medication.  Stopping without other VTE prevention medication to take the place of Xarelto may increase your risk of developing a clot.  After discharge, you should have regular check-up appointments with your healthcare provider that is prescribing your Xarelto.    What do you do if you miss a dose? If you miss a dose, take it as soon as you remember on the same day then continue your regularly scheduled once daily regimen  the  next day. Do not take two doses of Xarelto on the same day.   Important Safety Information A possible side effect of Xarelto is bleeding. You should call your healthcare provider right away if you experience any of the following: ? Bleeding from an injury or your nose that does not stop. ? Unusual colored urine (red or dark brown) or unusual colored stools (red or black). ? Unusual bruising for unknown reasons. ? A serious fall or if you hit your head (even if there is no bleeding).  Some medicines may interact with Xarelto and might increase your risk of bleeding while on Xarelto. To help avoid this, consult your healthcare provider or pharmacist prior to using any new prescription or non-prescription medications, including herbals, vitamins, non-steroidal anti-inflammatory drugs (NSAIDs) and supplements.  This website has more information on Xarelto: [email or web address]

## 2015-11-20 NOTE — Evaluation (Signed)
Physical Therapy Evaluation Patient Details Name: [name] MRN: [id number] DOB: 05/03/1950 Today's Date: 11/20/2015   History of Present Illness  Pt admitted 7/25 for elective L anterior THA. PSH: R THA and 14 neck/back surgeries  Clinical Impression  Pt ambulated with R shoe on only and reports feeling like legs are more even.  Pt reports history of multiple back surgeries including shaving of iliac crest.  Discussed that these surgeries could be a contributing factor.  She did ambulate better with R shoe on compared to earlier session and was able to have a more normalized gait pattern.  Con't to recommend HHPT.    Follow Up Recommendations Home health PT;Supervision/Assistance - 24 hour    Equipment Recommendations  None recommended by PT    Recommendations for Other Services       Precautions / Restrictions Precautions Precautions: Anterior Hip Precaution Comments: no orders regarding precautions, but no indication surgery was direct so reviewed anterior hip precautions to be safe Restrictions Weight Bearing Restrictions: Yes LLE Weight Bearing: Weight bearing as tolerated      Mobility  Bed Mobility               General bed mobility comments: sitting up in recliner upon arrival  Transfers Overall transfer level: Needs assistance Equipment used: Rolling walker (2 wheeled) Transfers: Sit to/from Stand Sit to Stand: Supervision         General transfer comment: good use of hands  Ambulation/Gait Ambulation/Gait assistance: Min guard;Supervision Ambulation Distance (Feet): 550 Feet Assistive device: Rolling walker (2 wheeled) Gait Pattern/deviations: Step-through pattern     General Gait Details: Donned R shoe for gait and pt reports feeling more even.  Pt able to decreased WB through UE as gait progressed and could do heel toe pattern.  Stairs            Wheelchair Mobility    Modified Rankin (Stroke Patients Only)       Balance              Standing balance-Leahy Scale: Fair                               Pertinent Vitals/Pain Pain Assessment: 0-10 Pain Intervention(s): Premedicated before session (nurse just leaving after giving pain meds)    Home Living Family/patient expects to be discharged to:: Private residence Living Arrangements: Spouse/significant other                    Prior Function                 Hand Dominance        Extremity/Trunk Assessment                         Communication      Cognition Arousal/Alertness: Awake/alert Behavior During Therapy: WFL for tasks assessed/performed Overall Cognitive Status: Within Functional Limits for tasks assessed                      General Comments General comments (skin integrity, edema, etc.): Pt reports multiple back surgeries    Exercises        Assessment/Plan    PT Assessment    PT Diagnosis     PT Problem List    PT Treatment Interventions     PT Goals (Current goals can be found in the Care Plan section)  Acute Rehab PT Goals Patient Stated Goal: home PT Goal Formulation: With patient Time For Goal Achievement: 11/26/15 Potential to Achieve Goals: Good    Frequency 7X/week   Barriers to discharge        Co-evaluation               End of Session Equipment Utilized During Treatment: Gait belt Activity Tolerance: Patient tolerated treatment well Patient left: in chair;with call bell/phone within reach           Time: 1410-1431 PT Time Calculation (min) (ACUTE ONLY): 21 min   Charges:     PT Treatments $Gait Training: 8-22 mins   PT G Codes:        Fard Borunda LUBECK 11/20/2015, 2:39 PM

## 2015-11-21 ENCOUNTER — Encounter (HOSPITAL_COMMUNITY): Payer: Self-pay | Admitting: Orthopedic Surgery

## 2015-11-21 DIAGNOSIS — M1612 Unilateral primary osteoarthritis, left hip: Secondary | ICD-10-CM | POA: Diagnosis not present

## 2015-11-21 LAB — BASIC METABOLIC PANEL
ANION GAP: 7 (ref 5–15)
BUN: 12 mg/dL (ref 6–20)
CHLORIDE: 107 mmol/L (ref 101–111)
CO2: 25 mmol/L (ref 22–32)
Calcium: 8.9 mg/dL (ref 8.9–10.3)
Creatinine, Ser: 0.59 mg/dL (ref 0.44–1.00)
GFR calc non Af Amer: >60 mL/min (ref >60)
GLUCOSE: 150 mg/dL — AB (ref 65–99)
POTASSIUM: 4.2 mmol/L (ref 3.5–5.1)
Sodium: 139 mmol/L (ref 135–145)

## 2015-11-21 LAB — CBC
HEMATOCRIT: 35 % — AB (ref 36.0–46.0)
HEMOGLOBIN: 11.1 g/dL — AB (ref 12.0–15.0)
MCH: 32.6 pg (ref 26.0–34.0)
MCHC: 31.7 g/dL (ref 30.0–36.0)
MCV: 102.9 fL — AB (ref 78.0–100.0)
Platelets: 148 10*3/uL — ABNORMAL LOW (ref 150–400)
RBC: 3.4 MIL/uL — AB (ref 3.87–5.11)
RDW: 13.1 % (ref 11.5–15.5)
WBC: 9.1 10*3/uL (ref 4.0–10.5)

## 2015-11-21 NOTE — Discharge Summary (Signed)
Discharge Summary  Patient ID: [name] MRN: [id number] DOB/AGE: 63-Feb-1952 63 y.o.  Admit date: 11/19/2015 Discharge date: 11/21/2015  Admission Diagnoses:  Primary osteoarthritis of left hip  Discharge Diagnoses:  Principal Problem:   Primary osteoarthritis of left hip Active Problems:   Mitral valve disorder   COPD (chronic obstructive pulmonary disease) (HCC)   History of tobacco abuse   SVT (supraventricular tachycardia) (HCC)   Hyperlipidemia   DJD (degenerative joint disease)   First degree AV block   Hypokalemia   Acute blood loss anemia   Past Medical History:  Diagnosis Date  . Anxiety   . Arthritis   . Back pain, chronic   . Complication of anesthesia    per patient "difficult stick- needs central line or picc"  . COPD (chronic obstructive pulmonary disease) (HCC)    emphysema no meds  . Depression   . Dysrhythmia    hx svt  . Heart murmur   . IBS (irritable bowel syndrome)    flairs up occasionally esp if stressed  . PONV (postoperative nausea and vomiting)    nausea occ  . PSVT (paroxysmal supraventricular tachycardia) (HCC)     Surgeries: Procedure(s): TOTAL HIP ARTHROPLASTY ANTERIOR APPROACH on 11/19/2015   Consultants (if any):   Discharged Condition: Improved  Hospital Course: [name] is an 65 y.o. female who was admitted 11/19/2015 with a diagnosis of Primary osteoarthritis of left hip and went to the operating room on 11/19/2015 and underwent the above named procedures.    She was given perioperative antibiotics:  Anti-infectives    Start     Dose/Rate Route Frequency Ordered Stop   11/19/15 1630  ceFAZolin (ANCEF) IVPB 2g/100 mL premix     2 g 200 mL/hr over 30 Minutes Intravenous Every 6 hours 11/19/15 1457 11/19/15 2142   11/19/15 0900  ceFAZolin (ANCEF) IVPB 2g/100 mL premix     2 g 200 mL/hr over 30 Minutes Intravenous To Short Stay 11/18/15 1007 11/19/15 1029    .  She was given sequential compression devices, early  ambulation, and Xarelto for DVT prophylaxis.  She benefited maximally from the hospital stay and there were no complications.    Recent vital signs:  Vitals:   11/20/15 1554 11/20/15 2008  BP: (!) 102/56 (!) 105/41  Pulse:  81  Resp:  16  Temp:  98.5 F (36.9 C)    Recent laboratory studies:  Lab Results  Component Value Date   HGB 11.1 (L) 11/21/2015   HGB 11.8 (L) 11/20/2015   HGB 14.4 11/07/2015   Lab Results  Component Value Date   WBC 9.1 11/21/2015   PLT 148 (L) 11/21/2015   Lab Results  Component Value Date   INR 1.01 11/07/2015   Lab Results  Component Value Date   NA 139 11/21/2015   K 4.2 11/21/2015   CL 107 11/21/2015   CO2 25 11/21/2015   BUN 12 11/21/2015   CREATININE 0.59 11/21/2015   GLUCOSE 150 (H) 11/21/2015    Discharge Medications:     Medication List    STOP taking these medications   HYDROcodone-acetaminophen 10-325 MG tablet Commonly known as:  NORCO     TAKE these medications   aspirin EC 81 MG tablet Take 81 mg by mouth at bedtime.   atenolol 25 MG tablet Commonly known as:  TENORMIN Take 1/2 tablet twice a day What changed:  how much to take  how to take this  when to take this  additional instructions   atorvastatin 10 MG tablet Commonly known as:  LIPITOR Take 1 tablet (10 mg total) by mouth daily. What changed:  how much to take  when to take this  additional instructions   baclofen 20 MG tablet Commonly known as:  LIORESAL Take 20 mg by mouth 3 (three) times daily as needed for muscle spasms.   CALCIUM + D PO Take 1 tablet by mouth 2 (two) times daily. Calcium Magnesium Zinc and Vitamin D   chlorhexidine 0.12 % solution Commonly known as:  PERIDEX Use as directed 15 mLs in the mouth or throat 2 (two) times daily.   clonazePAM 1 MG tablet Commonly known as:  KLONOPIN Take 1 mg by mouth at bedtime.   docusate sodium 100 MG capsule Commonly known as:  COLACE Take 1 capsule (100 mg total) by mouth  2 (two) times daily.   fish oil-omega-3 fatty acids 1000 MG capsule Take 1 g by mouth daily.   multivitamin with minerals Tabs tablet Take 1 tablet by mouth daily.   ondansetron 4 MG tablet Commonly known as:  ZOFRAN Take 1 tablet (4 mg total) by mouth every 8 (eight) hours as needed for nausea or vomiting.   oxyCODONE-acetaminophen 5-325 MG tablet Commonly known as:  ROXICET Take 1-2 tablets by mouth every 4 (four) hours as needed for severe pain.   rivaroxaban 10 MG Tabs tablet Commonly known as:  XARELTO Take 1 tablet (10 mg total) by mouth daily.   sertraline 100 MG tablet Commonly known as:  ZOLOFT Take 50 mg by mouth daily.   verapamil 240 MG CR tablet Commonly known as:  CALAN-SR TAKE 1 TABLET AT BEDTIME.   vitamin B-12 1000 MCG tablet Commonly known as:  CYANOCOBALAMIN Take 1,000 mcg by mouth daily.       Diagnostic Studies: Dg C-arm 1-60 Min  Result Date: 11/19/2015 CLINICAL DATA:  Status post left hip arthroplasty. EXAM: OPERATIVE left HIP (WITH PELVIS IF PERFORMED) 2 VIEWS FLUOROSCOPY TIME:  14 seconds. TECHNIQUE: Fluoroscopic spot image(s) were submitted for interpretation post-operatively. COMPARISON:  Radiograph of July 31, 2014. FINDINGS: The femoral and acetabular components appear to be well situated. No fracture or dislocation is noted. IMPRESSION: Status post left total hip arthroplasty. Electronically Signed   By: Marijo Conception, M.D.   On: 11/19/2015 12:00  Dg Hip Operative Unilat W Or W/o Pelvis Left  Result Date: 11/19/2015 CLINICAL DATA:  Status post left hip arthroplasty. EXAM: OPERATIVE left HIP (WITH PELVIS IF PERFORMED) 2 VIEWS FLUOROSCOPY TIME:  14 seconds. TECHNIQUE: Fluoroscopic spot image(s) were submitted for interpretation post-operatively. COMPARISON:  Radiograph of July 31, 2014. FINDINGS: The femoral and acetabular components appear to be well situated. No fracture or dislocation is noted. IMPRESSION: Status post left total hip  arthroplasty. Electronically Signed   By: Marijo Conception, M.D.   On: 11/19/2015 12:00   Disposition: 01-Home or Self Care    Follow-up Information    MURPHY, TIMOTHY D, MD Follow up in 2 week(s).   Specialty:  Orthopedic Surgery Contact information: [address] [place] [address] [place] Why:  Someone from [facility] will contact you to arrange start date and time for therapy. Contact information: [address] [address] [phone]            Signed: Prudencio Burly III PA-C  11/21/2015, 7:28 AM

## 2015-11-21 NOTE — Progress Notes (Signed)
   Assessment: 2 Days Post-Op  S/P Procedure(s) (LRB): TOTAL HIP ARTHROPLASTY ANTERIOR APPROACH (Left) by Dr. Ernesta Amble. Percell Miller on 11/19/15  Principal Problem:   Primary osteoarthritis of left hip Active Problems:   Mitral valve disorder   COPD (chronic obstructive pulmonary disease) (HCC)   History of tobacco abuse   SVT (supraventricular tachycardia) (HCC)   Hyperlipidemia   DJD (degenerative joint disease)   First degree AV block   Acute blood loss anemia  - leveling off, likely with dilutional component.    Hypokalemia - resolved   Plan: Discharge home with home health today.  Weight Bearing: Weight Bearing as Tolerated (WBAT) left leg Dressings:  Mepilex changed this morning VTE prophylaxis: Xarelto, SCDs, ambulation Dispo: Home -  Today  Subjective: Patient feels well and desires discharge today.  Patient reports pain as moderate and controlled primarily with PO meds.  Tolerating diet.  Urinating.  +Flatus.  No CP, SOB.  OOB walking well in the hall and negotiating stairs.  Objective:   VITALS:   Vitals:   11/20/15 1200 11/20/15 1535 11/20/15 1554 11/20/15 2008  BP: 130/70 (!) 99/42 (!) 102/56 (!) 105/41  Pulse: (!) 110 76  81  Resp:  16  16  Temp:  99.6 F (37.6 C)  98.5 F (36.9 C)  TempSrc:    Oral  SpO2:  92%  95%  Weight:      Height:       Labs cbc  Recent Labs  11/20/15 0651 11/21/15 0354  WBC 6.6 9.1  HGB 11.8* 11.1*  HCT 37.2 35.0*  PLT 159 148*     Recent Labs  11/20/15 0651 11/21/15 0354  NA 141 139  K 3.3* 4.2  CL 104 107  CO2 28 25  GLUCOSE 91 150*  BUN 13 12  CREATININE 0.77 0.59  CALCIUM 9.0 8.9    Physical Exam General: NAD.  Supine in bed.  Mood Positive. Neurologically intact ABD soft No increased WOB CV RRR MSK: left leg Neurovascular intact Sensation intact distally Dorsiflexion/Plantar flexion intact Incision: scant drainage   Charna Elizabeth Martensen III 11/21/2015, 7:18 AM

## 2015-11-21 NOTE — Progress Notes (Signed)
Physical Therapy Treatment Patient Details Name: KHIYAH MABEN MRN: [id number] DOB: Jul 20, 1950 Today's Date: 11/21/2015    History of Present Illness Pt admitted 7/25 for elective L anterior THA. PSH: R THA and 14 neck/back surgeries    PT Comments    Pt performed increased therapeutic exercise and stair training.  Pt ready for d/c from a mobility standpoint.  Pt performed decreased gait to focus on stair training and review of completed HEP.  Pt presents with noticeable quad weakness and L limb length discrepancy.     Follow Up Recommendations  Home health PT;Supervision/Assistance - 24 hour     Equipment Recommendations  None recommended by PT    Recommendations for Other Services       Precautions / Restrictions Precautions Precautions: None Precaution Booklet Issued: Yes (comment) Restrictions Weight Bearing Restrictions: Yes LLE Weight Bearing: Weight bearing as tolerated    Mobility  Bed Mobility               General bed mobility comments: Pt received standing in room on the way to the bathroom with NT.    Transfers Overall transfer level: Needs assistance Equipment used: Rolling walker (2 wheeled) Transfers: Sit to/from Stand Sit to Stand: Supervision         General transfer comment: Pt required cues for hand placement and safety.    Ambulation/Gait Ambulation/Gait assistance: Supervision Ambulation Distance (Feet): 80 Feet (Pt reports she over did it yesterday, decreased gait to focus on stair training.  ) Assistive device: Rolling walker (2 wheeled) Gait Pattern/deviations: Step-through pattern;Trunk flexed;Antalgic Gait velocity: decreased   General Gait Details: Pt has ppor foot clearance on L during swing phase.  Presents with apparent limb length descrepancy.  pt required cues for L foot clearance and L heel strike.     Stairs Stairs: Yes Stairs assistance: Supervision Stair Management: Backwards;Forwards Number of Stairs:  16 General stair comments: Pt required cues for sequencing and technique.  Pt performed forward ascending all 16 stairs, forward descending 8 and backward descending 8 stairs.  pt reports she feels better descending backwards and reports decreased painn with method.    Wheelchair Mobility    Modified Rankin (Stroke Patients Only)       Balance Overall balance assessment: Needs assistance Sitting-balance support: Feet supported;No upper extremity supported Sitting balance-Leahy Scale: Fair       Standing balance-Leahy Scale: Good                      Cognition Arousal/Alertness: Awake/alert Behavior During Therapy: WFL for tasks assessed/performed Overall Cognitive Status: Within Functional Limits for tasks assessed                      Exercises Total Joint Exercises Ankle Circles/Pumps: AROM;Both;10 reps;Supine Quad Sets: AROM;Left;10 reps;Supine Short Arc Quad: AROM;Left;10 reps;Supine Heel Slides: AAROM;Left;10 reps;Supine Hip ABduction/ADduction: AAROM;AROM;Left;20 reps;Supine;Standing (1x10 arom in standing and 1x10 aarom in supine.  ) Long Arc Quad: AROM;Left;10 reps;Seated Knee Flexion: AROM;Left;10 reps;Standing Marching in Standing: AROM;Left;10 reps;Standing Standing Hip Extension: AROM;Left;10 reps;Standing    General Comments        Pertinent Vitals/Pain Pain Assessment: 0-10 Pain Score: 5  Pain Location: L hip  Pain Descriptors / Indicators: Discomfort;Dull;Spasm;Sore Pain Intervention(s): Monitored during session    Home Living                      Prior Function  PT Goals (current goals can now be found in the care plan section) Acute Rehab PT Goals Patient Stated Goal: home Potential to Achieve Goals: Good Progress towards PT goals: Progressing toward goals    Frequency  7X/week    PT Plan Current plan remains appropriate    Co-evaluation             End of Session Equipment Utilized During  Treatment: Gait belt Activity Tolerance: Patient tolerated treatment well Patient left: in chair;with call bell/phone within reach     Time: 0912-0942 PT Time Calculation (min) (ACUTE ONLY): 30 min  Charges:  $Gait Training: 8-22 mins $Therapeutic Exercise: 8-22 mins                    G Codes:      Cristela Blue 12-05-2015, 9:55 AM Governor Rooks, PTA pager [phone]

## 2015-11-21 NOTE — Progress Notes (Signed)
Pt discharged to home via wheelchair accompanied by spouse without incident per MD order. Prior to d/c, discharge teachings done both written and verbal. All questions answered. Pt verb understanding of all teachings and agrees to comply. Pain controlled upon discharge and no change from AM assessment. Prescriptions given.

## 2015-11-22 ENCOUNTER — Encounter (HOSPITAL_COMMUNITY): Payer: Self-pay | Admitting: Orthopedic Surgery

## 2015-11-22 DIAGNOSIS — J449 Chronic obstructive pulmonary disease, unspecified: Secondary | ICD-10-CM | POA: Diagnosis not present

## 2015-11-22 DIAGNOSIS — Z79891 Long term (current) use of opiate analgesic: Secondary | ICD-10-CM | POA: Diagnosis not present

## 2015-11-22 DIAGNOSIS — Z471 Aftercare following joint replacement surgery: Secondary | ICD-10-CM | POA: Diagnosis not present

## 2015-11-22 DIAGNOSIS — M1611 Unilateral primary osteoarthritis, right hip: Secondary | ICD-10-CM | POA: Diagnosis not present

## 2015-11-22 DIAGNOSIS — Z7901 Long term (current) use of anticoagulants: Secondary | ICD-10-CM | POA: Diagnosis not present

## 2015-11-22 DIAGNOSIS — Z96642 Presence of left artificial hip joint: Secondary | ICD-10-CM | POA: Diagnosis not present

## 2015-11-24 DIAGNOSIS — M1612 Unilateral primary osteoarthritis, left hip: Secondary | ICD-10-CM | POA: Diagnosis not present

## 2015-11-25 DIAGNOSIS — M1611 Unilateral primary osteoarthritis, right hip: Secondary | ICD-10-CM | POA: Diagnosis not present

## 2015-11-25 DIAGNOSIS — Z471 Aftercare following joint replacement surgery: Secondary | ICD-10-CM | POA: Diagnosis not present

## 2015-11-25 DIAGNOSIS — Z7901 Long term (current) use of anticoagulants: Secondary | ICD-10-CM | POA: Diagnosis not present

## 2015-11-25 DIAGNOSIS — Z79891 Long term (current) use of opiate analgesic: Secondary | ICD-10-CM | POA: Diagnosis not present

## 2015-11-25 DIAGNOSIS — J449 Chronic obstructive pulmonary disease, unspecified: Secondary | ICD-10-CM | POA: Diagnosis not present

## 2015-11-25 DIAGNOSIS — Z96642 Presence of left artificial hip joint: Secondary | ICD-10-CM | POA: Diagnosis not present

## 2015-11-26 DIAGNOSIS — M1612 Unilateral primary osteoarthritis, left hip: Secondary | ICD-10-CM | POA: Diagnosis not present

## 2015-11-27 DIAGNOSIS — Z96642 Presence of left artificial hip joint: Secondary | ICD-10-CM | POA: Diagnosis not present

## 2015-11-27 DIAGNOSIS — M1612 Unilateral primary osteoarthritis, left hip: Secondary | ICD-10-CM | POA: Diagnosis not present

## 2015-11-27 DIAGNOSIS — Z79891 Long term (current) use of opiate analgesic: Secondary | ICD-10-CM | POA: Diagnosis not present

## 2015-11-27 DIAGNOSIS — M1611 Unilateral primary osteoarthritis, right hip: Secondary | ICD-10-CM | POA: Diagnosis not present

## 2015-11-27 DIAGNOSIS — Z7901 Long term (current) use of anticoagulants: Secondary | ICD-10-CM | POA: Diagnosis not present

## 2015-11-27 DIAGNOSIS — Z471 Aftercare following joint replacement surgery: Secondary | ICD-10-CM | POA: Diagnosis not present

## 2015-11-27 DIAGNOSIS — J449 Chronic obstructive pulmonary disease, unspecified: Secondary | ICD-10-CM | POA: Diagnosis not present

## 2015-11-29 DIAGNOSIS — J449 Chronic obstructive pulmonary disease, unspecified: Secondary | ICD-10-CM | POA: Diagnosis not present

## 2015-11-29 DIAGNOSIS — Z79891 Long term (current) use of opiate analgesic: Secondary | ICD-10-CM | POA: Diagnosis not present

## 2015-11-29 DIAGNOSIS — Z7901 Long term (current) use of anticoagulants: Secondary | ICD-10-CM | POA: Diagnosis not present

## 2015-11-29 DIAGNOSIS — Z471 Aftercare following joint replacement surgery: Secondary | ICD-10-CM | POA: Diagnosis not present

## 2015-11-29 DIAGNOSIS — M1611 Unilateral primary osteoarthritis, right hip: Secondary | ICD-10-CM | POA: Diagnosis not present

## 2015-11-29 DIAGNOSIS — Z96642 Presence of left artificial hip joint: Secondary | ICD-10-CM | POA: Diagnosis not present

## 2015-12-02 DIAGNOSIS — Z79891 Long term (current) use of opiate analgesic: Secondary | ICD-10-CM | POA: Diagnosis not present

## 2015-12-02 DIAGNOSIS — Z96642 Presence of left artificial hip joint: Secondary | ICD-10-CM | POA: Diagnosis not present

## 2015-12-02 DIAGNOSIS — J449 Chronic obstructive pulmonary disease, unspecified: Secondary | ICD-10-CM | POA: Diagnosis not present

## 2015-12-02 DIAGNOSIS — M1611 Unilateral primary osteoarthritis, right hip: Secondary | ICD-10-CM | POA: Diagnosis not present

## 2015-12-02 DIAGNOSIS — Z7901 Long term (current) use of anticoagulants: Secondary | ICD-10-CM | POA: Diagnosis not present

## 2015-12-02 DIAGNOSIS — Z471 Aftercare following joint replacement surgery: Secondary | ICD-10-CM | POA: Diagnosis not present

## 2015-12-04 DIAGNOSIS — M1612 Unilateral primary osteoarthritis, left hip: Secondary | ICD-10-CM | POA: Diagnosis not present

## 2015-12-04 DIAGNOSIS — M1611 Unilateral primary osteoarthritis, right hip: Secondary | ICD-10-CM | POA: Diagnosis not present

## 2015-12-04 DIAGNOSIS — Z79891 Long term (current) use of opiate analgesic: Secondary | ICD-10-CM | POA: Diagnosis not present

## 2015-12-04 DIAGNOSIS — J449 Chronic obstructive pulmonary disease, unspecified: Secondary | ICD-10-CM | POA: Diagnosis not present

## 2015-12-04 DIAGNOSIS — Z7901 Long term (current) use of anticoagulants: Secondary | ICD-10-CM | POA: Diagnosis not present

## 2015-12-04 DIAGNOSIS — Z96642 Presence of left artificial hip joint: Secondary | ICD-10-CM | POA: Diagnosis not present

## 2015-12-04 DIAGNOSIS — Z471 Aftercare following joint replacement surgery: Secondary | ICD-10-CM | POA: Diagnosis not present

## 2016-01-01 DIAGNOSIS — M1612 Unilateral primary osteoarthritis, left hip: Secondary | ICD-10-CM | POA: Diagnosis not present

## 2016-01-02 DIAGNOSIS — G894 Chronic pain syndrome: Secondary | ICD-10-CM | POA: Diagnosis not present

## 2016-01-02 DIAGNOSIS — M961 Postlaminectomy syndrome, not elsewhere classified: Secondary | ICD-10-CM | POA: Diagnosis not present

## 2016-01-02 DIAGNOSIS — Z79891 Long term (current) use of opiate analgesic: Secondary | ICD-10-CM | POA: Diagnosis not present

## 2016-01-02 DIAGNOSIS — M47816 Spondylosis without myelopathy or radiculopathy, lumbar region: Secondary | ICD-10-CM | POA: Diagnosis not present

## 2016-01-02 DIAGNOSIS — M546 Pain in thoracic spine: Secondary | ICD-10-CM | POA: Diagnosis not present

## 2016-01-15 DIAGNOSIS — K589 Irritable bowel syndrome without diarrhea: Secondary | ICD-10-CM | POA: Diagnosis not present

## 2016-01-16 DIAGNOSIS — Z1231 Encounter for screening mammogram for malignant neoplasm of breast: Secondary | ICD-10-CM | POA: Diagnosis not present

## 2016-01-16 DIAGNOSIS — Z01419 Encounter for gynecological examination (general) (routine) without abnormal findings: Secondary | ICD-10-CM | POA: Diagnosis not present

## 2016-01-16 DIAGNOSIS — Z124 Encounter for screening for malignant neoplasm of cervix: Secondary | ICD-10-CM | POA: Diagnosis not present

## 2016-01-17 DIAGNOSIS — Z124 Encounter for screening for malignant neoplasm of cervix: Secondary | ICD-10-CM | POA: Diagnosis not present

## 2016-01-24 ENCOUNTER — Other Ambulatory Visit: Payer: Self-pay | Admitting: Obstetrics and Gynecology

## 2016-01-24 DIAGNOSIS — R928 Other abnormal and inconclusive findings on diagnostic imaging of breast: Secondary | ICD-10-CM

## 2016-01-27 ENCOUNTER — Ambulatory Visit
Admission: RE | Admit: 2016-01-27 | Discharge: 2016-01-27 | Disposition: A | Discharge status: Home / Self Care | Payer: Medicare Other | Source: Ambulatory Visit | Attending: Obstetrics and Gynecology | Admitting: Obstetrics and Gynecology

## 2016-01-27 ENCOUNTER — Other Ambulatory Visit: Payer: Self-pay | Admitting: Obstetrics and Gynecology

## 2016-01-27 DIAGNOSIS — N6324 Unspecified lump in the left breast, lower inner quadrant: Secondary | ICD-10-CM | POA: Diagnosis not present

## 2016-01-27 DIAGNOSIS — R928 Other abnormal and inconclusive findings on diagnostic imaging of breast: Secondary | ICD-10-CM

## 2016-01-27 DIAGNOSIS — R922 Inconclusive mammogram: Secondary | ICD-10-CM | POA: Diagnosis not present

## 2016-01-27 DIAGNOSIS — N6320 Unspecified lump in the left breast, unspecified quadrant: Secondary | ICD-10-CM | POA: Diagnosis not present

## 2016-01-28 ENCOUNTER — Ambulatory Visit
Admission: RE | Admit: 2016-01-28 | Discharge: 2016-01-28 | Disposition: A | Discharge status: Home / Self Care | Payer: Medicare Other | Source: Ambulatory Visit | Attending: Obstetrics and Gynecology | Admitting: Obstetrics and Gynecology

## 2016-01-28 DIAGNOSIS — R928 Other abnormal and inconclusive findings on diagnostic imaging of breast: Secondary | ICD-10-CM

## 2016-01-28 DIAGNOSIS — N6320 Unspecified lump in the left breast, unspecified quadrant: Secondary | ICD-10-CM | POA: Diagnosis not present

## 2016-01-28 DIAGNOSIS — N6324 Unspecified lump in the left breast, lower inner quadrant: Secondary | ICD-10-CM | POA: Diagnosis not present

## 2016-01-28 DIAGNOSIS — D242 Benign neoplasm of left breast: Secondary | ICD-10-CM | POA: Diagnosis not present

## 2016-02-10 DIAGNOSIS — Z23 Encounter for immunization: Secondary | ICD-10-CM | POA: Diagnosis not present

## 2016-02-26 DIAGNOSIS — N390 Urinary tract infection, site not specified: Secondary | ICD-10-CM | POA: Diagnosis not present

## 2016-03-02 DIAGNOSIS — M47816 Spondylosis without myelopathy or radiculopathy, lumbar region: Secondary | ICD-10-CM | POA: Diagnosis not present

## 2016-03-02 DIAGNOSIS — M961 Postlaminectomy syndrome, not elsewhere classified: Secondary | ICD-10-CM | POA: Diagnosis not present

## 2016-03-02 DIAGNOSIS — G894 Chronic pain syndrome: Secondary | ICD-10-CM | POA: Diagnosis not present

## 2016-03-02 DIAGNOSIS — M546 Pain in thoracic spine: Secondary | ICD-10-CM | POA: Diagnosis not present

## 2016-03-17 ENCOUNTER — Encounter: Payer: Self-pay | Admitting: Cardiovascular Disease

## 2016-03-17 ENCOUNTER — Ambulatory Visit (INDEPENDENT_AMBULATORY_CARE_PROVIDER_SITE_OTHER): Payer: Medicare Other | Admitting: Cardiovascular Disease

## 2016-03-17 VITALS — BP 90/60 | HR 52 | Ht 67.0 in | Wt 110.0 lb

## 2016-03-17 DIAGNOSIS — I471 Supraventricular tachycardia: Secondary | ICD-10-CM | POA: Diagnosis not present

## 2016-03-17 DIAGNOSIS — I6529 Occlusion and stenosis of unspecified carotid artery: Secondary | ICD-10-CM

## 2016-03-17 DIAGNOSIS — I6523 Occlusion and stenosis of bilateral carotid arteries: Secondary | ICD-10-CM | POA: Diagnosis not present

## 2016-03-17 DIAGNOSIS — E7800 Pure hypercholesterolemia, unspecified: Secondary | ICD-10-CM

## 2016-03-17 DIAGNOSIS — R001 Bradycardia, unspecified: Secondary | ICD-10-CM

## 2016-03-17 NOTE — Patient Instructions (Signed)
Decrease Atenolol to 12.5 mg in morning  Take Verapamil 240 mg at night   Continue all other medications   Schedule Carotid dopplers   Your physician wants you to follow-up in: 6 months. You will receive a reminder letter in the mail two months in advance. If you don't receive a letter, please call our office to schedule the follow-up appointment.

## 2016-03-19 NOTE — Progress Notes (Signed)
Patient ID: [name], female   DOB: 1950/12/06, 65 y.o.   MRN: [id number]    PCP: Dr. Gilford Rile  HPI: [name] is a 65 y.o. female resents to the office today for a 5 month followup cardiology evaluation.   [name] has a history of supraventricular tachycardia which has been fairly well-controlled on combination verapamil SR  plus beta blocker therapy with atenolol.  She has a history of underlying mitral valve prolapse, mild carotid disease, hyperlipidemia, as well as mild COPD. She had smoked for over 30 years but quit smoking approximately 7 years ago.  Four years ago, she was hospitalized after being bitten by a copperhead snake and required 5 days in the hospital. Apparently her cardiac medicines were held at that time and during the hospitalization she had recurrent episodes of tachycardia dysrhythmia. As long as she takes her medicines, and she feels that her heart rhythm is controlled. There is rare intermittent palpitations.typically, these episodes may last for under a minute and typically resolve with the Valsalva maneuver if they don't resolve spontaneously.  Since I last saw her, she is unaware of any breakthrough arrhythmias.  At that time her blood pressure was low and there was mild orthostatic drop.  Her resting pulse was 60.  I recommended that she continue to take Verapamil SR 240 mg but I reduced her atenolol dose to 12.5 g twice a day.  She underwent a follow-up echo Doppler study on 10/25/2015.  This showed an EF of 60-65%.  There was normal wall motion.  There was no significant valvular pathology.  She was given preoperative clearance to undergo hip surgery by Dr. Fredonia [place] which was done in July 2017 and she tolerated this well.  She recently underwent a panoramic dental x-ray.  Incidentally, she was noted to have calcified carotid atheromas of the internal carotid artery suggestive of atherosclerotic disease.  She presents for cardiology evaluation.   Past  Medical History:  Diagnosis Date  . Anxiety   . Arthritis   . Back pain, chronic   . Complication of anesthesia    per patient "difficult stick- needs central line or picc"  . COPD (chronic obstructive pulmonary disease) (HCC)    emphysema no meds  . Depression   . Dysrhythmia    hx svt  . Heart murmur   . IBS (irritable bowel syndrome)    flairs up occasionally esp if stressed  . PONV (postoperative nausea and vomiting)    nausea occ  . PSVT (paroxysmal supraventricular tachycardia) ([place])     Past Surgical History:  Procedure Laterality Date  . BACK SURGERY     14 back surgery ,neck and lumbar fused  . BREAST SURGERY  1976   breast reduction  . CERVICAL DISC SURGERY    . COLECTOMY    . KNEE SURGERY     bone spur  65 yrs old  . SPINE SURGERY  1983-01/2007  . TONSILLECTOMY    . TOTAL HIP ARTHROPLASTY Right 07/31/2014   Procedure: TOTAL HIP ARTHROPLASTY ANTERIOR APPROACH;  Surgeon: Renette Butters, MD;  Location: [facility];  Service: Orthopedics;  Laterality: Right;  . TOTAL HIP ARTHROPLASTY Left 11/19/2015  . TOTAL HIP ARTHROPLASTY Left 11/19/2015   Procedure: TOTAL HIP ARTHROPLASTY ANTERIOR APPROACH;  Surgeon: Renette Butters, MD;  Location: [facility];  Service: Orthopedics;  Laterality: Left;  . TUBAL LIGATION      Allergies  Allergen Reactions  . Bupropion Hcl Swelling  . Celecoxib  Swelling  . Naproxen Swelling  . Methadone Nausea And Vomiting  . Morphine Itching    Current Outpatient Prescriptions  Medication Sig Dispense Refill  . aspirin EC 81 MG tablet Take 81 mg by mouth at bedtime.    Marland Kitchen atenolol (TENORMIN) 25 MG tablet Take 12.5 mg by mouth.    Marland Kitchen atorvastatin (LIPITOR) 10 MG tablet Take 5 mg by mouth daily.    . baclofen (LIORESAL) 20 MG tablet Take 20 mg by mouth 3 (three) times daily as needed for muscle spasms.     . Calcium Carbonate-Vitamin D (CALCIUM + D PO) Take 1 tablet by mouth 2 (two) times daily. Calcium Magnesium Zinc and Vitamin D    . chlorhexidine  (PERIDEX) 0.12 % solution Use as directed 15 mLs in the mouth or throat 2 (two) times daily.     . clonazePAM (KLONOPIN) 1 MG tablet Take 1 mg by mouth at bedtime.    . fish oil-omega-3 fatty acids 1000 MG capsule Take 1 g by mouth daily.     . Multiple Vitamin (MULTIVITAMIN WITH MINERALS) TABS tablet Take 1 tablet by mouth daily.    . sertraline (ZOLOFT) 100 MG tablet Take 50 mg by mouth daily.     . verapamil (CALAN-SR) 240 MG CR tablet TAKE 1 TABLET AT BEDTIME. 90 tablet 3   No current facility-administered medications for this visit.     Social History   Social History  . Marital status: Married    Spouse name: N/A  . Number of children: N/A  . Years of education: N/A   Occupational History  . Not on file.   Social History Main Topics  . Smoking status: Former Smoker    Quit date: 05/06/2008  . Smokeless tobacco: Former Systems developer  . Alcohol use 0.0 oz/week    5 - 6 Glasses of wine per week     Comment: occasional  . Drug use: No  . Sexual activity: Yes   Other Topics Concern  . Not on file   Social History Narrative  . No narrative on file   Social history is notable in that she is married. She has one stepchild and 2 stepgrandchildren. She does walk. She has no recent tobacco use or alcohol use  Family History  Problem Relation Age of Onset  . COPD Father   . Diabetes Mother   . Heart disease Mother   . Kidney disease Mother   . Hypertension Mother     ROS General: Negative; No fevers, chills, or night sweats;  HEENT: Negative; No changes in vision or hearing, sinus congestion, difficulty swallowing Pulmonary: positive for mild COPD; No cough, wheezing, shortness of breath, hemoptysis Cardiovascular: Negative; No chest pain, presyncope, syncope, palpitations GI: Positive for irritable bowel syndrome GU: Negative; No dysuria, hematuria, or difficulty voiding Musculoskeletal: Status post left hip replacement surgery. Hematologic/Oncology: Negative; no easy  bruising, bleeding Endocrine: Negative; no heat/cold intolerance; no diabetes Neuro: Negative; no changes in balance, headaches Skin: Negative; No rashes or skin lesions Psychiatric: positive for mild depression for which she takes Zoloft.; No behavioral problems,  Sleep: Negative; No snoring, daytime sleepiness, hypersomnolence, bruxism, restless legs, hypnogognic hallucinations, no cataplexy Other comprehensive 14 point system review is negative.   PE BP 90/60   Pulse (!) 52   Ht '5\' 7"'$  (1.702 m)   Wt 110 lb (49.9 kg)   BMI 17.23 kg/m    Repeat blood pressure by me was 112/62 supine and 94/60 standing  Wt Readings  from Last 3 Encounters:  03/17/16 110 lb (49.9 kg)  11/19/15 120 lb (54.4 kg)  11/07/15 120 lb 3.2 oz (54.5 kg)   General: Alert, oriented, no distress.  Skin: normal turgor, no rashes HEENT: Normocephalic, atraumatic. Pupils round and reactive; sclera anicteric;no lid lag.  Nose without nasal septal hypertrophy Mouth/Parynx benign; Mallinpatti scale 2 Neck: No JVD, no carotid bruits with normal carotid upstroke Lungs: clear to ausculatation and percussion; no wheezing or rales Chest wall: Nontender to palpation Heart: Bradycardic in the 50s, s1 s2 normal 1/6 systolic murmur with an intermittent systolic click consistent with her mitral valve prolapse.Marland Kitchen  No S3 gallop.  No diastolic murmur, rubs thrills or heaves. Abdomen: soft, nontender; no hepatosplenomehaly, BS+; abdominal aorta nontender and not dilated by palpation. Back: No CVA tenderness Pulses 2+ Extremities: no clubbing cyanosis or edema, Homan's sign negative  Neurologic: grossly nonfocal Psychologic: normal affect and mood.  ECG (independently read by me): Sinus bradycardia 52 bpm.  QRS complex V1 V2.  Normal intervals.  June 2017 ECG (independently read by me): Normal sinus rhythm at 60 bpm.  First-degree AV block with a PR interval of 220 ms.  No significant ST changes.  Incomplete right bundle branch  block.   November 2016 ECG (independently read by me): normal sinus rhythm with first-degree heart block with a PR interval at 270 ms.  QRS complex V1 V2.  November 2015 ECG (independently read by me.  (: Normal sinus rhythm at 64 bpm.  Borderline first degree AV block with a PR interval at 204 ms.  Mild RV conduction delay.  Anteroseptal Q waves, unchanged  October 2014ECG: Sinus rhythm with incomplete right bundle branch block. Anteroseptal Q waves unchanged.  LABS:  BMP Latest Ref Rng & Units 11/21/2015 11/20/2015 11/07/2015  Glucose 65 - 99 mg/dL 150(H) 91 81  BUN 6 - 20 mg/dL _0 Creatinine 0.44 - 1.00 mg/dL 0.59 0.77 0.71  Sodium 135 - 145 mmol/L 139 141 141  Potassium 3.5 - 5.1 mmol/L 4.2 3.3(L) 3.5  Chloride 101 - 111 mmol/L 107 104 106  CO2 22 - 32 mmol/L _1 Calcium 8.9 - 10.3 mg/dL 8.9 9.0 9.9   Hepatic Function Latest Ref Rng & Units 11/07/2015 09/02/2015 02/28/2015  Total Protein 6.5 - 8.1 g/dL 6.2(L) 5.9(L) 6.1  Albumin 3.5 - 5.0 g/dL 4.0 3.7 3.9  AST 15 - 41 U/L 29 31 41(H)  ALT 14 - 54 U/L 26 27 32(H)  Alk Phosphatase 38 - 126 U/L 59 71 74  Total Bilirubin 0.3 - 1.2 mg/dL 0.4 0.4 0.7  Bilirubin, Direct 0.0 - 0.3 mg/dL - - -   CBC Latest Ref Rng & Units 11/21/2015 11/20/2015 11/07/2015  WBC 4.0 - 10.5 K/uL 9.1 6.6 5.0  Hemoglobin 12.0 - 15.0 g/dL 11.1(L) 11.8(L) 14.4  Hematocrit 36.0 - 46.0 % 35.0(L) 37.2 44.6  Platelets 150 - 400 K/uL 148(L) 159 202   Lab Results  Component Value Date   MCV 102.9 (H) 11/21/2015   MCV 104.2 (H) 11/20/2015   MCV 104.0 (H) 11/07/2015   Lab Results  Component Value Date   TSH 1.176 02/28/2015   Lipid Panel     Component Value Date/Time   CHOL 155 02/28/2015 1023   TRIG 150 (H) 02/28/2015 1023   HDL 47 02/28/2015 1023   CHOLHDL 3.3 02/28/2015 1023   VLDL 30 02/28/2015 1023   LDLCALC 78 02/28/2015 1023     RADIOLOGY: No results found.  ASSESSMENT AND PLAN: [name] is a 65 year old female who has a  history of SVTwhich had been fairly well-controlled on verapamil 240 mg as well as atenolol 25 twice a day. In the past, when she tried to reduce her dose or skips a dose intermittently she had noticed recurrent palpitations. Anecho Doppler study in 2011 showed normal systolic function and borderline mitral valve prolapse with trace MR. A nuclear study showed normal perfusion.  When last seen, I had reduced her atenolol dose due to bradycardia and orthostasis.  On this reduced dose.  She has not had any recurrent awareness of increased palpitations.  She tolerated her hip surgery well.  I reviewed her most recent echo Doppler study of 10/25/2015 and this is unchanged from 2011.  I reviewed her most recent dental x-rays which suggested carotid calcification.  I will schedule her for carotid duplex imaging for further evaluation.  In attempt to be more aggressive  I have suggested increasing her atorvastatin from her current dose of 5 mg total a least 10 mg.  Target L DL should be less than 70.  If she does have calcified carotid arteries.  I will see her in 6 months for reevaluation or sooner if problems arise.  Time spent: 25 minutes  Troy Sine, MD, [facility]  03/19/2016 11:54 AM

## 2016-04-03 ENCOUNTER — Other Ambulatory Visit: Payer: Self-pay | Admitting: Cardiovascular Disease

## 2016-04-03 DIAGNOSIS — I6523 Occlusion and stenosis of bilateral carotid arteries: Secondary | ICD-10-CM

## 2016-04-09 ENCOUNTER — Ambulatory Visit (HOSPITAL_COMMUNITY)
Admission: RE | Admit: 2016-04-09 | Discharge: 2016-04-09 | Disposition: A | Discharge status: Home / Self Care | Payer: Medicare Other | Source: Ambulatory Visit | Attending: Cardiology | Admitting: Cardiology

## 2016-04-09 DIAGNOSIS — I341 Nonrheumatic mitral (valve) prolapse: Secondary | ICD-10-CM | POA: Diagnosis not present

## 2016-04-09 DIAGNOSIS — E785 Hyperlipidemia, unspecified: Secondary | ICD-10-CM | POA: Diagnosis not present

## 2016-04-09 DIAGNOSIS — Z87891 Personal history of nicotine dependence: Secondary | ICD-10-CM | POA: Insufficient documentation

## 2016-04-09 DIAGNOSIS — I6523 Occlusion and stenosis of bilateral carotid arteries: Secondary | ICD-10-CM | POA: Insufficient documentation

## 2016-04-09 DIAGNOSIS — J449 Chronic obstructive pulmonary disease, unspecified: Secondary | ICD-10-CM | POA: Insufficient documentation

## 2016-04-29 ENCOUNTER — Encounter: Payer: Self-pay | Admitting: *Deleted

## 2016-05-06 DIAGNOSIS — L820 Inflamed seborrheic keratosis: Secondary | ICD-10-CM | POA: Diagnosis not present

## 2016-05-07 ENCOUNTER — Other Ambulatory Visit: Payer: Self-pay

## 2016-05-07 DIAGNOSIS — M47816 Spondylosis without myelopathy or radiculopathy, lumbar region: Secondary | ICD-10-CM | POA: Diagnosis not present

## 2016-05-07 DIAGNOSIS — G894 Chronic pain syndrome: Secondary | ICD-10-CM | POA: Diagnosis not present

## 2016-05-07 DIAGNOSIS — M546 Pain in thoracic spine: Secondary | ICD-10-CM | POA: Diagnosis not present

## 2016-05-07 DIAGNOSIS — M961 Postlaminectomy syndrome, not elsewhere classified: Secondary | ICD-10-CM | POA: Diagnosis not present

## 2016-05-07 MED ORDER — ATENOLOL 25 MG PO TABS: 25.0000 mg | ORAL_TABLET | Freq: Two times a day (BID) | ORAL | 3 refills | Status: DC

## 2016-05-12 ENCOUNTER — Telehealth: Payer: Self-pay | Admitting: Cardiovascular Disease

## 2016-05-12 MED ORDER — ATENOLOL 25 MG PO TABS: 12.5000 mg | ORAL_TABLET | Freq: Two times a day (BID) | ORAL | 3 refills | Status: DC

## 2016-05-12 NOTE — Telephone Encounter (Signed)
New message      Pt c/o medication issue:  1. Name of Medication: atenolol 2. How are you currently taking this medication (dosage and times per day)? Pt is taking 1/2 tab  bid  3. Are you having a reaction (difficulty breathing--STAT)? no 4. What is your medication issue? Prescription was written for 1 tablet bid.  Pt thinks presc is incorrect.  Please call

## 2016-05-12 NOTE — Telephone Encounter (Signed)
Returned call to pharmacy. Rx should read as 1/2 tab BID. Line rings busy. Resubmitted refill electronically.

## 2016-05-27 ENCOUNTER — Other Ambulatory Visit: Payer: Self-pay

## 2016-05-27 MED ORDER — ATORVASTATIN CALCIUM 10 MG PO TABS: 5.0000 mg | ORAL_TABLET | Freq: Every day | ORAL | 3 refills | Status: DC

## 2016-05-27 MED ORDER — VERAPAMIL HCL ER 240 MG PO TBCR: EXTENDED_RELEASE_TABLET | ORAL | 3 refills | Status: DC

## 2016-07-30 DIAGNOSIS — G4700 Insomnia, unspecified: Secondary | ICD-10-CM | POA: Diagnosis not present

## 2016-07-30 DIAGNOSIS — M47816 Spondylosis without myelopathy or radiculopathy, lumbar region: Secondary | ICD-10-CM | POA: Diagnosis not present

## 2016-07-30 DIAGNOSIS — M961 Postlaminectomy syndrome, not elsewhere classified: Secondary | ICD-10-CM | POA: Diagnosis not present

## 2016-07-30 DIAGNOSIS — G894 Chronic pain syndrome: Secondary | ICD-10-CM | POA: Diagnosis not present

## 2016-09-14 ENCOUNTER — Ambulatory Visit (INDEPENDENT_AMBULATORY_CARE_PROVIDER_SITE_OTHER): Payer: Medicare Other | Admitting: Cardiovascular Disease

## 2016-09-14 ENCOUNTER — Encounter: Payer: Self-pay | Admitting: Cardiovascular Disease

## 2016-09-14 VITALS — BP 92/58 | HR 56 | Ht 67.0 in | Wt 110.4 lb

## 2016-09-14 DIAGNOSIS — Z87891 Personal history of nicotine dependence: Secondary | ICD-10-CM

## 2016-09-14 DIAGNOSIS — E7800 Pure hypercholesterolemia, unspecified: Secondary | ICD-10-CM

## 2016-09-14 DIAGNOSIS — I471 Supraventricular tachycardia: Secondary | ICD-10-CM

## 2016-09-14 DIAGNOSIS — R001 Bradycardia, unspecified: Secondary | ICD-10-CM

## 2016-09-14 DIAGNOSIS — Z79899 Other long term (current) drug therapy: Secondary | ICD-10-CM | POA: Diagnosis not present

## 2016-09-14 DIAGNOSIS — I059 Rheumatic mitral valve disease, unspecified: Secondary | ICD-10-CM

## 2016-09-14 DIAGNOSIS — I6529 Occlusion and stenosis of unspecified carotid artery: Secondary | ICD-10-CM | POA: Diagnosis not present

## 2016-09-14 MED ORDER — ATORVASTATIN CALCIUM 10 MG PO TABS: 10.0000 mg | ORAL_TABLET | Freq: Every day | ORAL | 3 refills | Status: DC

## 2016-09-14 MED ORDER — VARENICLINE TARTRATE 0.5 MG X 11 & 1 MG X 42 PO MISC: ORAL | 0 refills | Status: DC

## 2016-09-14 NOTE — Patient Instructions (Addendum)
Your physician recommends that you return for lab work fasting. You may have these done in [place]. Please be sure to make sure that Dr Claiborne Billings gets these results.  Your physician has recommended you make the following change in your medication  1.) at your request Dr Claiborne Billings has given you a prescription for Chantix. Take as directed on the prescription.  Your physician wants you to follow-up in: 1 year or sooner if needed. You will receive a reminder letter in the mail two months in advance. If you don't receive a letter, please call our office to schedule the follow-up appointment.  If you need a refill on your cardiac medications before your next appointment, please call your pharmacy.

## 2016-09-14 NOTE — Progress Notes (Signed)
Patient ID: [name], female   DOB: 1950-07-26, 66 y.o.   MRN: [id number]    PCP: Dr. Gilford Rile  HPI: [name] is a 66 y.o. female resents to the office today for a 6 month followup cardiology evaluation.   [name] has a history of supraventricular tachycardia which has been fairly well-controlled on combination verapamil SR  plus beta blocker therapy with atenolol.  She has a history of underlying mitral valve prolapse, mild carotid disease, hyperlipidemia, as well as mild COPD. She had smoked for over 30 years but quit smoking approximately 7 years ago.  Five years ago, she was hospitalized after being bitten by a copperhead snake and required 5 days in the hospital. Apparently her cardiac medicines were held at that time and during the hospitalization she had recurrent episodes of tachycardia dysrhythmia. As long as she takes her medicines, and she feels that her heart rhythm is controlled. There is rare intermittent palpitations.typically, these episodes may last for under a minute and typically resolve with the Valsalva maneuver if they don't resolve spontaneously.  When I  saw her in 2017 she was unaware of any breakthrough arrhythmias.  At that time her blood pressure was low and there was mild orthostatic drop.  Her resting pulse was 60.  I recommended that she continue to take Verapamil SR 240 mg but I reduced her atenolol dose to 12.5  mg twice a day.  She underwent a follow-up echo Doppler study on 10/25/2015.  This showed an EF of 60-65%.  There was normal wall motion.  There was no significant valvular pathology.  She was given preoperative clearance to undergo hip surgery by Dr. Fredonia Highland which was done in July 2017 and she tolerated this well.  I last saw her, she had undergone  a panoramic dental x-ray.  Incidentally, she was noted to have calcified carotid atheromas of the internal carotid artery suggestive of atherosclerotic disease.  I scheduled her for carotid duplex  imaging which showed heterogeneous plaque bilaterally with narrowing in the 1-39% range.  She had normal subclavian arteries, and patent vertebral arteries with antegrade flow.  Since I last saw her, unfortunately she resumed smoking evident off cigarettes for 2 years.  She states she started this after a death of a family member.  She is now smoking one half pack per day.  She denies chest pain.  She denies palpitations.  She presents for evaluation.   Past Medical History:  Diagnosis Date  . Anxiety   . Arthritis   . Back pain, chronic   . Complication of anesthesia    per patient "difficult stick- needs central line or picc"  . COPD (chronic obstructive pulmonary disease) (HCC)    emphysema no meds  . Depression   . Dysrhythmia    hx svt  . Heart murmur   . IBS (irritable bowel syndrome)    flairs up occasionally esp if stressed  . PONV (postoperative nausea and vomiting)    nausea occ  . PSVT (paroxysmal supraventricular tachycardia) ([place])     Past Surgical History:  Procedure Laterality Date  . BACK SURGERY     16 back surgery ,neck and lumbar fused  . BREAST SURGERY  1976   breast reduction  . CERVICAL DISC SURGERY    . COLECTOMY    . KNEE SURGERY     bone spur  66 yrs old  . SPINE SURGERY  1983-01/2007  . TONSILLECTOMY    . TOTAL  HIP ARTHROPLASTY Right 07/31/2014   Procedure: TOTAL HIP ARTHROPLASTY ANTERIOR APPROACH;  Surgeon: Renette Butters, MD;  Location: [facility];  Service: Orthopedics;  Laterality: Right;  . TOTAL HIP ARTHROPLASTY Left 11/19/2015  . TOTAL HIP ARTHROPLASTY Left 11/19/2015   Procedure: TOTAL HIP ARTHROPLASTY ANTERIOR APPROACH;  Surgeon: Renette Butters, MD;  Location: [place];  Service: Orthopedics;  Laterality: Left;  . TUBAL LIGATION      Allergies  Allergen Reactions  . Bupropion Hcl Swelling  . Celecoxib Swelling  . Naproxen Swelling  . Methadone Nausea And Vomiting  . Morphine Itching    Current Outpatient Prescriptions  Medication Sig  Dispense Refill  . aspirin EC 81 MG tablet Take 81 mg by mouth at bedtime.    Marland Kitchen atenolol (TENORMIN) 25 MG tablet Take 0.5 tablets (12.5 mg total) by mouth 2 (two) times daily. 90 tablet 3  . atorvastatin (LIPITOR) 10 MG tablet Take 1 tablet (10 mg total) by mouth daily. 90 tablet 3  . baclofen (LIORESAL) 20 MG tablet Take 20 mg by mouth 3 (three) times daily as needed for muscle spasms.     . Calcium Carbonate-Vitamin D (CALCIUM + D PO) Take 1 tablet by mouth 2 (two) times daily. Calcium Magnesium Zinc and Vitamin D    . clonazePAM (KLONOPIN) 1 MG tablet Take 1 mg by mouth at bedtime.    . fish oil-omega-3 fatty acids 1000 MG capsule Take 1 g by mouth daily.     . Multiple Vitamin (MULTIVITAMIN WITH MINERALS) TABS tablet Take 1 tablet by mouth daily.    . sertraline (ZOLOFT) 100 MG tablet Take 50 mg by mouth daily.     . verapamil (CALAN-SR) 240 MG CR tablet TAKE 1 TABLET AT BEDTIME. 90 tablet 3  . varenicline (CHANTIX STARTING MONTH PAK) 0.5 MG X 11 & 1 MG X 42 tablet Take one 0.5 mg tablet by mouth once daily for 3 days, then increase to one 0.5 mg tablet twice daily for 4 days, then increase to one 1 mg tablet twice daily. 53 tablet 0   No current facility-administered medications for this visit.     Social History   Social History  . Marital status: Married    Spouse name: N/A  . Number of children: N/A  . Years of education: N/A   Occupational History  . Not on file.   Social History Main Topics  . Smoking status: Former Smoker    Types: Cigarettes    Quit date: 04/05/2016  . Smokeless tobacco: Former Systems developer  . Alcohol use 0.0 oz/week    5 - 6 Glasses of wine per week     Comment: occasional  . Drug use: No  . Sexual activity: Yes   Other Topics Concern  . Not on file   Social History Narrative  . No narrative on file   Social history is notable in that she is married. She has one stepchild and 2 stepgrandchildren. She does walk. She has resumed tobacco.  Family  History  Problem Relation Age of Onset  . COPD Father   . Diabetes Mother   . Heart disease Mother   . Kidney disease Mother   . Hypertension Mother     ROS General: Negative; No fevers, chills, or night sweats;  HEENT: Negative; No changes in vision or hearing, sinus congestion, difficulty swallowing Pulmonary: positive for mild COPD; No cough, wheezing, shortness of breath, hemoptysis Cardiovascular: Negative; No chest pain, presyncope, syncope, palpitations GI: Positive  for irritable bowel syndrome GU: Negative; No dysuria, hematuria, or difficulty voiding Musculoskeletal: Status post left hip replacement surgery. Hematologic/Oncology: Negative; no easy bruising, bleeding Endocrine: Negative; no heat/cold intolerance; no diabetes Neuro: Negative; no changes in balance, headaches Skin: Negative; No rashes or skin lesions Psychiatric: positive for mild depression for which she takes Zoloft.; No behavioral problems,  Sleep: Negative; No snoring, daytime sleepiness, hypersomnolence, bruxism, restless legs, hypnogognic hallucinations, no cataplexy Other comprehensive 14 point system review is negative.   PE BP (!) 92/58   Pulse (!) 56   Ht 5' 7" (1.702 m)   Wt 110 lb 6.4 oz (50.1 kg)   BMI 17.29 kg/m    Repeat blood pressure by me was 112/60   Wt Readings from Last 3 Encounters:  09/14/16 110 lb 6.4 oz (50.1 kg)  03/17/16 110 lb (49.9 kg)  11/19/15 120 lb (54.4 kg)   General: Alert, oriented, no distress.  Skin: normal turgor, no rashes, warm and dry HEENT: Normocephalic, atraumatic. Pupils equal round and reactive to light; sclera anicteric; extraocular muscles intact;  Nose without nasal septal hypertrophy Mouth/Parynx benign; Mallinpatti scale 2 Neck: No JVD, no carotid bruits; normal carotid upstroke Lungs: clear to ausculatation and percussion; no wheezing or rales Chest wall: without tenderness to palpitation Heart: PMI not displaced, RRR, s1 s2 normal, 1/6  systolic murmur, no diastolic murmur, intermittent systolic click consistent with mitral valve prolapse; no rubs, gallops, thrills, or heaves Abdomen: soft, nontender; no hepatosplenomehaly, BS+; abdominal aorta nontender and not dilated by palpation. Back: no CVA tenderness Pulses 2+ Musculoskeletal: full range of motion, normal strength, no joint deformities Extremities: no clubbing cyanosis or edema, Homan's sign negative  Neurologic: grossly nonfocal; Cranial nerves grossly wnl Psychologic: Normal mood and affect  ECG (independently read by me): Sinus bradycardia 56 bpm.  QS V1, V2.  Mild RV conduction delay.  Normal intervals.  No ST segment changes.  November 2017 ECG (independently read by me): Sinus bradycardia 52 bpm.  QRS complex V1 V2.  Normal intervals.  June 2017 ECG (independently read by me): Normal sinus rhythm at 60 bpm.  First-degree AV block with a PR interval of 220 ms.  No significant ST changes.  Incomplete right bundle branch block.   November 2016 ECG (independently read by me): normal sinus rhythm with first-degree heart block with a PR interval at 270 ms.  QRS complex V1 V2.  November 2015 ECG (independently read by me.  (: Normal sinus rhythm at 64 bpm.  Borderline first degree AV block with a PR interval at 204 ms.  Mild RV conduction delay.  Anteroseptal Q waves, unchanged  October 2014ECG: Sinus rhythm with incomplete right bundle branch block. Anteroseptal Q waves unchanged.  LABS:  BMP Latest Ref Rng & Units 11/21/2015 11/20/2015 11/07/2015  Glucose 65 - 99 mg/dL 150(H) 91 81  BUN 6 - 20 mg/dL _0 Creatinine 0.44 - 1.00 mg/dL 0.59 0.77 0.71  Sodium 135 - 145 mmol/L 139 141 141  Potassium 3.5 - 5.1 mmol/L 4.2 3.3(L) 3.5  Chloride 101 - 111 mmol/L 107 104 106  CO2 22 - 32 mmol/L _1 Calcium 8.9 - 10.3 mg/dL 8.9 9.0 9.9   Hepatic Function Latest Ref Rng & Units 11/07/2015 09/02/2015 02/28/2015  Total Protein 6.5 - 8.1 g/dL 6.2(L) 5.9(L) 6.1    Albumin 3.5 - 5.0 g/dL 4.0 3.7 3.9  AST 15 - 41 U/L 29 31 41(H)  ALT 14 - 54 U/L 26 27 32(H)  Alk Phosphatase 38 - 126 U/L 59 71 74  Total Bilirubin 0.3 - 1.2 mg/dL 0.4 0.4 0.7  Bilirubin, Direct 0.0 - 0.3 mg/dL - - -   CBC Latest Ref Rng & Units 11/21/2015 11/20/2015 11/07/2015  WBC 4.0 - 10.5 K/uL 9.1 6.6 5.0  Hemoglobin 12.0 - 15.0 g/dL 11.1(L) 11.8(L) 14.4  Hematocrit 36.0 - 46.0 % 35.0(L) 37.2 44.6  Platelets 150 - 400 K/uL 148(L) 159 202   Lab Results  Component Value Date   MCV 102.9 (H) 11/21/2015   MCV 104.2 (H) 11/20/2015   MCV 104.0 (H) 11/07/2015   Lab Results  Component Value Date   TSH 1.176 02/28/2015   Lipid Panel     Component Value Date/Time   CHOL 155 02/28/2015 1023   TRIG 150 (H) 02/28/2015 1023   HDL 47 02/28/2015 1023   CHOLHDL 3.3 02/28/2015 1023   VLDL 30 02/28/2015 1023   LDLCALC 78 02/28/2015 1023     RADIOLOGY: No results found.  IMPRESSION:  1. Mitral valve disorder   2. History of tobacco abuse   3. Pure hypercholesterolemia   4. Medication management   5. Carotid artery calcification, unspecified laterality   6. Sinus bradycardia   7. SVT (supraventricular tachycardia) (HCC)     ASSESSMENT AND PLAN: [name] is a 66 year old female who has a history of SVTwhich had been fairly well-controlled on verapamil 240 mg as well as atenolol 25 twice a day. In the past, when she tried to reduce her dose or skips a dose intermittently she had noticed recurrent palpitations. An echo Doppler study in 2011 showed normal systolic function and borderline mitral valve prolapse with trace MR. A nuclear study showed normal perfusion.  Her atenolol dose had been reduced secondary to bradycardia and orthostasis.  He has tolerated low-dose at 12.5 twice a day and although she remains bradycardic.  She is asymptomatic.  Her last echo Doppler study of 10/25/2015 was unchanged from 2011.  She has been documented to have mild heterogeneous bilateral carotid  plaque without significant stenosis.  I discussed the importance of smoking cessation. I  have given her a prescription for Chantix, which had worked previously.  She is unaware of any further episodes of tachycardia.  She is taking atorvastatin only at 10 mg daily.  With her documented carotid plaque target LDL is less than 70.  I will see her in one-year for reevaluation or sooner if problem arise.  Time spent: 25 minutes  Troy Sine, MD, [facility]  09/16/2016 5:23 PM

## 2016-10-02 DIAGNOSIS — H524 Presbyopia: Secondary | ICD-10-CM | POA: Diagnosis not present

## 2016-10-02 DIAGNOSIS — H5203 Hypermetropia, bilateral: Secondary | ICD-10-CM | POA: Diagnosis not present

## 2016-10-02 DIAGNOSIS — H2513 Age-related nuclear cataract, bilateral: Secondary | ICD-10-CM | POA: Diagnosis not present

## 2016-10-02 DIAGNOSIS — D3131 Benign neoplasm of right choroid: Secondary | ICD-10-CM | POA: Diagnosis not present

## 2016-11-02 ENCOUNTER — Other Ambulatory Visit: Payer: Self-pay | Admitting: Cardiovascular Disease

## 2016-11-02 ENCOUNTER — Other Ambulatory Visit: Payer: Self-pay

## 2016-11-02 DIAGNOSIS — G4700 Insomnia, unspecified: Secondary | ICD-10-CM | POA: Diagnosis not present

## 2016-11-02 DIAGNOSIS — M47816 Spondylosis without myelopathy or radiculopathy, lumbar region: Secondary | ICD-10-CM | POA: Diagnosis not present

## 2016-11-02 DIAGNOSIS — M961 Postlaminectomy syndrome, not elsewhere classified: Secondary | ICD-10-CM | POA: Diagnosis not present

## 2016-11-02 DIAGNOSIS — G894 Chronic pain syndrome: Secondary | ICD-10-CM | POA: Diagnosis not present

## 2016-11-02 MED ORDER — VARENICLINE TARTRATE 0.5 MG X 11 & 1 MG X 42 PO MISC: ORAL | 0 refills | Status: DC

## 2016-11-02 NOTE — Telephone Encounter (Signed)
New message     *STAT* If patient is at the pharmacy, call can be transferred to refill team.   1. Which medications need to be refilled? (please list name of each medication and dose if known) varenicline (CHANT IX STARTING MONTH PAK) 0.5 MG X 11 & 1 MG X 42 tablet  2. Which pharmacy/location (including street and city if local pharmacy) is medication to be sent to?[place]   3. Do they need a 30 day or 90 day supply? Should patient get continues pak instead of month pak.

## 2016-11-03 DIAGNOSIS — Z79899 Other long term (current) drug therapy: Secondary | ICD-10-CM | POA: Diagnosis not present

## 2016-11-03 DIAGNOSIS — I059 Rheumatic mitral valve disease, unspecified: Secondary | ICD-10-CM | POA: Diagnosis not present

## 2016-11-03 DIAGNOSIS — E7800 Pure hypercholesterolemia, unspecified: Secondary | ICD-10-CM | POA: Diagnosis not present

## 2016-11-03 NOTE — Telephone Encounter (Signed)
Left Message-is pt restarting or is this for con't month refill?

## 2016-11-04 ENCOUNTER — Other Ambulatory Visit: Payer: Self-pay | Admitting: Cardiovascular Disease

## 2016-11-04 LAB — COMPREHENSIVE METABOLIC PANEL
ALBUMIN: 4.4 g/dL (ref 3.6–4.8)
ALT: 22 IU/L (ref 0–32)
AST: 25 IU/L (ref 0–40)
Albumin/Globulin Ratio: 2.1 (ref 1.2–2.2)
Alkaline Phosphatase: 73 IU/L (ref 39–117)
BILIRUBIN TOTAL: 0.4 mg/dL (ref 0.0–1.2)
BUN / CREAT RATIO: 23 (ref 12–28)
BUN: 19 mg/dL (ref 8–27)
CALCIUM: 10.1 mg/dL (ref 8.7–10.3)
CO2: 27 mmol/L (ref 20–29)
CREATININE: 0.82 mg/dL (ref 0.57–1.00)
Chloride: 103 mmol/L (ref 96–106)
GFR, EST AFRICAN AMERICAN: 88 mL/min/{1.73_m2} (ref >59)
GFR, EST NON AFRICAN AMERICAN: 76 mL/min/{1.73_m2} (ref >59)
Globulin, Total: 2.1 g/dL (ref 1.5–4.5)
Glucose: 83 mg/dL (ref 65–99)
Potassium: 4.6 mmol/L (ref 3.5–5.2)
Sodium: 144 mmol/L (ref 134–144)
TOTAL PROTEIN: 6.5 g/dL (ref 6.0–8.5)

## 2016-11-04 LAB — CBC
HEMOGLOBIN: 15.5 g/dL (ref 11.1–15.9)
Hematocrit: 47.2 % — ABNORMAL HIGH (ref 34.0–46.6)
MCH: 32.6 pg (ref 26.6–33.0)
MCHC: 32.8 g/dL (ref 31.5–35.7)
MCV: 99 fL — ABNORMAL HIGH (ref 79–97)
PLATELETS: 212 10*3/uL (ref 150–379)
RBC: 4.76 x10E6/uL (ref 3.77–5.28)
RDW: 13.2 % (ref 12.3–15.4)
WBC: 5.6 10*3/uL (ref 3.4–10.8)

## 2016-11-04 LAB — LIPID PANEL
CHOL/HDL RATIO: 3.2 ratio (ref 0.0–4.4)
Cholesterol, Total: 146 mg/dL (ref 100–199)
HDL: 46 mg/dL (ref >39)
LDL CALC: 78 mg/dL (ref 0–99)
Triglycerides: 111 mg/dL (ref 0–149)
VLDL CHOLESTEROL CAL: 22 mg/dL (ref 5–40)

## 2016-11-04 LAB — TSH: TSH: 1.01 u[IU]/mL (ref 0.450–4.500)

## 2016-11-04 MED ORDER — VARENICLINE TARTRATE 1 MG PO TABS: 1.0000 mg | ORAL_TABLET | Freq: Two times a day (BID) | ORAL | 2 refills | Status: DC

## 2016-11-04 NOTE — Telephone Encounter (Signed)
Informed patient that Chantix was sent over to the pharmacy and they would contact her when it was ready for pick up. Patient voiced understanding.

## 2016-11-04 NOTE — Telephone Encounter (Signed)
New message      Returning a call to the nurse regarding chantix refill

## 2016-11-06 NOTE — Telephone Encounter (Signed)
Spoke with pharmacist, issue has been resolved.

## 2016-11-27 DIAGNOSIS — M549 Dorsalgia, unspecified: Secondary | ICD-10-CM | POA: Diagnosis not present

## 2016-11-27 DIAGNOSIS — M961 Postlaminectomy syndrome, not elsewhere classified: Secondary | ICD-10-CM | POA: Diagnosis not present

## 2016-11-27 DIAGNOSIS — M4726 Other spondylosis with radiculopathy, lumbar region: Secondary | ICD-10-CM | POA: Diagnosis not present

## 2016-11-27 DIAGNOSIS — M542 Cervicalgia: Secondary | ICD-10-CM | POA: Diagnosis not present

## 2016-11-27 DIAGNOSIS — M4150 Other secondary scoliosis, site unspecified: Secondary | ICD-10-CM | POA: Diagnosis not present

## 2016-12-04 DIAGNOSIS — Z9289 Personal history of other medical treatment: Secondary | ICD-10-CM | POA: Diagnosis not present

## 2016-12-04 DIAGNOSIS — Z1159 Encounter for screening for other viral diseases: Secondary | ICD-10-CM | POA: Diagnosis not present

## 2016-12-04 DIAGNOSIS — Z0000 Encounter for general adult medical examination without abnormal findings: Secondary | ICD-10-CM | POA: Diagnosis not present

## 2016-12-05 DIAGNOSIS — M5134 Other intervertebral disc degeneration, thoracic region: Secondary | ICD-10-CM | POA: Diagnosis not present

## 2016-12-05 DIAGNOSIS — M4712 Other spondylosis with myelopathy, cervical region: Secondary | ICD-10-CM | POA: Diagnosis not present

## 2016-12-05 DIAGNOSIS — M4726 Other spondylosis with radiculopathy, lumbar region: Secondary | ICD-10-CM | POA: Diagnosis not present

## 2016-12-05 DIAGNOSIS — M4804 Spinal stenosis, thoracic region: Secondary | ICD-10-CM | POA: Diagnosis not present

## 2016-12-05 DIAGNOSIS — M542 Cervicalgia: Secondary | ICD-10-CM | POA: Diagnosis not present

## 2016-12-05 DIAGNOSIS — M545 Low back pain: Secondary | ICD-10-CM | POA: Diagnosis not present

## 2016-12-05 DIAGNOSIS — Z981 Arthrodesis status: Secondary | ICD-10-CM | POA: Diagnosis not present

## 2016-12-05 DIAGNOSIS — M48061 Spinal stenosis, lumbar region without neurogenic claudication: Secondary | ICD-10-CM | POA: Diagnosis not present

## 2016-12-25 DIAGNOSIS — M542 Cervicalgia: Secondary | ICD-10-CM | POA: Diagnosis not present

## 2016-12-25 DIAGNOSIS — M961 Postlaminectomy syndrome, not elsewhere classified: Secondary | ICD-10-CM | POA: Diagnosis not present

## 2016-12-25 DIAGNOSIS — M549 Dorsalgia, unspecified: Secondary | ICD-10-CM | POA: Diagnosis not present

## 2016-12-29 DIAGNOSIS — G4700 Insomnia, unspecified: Secondary | ICD-10-CM | POA: Diagnosis not present

## 2016-12-29 DIAGNOSIS — M961 Postlaminectomy syndrome, not elsewhere classified: Secondary | ICD-10-CM | POA: Diagnosis not present

## 2016-12-29 DIAGNOSIS — M47816 Spondylosis without myelopathy or radiculopathy, lumbar region: Secondary | ICD-10-CM | POA: Diagnosis not present

## 2016-12-29 DIAGNOSIS — G894 Chronic pain syndrome: Secondary | ICD-10-CM | POA: Diagnosis not present

## 2017-01-18 DIAGNOSIS — Z1231 Encounter for screening mammogram for malignant neoplasm of breast: Secondary | ICD-10-CM | POA: Diagnosis not present

## 2017-01-18 DIAGNOSIS — Z130 Encounter for screening for diseases of the blood and blood-forming organs and certain disorders involving the immune mechanism: Secondary | ICD-10-CM | POA: Diagnosis not present

## 2017-01-18 DIAGNOSIS — Z1389 Encounter for screening for other disorder: Secondary | ICD-10-CM | POA: Diagnosis not present

## 2017-01-18 DIAGNOSIS — K623 Rectal prolapse: Secondary | ICD-10-CM | POA: Diagnosis not present

## 2017-01-18 DIAGNOSIS — Z124 Encounter for screening for malignant neoplasm of cervix: Secondary | ICD-10-CM | POA: Diagnosis not present

## 2017-01-19 DIAGNOSIS — Z124 Encounter for screening for malignant neoplasm of cervix: Secondary | ICD-10-CM | POA: Diagnosis not present

## 2017-01-28 DIAGNOSIS — J300 Vasomotor rhinitis: Secondary | ICD-10-CM | POA: Diagnosis not present

## 2017-01-29 DIAGNOSIS — R300 Dysuria: Secondary | ICD-10-CM | POA: Diagnosis not present

## 2017-01-29 DIAGNOSIS — Z23 Encounter for immunization: Secondary | ICD-10-CM | POA: Diagnosis not present

## 2017-02-02 DIAGNOSIS — R195 Other fecal abnormalities: Secondary | ICD-10-CM | POA: Diagnosis not present

## 2017-02-02 DIAGNOSIS — R159 Full incontinence of feces: Secondary | ICD-10-CM | POA: Diagnosis not present

## 2017-02-02 DIAGNOSIS — K589 Irritable bowel syndrome without diarrhea: Secondary | ICD-10-CM | POA: Diagnosis not present

## 2017-02-08 DIAGNOSIS — R339 Retention of urine, unspecified: Secondary | ICD-10-CM | POA: Diagnosis not present

## 2017-02-08 DIAGNOSIS — N390 Urinary tract infection, site not specified: Secondary | ICD-10-CM | POA: Diagnosis not present

## 2017-02-17 ENCOUNTER — Other Ambulatory Visit: Payer: Self-pay | Admitting: *Deleted

## 2017-02-17 DIAGNOSIS — I6523 Occlusion and stenosis of bilateral carotid arteries: Secondary | ICD-10-CM

## 2017-02-23 DIAGNOSIS — K6289 Other specified diseases of anus and rectum: Secondary | ICD-10-CM | POA: Diagnosis not present

## 2017-02-23 DIAGNOSIS — R159 Full incontinence of feces: Secondary | ICD-10-CM | POA: Diagnosis not present

## 2017-02-26 NOTE — Progress Notes (Signed)
Talked with patient to confirm appontment for Monday 03/01/2017 for Anal manometry at 12:30 at [facility] . Pt stated seh will be at appointment.

## 2017-03-01 ENCOUNTER — Encounter (HOSPITAL_COMMUNITY): Payer: Self-pay

## 2017-03-01 ENCOUNTER — Ambulatory Visit (HOSPITAL_COMMUNITY)
Admission: RE | Admit: 2017-03-01 | Discharge: 2017-03-01 | Disposition: A | Discharge status: Home / Self Care | Payer: Medicare Other | Source: Ambulatory Visit | Attending: General Surgery | Admitting: General Surgery

## 2017-03-01 ENCOUNTER — Encounter (HOSPITAL_COMMUNITY)
Admission: RE | Disposition: A | Discharge status: Home / Self Care | Payer: Self-pay | Source: Ambulatory Visit | Attending: General Surgery

## 2017-03-01 DIAGNOSIS — K6289 Other specified diseases of anus and rectum: Secondary | ICD-10-CM | POA: Insufficient documentation

## 2017-03-01 DIAGNOSIS — R32 Unspecified urinary incontinence: Secondary | ICD-10-CM | POA: Insufficient documentation

## 2017-03-01 HISTORY — PX: ANAL RECTAL MANOMETRY: SHX6358

## 2017-03-01 SURGERY — MANOMETRY, ANORECTAL

## 2017-03-01 NOTE — Progress Notes (Signed)
Anorectal manometry done per protocol.  Patient tolerated well, w/o complication. Balloon expulsion test done after education given and consent from patient.  Balloon placed inside rectal area at 9cm, 50cc's warm water placed inside balloon.  Patient able to expel balloon in 3 seconds.  Dr. Marcello Moores to be made aware of study today.  Laverta Baltimore, RN

## 2017-03-02 ENCOUNTER — Encounter (HOSPITAL_COMMUNITY): Payer: Self-pay | Admitting: General Surgery

## 2017-03-09 DIAGNOSIS — R339 Retention of urine, unspecified: Secondary | ICD-10-CM | POA: Diagnosis not present

## 2017-03-09 DIAGNOSIS — R159 Full incontinence of feces: Secondary | ICD-10-CM | POA: Diagnosis not present

## 2017-03-09 DIAGNOSIS — N390 Urinary tract infection, site not specified: Secondary | ICD-10-CM | POA: Diagnosis not present

## 2017-03-10 DIAGNOSIS — R159 Full incontinence of feces: Secondary | ICD-10-CM | POA: Diagnosis not present

## 2017-03-23 ENCOUNTER — Ambulatory Visit: Payer: Self-pay | Admitting: General Surgery

## 2017-03-23 DIAGNOSIS — K6289 Other specified diseases of anus and rectum: Secondary | ICD-10-CM | POA: Diagnosis not present

## 2017-03-23 DIAGNOSIS — R159 Full incontinence of feces: Secondary | ICD-10-CM | POA: Diagnosis not present

## 2017-03-23 NOTE — H&P (View-Only) (Signed)
History of Present Illness Leighton Ruff MD; 99/83/3825 1:04 PM) The patient is a 66 year old female who presents with a complaint of anal problems. 66 year old female who presents to the office with complaints of mucosal drainage via rectum. She also has occasional fecal incontinence approximately once a week or more. She has a h/o IBS-D and takes a fiber supplement for this on a daily basis. This causes her to have regular bowel habits and formed stool but she continues to have leakage. She has also tried taking Imodium to bulk up her stool but this does not decrease her mucus drainage. She underwent an abdominal rectopexy with Dr. [facility]equ[facility] in 2011. She complains of prolapsing tissue with bowel movements. Recently she has tried a probiotic and a peppermint extract which she feels has decrease the mucus drainage in her rectum. She continues to have fecal incontinence though.   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 Hip Surgery Bilateral. Oral Surgery Spinal Surgery - Lower Back Spinal Surgery - Neck  Diagnostic Studies History Leighton Ruff, MD; 40/97/3532 1:04 PM) Colonoscopy 1-5 years ago Mammogram within last year Pap Smear 1-5 years ago  Allergies Leighton Ruff, MD; 99/24/2683 1:04 PM) Amoxicillin-Pot Clavulanate *CHEMICALS* Augmentin *PENICILLINS* BuPROPion HCl *CHEMICALS* CeleBREX *ANALGESICS - ANTI-INFLAMMATORY* METHADONE MORPHINE NAPROXEN Wellbutrin *ANTIDEPRESSANTS*  Medication History (Armen Glenn, CMA; 03/23/2017 11:39 AM) Cephalexin (250MG  Capsule, Oral) Active. ClonazePAM (1MG  Tablet, Oral) Active. Atenolol (25MG  Tablet, Oral) Active. Atorvastatin Calcium (10MG  Tablet, Oral) Active. Aspirin (81MG  Tablet, Oral) Active. Baclofen (20MG  Tablet, Oral) Active. Fish  Oil (1200MG  Capsule, Oral) Active. Multivitamin Adult (Oral) Active. Verapamil HCl ER (240MG  Tablet ER, Oral) Active. Dicyclomine HCl (20MG  Tablet, Oral) Active. Phenazopyridine HCl (200MG  Tablet, Oral) Active. Urecholine (25MG  Tablet, Oral) Active. Medications Reconciled  Social History Leighton Ruff, MD; 41/96/2229 1:04 PM) Alcohol use Occasional alcohol use. Caffeine use Tea. No drug use Tobacco use Current some day smoker.  Family History Leighton Ruff, MD; 79/89/2119 1:04 PM) Colon Polyps Mother. Diabetes Mellitus Mother. Heart Disease Mother. Heart disease in female family member before age 5 Hypertension Mother. Kidney Disease Mother. Respiratory Condition Father.  Pregnancy / Birth History Leighton Ruff, MD; 41/74/0814 1:04 PM) Age at menarche 89 years. Age of menopause 42-50 Contraceptive History Oral contraceptives. Gravida 0 Para 0  Other Problems Leighton Ruff, MD; 48/18/5631 1:04 PM) Back Pain Heart murmur     Review of Systems Leighton Ruff MD; 49/70/2637 1:05 PM) General Present- Appetite Loss. Not Present- Chills, Fatigue, Fever, Night Sweats, Weight Gain and Weight Loss. Skin Not Present- Change in Wart/Mole, Dryness, Hives, Jaundice, New Lesions, Non-Healing Wounds, Rash and Ulcer. HEENT Present- Wears glasses/contact lenses. Not Present- Earache, Hearing Loss, Hoarseness, Nose Bleed, Oral Ulcers, Ringing in the Ears, Seasonal Allergies, Sinus Pain, Sore Throat, Visual Disturbances and Yellow Eyes. Respiratory Not Present- Bloody sputum, Chronic Cough, Difficulty Breathing, Snoring and Wheezing. Breast Not Present- Breast Mass, Breast Pain, Nipple Discharge and Skin Changes. Cardiovascular Not Present- Chest Pain, Difficulty Breathing Lying Down, Leg Cramps, Palpitations, Rapid Heart Rate, Shortness of Breath and Swelling of Extremities. Gastrointestinal Present- Change in Bowel Habits and Rectal Pain. Not Present- Abdominal  Pain, Bloating, Bloody Stool, Chronic diarrhea, Constipation, Difficulty Swallowing, Excessive gas, Gets full quickly at meals, Hemorrhoids, Indigestion, Nausea and Vomiting. Female Genitourinary Present- Frequency. Not Present- Nocturia, Painful Urination, Pelvic Pain and Urgency. Musculoskeletal Not Present- Back Pain, Joint Pain,  Joint Stiffness, Muscle Pain, Muscle Weakness and Swelling of Extremities. Neurological Not Present- Decreased Memory, Fainting, Headaches, Numbness, Seizures, Tingling, Tremor, Trouble walking and Weakness. Psychiatric Not Present- Anxiety, Bipolar, Change in Sleep Pattern, Depression, Fearful and Frequent crying. Endocrine Not Present- Cold Intolerance, Excessive Hunger, Hair Changes, Heat Intolerance, Hot flashes and New Diabetes. Hematology Not Present- Blood Thinners, Easy Bruising, Excessive bleeding, Gland problems, HIV and Persistent Infections.  Vitals (Armen Glenn CMA; 03/23/2017 11:37 AM) 03/23/2017 11:37 AM Weight: 112 lb Height: 68in Body Surface Area: 1.6 m Body Mass Index: 17.03 kg/m  Temp.: 97.13F  Pulse: 81 (Regular)  P.OX: 91% (Room air) BP: 110/82 (Sitting, Left Arm, Standard)      Physical Exam Leighton Ruff MD; 11/91/4782 1:05 PM)  General Mental Status-Alert. General Appearance-Not in acute distress. Build & Nutrition-Well nourished. Posture-Normal posture. Gait-Normal.  Head and Neck Head-normocephalic, atraumatic with no lesions or palpable masses. Trachea-midline.  Chest and Lung Exam Chest and lung exam reveals -on auscultation, normal breath sounds, no adventitious sounds and normal vocal resonance.  Cardiovascular Cardiovascular examination reveals -normal heart sounds, regular rate and rhythm with no murmurs and no digital clubbing, cyanosis, edema, increased warmth or tenderness.  Abdomen Inspection Inspection of the abdomen reveals - No Hernias. Palpation/Percussion Palpation and  Percussion of the abdomen reveal - Soft, Non Tender, No Rigidity (guarding), No hepatosplenomegaly and No Palpable abdominal masses.  Neurologic Neurologic evaluation reveals -alert and oriented x 3 with no impairment of recent or remote memory, normal attention span and ability to concentrate, normal sensation and normal coordination.  Musculoskeletal Normal Exam - Bilateral-Upper Extremity Strength Normal and Lower Extremity Strength Normal.    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 I think a lot of her symptoms are stemming from her decreased rectal tone. I think the next logical step would be to try a sacral nerve stimulator and see if her symptoms reduce. We will have her complete a bowel diary over the next 2 weeks to confirm her symptoms. If SNS does not help, we can discuss resecting her mucosal prolapse versus diverting colostomy.

## 2017-03-23 NOTE — H&P (View-Only) (Signed)
History of Present Illness Leighton Ruff MD; 37/62/8315 1:04 PM) The patient is a 66 year old female who presents with a complaint of anal problems. 66 year old female who presents to the office with complaints of mucosal drainage via rectum. She also has occasional fecal incontinence approximately once a week or more. She has a h/o IBS-D and takes a fiber supplement for this on a daily basis. This causes her to have regular bowel habits and formed stool but she continues to have leakage. She has also tried taking Imodium to bulk up her stool but this does not decrease her mucus drainage. She underwent an abdominal rectopexy with Dr. Ezequiel Ganser [facility] in 2011. She complains of prolapsing tissue with bowel movements. Recently she has tried a probiotic and a peppermint extract which she feels has decrease the mucus drainage in her rectum. She continues to have fecal incontinence though.   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 Hip Surgery Bilateral. Oral Surgery Spinal Surgery - Lower Back Spinal Surgery - Neck  Diagnostic Studies History Leighton Ruff, MD; 85/46/2703 1:04 PM) Colonoscopy 1-5 years ago Mammogram within last year Pap Smear 1-5 years ago  Allergies Leighton Ruff, MD; 50/12/3816 1:04 PM) Amoxicillin-Pot Clavulanate *CHEMICALS* Augmentin *PENICILLINS* BuPROPion HCl *CHEMICALS* CeleBREX *ANALGESICS - ANTI-INFLAMMATORY* METHADONE MORPHINE NAPROXEN Wellbutrin *ANTIDEPRESSANTS*  Medication History (Armen Glenn, CMA; 03/23/2017 11:39 AM) Cephalexin (250MG  Capsule, Oral) Active. ClonazePAM (1MG  Tablet, Oral) Active. Atenolol (25MG  Tablet, Oral) Active. Atorvastatin Calcium (10MG  Tablet, Oral) Active. Aspirin (81MG  Tablet, Oral) Active. Baclofen (20MG  Tablet, Oral) Active. Fish  Oil (1200MG  Capsule, Oral) Active. Multivitamin Adult (Oral) Active. Verapamil HCl ER (240MG  Tablet ER, Oral) Active. Dicyclomine HCl (20MG  Tablet, Oral) Active. Phenazopyridine HCl (200MG  Tablet, Oral) Active. Urecholine (25MG  Tablet, Oral) Active. Medications Reconciled  Social History Leighton Ruff, MD; 29/93/7169 1:04 PM) Alcohol use Occasional alcohol use. Caffeine use Tea. No drug use Tobacco use Current some day smoker.  Family History Leighton Ruff, MD; 67/89/3810 1:04 PM) Colon Polyps Mother. Diabetes Mellitus Mother. Heart Disease Mother. Heart disease in female family member before age 8 Hypertension Mother. Kidney Disease Mother. Respiratory Condition Father.  Pregnancy / Birth History Leighton Ruff, MD; 17/51/0258 1:04 PM) Age at menarche 69 years. Age of menopause 58-50 Contraceptive History Oral contraceptives. Gravida 0 Para 0  Other Problems Leighton Ruff, MD; 52/77/8242 1:04 PM) Back Pain Heart murmur     Review of Systems Leighton Ruff MD; 35/36/1443 1:05 PM) General Present- Appetite Loss. Not Present- Chills, Fatigue, Fever, Night Sweats, Weight Gain and Weight Loss. Skin Not Present- Change in Wart/Mole, Dryness, Hives, Jaundice, New Lesions, Non-Healing Wounds, Rash and Ulcer. HEENT Present- Wears glasses/contact lenses. Not Present- Earache, Hearing Loss, Hoarseness, Nose Bleed, Oral Ulcers, Ringing in the Ears, Seasonal Allergies, Sinus Pain, Sore Throat, Visual Disturbances and Yellow Eyes. Respiratory Not Present- Bloody sputum, Chronic Cough, Difficulty Breathing, Snoring and Wheezing. Breast Not Present- Breast Mass, Breast Pain, Nipple Discharge and Skin Changes. Cardiovascular Not Present- Chest Pain, Difficulty Breathing Lying Down, Leg Cramps, Palpitations, Rapid Heart Rate, Shortness of Breath and Swelling of Extremities. Gastrointestinal Present- Change in Bowel Habits and Rectal Pain. Not Present- Abdominal  Pain, Bloating, Bloody Stool, Chronic diarrhea, Constipation, Difficulty Swallowing, Excessive gas, Gets full quickly at meals, Hemorrhoids, Indigestion, Nausea and Vomiting. Female Genitourinary Present- Frequency. Not Present- Nocturia, Painful Urination, Pelvic Pain and Urgency. Musculoskeletal Not Present- Back Pain, Joint Pain,  Joint Stiffness, Muscle Pain, Muscle Weakness and Swelling of Extremities. Neurological Not Present- Decreased Memory, Fainting, Headaches, Numbness, Seizures, Tingling, Tremor, Trouble walking and Weakness. Psychiatric Not Present- Anxiety, Bipolar, Change in Sleep Pattern, Depression, Fearful and Frequent crying. Endocrine Not Present- Cold Intolerance, Excessive Hunger, Hair Changes, Heat Intolerance, Hot flashes and New Diabetes. Hematology Not Present- Blood Thinners, Easy Bruising, Excessive bleeding, Gland problems, HIV and Persistent Infections.  Vitals (Armen Glenn CMA; 03/23/2017 11:37 AM) 03/23/2017 11:37 AM Weight: 112 lb Height: 68in Body Surface Area: 1.6 m Body Mass Index: 17.03 kg/m  Temp.: 97.82F  Pulse: 81 (Regular)  P.OX: 91% (Room air) BP: 110/82 (Sitting, Left Arm, Standard)      Physical Exam Leighton Ruff MD; 16/01/9603 1:05 PM)  General Mental Status-Alert. General Appearance-Not in acute distress. Build & Nutrition-Well nourished. Posture-Normal posture. Gait-Normal.  Head and Neck Head-normocephalic, atraumatic with no lesions or palpable masses. Trachea-midline.  Chest and Lung Exam Chest and lung exam reveals -on auscultation, normal breath sounds, no adventitious sounds and normal vocal resonance.  Cardiovascular Cardiovascular examination reveals -normal heart sounds, regular rate and rhythm with no murmurs and no digital clubbing, cyanosis, edema, increased warmth or tenderness.  Abdomen Inspection Inspection of the abdomen reveals - No Hernias. Palpation/Percussion Palpation and  Percussion of the abdomen reveal - Soft, Non Tender, No Rigidity (guarding), No hepatosplenomegaly and No Palpable abdominal masses.  Neurologic Neurologic evaluation reveals -alert and oriented x 3 with no impairment of recent or remote memory, normal attention span and ability to concentrate, normal sensation and normal coordination.  Musculoskeletal Normal Exam - Bilateral-Upper Extremity Strength Normal and Lower Extremity Strength Normal.    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 I think a lot of her symptoms are stemming from her decreased rectal tone. I think the next logical step would be to try a sacral nerve stimulator and see if her symptoms reduce. We will have her complete a bowel diary over the next 2 weeks to confirm her symptoms. If SNS does not help, we can discuss resecting her mucosal prolapse versus diverting colostomy.

## 2017-03-23 NOTE — H&P (Signed)
History of Present Illness Leighton Ruff MD; 34/74/2595 1:04 PM) The patient is a 66 year old female who presents with a complaint of anal problems. 66 year old female who presents to the office with complaints of mucosal drainage via rectum. She also has occasional fecal incontinence approximately once a week or more. She has a h/o IBS-D and takes a fiber supplement for this on a daily basis. This causes her to have regular bowel habits and formed stool but she continues to have leakage. She has also tried taking Imodium to bulk up her stool but this does not decrease her mucus drainage. She underwent an abdominal rectopexy with Dr. Ezequiel [facility] in 2011. She complains of prolapsing tissue with bowel movements. Recently she has tried a probiotic and a peppermint extract which she feels has decrease the mucus drainage in her rectum. She continues to have fecal incontinence though.   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 Hip Surgery Bilateral. Oral Surgery Spinal Surgery - Lower Back Spinal Surgery - Neck  Diagnostic Studies History Leighton Ruff, MD; 16/60/6301 1:04 PM) Colonoscopy 1-5 years ago Mammogram within last year Pap Smear 1-5 years ago  Allergies Leighton Ruff, MD; 60/01/9322 1:04 PM) Amoxicillin-Pot Clavulanate *CHEMICALS* Augmentin *PENICILLINS* BuPROPion HCl *CHEMICALS* CeleBREX *ANALGESICS - ANTI-INFLAMMATORY* METHADONE MORPHINE NAPROXEN Wellbutrin *ANTIDEPRESSANTS*  Medication History (Armen Glenn, CMA; 03/23/2017 11:39 AM) Cephalexin (250MG  Capsule, Oral) Active. ClonazePAM (1MG  Tablet, Oral) Active. Atenolol (25MG  Tablet, Oral) Active. Atorvastatin Calcium (10MG  Tablet, Oral) Active. Aspirin (81MG  Tablet, Oral) Active. Baclofen (20MG  Tablet, Oral) Active. Fish  Oil (1200MG  Capsule, Oral) Active. Multivitamin Adult (Oral) Active. Verapamil HCl ER (240MG  Tablet ER, Oral) Active. Dicyclomine HCl (20MG  Tablet, Oral) Active. Phenazopyridine HCl (200MG  Tablet, Oral) Active. Urecholine (25MG  Tablet, Oral) Active. Medications Reconciled  Social History Leighton Ruff, MD; 55/73/2202 1:04 PM) Alcohol use Occasional alcohol use. Caffeine use Tea. No drug use Tobacco use Current some day smoker.  Family History Leighton Ruff, MD; 54/27/0623 1:04 PM) Colon Polyps Mother. Diabetes Mellitus Mother. Heart Disease Mother. Heart disease in female family member before age 87 Hypertension Mother. Kidney Disease Mother. Respiratory Condition Father.  Pregnancy / Birth History Leighton Ruff, MD; 76/28/3151 1:04 PM) Age at menarche 87 years. Age of menopause 41-50 Contraceptive History Oral contraceptives. Gravida 0 Para 0  Other Problems Leighton Ruff, MD; 76/16/0737 1:04 PM) Back Pain Heart murmur     Review of Systems Leighton Ruff MD; 10/62/6948 1:05 PM) General Present- Appetite Loss. Not Present- Chills, Fatigue, Fever, Night Sweats, Weight Gain and Weight Loss. Skin Not Present- Change in Wart/Mole, Dryness, Hives, Jaundice, New Lesions, Non-Healing Wounds, Rash and Ulcer. HEENT Present- Wears glasses/contact lenses. Not Present- Earache, Hearing Loss, Hoarseness, Nose Bleed, Oral Ulcers, Ringing in the Ears, Seasonal Allergies, Sinus Pain, Sore Throat, Visual Disturbances and Yellow Eyes. Respiratory Not Present- Bloody sputum, Chronic Cough, Difficulty Breathing, Snoring and Wheezing. Breast Not Present- Breast Mass, Breast Pain, Nipple Discharge and Skin Changes. Cardiovascular Not Present- Chest Pain, Difficulty Breathing Lying Down, Leg Cramps, Palpitations, Rapid Heart Rate, Shortness of Breath and Swelling of Extremities. Gastrointestinal Present- Change in Bowel Habits and Rectal Pain. Not Present- Abdominal  Pain, Bloating, Bloody Stool, Chronic diarrhea, Constipation, Difficulty Swallowing, Excessive gas, Gets full quickly at meals, Hemorrhoids, Indigestion, Nausea and Vomiting. Female Genitourinary Present- Frequency. Not Present- Nocturia, Painful Urination, Pelvic Pain and Urgency. Musculoskeletal Not Present- Back Pain, Joint Pain,  Joint Stiffness, Muscle Pain, Muscle Weakness and Swelling of Extremities. Neurological Not Present- Decreased Memory, Fainting, Headaches, Numbness, Seizures, Tingling, Tremor, Trouble walking and Weakness. Psychiatric Not Present- Anxiety, Bipolar, Change in Sleep Pattern, Depression, Fearful and Frequent crying. Endocrine Not Present- Cold Intolerance, Excessive Hunger, Hair Changes, Heat Intolerance, Hot flashes and New Diabetes. Hematology Not Present- Blood Thinners, Easy Bruising, Excessive bleeding, Gland problems, HIV and Persistent Infections.  Vitals (Armen Glenn CMA; 03/23/2017 11:37 AM) 03/23/2017 11:37 AM Weight: 112 lb Height: 68in Body Surface Area: 1.6 m Body Mass Index: 17.03 kg/m  Temp.: 97.48F  Pulse: 81 (Regular)  P.OX: 91% (Room air) BP: 110/82 (Sitting, Left Arm, Standard)      Physical Exam Leighton Ruff MD; 72/62/0355 1:05 PM)  General Mental Status-Alert. General Appearance-Not in acute distress. Build & Nutrition-Well nourished. Posture-Normal posture. Gait-Normal.  Head and Neck Head-normocephalic, atraumatic with no lesions or palpable masses. Trachea-midline.  Chest and Lung Exam Chest and lung exam reveals -on auscultation, normal breath sounds, no adventitious sounds and normal vocal resonance.  Cardiovascular Cardiovascular examination reveals -normal heart sounds, regular rate and rhythm with no murmurs and no digital clubbing, cyanosis, edema, increased warmth or tenderness.  Abdomen Inspection Inspection of the abdomen reveals - No Hernias. Palpation/Percussion Palpation and  Percussion of the abdomen reveal - Soft, Non Tender, No Rigidity (guarding), No hepatosplenomegaly and No Palpable abdominal masses.  Neurologic Neurologic evaluation reveals -alert and oriented x 3 with no impairment of recent or remote memory, normal attention span and ability to concentrate, normal sensation and normal coordination.  Musculoskeletal Normal Exam - Bilateral-Upper Extremity Strength Normal and Lower Extremity Strength Normal.    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 I think a lot of her symptoms are stemming from her decreased rectal tone. I think the next logical step would be to try a sacral nerve stimulator and see if her symptoms reduce. We will have her complete a bowel diary over the next 2 weeks to confirm her symptoms. If SNS does not help, we can discuss resecting her mucosal prolapse versus diverting colostomy.

## 2017-03-29 DIAGNOSIS — G4700 Insomnia, unspecified: Secondary | ICD-10-CM | POA: Diagnosis not present

## 2017-03-29 DIAGNOSIS — M47816 Spondylosis without myelopathy or radiculopathy, lumbar region: Secondary | ICD-10-CM | POA: Diagnosis not present

## 2017-03-29 DIAGNOSIS — G894 Chronic pain syndrome: Secondary | ICD-10-CM | POA: Diagnosis not present

## 2017-03-29 DIAGNOSIS — M961 Postlaminectomy syndrome, not elsewhere classified: Secondary | ICD-10-CM | POA: Diagnosis not present

## 2017-04-06 ENCOUNTER — Other Ambulatory Visit: Payer: Self-pay

## 2017-04-06 ENCOUNTER — Encounter (HOSPITAL_BASED_OUTPATIENT_CLINIC_OR_DEPARTMENT_OTHER): Payer: Self-pay

## 2017-04-06 DIAGNOSIS — R3982 Chronic bladder pain: Secondary | ICD-10-CM | POA: Diagnosis not present

## 2017-04-06 DIAGNOSIS — N390 Urinary tract infection, site not specified: Secondary | ICD-10-CM | POA: Diagnosis not present

## 2017-04-06 DIAGNOSIS — R339 Retention of urine, unspecified: Secondary | ICD-10-CM | POA: Diagnosis not present

## 2017-04-06 NOTE — Progress Notes (Signed)
Spoke with [name], no food and smoking after midnight 04/08/17. Clear liquids until 8am 04/09/17.  Arrival time 12:30PM DOS.  Will need ISTAT8. EKG is printed and in chart.  She will take Atenolol AM DOS with sip of water.  Pre op orders are in epic.  Kayleen Memos (husband) [phone] will accompany [name] and be her ride home from surgery.

## 2017-04-09 ENCOUNTER — Encounter (HOSPITAL_BASED_OUTPATIENT_CLINIC_OR_DEPARTMENT_OTHER): Payer: Self-pay

## 2017-04-09 ENCOUNTER — Ambulatory Visit (HOSPITAL_BASED_OUTPATIENT_CLINIC_OR_DEPARTMENT_OTHER): Payer: Medicare Other | Admitting: Anesthesiology

## 2017-04-09 ENCOUNTER — Ambulatory Visit (HOSPITAL_BASED_OUTPATIENT_CLINIC_OR_DEPARTMENT_OTHER)
Admission: RE | Admit: 2017-04-09 | Discharge: 2017-04-09 | Disposition: A | Discharge status: Home / Self Care | Payer: Medicare Other | Source: Ambulatory Visit | Attending: General Surgery | Admitting: General Surgery

## 2017-04-09 ENCOUNTER — Ambulatory Visit (HOSPITAL_COMMUNITY): Payer: Medicare Other

## 2017-04-09 ENCOUNTER — Encounter (HOSPITAL_BASED_OUTPATIENT_CLINIC_OR_DEPARTMENT_OTHER)
Admission: RE | Disposition: A | Discharge status: Home / Self Care | Payer: Self-pay | Source: Ambulatory Visit | Attending: General Surgery

## 2017-04-09 DIAGNOSIS — F17210 Nicotine dependence, cigarettes, uncomplicated: Secondary | ICD-10-CM | POA: Diagnosis not present

## 2017-04-09 DIAGNOSIS — Z79899 Other long term (current) drug therapy: Secondary | ICD-10-CM | POA: Diagnosis not present

## 2017-04-09 DIAGNOSIS — Z833 Family history of diabetes mellitus: Secondary | ICD-10-CM | POA: Insufficient documentation

## 2017-04-09 DIAGNOSIS — Z7982 Long term (current) use of aspirin: Secondary | ICD-10-CM | POA: Diagnosis not present

## 2017-04-09 DIAGNOSIS — F418 Other specified anxiety disorders: Secondary | ICD-10-CM | POA: Diagnosis not present

## 2017-04-09 DIAGNOSIS — Z888 Allergy status to other drugs, medicaments and biological substances status: Secondary | ICD-10-CM | POA: Diagnosis not present

## 2017-04-09 DIAGNOSIS — R159 Full incontinence of feces: Secondary | ICD-10-CM | POA: Insufficient documentation

## 2017-04-09 DIAGNOSIS — Z881 Allergy status to other antibiotic agents status: Secondary | ICD-10-CM | POA: Insufficient documentation

## 2017-04-09 DIAGNOSIS — Z9889 Other specified postprocedural states: Secondary | ICD-10-CM | POA: Insufficient documentation

## 2017-04-09 DIAGNOSIS — M1990 Unspecified osteoarthritis, unspecified site: Secondary | ICD-10-CM | POA: Diagnosis not present

## 2017-04-09 DIAGNOSIS — Z8249 Family history of ischemic heart disease and other diseases of the circulatory system: Secondary | ICD-10-CM | POA: Diagnosis not present

## 2017-04-09 DIAGNOSIS — Z885 Allergy status to narcotic agent status: Secondary | ICD-10-CM | POA: Insufficient documentation

## 2017-04-09 DIAGNOSIS — Z886 Allergy status to analgesic agent status: Secondary | ICD-10-CM | POA: Insufficient documentation

## 2017-04-09 DIAGNOSIS — Z836 Family history of other diseases of the respiratory system: Secondary | ICD-10-CM | POA: Insufficient documentation

## 2017-04-09 DIAGNOSIS — F419 Anxiety disorder, unspecified: Secondary | ICD-10-CM | POA: Diagnosis not present

## 2017-04-09 DIAGNOSIS — Z841 Family history of disorders of kidney and ureter: Secondary | ICD-10-CM | POA: Diagnosis not present

## 2017-04-09 DIAGNOSIS — Z8371 Family history of colonic polyps: Secondary | ICD-10-CM | POA: Diagnosis not present

## 2017-04-09 DIAGNOSIS — I471 Supraventricular tachycardia: Secondary | ICD-10-CM | POA: Diagnosis not present

## 2017-04-09 DIAGNOSIS — Z4682 Encounter for fitting and adjustment of non-vascular catheter: Secondary | ICD-10-CM | POA: Diagnosis not present

## 2017-04-09 DIAGNOSIS — I472 Ventricular tachycardia: Secondary | ICD-10-CM | POA: Diagnosis not present

## 2017-04-09 DIAGNOSIS — Z419 Encounter for procedure for purposes other than remedying health state, unspecified: Secondary | ICD-10-CM

## 2017-04-09 DIAGNOSIS — J449 Chronic obstructive pulmonary disease, unspecified: Secondary | ICD-10-CM | POA: Diagnosis not present

## 2017-04-09 HISTORY — DX: Full incontinence of feces: R15.9

## 2017-04-09 HISTORY — DX: Nonrheumatic mitral (valve) prolapse: I34.1

## 2017-04-09 HISTORY — DX: Hyperlipidemia, unspecified: E78.5

## 2017-04-09 SURGERY — INSERTION, NEUROSTIMULATOR, SACRAL
Anesthesia: Monitor Anesthesia Care | Site: Back

## 2017-04-09 MED ORDER — PROPOFOL 500 MG/50ML IV EMUL
INTRAVENOUS | Status: DC | PRN
  Administered 2017-04-09: 200 ug/kg/min via INTRAVENOUS

## 2017-04-09 MED ORDER — DEXAMETHASONE SODIUM PHOSPHATE 10 MG/ML IJ SOLN
INTRAMUSCULAR | Status: DC | PRN
  Administered 2017-04-09: 5 mg via INTRAVENOUS

## 2017-04-09 MED ORDER — MIDAZOLAM HCL 2 MG/2ML IJ SOLN
0.5000 mg | Freq: Once | INTRAMUSCULAR | Status: DC | PRN
  Filled 2017-04-09: qty 2

## 2017-04-09 MED ORDER — HYDROMORPHONE HCL 1 MG/ML IJ SOLN
0.2500 mg | INTRAMUSCULAR | Status: DC | PRN
  Filled 2017-04-09: qty 0.5

## 2017-04-09 MED ORDER — DEXAMETHASONE SODIUM PHOSPHATE 10 MG/ML IJ SOLN
INTRAMUSCULAR | Status: AC
  Filled 2017-04-09: qty 1

## 2017-04-09 MED ORDER — CEFAZOLIN SODIUM-DEXTROSE 2-4 GM/100ML-% IV SOLN
2.0000 g | INTRAVENOUS | Status: AC
  Administered 2017-04-09: 2 g via INTRAVENOUS
  Filled 2017-04-09: qty 100

## 2017-04-09 MED ORDER — BUPIVACAINE-EPINEPHRINE 0.5% -1:200000 IJ SOLN
INTRAMUSCULAR | Status: DC | PRN
  Administered 2017-04-09: 25 mL

## 2017-04-09 MED ORDER — KETAMINE HCL 10 MG/ML IJ SOLN
INTRAMUSCULAR | Status: DC | PRN
  Administered 2017-04-09: 10 mg via INTRAVENOUS
  Administered 2017-04-09: 20 mg via INTRAVENOUS

## 2017-04-09 MED ORDER — MEPERIDINE HCL 25 MG/ML IJ SOLN
6.2500 mg | INTRAMUSCULAR | Status: DC | PRN
  Filled 2017-04-09: qty 1

## 2017-04-09 MED ORDER — KETAMINE HCL-SODIUM CHLORIDE 100-0.9 MG/10ML-% IV SOSY
PREFILLED_SYRINGE | INTRAVENOUS | Status: AC
  Filled 2017-04-09: qty 10

## 2017-04-09 MED ORDER — GABAPENTIN 300 MG PO CAPS
ORAL_CAPSULE | ORAL | Status: AC
  Filled 2017-04-09: qty 1

## 2017-04-09 MED ORDER — ACETAMINOPHEN 500 MG PO TABS
ORAL_TABLET | ORAL | Status: AC
  Filled 2017-04-09: qty 2

## 2017-04-09 MED ORDER — PROMETHAZINE HCL 25 MG/ML IJ SOLN
6.2500 mg | INTRAMUSCULAR | Status: DC | PRN
  Filled 2017-04-09: qty 1

## 2017-04-09 MED ORDER — MIDAZOLAM HCL 2 MG/2ML IJ SOLN
INTRAMUSCULAR | Status: AC
  Filled 2017-04-09: qty 2

## 2017-04-09 MED ORDER — ONDANSETRON HCL 4 MG/2ML IJ SOLN
INTRAMUSCULAR | Status: DC | PRN
  Administered 2017-04-09: 4 mg via INTRAVENOUS

## 2017-04-09 MED ORDER — SODIUM CHLORIDE 0.9 % IV SOLN
INTRAVENOUS | Status: DC | PRN
  Administered 2017-04-09: 20 ug/kg/min via INTRAVENOUS

## 2017-04-09 MED ORDER — CEFAZOLIN SODIUM-DEXTROSE 2-4 GM/100ML-% IV SOLN
INTRAVENOUS | Status: AC
  Filled 2017-04-09: qty 100

## 2017-04-09 MED ORDER — LIDOCAINE 2% (20 MG/ML) 5 ML SYRINGE
INTRAMUSCULAR | Status: DC | PRN
  Administered 2017-04-09: 50 mg via INTRAVENOUS

## 2017-04-09 MED ORDER — ONDANSETRON HCL 4 MG/2ML IJ SOLN
INTRAMUSCULAR | Status: AC
  Filled 2017-04-09: qty 2

## 2017-04-09 MED ORDER — LACTATED RINGERS IV SOLN
INTRAVENOUS | Status: DC
  Administered 2017-04-09: 13:00:00 via INTRAVENOUS
  Administered 2017-04-09: 1000 mL via INTRAVENOUS
  Filled 2017-04-09: qty 1000

## 2017-04-09 MED ORDER — ACETAMINOPHEN 500 MG PO TABS
1000.0000 mg | ORAL_TABLET | ORAL | Status: AC
Start: 2017-04-10 — End: 2017-04-09
  Administered 2017-04-09: 1000 mg via ORAL
  Filled 2017-04-09: qty 2

## 2017-04-09 MED ORDER — GABAPENTIN 300 MG PO CAPS
300.0000 mg | ORAL_CAPSULE | ORAL | Status: AC
  Administered 2017-04-09: 300 mg via ORAL
  Filled 2017-04-09: qty 1

## 2017-04-09 MED ORDER — MIDAZOLAM HCL 2 MG/2ML IJ SOLN
INTRAMUSCULAR | Status: DC | PRN
  Administered 2017-04-09: 2 mg via INTRAVENOUS

## 2017-04-09 MED ORDER — TRAMADOL HCL 50 MG PO TABS: 50.0000 mg | ORAL_TABLET | Freq: Four times a day (QID) | ORAL | 0 refills | Status: DC | PRN

## 2017-04-09 MED ORDER — PROPOFOL 500 MG/50ML IV EMUL
INTRAVENOUS | Status: AC
  Filled 2017-04-09: qty 50

## 2017-04-09 SURGICAL SUPPLY — 53 items
BAG URINE LEG 500ML (DRAIN) IMPLANT
BLADE HEX COATED 2.75 (ELECTRODE) ×2 IMPLANT
BLADE SURG 15 STRL LF DISP TIS (BLADE) ×1 IMPLANT
BLADE SURG 15 STRL SS (BLADE) ×1
CABLE TEST STIMULATION (UROLOGICAL SUPPLIES) ×2 IMPLANT
CABLE TWIST LOCK 25CM (UROLOGICAL SUPPLIES) ×2 IMPLANT
CHLORAPREP W/TINT 26ML (MISCELLANEOUS) ×2 IMPLANT
COVER BACK TABLE 60X90IN (DRAPES) ×2 IMPLANT
COVER MAYO STAND STRL (DRAPES) ×2 IMPLANT
COVER PROBE W GEL 5X96 (DRAPES) IMPLANT
DERMABOND ADVANCED (GAUZE/BANDAGES/DRESSINGS) ×1
DERMABOND ADVANCED .7 DNX12 (GAUZE/BANDAGES/DRESSINGS) ×1 IMPLANT
DRAPE C-ARM 42X72 X-RAY (DRAPES) ×2 IMPLANT
DRAPE INCISE IOBAN 66X45 STRL (DRAPES) ×2 IMPLANT
DRAPE LAPAROSCOPIC ABDOMINAL (DRAPES) ×2 IMPLANT
DRAPE LG THREE QUARTER DISP (DRAPES) IMPLANT
DRAPE UTILITY XL STRL (DRAPES) ×2 IMPLANT
DRSG TEGADERM 4X4.75 (GAUZE/BANDAGES/DRESSINGS) ×2 IMPLANT
DRSG TELFA 3X8 NADH (GAUZE/BANDAGES/DRESSINGS) ×2 IMPLANT
ELECT REM PT RETURN 9FT ADLT (ELECTROSURGICAL) ×2
ELECTRODE REM PT RTRN 9FT ADLT (ELECTROSURGICAL) ×1 IMPLANT
GLOVE BIO SURGEON STRL SZ 6.5 (GLOVE) ×2 IMPLANT
GLOVE BIOGEL PI IND STRL 7.5 (GLOVE) ×3 IMPLANT
GLOVE BIOGEL PI INDICATOR 7.5 (GLOVE) ×3
GLOVE INDICATOR 7.0 STRL GRN (GLOVE) ×2 IMPLANT
GOWN SPEC L3 XXLG W/TWL (GOWN DISPOSABLE) ×2 IMPLANT
GOWN STRL REUS W/TWL XL LVL3 (GOWN DISPOSABLE) ×2 IMPLANT
INTRODUCER GUIDE DILATR SHEATH (SET/KITS/TRAYS/PACK) ×2 IMPLANT
KIT INTERSTIM LEAD TINED 28CM (Urological Implant) ×2 IMPLANT
KIT RM TURNOVER CYSTO AR (KITS) ×2 IMPLANT
MANIFOLD NEPTUNE II (INSTRUMENTS) IMPLANT
MEDTRONIC EXTERNAL NEUROSTIMULATOR ×2 IMPLANT
NEEDLE FORAMEN 20GA 3.5  9CM (NEEDLE) IMPLANT
NEEDLE FORAMEN 20GA 5  12.5CM (NEEDLE) IMPLANT
NEEDLE HYPO 22GX1.5 SAFETY (NEEDLE) ×2 IMPLANT
NEUROSTIMULATOR 1.7X2X.06 (UROLOGICAL SUPPLIES) ×2 IMPLANT
PACK BASIN DAY SURGERY FS (CUSTOM PROCEDURE TRAY) ×2 IMPLANT
PAD ARMBOARD 7.5X6 YLW CONV (MISCELLANEOUS) ×4 IMPLANT
PENCIL BUTTON HOLSTER BLD 10FT (ELECTRODE) ×2 IMPLANT
PROGRAMMER ANTENNA EXT (UROLOGICAL SUPPLIES) IMPLANT
PROGRAMMER STIMUL 2.2X1.1X3.7 (UROLOGICAL SUPPLIES) IMPLANT
SPONGE GAUZE 4X4 12PLY STER LF (GAUZE/BANDAGES/DRESSINGS) IMPLANT
STRIP CLOSURE SKIN 1/2X4 (GAUZE/BANDAGES/DRESSINGS) ×2 IMPLANT
SUT SILK 2 0 TIES 17X18 (SUTURE)
SUT SILK 2-0 18XBRD TIE BLK (SUTURE) IMPLANT
SUT VIC AB 3-0 SH 27 (SUTURE) ×1
SUT VIC AB 3-0 SH 27X BRD (SUTURE) ×1 IMPLANT
SUT VICRYL 4-0 PS2 18IN ABS (SUTURE) ×2 IMPLANT
SYR BULB IRRIGATION 50ML (SYRINGE) IMPLANT
SYR CONTROL 10ML LL (SYRINGE) ×2 IMPLANT
TOWEL OR 17X24 6PK STRL BLUE (TOWEL DISPOSABLE) ×4 IMPLANT
TUBE CONNECTING 12X1/4 (SUCTIONS) IMPLANT
WATER STERILE IRR 500ML POUR (IV SOLUTION) ×2 IMPLANT

## 2017-04-09 NOTE — Progress Notes (Signed)
Discussed orded obtained from Dr. Harlow Asa to put in foley and leave in over weekend and follow up in office on Monday.  Patient refused foley stating I am not going home with foley. I want this stimulator turned off.

## 2017-04-09 NOTE — Progress Notes (Signed)
Return of 200 cc clear yellow urine with foley catheter insertion of 16 Fr foley catheter.  Patient tolerated it well. Instructed on how to change from standard bag to leg bag and voice understanding with teach back/demonstration. Foley secured with leg strap.

## 2017-04-09 NOTE — Discharge Instructions (Addendum)
POST OP INSTRUCTIONS  Always review your discharge instruction sheet given to you by the facility where your surgery was performed.   1. A prescription for pain medication may be given to you upon discharge. Take your pain medication as prescribed, if needed. If narcotic pain medicine is not needed, then you make take acetaminophen (Tylenol) or ibuprofen (Advil) as needed.  2. Take your usually prescribed medications unless otherwise directed. 3. If you need a refill on your pain medication, please contact our office. All narcotic pain medicine now requires a paper prescription.  Phoned in and fax refills are no longer allowed by law.  Prescriptions will not be filled after 5 pm or on weekends.  4. You should follow a light diet for the remainder of the day after your procedure. 5. Most patients will experience some mild swelling and/or bruising in the area of the incision. It may take several days to resolve. 6. It is common to experience some constipation if taking pain medication after surgery. Increasing fluid intake and taking a stool softener (such as Colace) will usually help or prevent this problem from occurring. A mild laxative (Milk of Magnesia or Miralax) should be taken according to package directions if there are no bowel movements after 48 hours.  7. Your battery pack cannot get wet.  You will need to do sponge baths until your next procedure.  Keep skin around your incisions clean and dry.  8. ACTIVITIES:  Limit activity involving your arms for the next 72 hours. Do no strenuous exercise or activity for 1 week. You may drive when you are no longer taking prescription pain medication, you can comfortably wear a seatbelt, and you can maneuver your car. 9.You may need to see your doctor in the office for a follow-up appointment.  Please check with your doctor.    WHEN TO CALL YOUR DOCTOR [phone]): 1. Fever over 101.0 2. Chills 3. Continued bleeding from incision 4. Increased  redness and tenderness at the site 5. Shortness of breath, difficulty breathing   The clinic staff is available to answer your questions during regular business hours. Please dont hesitate to call and ask to speak to one of the nurses or medical assistants for clinical concerns. If you have a medical emergency, go to the nearest emergency room or call 911.  A surgeon from [facility] is always on call at the hospital.   NO showering or bathing.  Sponge baths only.  No bending, lifting, or twisting. Keep the bowel diary.  Modesta Messing with [employer] will contact you on Monday.    For further information, please visit [email or web address]     Post Anesthesia Home Care Instructions  Activity: Get plenty of rest for the remainder of the day. A responsible individual must stay with you for 24 hours following the procedure.  For the next 24 hours, DO NOT: -Drive a car -Paediatric nurse -Drink alcoholic beverages -Take any medication unless instructed by your physician -Make any legal decisions or sign important papers.  Meals: Start with liquid foods such as gelatin or soup. Progress to regular foods as tolerated. Avoid greasy, spicy, heavy foods. If nausea and/or vomiting occur, drink only clear liquids until the nausea and/or vomiting subsides. Call your physician if vomiting continues.  Special Instructions/Symptoms: Your throat may feel dry or sore from the anesthesia or the breathing tube placed in your throat during surgery. If this causes discomfort, gargle with warm salt water. The discomfort should disappear within 24 hours.  If you had a scopolamine patch placed behind your ear for the management of post- operative nausea and/or vomiting:  1. The medication in the patch is effective for 72 hours, after which it should be removed.  Wrap patch in a tissue and discard in the trash. Wash hands thoroughly with soap and water. 2. You may remove the  patch earlier than 72 hours if you experience unpleasant side effects which may include dry mouth, dizziness or visual disturbances. 3. Avoid touching the patch. Wash your hands with soap and water after contact with the patch.

## 2017-04-09 NOTE — Op Note (Signed)
04/09/2017  4:10 PM  PATIENT:  [name]  66 y.o. female  Patient Care Team: Raina Mina., MD as PCP - General (Internal Medicine) Renette Butters, MD as Attending Physician (Orthopedic Surgery)  PRE-OPERATIVE DIAGNOSIS:  fecal incontinence  POST-OPERATIVE DIAGNOSIS:  fecal incontinence  PROCEDURE:   SACRAL NERVE STIMULATOR WIRE IMPLANTATION STAGE ONE   Surgeon(s): Leighton Ruff, MD  ASSISTANT: none   ANESTHESIA:   local and MAC  EBL:  Total I/O In: 500 [I.V.:500] Out: -   DRAINS: none   SPECIMEN:  No Specimen  DISPOSITION OF SPECIMEN:  N/A  COUNTS:  YES  PLAN OF CARE: Discharge to home after PACU  PATIENT DISPOSITION:  PACU - hemodynamically stable.  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  The patient suffers from fecal incontinence ~ 2 times per day and almost constant mucus drainage.  she has tried daily fiber supplements, imodium and probiotics to manage this without success.  Anal manometry shows abnormally low resting tone.  The risks and benefits of the surgery were described to the patient and consent was signed and placed on chart prior to the OR.  DESCRIPTION: the patient was identified in the preoperative holding area and taken to the OR where they were laid prone on the operating room table.  MAC anesthesia was induced without difficulty. SCDs were also noted to be in place prior to the initiation of anesthesia.  Pillows were placed under lower abdomen to flatten the sacrum and under shins to allow the toes to dangle freely. A ground pad was placed on the bottom of the patient's foot and the proximal ends of the j-hook patient cable were connected to the ground pad and the external neurostimulator (ENS).The patient was then prepped and draped in the usual sterile fashion.  A surgical timeout was performed indicating the correct patient, procedure, positioning and need for preoperative antibiotics.  The c-arm was moved into AP  position to provide fluoroscopic guidance of the sacrum. The medial edges of the foramina were identified and marked. The c-arm was then moved into the lateral position to identify the S3 foramen. Once the needle entry point was determined, local injection of Marcaine was administered bilaterally. A foramen needle was placed in the superior, medial aspect of the Right S3 foramen and appropriate needle depth was visualized utilizing fluoroscopy. Proper S3 needle location was also confirmed by direct observation of the lifting of the perineum or "bellowing," and plantar flexion of the great toe utilizing the j-hook patient cable, the external neurostimulator and Verify controller. The foramen needle stylet was removed and a directional guide was placed through the needle using markers on the guide to assure appropriate depth. The foramen needle was removed by sliding over the directional guide. A small incision was made peripherally to the directional guide through the skin. The lead introducer with dilator was placed over the directional guide and utilizing fluoroscopic guidance, the lead introducer was advanced until the radiopaque mark was half-way through the foramen. The dilator was removed along with the directional guide. Using fluoroscopy, the tined lead with bent stylet was placed through the introducer until electrodes two and three straddled the anterior surface of the sacrum. All four electrodes were tested, observing "bellows" and plantar flexion of the great toe utilizing the j-hook patient cable and the external neurostimulator with Verify controller. After satisfactory lead positioning was confirmed, the introducer was retracted over the lead under continuous fluoroscopy, deploying the tines into presacral tissue. Retesting of all  four electrodes confirming appropriate responses was completed.   The potential internal neurostimulator pocket site was identified below the iliac crest and lateral to  the sacrum. Local anesthesia was administered and an incision was made into the subcutaneous tissue creating a connection site. Blunt dissection was used to create a small pocket with hemostasis achieved.  A tunneling tool with sheath was placed from the lead exit site subcutaneously to the small incised pocket site. The tunneling tool was removed and the lead was fed through the sheath, exiting at the pocket connection site. The sheath was removed. The lead was cleaned and dried. A protective boot was placed over the lead; the lead was inserted into the temporary percutaneous extension with visual confirmation of blue tip advancement. The four setscrews were tightened with the torque wrench until audible clicks were heard. The boot was slid over the connection and 2-0 silk ties were sutured to the boot grooves on either side of connection. Using the tunneling tool and sheath, a subcutaneous tunnel was created from the pocket site to the contralateral buttock and exited at a localized site. The percutaneous extension was placed through the sheath, the sheath removed, and the extension exiting the site.  The connection components were placed into the incision. The incisions were closed with 2-0 Vicryl subcuticular sutures and 4-0 Vicryl skin sutures. The incisions were covered with Dermabond.  Adhesive strips and gauze were placed over the percutaneous extension exit site and secured with transparent dressing.   The percutaneous extension was attached to the external white twist lock cable. Gauze was placed under the twist-lock connection with cable strain relief and secured using transparent dressing. The twist lock cable was then plugged into the external neuorstimulator. The patient was transferred to PACU in satisfactory condition. Using the Verify controller and external neurostimulator, the patient was programmed to the electrode of optimum sensation and provided utilization instructions prior to  discharge. Patient will complete a voiding diary during testing period to help document results of this test procedure.

## 2017-04-09 NOTE — Anesthesia Postprocedure Evaluation (Signed)
Anesthesia Post Note  Patient: [name]  Procedure(s) Performed: SACRAL NERVE STIMULATOR IMPLANTATION STAGE ONE (N/A Back)     Patient location during evaluation: PACU Anesthesia Type: MAC Level of consciousness: oriented, awake and alert and patient cooperative Pain management: pain level controlled (pain improving) Vital Signs Assessment: post-procedure vital signs reviewed and stable Respiratory status: spontaneous breathing, nonlabored ventilation and respiratory function stable Cardiovascular status: blood pressure returned to baseline and stable Postop Assessment: no apparent nausea or vomiting Anesthetic complications: no    Last Vitals:  Vitals:   04/09/17 1630 04/09/17 1645  BP: (!) 115/51 112/60  Pulse: 71 80  Resp: 16 (!) 21  Temp:    SpO2: 100% 99%    Last Pain:  Vitals:   04/09/17 1311  TempSrc:   PainSc: 4                  Sarea Fyfe,E. Kahlen Boyde

## 2017-04-09 NOTE — Progress Notes (Signed)
Patient decided after relaying information from Dr. Harlow Asa and speaking with the [employer] rep, Modesta Messing, to have foley catheter inserted instead of In and out caths at home. Patient instructed on foley care as well. Foley cath inserted with sterile technique and assisted by Nicola Police, RN after need determined.

## 2017-04-09 NOTE — Anesthesia Procedure Notes (Signed)
Procedure Name: MAC Date/Time: 04/09/2017 3:00 PM Performed by: Wanita Chamberlain, CRNA Pre-anesthesia Checklist: Patient identified, Emergency Drugs available, Suction available and Patient being monitored Patient Re-evaluated:Patient Re-evaluated prior to induction Oxygen Delivery Method: Nasal cannula Induction Type: IV induction Placement Confirmation: CO2 detector and positive ETCO2

## 2017-04-09 NOTE — Interval H&P Note (Signed)
History and Physical Interval Note:  04/09/2017 12:44 PM  [name]  has presented today for surgery, with the diagnosis of fecal incontinence  The various methods of treatment have been discussed with the patient and family. After consideration of risks, benefits and other options for treatment, the patient has consented to  Procedure(s): SACRAL NERVE STIMULATOR IMPLANTATION (N/A) as a surgical intervention .  The patient's history has been reviewed, patient examined, no change in status, stable for surgery.  I have reviewed the patient's chart and labs.  Questions were answered to the patient's satisfaction.  Risks include bleeding, pain and need for additional surgery.      Rosario Adie, MD  [facility]

## 2017-04-09 NOTE — Transfer of Care (Signed)
Immediate Anesthesia Transfer of Care Note  Patient: [name]  Procedure(s) Performed: SACRAL NERVE STIMULATOR IMPLANTATION STAGE ONE (N/A Back)  Patient Location: PACU  Anesthesia Type:MAC  Level of Consciousness: awake, alert , oriented and patient cooperative  Airway & Oxygen Therapy: Patient Spontanous Breathing and Patient connected to nasal cannula oxygen  Post-op Assessment: Report given to RN and Post -op Vital signs reviewed and stable  Post vital signs: Reviewed and stable  Last Vitals:  Vitals:   04/09/17 1243 04/09/17 1618  BP: (!) 104/58   Pulse: 92 71  Resp: 16   Temp: 36.9 C   SpO2: 98% 100%    Last Pain:  Vitals:   04/09/17 1311  TempSrc:   PainSc: 4       Patients Stated Pain Goal: 4 (26/71/24 5809)  Complications: No apparent anesthesia complications

## 2017-04-09 NOTE — Anesthesia Preprocedure Evaluation (Addendum)
Anesthesia Evaluation  Patient identified by MRN, date of birth, ID band Patient awake    Reviewed: Allergy & Precautions, H&P , Patient's Chart, lab work & pertinent test results, reviewed documented beta blocker date and time   History of Anesthesia Complications (+) PONV and history of anesthetic complications  Airway Mallampati: II  TM Distance: >3 FB Neck ROM: full    Dental no notable dental hx. (+) Dental Advisory Given   Pulmonary COPD, Current Smoker, former smoker,    Pulmonary exam normal breath sounds clear to auscultation       Cardiovascular negative cardio ROS  + dysrhythmias Supra Ventricular Tachycardia  Rhythm:regular Rate:Normal     Neuro/Psych PSYCHIATRIC DISORDERS Anxiety Depression TIA   GI/Hepatic negative GI ROS, Neg liver ROS,   Endo/Other  negative endocrine ROS  Renal/GU negative Renal ROS     Musculoskeletal  (+) Arthritis ,   Abdominal   Peds  Hematology  (+) anemia ,   Anesthesia Other Findings   Reproductive/Obstetrics                           Anesthesia Physical  Anesthesia Plan  ASA: II  Anesthesia Plan: MAC   Post-op Pain Management:    Induction: Intravenous  PONV Risk Score and Plan: 2 and Ondansetron and Dexamethasone  Airway Management Planned: Natural Airway and Simple Face Mask  Additional Equipment:   Intra-op Plan:   Post-operative Plan:   Informed Consent: I have reviewed the patients History and Physical, chart, labs and discussed the procedure including the risks, benefits and alternatives for the proposed anesthesia with the patient or authorized representative who has indicated his/her understanding and acceptance.   Dental advisory given  Plan Discussed with: CRNA, Anesthesiologist and Surgeon  Anesthesia Plan Comments: (   )       Anesthesia Quick Evaluation

## 2017-04-12 ENCOUNTER — Ambulatory Visit (HOSPITAL_COMMUNITY)
Admission: RE | Admit: 2017-04-12 | Discharge: 2017-04-12 | Disposition: A | Discharge status: Home / Self Care | Payer: Medicare Other | Source: Ambulatory Visit | Attending: Internal Medicine | Admitting: Internal Medicine

## 2017-04-12 DIAGNOSIS — I6523 Occlusion and stenosis of bilateral carotid arteries: Secondary | ICD-10-CM | POA: Diagnosis not present

## 2017-04-14 ENCOUNTER — Encounter (HOSPITAL_BASED_OUTPATIENT_CLINIC_OR_DEPARTMENT_OTHER): Payer: Self-pay | Admitting: *Deleted

## 2017-04-14 ENCOUNTER — Other Ambulatory Visit: Payer: Self-pay

## 2017-04-14 NOTE — Progress Notes (Signed)
SPOKE W/ PT PHONE VIA PHONE FOR PRE-OP INTERVIEW.  NPO AFTER MN.  ARRIVE AT 0745.  NEEDS ISTAT.  CURRENT EKG IN CHART AND Epic.  WILL TAKE ATENOLOL AM DOS W/ SIPS OF WATER.  PT IS DIFFICULT IV STICK, PER PT CRNA STARTED IV LAST VISIT 04-06-2017.  ALSO PT STATED HAD URINARY RETENTION POST OP SHE HAS ASKED DR Marcello Moores TO PLACE INTRAOP TO GO HOME WITH.

## 2017-04-21 ENCOUNTER — Encounter (HOSPITAL_BASED_OUTPATIENT_CLINIC_OR_DEPARTMENT_OTHER)
Admission: RE | Disposition: A | Discharge status: Home / Self Care | Payer: Self-pay | Source: Ambulatory Visit | Attending: General Surgery

## 2017-04-21 ENCOUNTER — Other Ambulatory Visit: Payer: Self-pay

## 2017-04-21 ENCOUNTER — Encounter (HOSPITAL_BASED_OUTPATIENT_CLINIC_OR_DEPARTMENT_OTHER): Payer: Self-pay | Admitting: *Deleted

## 2017-04-21 ENCOUNTER — Ambulatory Visit (HOSPITAL_BASED_OUTPATIENT_CLINIC_OR_DEPARTMENT_OTHER): Payer: Medicare Other | Admitting: Anesthesiology

## 2017-04-21 ENCOUNTER — Ambulatory Visit (HOSPITAL_BASED_OUTPATIENT_CLINIC_OR_DEPARTMENT_OTHER)
Admission: RE | Admit: 2017-04-21 | Discharge: 2017-04-21 | Disposition: A | Discharge status: Home / Self Care | Payer: Medicare Other | Source: Ambulatory Visit | Attending: General Surgery | Admitting: General Surgery

## 2017-04-21 DIAGNOSIS — Z880 Allergy status to penicillin: Secondary | ICD-10-CM | POA: Insufficient documentation

## 2017-04-21 DIAGNOSIS — Z888 Allergy status to other drugs, medicaments and biological substances status: Secondary | ICD-10-CM | POA: Diagnosis not present

## 2017-04-21 DIAGNOSIS — M1990 Unspecified osteoarthritis, unspecified site: Secondary | ICD-10-CM | POA: Diagnosis not present

## 2017-04-21 DIAGNOSIS — J449 Chronic obstructive pulmonary disease, unspecified: Secondary | ICD-10-CM | POA: Insufficient documentation

## 2017-04-21 DIAGNOSIS — Z885 Allergy status to narcotic agent status: Secondary | ICD-10-CM | POA: Insufficient documentation

## 2017-04-21 DIAGNOSIS — Z7982 Long term (current) use of aspirin: Secondary | ICD-10-CM | POA: Diagnosis not present

## 2017-04-21 DIAGNOSIS — R011 Cardiac murmur, unspecified: Secondary | ICD-10-CM | POA: Insufficient documentation

## 2017-04-21 DIAGNOSIS — R159 Full incontinence of feces: Secondary | ICD-10-CM | POA: Insufficient documentation

## 2017-04-21 DIAGNOSIS — Z79899 Other long term (current) drug therapy: Secondary | ICD-10-CM | POA: Insufficient documentation

## 2017-04-21 DIAGNOSIS — D649 Anemia, unspecified: Secondary | ICD-10-CM | POA: Insufficient documentation

## 2017-04-21 DIAGNOSIS — K580 Irritable bowel syndrome with diarrhea: Secondary | ICD-10-CM | POA: Insufficient documentation

## 2017-04-21 DIAGNOSIS — M549 Dorsalgia, unspecified: Secondary | ICD-10-CM | POA: Diagnosis not present

## 2017-04-21 DIAGNOSIS — Z881 Allergy status to other antibiotic agents status: Secondary | ICD-10-CM | POA: Insufficient documentation

## 2017-04-21 DIAGNOSIS — F418 Other specified anxiety disorders: Secondary | ICD-10-CM | POA: Diagnosis not present

## 2017-04-21 DIAGNOSIS — I739 Peripheral vascular disease, unspecified: Secondary | ICD-10-CM | POA: Diagnosis not present

## 2017-04-21 DIAGNOSIS — G8929 Other chronic pain: Secondary | ICD-10-CM | POA: Diagnosis not present

## 2017-04-21 DIAGNOSIS — F17200 Nicotine dependence, unspecified, uncomplicated: Secondary | ICD-10-CM | POA: Insufficient documentation

## 2017-04-21 HISTORY — DX: Other specified personal risk factors, not elsewhere classified: Z91.89

## 2017-04-21 HISTORY — DX: Other specified health status: Z78.9

## 2017-04-21 HISTORY — DX: Other intervertebral disc degeneration, lumbosacral region without mention of lumbar back pain or lower extremity pain: M51.379

## 2017-04-21 HISTORY — DX: Other intervertebral disc degeneration, lumbosacral region: M51.37

## 2017-04-21 HISTORY — DX: Other cervical disc degeneration, unspecified cervical region: M50.30

## 2017-04-21 HISTORY — DX: Emphysema, unspecified: J43.9

## 2017-04-21 HISTORY — DX: Personal history of other (healed) physical injury and trauma: Z87.828

## 2017-04-21 HISTORY — DX: Prediabetes: R73.03

## 2017-04-21 HISTORY — DX: Unspecified right bundle-branch block: I45.10

## 2017-04-21 HISTORY — DX: Major depressive disorder, single episode, unspecified: F32.9

## 2017-04-21 HISTORY — DX: Personal history of other specified conditions: Z87.898

## 2017-04-21 HISTORY — DX: Peripheral vascular disease, unspecified: I73.9

## 2017-04-21 HISTORY — DX: Personal history of other diseases of the respiratory system: Z87.09

## 2017-04-21 HISTORY — DX: Disorder of arteries and arterioles, unspecified: I77.9

## 2017-04-21 HISTORY — DX: Unspecified osteoarthritis, unspecified site: M19.90

## 2017-04-21 LAB — POCT I-STAT, CHEM 8
BUN: 24 mg/dL — AB (ref 6–20)
CALCIUM ION: 1.28 mmol/L (ref 1.15–1.40)
CHLORIDE: 107 mmol/L (ref 101–111)
CREATININE: 0.7 mg/dL (ref 0.44–1.00)
Glucose, Bld: 91 mg/dL (ref 65–99)
HCT: 46 % (ref 36.0–46.0)
Hemoglobin: 15.6 g/dL — ABNORMAL HIGH (ref 12.0–15.0)
Potassium: 4.1 mmol/L (ref 3.5–5.1)
Sodium: 143 mmol/L (ref 135–145)
TCO2: 25 mmol/L (ref 22–32)

## 2017-04-21 SURGERY — INSERTION, PULSE GENERATOR, PERIPHERAL NERVE STIMULATOR
Anesthesia: Monitor Anesthesia Care

## 2017-04-21 MED ORDER — SODIUM CHLORIDE 0.9 % IV SOLN
250.0000 mL | INTRAVENOUS | Status: DC | PRN
  Filled 2017-04-21: qty 250

## 2017-04-21 MED ORDER — ACETAMINOPHEN 325 MG PO TABS
650.0000 mg | ORAL_TABLET | ORAL | Status: DC | PRN
  Filled 2017-04-21: qty 2

## 2017-04-21 MED ORDER — MEPERIDINE HCL 25 MG/ML IJ SOLN
6.2500 mg | INTRAMUSCULAR | Status: DC | PRN
  Filled 2017-04-21: qty 1

## 2017-04-21 MED ORDER — KETAMINE HCL-SODIUM CHLORIDE 100-0.9 MG/10ML-% IV SOSY
PREFILLED_SYRINGE | INTRAVENOUS | Status: AC
  Filled 2017-04-21: qty 10

## 2017-04-21 MED ORDER — ONDANSETRON HCL 4 MG/2ML IJ SOLN
INTRAMUSCULAR | Status: DC | PRN
  Administered 2017-04-21: 4 mg via INTRAVENOUS

## 2017-04-21 MED ORDER — PROPOFOL 500 MG/50ML IV EMUL
INTRAVENOUS | Status: AC
  Filled 2017-04-21: qty 100

## 2017-04-21 MED ORDER — LACTATED RINGERS IV SOLN
INTRAVENOUS | Status: DC
  Administered 2017-04-21: 09:00:00 via INTRAVENOUS
  Filled 2017-04-21: qty 1000

## 2017-04-21 MED ORDER — LIDOCAINE 2% (20 MG/ML) 5 ML SYRINGE
INTRAMUSCULAR | Status: DC | PRN
  Administered 2017-04-21: 50 mg via INTRAVENOUS

## 2017-04-21 MED ORDER — SODIUM CHLORIDE 0.9% FLUSH
3.0000 mL | INTRAVENOUS | Status: DC | PRN
  Filled 2017-04-21: qty 3

## 2017-04-21 MED ORDER — KETAMINE HCL 100 MG/ML IJ SOLN
INTRAMUSCULAR | Status: DC | PRN
  Administered 2017-04-21: 20 ug/kg/min via INTRAVENOUS

## 2017-04-21 MED ORDER — CEFAZOLIN SODIUM-DEXTROSE 2-4 GM/100ML-% IV SOLN
2.0000 g | INTRAVENOUS | Status: AC
  Administered 2017-04-21: 2 g via INTRAVENOUS
  Filled 2017-04-21: qty 100

## 2017-04-21 MED ORDER — MIDAZOLAM HCL 2 MG/2ML IJ SOLN
INTRAMUSCULAR | Status: DC | PRN
  Administered 2017-04-21: 2 mg via INTRAVENOUS

## 2017-04-21 MED ORDER — CEFAZOLIN SODIUM-DEXTROSE 2-4 GM/100ML-% IV SOLN
INTRAVENOUS | Status: AC
  Filled 2017-04-21: qty 100

## 2017-04-21 MED ORDER — SODIUM CHLORIDE 0.9% FLUSH
3.0000 mL | Freq: Two times a day (BID) | INTRAVENOUS | Status: DC
  Filled 2017-04-21: qty 3

## 2017-04-21 MED ORDER — PROPOFOL 500 MG/50ML IV EMUL
INTRAVENOUS | Status: DC | PRN
  Administered 2017-04-21: 200 ug/kg/min via INTRAVENOUS

## 2017-04-21 MED ORDER — LIDOCAINE 2% (20 MG/ML) 5 ML SYRINGE
INTRAMUSCULAR | Status: AC
  Filled 2017-04-21: qty 5

## 2017-04-21 MED ORDER — ACETAMINOPHEN 650 MG RE SUPP
650.0000 mg | RECTAL | Status: DC | PRN
  Filled 2017-04-21: qty 1

## 2017-04-21 MED ORDER — OXYCODONE HCL 5 MG PO TABS
5.0000 mg | ORAL_TABLET | ORAL | Status: DC | PRN
  Filled 2017-04-21: qty 2

## 2017-04-21 MED ORDER — FENTANYL CITRATE (PF) 100 MCG/2ML IJ SOLN
25.0000 ug | INTRAMUSCULAR | Status: DC | PRN
  Filled 2017-04-21: qty 1

## 2017-04-21 MED ORDER — DEXAMETHASONE SODIUM PHOSPHATE 10 MG/ML IJ SOLN
INTRAMUSCULAR | Status: AC
  Filled 2017-04-21: qty 1

## 2017-04-21 MED ORDER — ONDANSETRON HCL 4 MG/2ML IJ SOLN
INTRAMUSCULAR | Status: AC
  Filled 2017-04-21: qty 2

## 2017-04-21 MED ORDER — PROMETHAZINE HCL 25 MG/ML IJ SOLN
6.2500 mg | INTRAMUSCULAR | Status: DC | PRN
  Filled 2017-04-21: qty 1

## 2017-04-21 MED ORDER — DEXAMETHASONE SODIUM PHOSPHATE 10 MG/ML IJ SOLN
INTRAMUSCULAR | Status: DC | PRN
  Administered 2017-04-21: 5 mg via INTRAVENOUS

## 2017-04-21 MED ORDER — FENTANYL CITRATE (PF) 100 MCG/2ML IJ SOLN
INTRAMUSCULAR | Status: DC | PRN
  Administered 2017-04-21: 50 ug via INTRAVENOUS

## 2017-04-21 MED ORDER — BUPIVACAINE-EPINEPHRINE 0.5% -1:200000 IJ SOLN
INTRAMUSCULAR | Status: DC | PRN
  Administered 2017-04-21: 30 mL

## 2017-04-21 MED ORDER — FENTANYL CITRATE (PF) 100 MCG/2ML IJ SOLN
INTRAMUSCULAR | Status: AC
  Filled 2017-04-21: qty 2

## 2017-04-21 MED ORDER — MIDAZOLAM HCL 2 MG/2ML IJ SOLN
INTRAMUSCULAR | Status: AC
  Filled 2017-04-21: qty 2

## 2017-04-21 SURGICAL SUPPLY — 49 items
BAG URINE LEG 500ML (DRAIN) IMPLANT
BENZOIN TINCTURE PRP APPL 2/3 (GAUZE/BANDAGES/DRESSINGS) IMPLANT
BLADE HEX COATED 2.75 (ELECTRODE) ×2 IMPLANT
BLADE SURG 15 STRL LF DISP TIS (BLADE) ×1 IMPLANT
BLADE SURG 15 STRL SS (BLADE) ×1
CATH FOLEY 2WAY SLVR  5CC 16FR (CATHETERS)
CATH FOLEY 2WAY SLVR 5CC 16FR (CATHETERS) IMPLANT
COVER BACK TABLE 60X90IN (DRAPES) ×2 IMPLANT
COVER MAYO STAND STRL (DRAPES) ×2 IMPLANT
COVER PROBE W GEL 5X96 (DRAPES) ×2 IMPLANT
DERMABOND ADVANCED (GAUZE/BANDAGES/DRESSINGS) ×1
DERMABOND ADVANCED .7 DNX12 (GAUZE/BANDAGES/DRESSINGS) ×1 IMPLANT
DRAPE C-ARM 42X72 X-RAY (DRAPES) IMPLANT
DRAPE INCISE 23X17 IOBAN STRL (DRAPES) ×1
DRAPE INCISE IOBAN 23X17 STRL (DRAPES) ×1 IMPLANT
DRAPE LAPAROSCOPIC ABDOMINAL (DRAPES) ×2 IMPLANT
DRSG TEGADERM 4X4.75 (GAUZE/BANDAGES/DRESSINGS) ×2 IMPLANT
ELECT REM PT RETURN 9FT ADLT (ELECTROSURGICAL)
ELECTRODE REM PT RTRN 9FT ADLT (ELECTROSURGICAL) IMPLANT
GAUZE SPONGE 4X4 12PLY STRL LF (GAUZE/BANDAGES/DRESSINGS) ×2 IMPLANT
GLOVE BIO SURGEON STRL SZ 6.5 (GLOVE) ×2 IMPLANT
GLOVE INDICATOR 7.0 STRL GRN (GLOVE) ×2 IMPLANT
GOWN PREVENTION PLUS XXLARGE (GOWN DISPOSABLE) ×2 IMPLANT
GOWN STRL REUS W/ TWL LRG LVL3 (GOWN DISPOSABLE) ×1 IMPLANT
GOWN STRL REUS W/TWL LRG LVL3 (GOWN DISPOSABLE) ×1
INTRODUCER GUIDE DILATR SHEATH (SET/KITS/TRAYS/PACK) IMPLANT
KIT RM TURNOVER CYSTO AR (KITS) ×2 IMPLANT
MANIFOLD NEPTUNE II (INSTRUMENTS) IMPLANT
NEEDLE FORAMEN 20GA 3.5  9CM (NEEDLE) IMPLANT
NEEDLE FORAMEN 20GA 5  12.5CM (NEEDLE) IMPLANT
NEEDLE HYPO 22GX1.5 SAFETY (NEEDLE) IMPLANT
PACK BASIN DAY SURGERY FS (CUSTOM PROCEDURE TRAY) ×2 IMPLANT
PAD ARMBOARD 7.5X6 YLW CONV (MISCELLANEOUS) IMPLANT
PENCIL BUTTON HOLSTER BLD 10FT (ELECTRODE) IMPLANT
PROGRAMMER ANTENNA EXT (UROLOGICAL SUPPLIES) IMPLANT
PROGRAMMER SMART TH90G01 (UROLOGICAL SUPPLIES) ×2 IMPLANT
PROGRAMMER STIMUL 2.2X1.1X3.7 (UROLOGICAL SUPPLIES) IMPLANT
SPONGE GAUZE 4X4 12PLY STER LF (GAUZE/BANDAGES/DRESSINGS) IMPLANT
STAPLER VISISTAT 35W (STAPLE) IMPLANT
STIMULATOR INTERSTIM 2X1.7X.3 (Orthopedic Implant) ×2 IMPLANT
STRIP CLOSURE SKIN 1/2X4 (GAUZE/BANDAGES/DRESSINGS) ×2 IMPLANT
SUT VIC AB 3-0 SH 27 (SUTURE) ×1
SUT VIC AB 3-0 SH 27X BRD (SUTURE) ×1 IMPLANT
SUT VICRYL 4-0 PS2 18IN ABS (SUTURE) ×2 IMPLANT
SYR BULB IRRIGATION 50ML (SYRINGE) ×2 IMPLANT
SYR CONTROL 10ML LL (SYRINGE) ×2 IMPLANT
TOWEL OR 17X24 6PK STRL BLUE (TOWEL DISPOSABLE) ×4 IMPLANT
TUBE CONNECTING 12X1/4 (SUCTIONS) IMPLANT
WATER STERILE IRR 500ML POUR (IV SOLUTION) ×2 IMPLANT

## 2017-04-21 NOTE — Transfer of Care (Signed)
Immediate Anesthesia Transfer of Care Note  Patient: [name]  Procedure(s) Performed: INSERTION OF PERIPHERAL NEUROSTIMULATOR (N/A )  Patient Location: PACU  Anesthesia Type:MAC  Level of Consciousness: awake, alert , oriented and patient cooperative  Airway & Oxygen Therapy: Patient Spontanous Breathing and Patient connected to nasal cannula oxygen  Post-op Assessment: Report given to RN and Post -op Vital signs reviewed and stable  Post vital signs: Reviewed and stable  Last Vitals:  Vitals:   04/21/17 0738 04/21/17 1004  BP: (!) 98/53   Pulse: 65   Resp: 16   Temp: 36.6 C (P) 36.6 C  SpO2: 98%     Last Pain:  Vitals:   04/21/17 0800  TempSrc:   PainSc: 4       Patients Stated Pain Goal: 7 (42/35/36 1443)  Complications: No apparent anesthesia complications

## 2017-04-21 NOTE — Anesthesia Preprocedure Evaluation (Addendum)
Anesthesia Evaluation  Patient identified by MRN, date of birth, ID band Patient awake    Reviewed: Allergy & Precautions, H&P , Patient's Chart, lab work & pertinent test results, reviewed documented beta blocker date and time   History of Anesthesia Complications (+) PONV and history of anesthetic complications  Airway Mallampati: II  TM Distance: >3 FB Neck ROM: full    Dental no notable dental hx. 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 Chronic back pain TIA   GI/Hepatic Fecal Incontinence   Endo/Other    Renal/GU      Musculoskeletal  (+) Arthritis , Osteoarthritis,    Abdominal   Peds  Hematology  (+) anemia ,   Anesthesia Other Findings   Reproductive/Obstetrics                           Anesthesia Physical  Anesthesia Plan  ASA: III  Anesthesia Plan: MAC   Post-op Pain Management:    Induction: Intravenous  PONV Risk Score and Plan: 3 and Ondansetron, Propofol infusion, TIVA and Midazolam  Airway Management Planned:   Additional Equipment:   Intra-op Plan:   Post-operative Plan:   Informed Consent: I have reviewed the patients History and Physical, chart, labs and discussed the procedure including the risks, benefits and alternatives for the proposed anesthesia with the patient or authorized representative who has indicated his/her understanding and acceptance.   Dental Advisory Given and Dental advisory given  Plan Discussed with: CRNA  Anesthesia Plan Comments:         Anesthesia Quick Evaluation

## 2017-04-21 NOTE — Op Note (Signed)
04/21/2017  9:56 AM  PATIENT:  [name]  66 y.o. female  Patient Care Team: Raina Mina., MD as PCP - General (Internal Medicine) Renette Butters, MD as Attending Physician (Orthopedic Surgery)  PRE-OPERATIVE DIAGNOSIS:  fecal incontinence  POST-OPERATIVE DIAGNOSIS:  fecal incontinence  PROCEDURE:   INSERTION OF PERIPHERAL NEUROSTIMULATOR    Surgeon(s): Leighton Ruff, MD  ASSISTANT: none   ANESTHESIA:   local and MAC  EBL:  Total I/O In: 100 [I.V.:100] Out: -   DRAINS: none   SPECIMEN:  No Specimen  DISPOSITION OF SPECIMEN:  N/A  COUNTS:  YES  PLAN OF CARE: Discharge to home after PACU  PATIENT DISPOSITION:  PACU - hemodynamically stable.  INDICATION: Fecal incontinence.  The patient has underwent the test phase for 10 days and has had 2 episodes since then.  This is a 90% reduction in symptoms.  The patient agrees to proceed with full implantation of the neuro stimulator device.   OR FINDINGS: Wire lead intact.  Impedence values < 1000.  DESCRIPTION: The patient was identified in the preoperative holding area and taken to the OR where they were laid prone on the operating room table.  MAC anesthesia was induced without difficulty. SCDs were also noted to be in place prior to the initiation of anesthesia.  The patient was then prepped and draped in the usual sterile fashion.   A surgical timeout was performed indicating the correct patient, procedure, positioning and need for preoperative antibiotics.  Local injection of Marcaine with epinephrine was administered.  The previous upper buttock incision was opened using blunt dissection and the lead/percutaneous connection was identified and evaluated. The percutaneous extension was cut on the braided wire and was removed from the field ensuring sterility. The subcutaneous pocket was enlarged to accommodate the internal neurostimulator (INS) and hemostasis was established. The sutures securing the protective  boot were cut and the boot retracted to expose the four set screws. Using the torque wrench, the screws were loosened and the percutaneous extension was disconnected from the lead. The boot and connection were removed and discarded.  The lead was cleaned and dried. The lead was inserted into the header of the InterStim neurostimulator until the blue tip was visualized at the distal window. The single set screw was tightened using the torque wrench until audible click was heard. The neurostimulator was placed into the pocket with the etched identification side placed upwards and any excessive lead was placed around the neurostimulator. The clinician programmer telemetry head, covered in a sterile sleeve, was placed over the implanted neurostimulator to ensure proper lead connection and that parameters were within normal range. Impedances were confirmed to be within normal limits, greater than 50 and less than 4,000 ohms. The wound was irrigated with sterile water and closed with a running 3-0 Vicryl subcuticular suture and a 4-0 Vicryl running skin suture. Counts were correct. Dermabond was placed over the incision. The patient was transferred to the PACU in satisfactory condition. Using the clinician programmer, the INS was programmed.   Patient was provided utilization instructions for the patient programmer prior to discharge.

## 2017-04-21 NOTE — Anesthesia Procedure Notes (Signed)
Procedure Name: MAC Date/Time: 04/21/2017 9:12 AM Performed by: Wanita Chamberlain, CRNA Pre-anesthesia Checklist: Patient identified, Emergency Drugs available, Suction available, Patient being monitored and Timeout performed Patient Re-evaluated:Patient Re-evaluated prior to induction Oxygen Delivery Method: Nasal cannula Induction Type: IV induction Placement Confirmation: positive ETCO2 and CO2 detector Dental Injury: Teeth and Oropharynx as per pre-operative assessment

## 2017-04-21 NOTE — Interval H&P Note (Signed)
History and Physical Interval Note:  04/21/2017 8:51 AM  [name]  has presented today for surgery, with the diagnosis of fecal incontinence  The various methods of treatment have been discussed with the patient and family. After consideration of risks, benefits and other options for treatment, the patient has consented to  Procedure(s): INSERTION OF PERIPHERAL NEUROSTIMULATOR (N/A) as a surgical intervention .  Patient has had device in place for 10 days.  She has had 2 episodes of incontinence during that time.  This is a >50% improvement in her symptoms.  The patient's history has been reviewed, patient examined, no change in status, stable for surgery.  I have reviewed the patient's chart and labs.  Questions were answered to the patient's satisfaction.     Donovyn Guidice C.

## 2017-04-21 NOTE — Discharge Instructions (Addendum)
POST OP INSTRUCTIONS  Always review your discharge instruction sheet given to you by the facility where your surgery was performed.   1. Take your pain medication as prescribed, if needed. If narcotic pain medicine is not needed, then you make take acetaminophen (Tylenol) or ibuprofen (Advil) as needed.  2. Take your usually prescribed medications unless otherwise directed. 3. If you need a refill on your pain medication, please contact our office. All narcotic pain medicine now requires a paper prescription.  Phoned in and fax refills are no longer allowed by law.  Prescriptions will not be filled after 5 pm or on weekends.  4. You should follow a light diet for the remainder of the day after your procedure. 5. Most patients will experience some mild swelling and/or bruising in the area of the incision. It may take several days to resolve. 6. It is common to experience some constipation if taking pain medication after surgery. Increasing fluid intake and taking a stool softener (such as Colace) will usually help or prevent this problem from occurring. A mild laxative (Milk of Magnesia or Miralax) should be taken according to package directions if there are no bowel movements after 48 hours.  7. Unless discharge instructions indicate otherwise, you may remove your bandages 48 hours after surgery, and you may shower at that time. You may have steri-strips (small white skin tapes) in place directly over the incision.  These strips should be left on the skin for 7-10 days.  If your surgeon used Dermabond (skin glue) on the incision, you may shower in 24 hours.  The glue will flake off over the next 2-3 weeks.  8. ACTIVITIES:  Limit activity for the next 72 hours. Do no strenuous exercise or activity for 1 week. You may drive when you are no longer taking prescription pain medication, you can comfortably wear a seatbelt, and you can maneuver your car. 10.You may need to see your doctor in the office for a  follow-up appointment.  Please       check with your doctor.  11.When you receive a new Port-a-Cath, you will get a product guide and        ID card.  Please keep them in case you need them.  WHEN TO CALL YOUR DOCTOR [phone]): 1. Fever over 101.0 2. Chills 3. Continued bleeding from incision 4. Increased redness and tenderness at the site 5. Shortness of breath, difficulty breathing   The clinic staff is available to answer your questions during regular business hours. Please dont hesitate to call and ask to speak to one of the nurses or medical assistants for clinical concerns. If you have a medical emergency, go to the nearest emergency room or call 911.  A surgeon from [facility] is always on call at the hospital.     For further information, please visit [email or web address]  Post Anesthesia Home Care Instructions  Activity: Get plenty of rest for the remainder of the day. A responsible individual must stay with you for 24 hours following the procedure.  For the next 24 hours, DO NOT: -Drive a car -Paediatric nurse -Drink alcoholic beverages -Take any medication unless instructed by your physician -Make any legal decisions or sign important papers.  Meals: Start with liquid foods such as gelatin or soup. Progress to regular foods as tolerated. Avoid greasy, spicy, heavy foods. If nausea and/or vomiting occur, drink only clear liquids until the nausea and/or vomiting subsides. Call your physician if vomiting continues.  Special Instructions/Symptoms:  Your throat may feel dry or sore from the anesthesia or the breathing tube placed in your throat during surgery. If this causes discomfort, gargle with warm salt water. The discomfort should disappear within 24 hours.  If you had a scopolamine patch placed behind your ear for the management of post- operative nausea and/or vomiting:  1. The medication in the patch is effective for 72 hours, after  which it should be removed.  Wrap patch in a tissue and discard in the trash. Wash hands thoroughly with soap and water. 2. You may remove the patch earlier than 72 hours if you experience unpleasant side effects which may include dry mouth, dizziness or visual disturbances. 3. Avoid touching the patch. Wash your hands with soap and water after contact with the patch.

## 2017-04-22 ENCOUNTER — Other Ambulatory Visit: Payer: Self-pay | Admitting: Cardiovascular Disease

## 2017-04-22 DIAGNOSIS — I6523 Occlusion and stenosis of bilateral carotid arteries: Secondary | ICD-10-CM

## 2017-04-23 NOTE — Anesthesia Postprocedure Evaluation (Signed)
Anesthesia Post Note  Patient: [name]  Procedure(s) Performed: INSERTION OF PERIPHERAL NEUROSTIMULATOR (N/A )     Patient location during evaluation: PACU Anesthesia Type: MAC Level of consciousness: awake and alert Pain management: pain level controlled Vital Signs Assessment: post-procedure vital signs reviewed and stable Respiratory status: spontaneous breathing Cardiovascular status: stable Anesthetic complications: no    Last Vitals:  Vitals:   04/21/17 1030 04/21/17 1107  BP: (!) 86/55 (!) 87/43  Pulse: 70 66  Resp: 18 16  Temp:  36.7 C  SpO2: 94% 95%    Last Pain:  Vitals:   04/22/17 1029  TempSrc:   PainSc: 0-No pain   Pain Goal: Patients Stated Pain Goal: 7 (04/21/17 0800)               Nolon Nations

## 2017-05-06 ENCOUNTER — Encounter (HOSPITAL_COMMUNITY): Payer: Medicare Other

## 2017-05-28 DIAGNOSIS — M4150 Other secondary scoliosis, site unspecified: Secondary | ICD-10-CM | POA: Diagnosis not present

## 2017-05-28 DIAGNOSIS — M4712 Other spondylosis with myelopathy, cervical region: Secondary | ICD-10-CM | POA: Diagnosis not present

## 2017-05-28 DIAGNOSIS — M961 Postlaminectomy syndrome, not elsewhere classified: Secondary | ICD-10-CM | POA: Diagnosis not present

## 2017-05-28 DIAGNOSIS — M4726 Other spondylosis with radiculopathy, lumbar region: Secondary | ICD-10-CM | POA: Diagnosis not present

## 2017-06-01 ENCOUNTER — Other Ambulatory Visit: Payer: Self-pay | Admitting: *Deleted

## 2017-06-01 DIAGNOSIS — I779 Disorder of arteries and arterioles, unspecified: Secondary | ICD-10-CM

## 2017-06-01 DIAGNOSIS — I739 Peripheral vascular disease, unspecified: Principal | ICD-10-CM

## 2017-06-07 ENCOUNTER — Other Ambulatory Visit: Payer: Self-pay | Admitting: Cardiovascular Disease

## 2017-06-07 DIAGNOSIS — R339 Retention of urine, unspecified: Secondary | ICD-10-CM | POA: Diagnosis not present

## 2017-06-07 DIAGNOSIS — K589 Irritable bowel syndrome without diarrhea: Secondary | ICD-10-CM | POA: Diagnosis not present

## 2017-06-07 DIAGNOSIS — Z79899 Other long term (current) drug therapy: Secondary | ICD-10-CM | POA: Diagnosis not present

## 2017-06-07 DIAGNOSIS — I471 Supraventricular tachycardia: Secondary | ICD-10-CM | POA: Diagnosis not present

## 2017-06-07 DIAGNOSIS — E782 Mixed hyperlipidemia: Secondary | ICD-10-CM | POA: Diagnosis not present

## 2017-06-07 DIAGNOSIS — M519 Unspecified thoracic, thoracolumbar and lumbosacral intervertebral disc disorder: Secondary | ICD-10-CM | POA: Diagnosis not present

## 2017-06-07 DIAGNOSIS — M5090 Cervical disc disorder, unspecified, unspecified cervical region: Secondary | ICD-10-CM | POA: Diagnosis not present

## 2017-06-07 DIAGNOSIS — N390 Urinary tract infection, site not specified: Secondary | ICD-10-CM | POA: Diagnosis not present

## 2017-06-07 DIAGNOSIS — I6529 Occlusion and stenosis of unspecified carotid artery: Secondary | ICD-10-CM | POA: Diagnosis not present

## 2017-06-07 DIAGNOSIS — F330 Major depressive disorder, recurrent, mild: Secondary | ICD-10-CM | POA: Diagnosis not present

## 2017-06-07 DIAGNOSIS — R7303 Prediabetes: Secondary | ICD-10-CM | POA: Diagnosis not present

## 2017-06-07 DIAGNOSIS — J449 Chronic obstructive pulmonary disease, unspecified: Secondary | ICD-10-CM | POA: Diagnosis not present

## 2017-06-10 ENCOUNTER — Other Ambulatory Visit: Payer: Self-pay

## 2017-06-10 MED ORDER — ATENOLOL 25 MG PO TABS: 12.5000 mg | ORAL_TABLET | Freq: Two times a day (BID) | ORAL | 3 refills | Status: DC

## 2017-06-16 DIAGNOSIS — G894 Chronic pain syndrome: Secondary | ICD-10-CM | POA: Diagnosis not present

## 2017-06-16 DIAGNOSIS — M47816 Spondylosis without myelopathy or radiculopathy, lumbar region: Secondary | ICD-10-CM | POA: Diagnosis not present

## 2017-06-16 DIAGNOSIS — M961 Postlaminectomy syndrome, not elsewhere classified: Secondary | ICD-10-CM | POA: Diagnosis not present

## 2017-06-16 DIAGNOSIS — G4700 Insomnia, unspecified: Secondary | ICD-10-CM | POA: Diagnosis not present

## 2017-07-06 DIAGNOSIS — N390 Urinary tract infection, site not specified: Secondary | ICD-10-CM | POA: Diagnosis not present

## 2017-07-21 DIAGNOSIS — B373 Candidiasis of vulva and vagina: Secondary | ICD-10-CM | POA: Diagnosis not present

## 2017-07-21 DIAGNOSIS — N390 Urinary tract infection, site not specified: Secondary | ICD-10-CM | POA: Diagnosis not present

## 2017-08-14 DIAGNOSIS — J218 Acute bronchiolitis due to other specified organisms: Secondary | ICD-10-CM | POA: Diagnosis not present

## 2017-09-07 ENCOUNTER — Ambulatory Visit (INDEPENDENT_AMBULATORY_CARE_PROVIDER_SITE_OTHER): Payer: Medicare Other | Admitting: Cardiovascular Disease

## 2017-09-07 ENCOUNTER — Encounter: Payer: Self-pay | Admitting: Cardiovascular Disease

## 2017-09-07 VITALS — BP 115/68 | HR 55 | Ht 67.0 in | Wt 102.2 lb

## 2017-09-07 DIAGNOSIS — Z122 Encounter for screening for malignant neoplasm of respiratory organs: Secondary | ICD-10-CM | POA: Diagnosis not present

## 2017-09-07 DIAGNOSIS — I471 Supraventricular tachycardia, unspecified: Secondary | ICD-10-CM

## 2017-09-07 DIAGNOSIS — I059 Rheumatic mitral valve disease, unspecified: Secondary | ICD-10-CM | POA: Diagnosis not present

## 2017-09-07 DIAGNOSIS — R001 Bradycardia, unspecified: Secondary | ICD-10-CM | POA: Diagnosis not present

## 2017-09-07 DIAGNOSIS — I6523 Occlusion and stenosis of bilateral carotid arteries: Secondary | ICD-10-CM

## 2017-09-07 DIAGNOSIS — Z87891 Personal history of nicotine dependence: Secondary | ICD-10-CM

## 2017-09-07 NOTE — Patient Instructions (Signed)
Medication Instructions:  Your physician recommends that you continue on your current medications as directed. Please refer to the Current Medication list given to you today.  Testing/Procedures: Non-Cardiac CT scanning, (CAT scanning), is a noninvasive, special x-ray that produces cross-sectional images of the body using x-rays and a computer. CT scans help physicians diagnose and treat medical conditions. For some CT exams, a contrast material is used to enhance visibility in the area of the body being studied. CT scans provide greater clarity and reveal more details than regular x-ray exams.  Follow-Up: Your physician wants you to follow-up in: 6 months with Dr. Claiborne Billings.  You will receive a reminder letter in the mail two months in advance. If you don't receive a letter, please call our office to schedule the follow-up appointment.   Any Other Special Instructions Will Be Listed Below (If Applicable).     If you need a refill on your cardiac medications before your next appointment, please call your pharmacy.

## 2017-09-07 NOTE — Progress Notes (Signed)
Patient ID: [name], female   DOB: 05-Aug-1950, 67 y.o.   MRN: [id number]    PCP: Dr. Gilford Rile  HPI: [name] is a 67 y.o. female resents to the office today for a 12 month followup cardiology evaluation.   [name] has a history of supraventricular tachycardia which has been fairly well-controlled on combination verapamil SR  plus beta blocker therapy with atenolol.  She has a history of underlying mitral valve prolapse, mild carotid disease, hyperlipidemia, as well as mild COPD. She had smoked for over 30 years but quit smoking approximately 7 years ago.  Five years ago, she was hospitalized after being bitten by a copperhead snake and required 5 days in the hospital. Apparently her cardiac medicines were held at that time and during the hospitalization she had recurrent episodes of tachycardia dysrhythmia. As long as she takes her medicines, and she feels that her heart rhythm is controlled. There is rare intermittent palpitations.typically, these episodes may last for under a minute and typically resolve with the Valsalva maneuver if they don't resolve spontaneously.  When I saw her in 2017 she was unaware of any breakthrough arrhythmias.  At that time her blood pressure was low and there was mild orthostatic drop.  Her resting pulse was 60.  I recommended that she continue to take Verapamil SR 240 mg but I reduced her atenolol dose to 12.5  mg twice a day.  She underwent a follow-up echo Doppler study on 10/25/2015.  This showed an EF of 60-65%.  There was normal wall motion.  There was no significant valvular pathology.  She was given preoperative clearance to undergo hip surgery by Dr. Fredonia Highland which was done in July 2017 and she tolerated this well.  I last saw her, she had undergone  a panoramic dental x-ray.  Incidentally, she was noted to have calcified carotid atheromas of the internal carotid artery suggestive of atherosclerotic disease.  I scheduled her for carotid duplex  imaging which showed heterogeneous plaque bilaterally with narrowing in the 1-39% range.  She had normal subclavian arteries, and patent vertebral arteries with antegrade flow.  She resumed smoking after being off cigarettes for 2 years.  She resumed smoking after a death of a family member.    Since I saw her one year ago, she denies any episodes of chest pain or awareness of palpitations.  She is concerned about her weight loss.  She again quit smoking 1 month ago.  Her smoking duration has been over 40 years.  She admits to exertional dyspnea.  She denies any prolonged cough.  In July 2018, LDL cholesterol was 78 on atorvastatin and only 5 mg.  She has continued to be on atenolol 12.5 mg twice a day and verapamil 240 mg daily.  She is on aspirin.  In December 2018 carotid duplex imaging showed mild bilateral carotid plaque which was not hemodynamically significant.  She presents for one year evaluation.  Past Medical History:  Diagnosis Date  . Anxiety   . Back pain, chronic   . Complication of anesthesia    PT HAS POST OP URINARY RETENTION:  per patient "difficult stick- needs central line or picc"  04-06-2017 AT [facility] IV STARTED BY CRNA NO ISSUE  . COPD with emphysema ([place])   . DDD (degenerative disc disease), cervical    POST MULTIPLE SURGERY'S  . DDD (degenerative disc disease), lumbosacral    POST MULTIPLE SURGERY'S  . Difficult intravenous access   . Fecal  incontinence    once a week----  TREATED W/ SACRAL NERVE STIMULATOR PLACED 12/ 2018  . Heart murmur   . History of chronic bronchitis   . History of spinal cord injury 02/09/2007   PT FELL-- S/P  CERVICAL SPINE SURGERY  . History of urinary retention    PT HAS POST OP URINARY RETENTION DUE TO ANESTHESIA  . History of venomous snake bite 2013   RIGHT MIDDLE FINGER BY COOPERHEAD--- TREATMENT W/ ANTIVENOM  . Hyperlipemia   . IBS (irritable bowel syndrome)    flairs up occasionally esp if stressed  . Incomplete right bundle branch  block   . Major depression   . Mild carotid artery disease [facility]) cardiologist-  dr Claiborne Billings   mild bilateral ICA per duplex 04-12-2017  1-39%  . MVP (mitral valve prolapse)    no evidence stenosis or regurgitation per last echo 10-25-2015  . OA (osteoarthritis)   . PONV (postoperative nausea and vomiting)   . Pre-diabetes   . PSVT (paroxysmal supraventricular tachycardia) [facility])    cardiologist-- dr Claiborne Billings    Past Surgical History:  Procedure Laterality Date  . ABDOMINAL RECTOPEXY  2011       dr fuller  [facility])   prolapse rectum  . ANAL RECTAL MANOMETRY N/A 03/01/2017   Procedure: ANO RECTAL MANOMETRY;  Surgeon: Leighton Ruff, MD;  Location: [facility];  Service: Endoscopy;  Laterality: N/A;  . ANTERIOR CERVICAL DECOMP/DISCECTOMY FUSION  05-28-2000    dr Louanne Skye   C3-4  &  C7-T1  . ANTERIOR CERVICAL DECOMPRESSION LAMINECTOMY AND FUSION  02-09-2007    dr Arnoldo Morale   decompression/ laminectomy C3-4 & C5-6;  laminectomy C2; fusion C2-5  . BREAST SURGERY  1976   breast reduction  . COLONOSCOPY    . FORAMINOTOMY 1 LEVEL  11-03-1999   dr Louanne Skye  [facility]   left C7-T1 w/ decompression left C8  . KNEE SURGERY  AGE 34   bone spur    . LUMBAR LAMINECTOMY/ DECOMPRESSION WITH MET-RX  02-09-2005  dr Flavia Shipper   L2-3 laminectomy left cage; fusion L2-4  . POSTERIOR CERVICAL FUSION/FORAMINOTOMY  12-08-2000    dr Louanne Skye   right forminotomy C7-T1 and posterior fusion  . RIGHT THUMB COLLATERAL LIGAMENT RECONSTRUCTION  02-08-2003   dr Amedeo Plenty  . RIGHT THUMB LIGAMENT REPAIR, ARTHROTOMY, SYNOVECTOMY  10-26-2002    dr Amedeo Plenty  . [place] to 01/2007   total 14 surgery's  cervical and lumbar  (including fusion's, diskectomy, laminectomy's, foraminotomy's)  . TONSILLECTOMY    . TOTAL HIP ARTHROPLASTY Right 07/31/2014   Procedure: TOTAL HIP ARTHROPLASTY ANTERIOR APPROACH;  Surgeon: Renette Butters, MD;  Location: [place];  Service: Orthopedics;  Laterality: Right;  . TOTAL HIP ARTHROPLASTY Left 11/19/2015     Procedure: TOTAL HIP ARTHROPLASTY ANTERIOR APPROACH;  Surgeon: Renette Butters, MD;  Location: [place];  Service: Orthopedics;  Laterality: Left;  . TRANSTHORACIC ECHOCARDIOGRAM  10-25-2015   dr t. Claiborne Billings   ef 60-655/  no evidence MV stenosis or regurgitation/    . TUBAL LIGATION      Allergies  Allergen Reactions  . Bupropion Hcl Swelling  . Celecoxib Swelling  . Naproxen Swelling  . Methadone Nausea And Vomiting  . Morphine Itching    Current Outpatient Medications  Medication Sig Dispense Refill  . aspirin EC 81 MG tablet Take 81 mg by mouth at bedtime.    Marland Kitchen atenolol (TENORMIN) 25 MG tablet Take 0.5 tablets (12.5 mg total) by mouth 2 (  two) times daily. 90 tablet 3  . atorvastatin (LIPITOR) 10 MG tablet Take 5 mg by mouth daily.    . baclofen (LIORESAL) 20 MG tablet Take 20 mg by mouth 3 (three) times daily as needed for muscle spasms.     . Calcium Carbonate-Vitamin D (CALCIUM + D PO) Take 1 tablet by mouth 2 (two) times daily. Calcium Magnesium Zinc and Vitamin D    . clonazePAM (KLONOPIN) 1 MG tablet Take 1 mg by mouth at bedtime.    . fish oil-omega-3 fatty acids 1000 MG capsule Take 1 g by mouth daily.     . Multiple Vitamin (MULTIVITAMIN WITH MINERALS) TABS tablet Take 1 tablet by mouth daily.    . verapamil (CALAN-SR) 240 MG CR tablet TAKE 1 TABLET BY MOUTH DAILY AT BEDTIME 90 tablet 3   No current facility-administered medications for this visit.     Social History   Socioeconomic History  . Marital status: Married    Spouse name: Not on file  . Number of children: Not on file  . Years of education: Not on file  . Highest education level: Not on file  Occupational History  . Not on file  Social Needs  . Financial resource strain: Not on file  . Food insecurity:    Worry: Not on file    Inability: Not on file  . Transportation needs:    Medical: Not on file    Non-medical: Not on file  Tobacco Use  . Smoking status: Former Smoker    Packs/day: 0.10     Types: Cigarettes    Last attempt to quit: 08/08/2017    Years since quitting: 0.0  . Smokeless tobacco: Never Used  Substance and Sexual Activity  . Alcohol use: Yes    Alcohol/week: 0.0 oz    Types: 5 - 6 Glasses of wine per week    Comment: occasional  . Drug use: No  . Sexual activity: Yes  Lifestyle  . Physical activity:    Days per week: Not on file    Minutes per session: Not on file  . Stress: Not on file  Relationships  . Social connections:    Talks on phone: Not on file    Gets together: Not on file    Attends religious service: Not on file    Active member of club or organization: Not on file    Attends meetings of clubs or organizations: Not on file    Relationship status: Not on file  . Intimate partner violence:    Fear of current or ex partner: Not on file    Emotionally abused: Not on file    Physically abused: Not on file    Forced sexual activity: Not on file  Other Topics Concern  . Not on file  Social History Narrative  . Not on file   Social history is notable in that she is married. She has one stepchild and 2 stepgrandchildren. She does walk. She has resumed tobacco.  Family History  Problem Relation Age of Onset  . COPD Father   . Diabetes Mother   . Heart disease Mother   . Kidney disease Mother   . Hypertension Mother     ROS General: Negative; No fevers, chills, or night sweats;  HEENT: Negative; No changes in vision or hearing, sinus congestion, difficulty swallowing Pulmonary: positive for mild COPD; No cough, wheezing, shortness of breath, hemoptysis Cardiovascular: Negative; No chest pain, presyncope, syncope, palpitations GI: Positive for irritable  bowel syndrome GU: Negative; No dysuria, hematuria, or difficulty voiding Musculoskeletal: Status post left hip replacement surgery. Hematologic/Oncology: Negative; no easy bruising, bleeding Endocrine: Negative; no heat/cold intolerance; no diabetes Neuro: Negative; no changes in  balance, headaches Skin: Negative; No rashes or skin lesions Psychiatric: positive for mild depression for which she takes Zoloft.; No behavioral problems,  Sleep: Negative; No snoring, daytime sleepiness, hypersomnolence, bruxism, restless legs, hypnogognic hallucinations, no cataplexy Other comprehensive 14 point system review is negative.   PE BP 115/68   Pulse (!) 55   Ht '5\' 7"'$  (1.702 m)   Wt 102 lb 3.2 oz (46.4 kg)   BMI 16.01 kg/m    Repeat blood pressure by me was 112/70 supine and 102/70 standing  Wt Readings from Last 3 Encounters:  09/07/17 102 lb 3.2 oz (46.4 kg)  04/21/17 109 lb 11.2 oz (49.8 kg)  04/09/17 109 lb 4.8 oz (49.6 kg)   General: Alert, oriented, no distress.  Very thin appearing Skin: normal turgor, no rashes, warm and dry HEENT: Normocephalic, atraumatic. Pupils equal round and reactive to light; sclera anicteric; extraocular muscles intact;  Nose without nasal septal hypertrophy Mouth/Parynx benign; Mallinpatti scale 2 Neck: No JVD, no carotid bruits; normal carotid upstroke Lungs: Diffusely decreased breath sounds.  No overt wheezing. Chest wall: without tenderness to palpitation Heart: PMI not displaced, RRR, s1 s2 normal, 1/6 systolic murmur, midsystolic click consistent with mitral valve prolapse; no diastolic murmur, no rubs, gallops, thrills, or heaves Abdomen: soft, nontender; no hepatosplenomehaly, BS+; abdominal aorta nontender and not dilated by palpation. Back: no CVA tenderness Pulses 2+ Musculoskeletal: full range of motion, normal strength, no joint deformities Extremities: no clubbing cyanosis or edema, Homan's sign negative  Neurologic: grossly nonfocal; Cranial nerves grossly wnl Psychologic: Normal mood and affect   ECG (independently read by me): Bradycardia 55 bpm.  QS complex V1 V2.  Normal intervals.  No ectopy.  May 2018 ECG (independently read by me): Sinus bradycardia 56 bpm.  QS V1, V2.  Mild RV conduction delay.  Normal  intervals.  No ST segment changes.  November 2017 ECG (independently read by me): Sinus bradycardia 52 bpm.  QRS complex V1 V2.  Normal intervals.  June 2017 ECG (independently read by me): Normal sinus rhythm at 60 bpm.  First-degree AV block with a PR interval of 220 ms.  No significant ST changes.  Incomplete right bundle branch block.   November 2016 ECG (independently read by me): normal sinus rhythm with first-degree heart block with a PR interval at 270 ms.  QRS complex V1 V2.  November 2015 ECG (independently read by me.  (: Normal sinus rhythm at 64 bpm.  Borderline first degree AV block with a PR interval at 204 ms.  Mild RV conduction delay.  Anteroseptal Q waves, unchanged  October 2014ECG: Sinus rhythm with incomplete right bundle branch block. Anteroseptal Q waves unchanged.  LABS:  BMP Latest Ref Rng & Units 04/21/2017 11/03/2016 11/21/2015  Glucose 65 - 99 mg/dL 91 83 150(H)  BUN 6 - 20 mg/dL 24(H) 19 12  Creatinine 0.44 - 1.00 mg/dL 0.70 0.82 0.59  BUN/Creat Ratio 12 - 28 - 23 -  Sodium 135 - 145 mmol/L 143 144 139  Potassium 3.5 - 5.1 mmol/L 4.1 4.6 4.2  Chloride 101 - 111 mmol/L 107 103 107  CO2 20 - 29 mmol/L - 27 25  Calcium 8.7 - 10.3 mg/dL - 10.1 8.9   Hepatic Function Latest Ref Rng & Units 11/03/2016 11/07/2015 09/02/2015  Total Protein 6.0 - 8.5 g/dL 6.5 6.2(L) 5.9(L)  Albumin 3.6 - 4.8 g/dL 4.4 4.0 3.7  AST 0 - 40 IU/L '25 29 31  '$ ALT 0 - 32 IU/L '22 26 27  '$ Alk Phosphatase 39 - 117 IU/L 73 59 71  Total Bilirubin 0.0 - 1.2 mg/dL 0.4 0.4 0.4  Bilirubin, Direct 0.0 - 0.3 mg/dL - - -   CBC Latest Ref Rng & Units 04/21/2017 11/03/2016 11/21/2015  WBC 3.4 - 10.8 x10E3/uL - 5.6 9.1  Hemoglobin 12.0 - 15.0 g/dL 15.6(H) 15.5 11.1(L)  Hematocrit 36.0 - 46.0 % 46.0 47.2(H) 35.0(L)  Platelets 150 - 379 x10E3/uL - 212 148(L)   Lab Results  Component Value Date   MCV 99 (H) 11/03/2016   MCV 102.9 (H) 11/21/2015   MCV 104.2 (H) 11/20/2015   Lab Results  Component Value  Date   TSH 1.010 11/03/2016   Lipid Panel     Component Value Date/Time   CHOL 146 11/03/2016 0920   TRIG 111 11/03/2016 0920   HDL 46 11/03/2016 0920   CHOLHDL 3.2 11/03/2016 0920   CHOLHDL 3.3 02/28/2015 1023   VLDL 30 02/28/2015 1023   LDLCALC 78 11/03/2016 0920     RADIOLOGY: No results found.  IMPRESSION:  1. History of tobacco abuse   2. Encounter for screening for malignant neoplasm of respiratory organs   3. Mitral valve disorder   4. Sinus bradycardia   5. History of SVT (supraventricular tachycardia) (HCC)   6. Calcification of both carotid arteries     ASSESSMENT AND PLAN: [name] is a 67 year old female who has a history of SVT which had been fairly well-controlled on verapamil 240 mg as well as atenolol 12.5 mg twice a day.  She is unaware of any breakthrough SVT episodes.  Her blood pressure today is stable without significant orthostatic drop.  I reviewed her carotid duplex imaging which showed mild carotid plaque bilaterally in the 1 to 39% range.  Target LDL is less than 78.  She has been on atorvastatin 5 mg and she will increase this to 10 mg.  She admits to weight loss.  Her BMI is only 16.  With her greater than 40-year history of tobacco use with a pack year in excess of 60+ years I have recommended CT screening for lung CA in light of her weight loss.  I will refer her to Barbaraann Barthel, NP and pulmonary so that she will be able to qualify for the low dose CT chest lung CA screening protocol.  I again congratulated her on quitting smoking 1 month ago.  She is not having any anginal type symptoms.  She is on aspirin 81 mg which I would continue.  He will be following up with her primary physician Dr. Bea Graff in the very near future he will be rechecking laboratory.  I will see her in 6 months for reevaluation.  Time spent: 25 minutes Sara Sine, MD, [facility]  09/09/2017 11:18 AM

## 2017-09-09 ENCOUNTER — Encounter: Payer: Self-pay | Admitting: Cardiovascular Disease

## 2017-09-10 ENCOUNTER — Other Ambulatory Visit: Payer: Self-pay | Admitting: Acute Care

## 2017-09-10 DIAGNOSIS — Z87891 Personal history of nicotine dependence: Secondary | ICD-10-CM

## 2017-09-10 DIAGNOSIS — Z122 Encounter for screening for malignant neoplasm of respiratory organs: Secondary | ICD-10-CM

## 2017-09-13 DIAGNOSIS — Z79891 Long term (current) use of opiate analgesic: Secondary | ICD-10-CM | POA: Diagnosis not present

## 2017-09-13 DIAGNOSIS — G894 Chronic pain syndrome: Secondary | ICD-10-CM | POA: Diagnosis not present

## 2017-09-13 DIAGNOSIS — M961 Postlaminectomy syndrome, not elsewhere classified: Secondary | ICD-10-CM | POA: Diagnosis not present

## 2017-09-13 DIAGNOSIS — G4700 Insomnia, unspecified: Secondary | ICD-10-CM | POA: Diagnosis not present

## 2017-09-13 DIAGNOSIS — M62838 Other muscle spasm: Secondary | ICD-10-CM | POA: Diagnosis not present

## 2017-09-22 ENCOUNTER — Ambulatory Visit (INDEPENDENT_AMBULATORY_CARE_PROVIDER_SITE_OTHER)
Admission: RE | Admit: 2017-09-22 | Discharge: 2017-09-22 | Disposition: A | Discharge status: Home / Self Care | Payer: Medicare Other | Source: Ambulatory Visit | Attending: Acute Care | Admitting: Acute Care

## 2017-09-22 ENCOUNTER — Ambulatory Visit (INDEPENDENT_AMBULATORY_CARE_PROVIDER_SITE_OTHER): Payer: Medicare Other | Admitting: Acute Care

## 2017-09-22 ENCOUNTER — Encounter: Payer: Self-pay | Admitting: Acute Care

## 2017-09-22 DIAGNOSIS — Z87891 Personal history of nicotine dependence: Secondary | ICD-10-CM | POA: Diagnosis not present

## 2017-09-22 DIAGNOSIS — Z122 Encounter for screening for malignant neoplasm of respiratory organs: Secondary | ICD-10-CM

## 2017-09-22 NOTE — Progress Notes (Signed)
Shared Decision Making Visit Lung Cancer Screening Program [phone])   Eligibility:  Age 67 y.o.  Pack Years Smoking History Calculation 31 pack year smoking history (# packs/per year x # years smoked)  Recent History of coughing up blood  no  Unexplained weight loss? no ( >Than 15 pounds within the last 6 months )  Prior History Lung / other cancer no (Diagnosis within the last 5 years already requiring surveillance chest CT Scans).  Smoking Status Former Smoker  Former Smokers: Years since quit: < 1 year  Quit Date: 07/2017  Visit Components:  Discussion included one or more decision making aids. yes  Discussion included risk/benefits of screening. yes  Discussion included potential follow up diagnostic testing for abnormal scans. yes  Discussion included meaning and risk of over diagnosis. yes  Discussion included meaning and risk of False Positives. yes  Discussion included meaning of total radiation exposure. yes  Counseling Included:  Importance of adherence to annual lung cancer LDCT screening. yes  Impact of comorbidities on ability to participate in the program. yes  Ability and willingness to under diagnostic treatment. yes  Smoking Cessation Counseling:  Current Smokers:   Discussed importance of smoking cessation. yes  Information about tobacco cessation classes and interventions provided to patient. yes  Patient provided with "ticket" for LDCT Scan. yes  Symptomatic Patient. no  Counseling  Diagnosis Code: Tobacco Use Z72.0  Asymptomatic Patient yes  Counseling (Intermediate counseling: > three minutes counseling) Q7619  Former Smokers:   Discussed the importance of maintaining cigarette abstinence. yes  Diagnosis Code: Personal History of Nicotine Dependence. J09.326  Information about tobacco cessation classes and interventions provided to patient. Yes  Patient provided with "ticket" for LDCT Scan. yes  Written Order for Lung  Cancer Screening with LDCT placed in Epic. Yes (CT Chest Lung Cancer Screening Low Dose W/O CM) [id number] Z12.2-Screening of respiratory organs Z87.891-Personal history of nicotine dependence  Pt has lost 13 pounds over the last 6 months due to IBS and skipping meals 2/2 avoidance of explosive diarrhea.  I spent 25 minutes of face to face time with [name] discussing the risks and benefits of lung cancer screening. We viewed a power point together that explained in detail the above noted topics. We took the time to pause the power point at intervals to allow for questions to be asked and answered to ensure understanding. We discussed that she had taken the single most powerful action possible to decrease her risk of developing lung cancer when she quit smoking. I counseled her to remain smoke free, and to contact me if she ever had the desire to smoke again so that I can provide resources and tools to help support the effort to remain smoke free. We discussed the time and location of the scan, and that either  Sara Glassman RN or I will call with the results within  24-48 hours of receiving them. She has my card and contact information in the event she needs to speak with me, in addition to a copy of the power point we reviewed as a resource. She verbalized understanding of all of the above and had no further questions upon leaving the office.     I explained to the patient that there has been a high incidence of coronary artery disease noted on these exams. I explained that this is a non-gated exam therefore degree or severity cannot be determined. This patient is currently on statin therapy. I have asked the patient to  follow-up with their PCP regarding any incidental finding of coronary artery disease and management with diet or medication as they feel is clinically indicated. The patient verbalized understanding of the above and had no further questions.     Magdalen Spatz, NP 09/22/2017 12:02  PM

## 2017-09-29 ENCOUNTER — Other Ambulatory Visit: Payer: Self-pay | Admitting: Acute Care

## 2017-09-29 DIAGNOSIS — Z87891 Personal history of nicotine dependence: Secondary | ICD-10-CM

## 2017-09-29 DIAGNOSIS — Z122 Encounter for screening for malignant neoplasm of respiratory organs: Secondary | ICD-10-CM

## 2017-10-04 DIAGNOSIS — D3131 Benign neoplasm of right choroid: Secondary | ICD-10-CM | POA: Diagnosis not present

## 2017-10-04 DIAGNOSIS — H524 Presbyopia: Secondary | ICD-10-CM | POA: Diagnosis not present

## 2017-10-04 DIAGNOSIS — H2513 Age-related nuclear cataract, bilateral: Secondary | ICD-10-CM | POA: Diagnosis not present

## 2017-10-04 DIAGNOSIS — H5203 Hypermetropia, bilateral: Secondary | ICD-10-CM | POA: Diagnosis not present

## 2017-10-05 ENCOUNTER — Encounter: Payer: Self-pay | Admitting: Podiatry

## 2017-10-05 ENCOUNTER — Ambulatory Visit (INDEPENDENT_AMBULATORY_CARE_PROVIDER_SITE_OTHER): Payer: Medicare Other | Admitting: Podiatry

## 2017-10-05 VITALS — BP 109/58 | HR 79 | Temp 97.8°F | Resp 16 | Ht 68.0 in | Wt 105.0 lb

## 2017-10-05 DIAGNOSIS — M2042 Other hammer toe(s) (acquired), left foot: Secondary | ICD-10-CM

## 2017-10-05 DIAGNOSIS — Q828 Other specified congenital malformations of skin: Secondary | ICD-10-CM

## 2017-10-05 NOTE — Progress Notes (Signed)
   Subjective:    Patient ID: [name], female    DOB: 10/11/1950, 67 y.o.   MRN: [id number]  HPI    Review of Systems  All other systems reviewed and are negative.      Objective:   Physical Exam        Assessment & Plan:

## 2017-10-22 DIAGNOSIS — K623 Rectal prolapse: Secondary | ICD-10-CM | POA: Diagnosis not present

## 2017-10-24 NOTE — Progress Notes (Signed)
Subjective:  Patient ID: [name], female    DOB: 1951/01/17,  MRN: [id number]  Chief Complaint  Patient presents with  . Callouses    Lesion: L 2nd toe (medial side) x months; 5/10 sharp pain -red and inflammed at the end of the day Tx: none    67 y.o. female presents with the above complaint.  Reports lesion to the left second toe for months.  5 out of 10 sharp pain red and inflamed at the end of the day. Past Medical History:  Diagnosis Date  . Anxiety   . Back pain, chronic   . Complication of anesthesia    PT HAS POST OP URINARY RETENTION:  per patient "difficult stick- needs central line or picc"  04-06-2017 AT [facility] IV STARTED BY CRNA NO ISSUE  . COPD with emphysema ([place])   . DDD (degenerative disc disease), cervical    POST MULTIPLE SURGERY'S  . DDD (degenerative disc disease), lumbosacral    POST MULTIPLE SURGERY'S  . Difficult intravenous access   . Fecal incontinence    once a week----  TREATED W/ SACRAL NERVE STIMULATOR PLACED 12/ 2018  . Heart murmur   . History of chronic bronchitis   . History of spinal cord injury 02/09/2007   PT FELL-- S/P  CERVICAL SPINE SURGERY  . History of urinary retention    PT HAS POST OP URINARY RETENTION DUE TO ANESTHESIA  . History of venomous snake bite 2013   RIGHT MIDDLE FINGER BY COOPERHEAD--- TREATMENT W/ ANTIVENOM  . Hyperlipemia   . IBS (irritable bowel syndrome)    flairs up occasionally esp if stressed  . Incomplete right bundle branch block   . Major depression   . Mild carotid artery disease [facility]) cardiologist-  dr Claiborne Billings   mild bilateral ICA per duplex 04-12-2017  1-39%  . MVP (mitral valve prolapse)    no evidence stenosis or regurgitation per last echo 10-25-2015  . OA (osteoarthritis)   . PONV (postoperative nausea and vomiting)   . Pre-diabetes   . PSVT (paroxysmal supraventricular tachycardia) [facility])    cardiologist-- dr Claiborne Billings   Past Surgical History:  Procedure Laterality Date  . ABDOMINAL RECTOPEXY   2011       dr fuller  [facility])   prolapse rectum  . ANAL RECTAL MANOMETRY N/A 03/01/2017   Procedure: ANO RECTAL MANOMETRY;  Surgeon: Leighton Ruff, MD;  Location: [facility];  Service: Endoscopy;  Laterality: N/A;  . ANTERIOR CERVICAL DECOMP/DISCECTOMY FUSION  05-28-2000    dr Louanne Skye   C3-4  &  C7-T1  . ANTERIOR CERVICAL DECOMPRESSION LAMINECTOMY AND FUSION  02-09-2007    dr Arnoldo Morale   decompression/ laminectomy C3-4 & C5-6;  laminectomy C2; fusion C2-5  . BREAST SURGERY  1976   breast reduction  . COLONOSCOPY    . FORAMINOTOMY 1 LEVEL  11-03-1999   dr Louanne Skye  [facility]   left C7-T1 w/ decompression left C8  . KNEE SURGERY  AGE 51   bone spur    . LUMBAR LAMINECTOMY/ DECOMPRESSION WITH MET-RX  02-09-2005  dr Flavia Shipper   L2-3 laminectomy left cage; fusion L2-4  . POSTERIOR CERVICAL FUSION/FORAMINOTOMY  12-08-2000    dr Louanne Skye   right forminotomy C7-T1 and posterior fusion  . RIGHT THUMB COLLATERAL LIGAMENT RECONSTRUCTION  02-08-2003   dr Amedeo Plenty  . RIGHT THUMB LIGAMENT REPAIR, ARTHROTOMY, SYNOVECTOMY  10-26-2002    dr Amedeo Plenty  . [place] to 01/2007   total 14 surgery's  cervical and  lumbar  (including fusion's, diskectomy, laminectomy's, foraminotomy's)  . TONSILLECTOMY    . TOTAL HIP ARTHROPLASTY Right 07/31/2014   Procedure: TOTAL HIP ARTHROPLASTY ANTERIOR APPROACH;  Surgeon: Renette Butters, MD;  Location: [place];  Service: Orthopedics;  Laterality: Right;  . TOTAL HIP ARTHROPLASTY Left 11/19/2015   Procedure: TOTAL HIP ARTHROPLASTY ANTERIOR APPROACH;  Surgeon: Renette Butters, MD;  Location: [place];  Service: Orthopedics;  Laterality: Left;  . TRANSTHORACIC ECHOCARDIOGRAM  10-25-2015   dr t. Claiborne Billings   ef 60-655/  no evidence MV stenosis or regurgitation/    . TUBAL LIGATION      Current Outpatient Medications:  .  aspirin EC 81 MG tablet, Take 81 mg by mouth at bedtime., Disp: , Rfl:  .  atenolol (TENORMIN) 25 MG tablet, Take 0.5 tablets (12.5 mg total) by mouth 2 (two) times  daily., Disp: 90 tablet, Rfl: 3 .  atorvastatin (LIPITOR) 10 MG tablet, Take 5 mg by mouth daily., Disp: , Rfl:  .  baclofen (LIORESAL) 20 MG tablet, Take 20 mg by mouth 3 (three) times daily as needed for muscle spasms. , Disp: , Rfl:  .  Calcium Carbonate-Vitamin D (CALCIUM + D PO), Take 1 tablet by mouth 2 (two) times daily. Calcium Magnesium Zinc and Vitamin D, Disp: , Rfl:  .  clonazePAM (KLONOPIN) 1 MG tablet, Take 1 mg by mouth at bedtime., Disp: , Rfl:  .  fish oil-omega-3 fatty acids 1000 MG capsule, Take 1 g by mouth daily. , Disp: , Rfl:  .  Multiple Vitamin (MULTIVITAMIN WITH MINERALS) TABS tablet, Take 1 tablet by mouth daily., Disp: , Rfl:  .  verapamil (CALAN-SR) 240 MG CR tablet, TAKE 1 TABLET BY MOUTH DAILY AT BEDTIME, Disp: 90 tablet, Rfl: 3  Allergies  Allergen Reactions  . Bupropion Hcl Swelling  . Celecoxib Swelling  . Naproxen Swelling  . Methadone Nausea And Vomiting  . Morphine Itching   Review of Systems: Negative except as noted in the HPI. Denies N/V/F/Ch. Objective:   Vitals:   10/05/17 1028  BP: (!) 109/58  Pulse: 79  Resp: 16  Temp: 97.8 F (36.6 C)   General AA&O x3. Normal mood and affect.  Vascular Dorsalis pedis and posterior tibial pulses  present 2+ bilaterally  Capillary refill normal to all digits. Pedal hair growth normal.  Neurologic Epicritic sensation grossly present.  Dermatologic No open lesions. Interspaces clear of maceration. Nails well groomed and normal in appearance. H PK left second toe PIPJ  Orthopedic: MMT 5/5 in dorsiflexion, plantarflexion, inversion, and eversion. Normal joint ROM without pain or crepitus.  Left second hammertoe with corn   Assessment & Plan:  Patient was evaluated and treated and all questions answered.  Hammertoe left second toe -X-rays taken reviewed consistent with above deformity -Corn pad dispensed -Callus pared with 15 blade.  No follow-ups on file.

## 2017-11-19 DIAGNOSIS — M961 Postlaminectomy syndrome, not elsewhere classified: Secondary | ICD-10-CM | POA: Diagnosis not present

## 2017-11-19 DIAGNOSIS — M4726 Other spondylosis with radiculopathy, lumbar region: Secondary | ICD-10-CM | POA: Diagnosis not present

## 2017-11-19 DIAGNOSIS — M4150 Other secondary scoliosis, site unspecified: Secondary | ICD-10-CM | POA: Diagnosis not present

## 2017-11-30 DIAGNOSIS — Z0000 Encounter for general adult medical examination without abnormal findings: Secondary | ICD-10-CM | POA: Diagnosis not present

## 2017-11-30 DIAGNOSIS — I6529 Occlusion and stenosis of unspecified carotid artery: Secondary | ICD-10-CM | POA: Diagnosis not present

## 2017-11-30 DIAGNOSIS — J449 Chronic obstructive pulmonary disease, unspecified: Secondary | ICD-10-CM | POA: Diagnosis not present

## 2017-11-30 DIAGNOSIS — M5030 Other cervical disc degeneration, unspecified cervical region: Secondary | ICD-10-CM | POA: Diagnosis not present

## 2017-11-30 DIAGNOSIS — R634 Abnormal weight loss: Secondary | ICD-10-CM | POA: Diagnosis not present

## 2017-11-30 DIAGNOSIS — F5101 Primary insomnia: Secondary | ICD-10-CM | POA: Diagnosis not present

## 2017-11-30 DIAGNOSIS — K589 Irritable bowel syndrome without diarrhea: Secondary | ICD-10-CM | POA: Diagnosis not present

## 2017-11-30 DIAGNOSIS — I471 Supraventricular tachycardia: Secondary | ICD-10-CM | POA: Diagnosis not present

## 2017-11-30 DIAGNOSIS — E782 Mixed hyperlipidemia: Secondary | ICD-10-CM | POA: Diagnosis not present

## 2017-11-30 DIAGNOSIS — Z79899 Other long term (current) drug therapy: Secondary | ICD-10-CM | POA: Diagnosis not present

## 2017-11-30 DIAGNOSIS — M150 Primary generalized (osteo)arthritis: Secondary | ICD-10-CM | POA: Diagnosis not present

## 2017-11-30 DIAGNOSIS — R7303 Prediabetes: Secondary | ICD-10-CM | POA: Diagnosis not present

## 2017-11-30 DIAGNOSIS — M5136 Other intervertebral disc degeneration, lumbar region: Secondary | ICD-10-CM | POA: Diagnosis not present

## 2017-12-06 DIAGNOSIS — M62838 Other muscle spasm: Secondary | ICD-10-CM | POA: Diagnosis not present

## 2017-12-06 DIAGNOSIS — G894 Chronic pain syndrome: Secondary | ICD-10-CM | POA: Diagnosis not present

## 2017-12-06 DIAGNOSIS — G4700 Insomnia, unspecified: Secondary | ICD-10-CM | POA: Diagnosis not present

## 2017-12-06 DIAGNOSIS — M961 Postlaminectomy syndrome, not elsewhere classified: Secondary | ICD-10-CM | POA: Diagnosis not present

## 2017-12-07 DIAGNOSIS — R339 Retention of urine, unspecified: Secondary | ICD-10-CM | POA: Diagnosis not present

## 2017-12-07 DIAGNOSIS — N390 Urinary tract infection, site not specified: Secondary | ICD-10-CM | POA: Diagnosis not present

## 2018-01-06 DIAGNOSIS — R634 Abnormal weight loss: Secondary | ICD-10-CM | POA: Diagnosis not present

## 2018-01-06 DIAGNOSIS — R6881 Early satiety: Secondary | ICD-10-CM | POA: Diagnosis not present

## 2018-01-18 DIAGNOSIS — K209 Esophagitis, unspecified: Secondary | ICD-10-CM | POA: Diagnosis not present

## 2018-01-18 DIAGNOSIS — K648 Other hemorrhoids: Secondary | ICD-10-CM | POA: Diagnosis not present

## 2018-01-18 DIAGNOSIS — D120 Benign neoplasm of cecum: Secondary | ICD-10-CM | POA: Diagnosis not present

## 2018-01-18 DIAGNOSIS — R634 Abnormal weight loss: Secondary | ICD-10-CM | POA: Diagnosis not present

## 2018-01-18 DIAGNOSIS — Z980 Intestinal bypass and anastomosis status: Secondary | ICD-10-CM | POA: Diagnosis not present

## 2018-01-18 DIAGNOSIS — B3781 Candidal esophagitis: Secondary | ICD-10-CM | POA: Diagnosis not present

## 2018-01-18 DIAGNOSIS — R197 Diarrhea, unspecified: Secondary | ICD-10-CM | POA: Diagnosis not present

## 2018-01-19 DIAGNOSIS — Z1389 Encounter for screening for other disorder: Secondary | ICD-10-CM | POA: Diagnosis not present

## 2018-01-19 DIAGNOSIS — Z124 Encounter for screening for malignant neoplasm of cervix: Secondary | ICD-10-CM | POA: Diagnosis not present

## 2018-01-19 DIAGNOSIS — Z130 Encounter for screening for diseases of the blood and blood-forming organs and certain disorders involving the immune mechanism: Secondary | ICD-10-CM | POA: Diagnosis not present

## 2018-01-19 DIAGNOSIS — Z1231 Encounter for screening mammogram for malignant neoplasm of breast: Secondary | ICD-10-CM | POA: Diagnosis not present

## 2018-01-19 DIAGNOSIS — Z01419 Encounter for gynecological examination (general) (routine) without abnormal findings: Secondary | ICD-10-CM | POA: Diagnosis not present

## 2018-01-27 DIAGNOSIS — R6881 Early satiety: Secondary | ICD-10-CM | POA: Diagnosis not present

## 2018-01-27 DIAGNOSIS — B3781 Candidal esophagitis: Secondary | ICD-10-CM | POA: Diagnosis not present

## 2018-01-27 DIAGNOSIS — K257 Chronic gastric ulcer without hemorrhage or perforation: Secondary | ICD-10-CM | POA: Diagnosis not present

## 2018-01-27 DIAGNOSIS — R634 Abnormal weight loss: Secondary | ICD-10-CM | POA: Diagnosis not present

## 2018-01-27 DIAGNOSIS — Z23 Encounter for immunization: Secondary | ICD-10-CM | POA: Diagnosis not present

## 2018-01-28 DIAGNOSIS — R634 Abnormal weight loss: Secondary | ICD-10-CM | POA: Diagnosis not present

## 2018-01-28 DIAGNOSIS — D7389 Other diseases of spleen: Secondary | ICD-10-CM | POA: Diagnosis not present

## 2018-02-21 DIAGNOSIS — R300 Dysuria: Secondary | ICD-10-CM | POA: Diagnosis not present

## 2018-02-21 DIAGNOSIS — B373 Candidiasis of vulva and vagina: Secondary | ICD-10-CM | POA: Diagnosis not present

## 2018-02-21 DIAGNOSIS — R339 Retention of urine, unspecified: Secondary | ICD-10-CM | POA: Diagnosis not present

## 2018-02-21 DIAGNOSIS — N390 Urinary tract infection, site not specified: Secondary | ICD-10-CM | POA: Diagnosis not present

## 2018-03-07 DIAGNOSIS — G894 Chronic pain syndrome: Secondary | ICD-10-CM | POA: Diagnosis not present

## 2018-03-07 DIAGNOSIS — M62838 Other muscle spasm: Secondary | ICD-10-CM | POA: Diagnosis not present

## 2018-03-07 DIAGNOSIS — G4700 Insomnia, unspecified: Secondary | ICD-10-CM | POA: Diagnosis not present

## 2018-03-07 DIAGNOSIS — M961 Postlaminectomy syndrome, not elsewhere classified: Secondary | ICD-10-CM | POA: Diagnosis not present

## 2018-03-29 DIAGNOSIS — R634 Abnormal weight loss: Secondary | ICD-10-CM | POA: Diagnosis not present

## 2018-03-29 DIAGNOSIS — K257 Chronic gastric ulcer without hemorrhage or perforation: Secondary | ICD-10-CM | POA: Diagnosis not present

## 2018-03-29 DIAGNOSIS — Z23 Encounter for immunization: Secondary | ICD-10-CM | POA: Diagnosis not present

## 2018-03-29 DIAGNOSIS — J449 Chronic obstructive pulmonary disease, unspecified: Secondary | ICD-10-CM | POA: Diagnosis not present

## 2018-03-29 DIAGNOSIS — F17200 Nicotine dependence, unspecified, uncomplicated: Secondary | ICD-10-CM | POA: Diagnosis not present

## 2018-03-29 DIAGNOSIS — D739 Disease of spleen, unspecified: Secondary | ICD-10-CM | POA: Diagnosis not present

## 2018-04-28 ENCOUNTER — Ambulatory Visit (HOSPITAL_COMMUNITY)
Admission: RE | Admit: 2018-04-28 | Discharge: 2018-04-28 | Disposition: A | Discharge status: Home / Self Care | Payer: Medicare Other | Source: Ambulatory Visit | Attending: Cardiology | Admitting: Cardiology

## 2018-04-28 DIAGNOSIS — I739 Peripheral vascular disease, unspecified: Secondary | ICD-10-CM | POA: Insufficient documentation

## 2018-04-28 DIAGNOSIS — I779 Disorder of arteries and arterioles, unspecified: Secondary | ICD-10-CM

## 2018-06-09 ENCOUNTER — Telehealth: Payer: Self-pay | Admitting: Cardiovascular Disease

## 2018-06-09 MED ORDER — VERAPAMIL HCL ER 240 MG PO TBCR: EXTENDED_RELEASE_TABLET | ORAL | 3 refills | Status: DC

## 2018-06-09 MED ORDER — ATENOLOL 25 MG PO TABS: 12.5000 mg | ORAL_TABLET | Freq: Two times a day (BID) | ORAL | 3 refills | Status: DC

## 2018-06-09 NOTE — Telephone Encounter (Signed)
Received in coming fax from [facility] requesting refills for Atenolol 25 and Verapamil 240. Rx request sent to pharmacy.

## 2018-09-02 ENCOUNTER — Other Ambulatory Visit: Payer: Self-pay

## 2018-09-02 MED ORDER — ATORVASTATIN CALCIUM 10 MG PO TABS: 5.0000 mg | ORAL_TABLET | Freq: Every day | ORAL | 1 refills | Status: DC

## 2018-10-18 ENCOUNTER — Other Ambulatory Visit: Payer: Self-pay | Admitting: *Deleted

## 2018-10-18 DIAGNOSIS — Z122 Encounter for screening for malignant neoplasm of respiratory organs: Secondary | ICD-10-CM

## 2018-10-18 DIAGNOSIS — Z87891 Personal history of nicotine dependence: Secondary | ICD-10-CM

## 2018-10-26 ENCOUNTER — Telehealth: Payer: Self-pay | Admitting: Physician Assistant

## 2018-10-26 NOTE — Telephone Encounter (Signed)

## 2018-10-27 ENCOUNTER — Ambulatory Visit (INDEPENDENT_AMBULATORY_CARE_PROVIDER_SITE_OTHER): Payer: BC Managed Care – PPO | Admitting: Physician Assistant

## 2018-10-27 ENCOUNTER — Other Ambulatory Visit: Payer: Self-pay

## 2018-10-27 ENCOUNTER — Encounter: Payer: Self-pay | Admitting: Physician Assistant

## 2018-10-27 ENCOUNTER — Encounter: Payer: Self-pay | Admitting: Cardiovascular Disease

## 2018-10-27 VITALS — BP 132/71 | HR 68 | Ht 67.0 in | Wt 100.2 lb

## 2018-10-27 DIAGNOSIS — R079 Chest pain, unspecified: Secondary | ICD-10-CM

## 2018-10-27 DIAGNOSIS — Z87891 Personal history of nicotine dependence: Secondary | ICD-10-CM

## 2018-10-27 DIAGNOSIS — M7989 Other specified soft tissue disorders: Secondary | ICD-10-CM

## 2018-10-27 DIAGNOSIS — R0789 Other chest pain: Secondary | ICD-10-CM

## 2018-10-27 DIAGNOSIS — I471 Supraventricular tachycardia, unspecified: Secondary | ICD-10-CM

## 2018-10-27 DIAGNOSIS — E785 Hyperlipidemia, unspecified: Secondary | ICD-10-CM

## 2018-10-27 DIAGNOSIS — R7303 Prediabetes: Secondary | ICD-10-CM

## 2018-10-27 MED ORDER — ATORVASTATIN CALCIUM 10 MG PO TABS: 10.0000 mg | ORAL_TABLET | Freq: Every day | ORAL | 1 refills | Status: DC

## 2018-10-27 NOTE — Patient Instructions (Addendum)
Medication Instructions:   INCREASE LIPITOR to 10 Mg daily  If you need a refill on your cardiac medications before your next appointment, please call your pharmacy.   Lab work: You will need to have labs (blood work) drawn by your PCP in 3 months:  Fasting Lipid-Do not eat or drink past midnight. May drink water  Liver Function  If you have labs (blood work) drawn today and your tests are completely normal, you will receive your results only by: Marland Kitchen MyChart Message (if you have MyChart) OR . A paper copy in the mail If you have any lab test that is abnormal or we need to change your treatment, we will call you to review the results.  Testing/Procedures: Your physician has requested that you have a lexiscan myoview. For further information please visit [email or web address]. Please follow instruction sheet, as given.  Your physician has requested that you have a lower or upper extremity venous duplex. This test is an ultrasound of the veins in the legs or arms. It looks at venous blood flow that carries blood from the heart to the legs or arms. Allow one hour for a Lower Venous exam. Allow thirty minutes for an Upper Venous exam. There are no restrictions or special instructions.  Follow-Up: At [facility], you and your health needs are our priority.  As part of our continuing mission to provide you with exceptional heart care, we have created designated Provider Care Teams.  These Care Teams include your primary Cardiologist (physician) and Advanced Practice Providers (APPs -  Physician Assistants and Nurse Practitioners) who all work together to provide you with the care you need, when you need it. You will need a follow up appointment in 3-4 months.  Please call our office 2 months in advance to schedule this appointment.  You may see Shelva Majestic, MD or one of the following Advanced Practice Providers on your designated Care Team: [place], [place] . Fabian Sharp, PA-C  Any Other Special  Instructions Will Be Listed Below (If Applicable).

## 2018-10-27 NOTE — Progress Notes (Signed)
Cardiology Office Note    Date:  10/29/2018   ID:  HEDWIG MCFALL, DOB 1950/05/31, MRN [id number]  PCP:  Raina Mina., MD  Cardiologist:  Dr. Claiborne Billings  Chief Complaint  Patient presents with   Follow-up    seen for Dr. Claiborne Billings.     History of Present Illness:  [name] is a 68 y.o. female with PMH of supraventricular tachycardia, heart murmur, COPD, mild carotid artery disease, mitral valve prolapse, hyperlipidemia, and prediabetes.  Echocardiogram obtained in 2017 showed EF of 60 to 65%, normal wall motion, no significant valve issue.  Patient was last seen by Dr. Claiborne Billings in May 2019.  A low-dose lung cancer CT screening was ordered.  Due to this showed benign appearance and I recommend a repeat low-dose chest CT without contrast in 12 months.  There was aortic atherosclerosis in addition to left main and two-vessel CAD noted on the CT scan.  She also had mild diffuse bronchial wall thickening with mild central lobar and paraseptal emphysema suggestive of underlying CAD.  Carotid Doppler obtained in January 2020 showed mild disease bilaterally, patent vertebral artery.  Patient presents today for cardiology office visit.  For the past week, she has been having some intermittent chest pain.  She described as a left-sided dull ache that lasts anywhere between 30 seconds up to 15 minutes.  It only occurs at rest and it does not occur when she ambulated 2 miles on a daily basis.  There is no exacerbating factors such as deep inspiration, body rotation palpation.  I recommend a Lexiscan Myoview.  She says several weeks ago, when she returned from outside work, she noticed her left lower extremity was quite swollen and also appears to be red as well.  On physical exam, I do not see any sign of cellulitis.  I will obtain a venous Doppler to rule out potential DVT however risk of DVT is very low at this time.  Her most recent lipid panel obtained by [facility] showed uncontrolled LDL, LDL goal for  her should be less than 70.  I will increase her Lipitor to 10 mg daily.  Her primary care provider can obtain fasting lipid panel and LFT in 3 months.  Past Medical History:  Diagnosis Date   Anxiety    Back pain, chronic    Complication of anesthesia    PT HAS POST OP URINARY RETENTION:  per patient "difficult stick- needs central line or picc"  04-06-2017 AT [facility] IV STARTED BY CRNA NO ISSUE   COPD with emphysema ([place])    DDD (degenerative disc disease), cervical    POST MULTIPLE SURGERY'S   DDD (degenerative disc disease), lumbosacral    POST MULTIPLE SURGERY'S   Difficult intravenous access    Fecal incontinence    once a week----  TREATED W/ SACRAL NERVE STIMULATOR PLACED 12/ 2018   Heart murmur    History of chronic bronchitis    History of spinal cord injury 02/09/2007   PT FELL-- S/P  CERVICAL SPINE SURGERY   History of urinary retention    PT HAS POST OP URINARY RETENTION DUE TO ANESTHESIA   History of venomous snake bite 2013   RIGHT MIDDLE FINGER BY COOPERHEAD--- TREATMENT W/ ANTIVENOM   Hyperlipemia    IBS (irritable bowel syndrome)    flairs up occasionally esp if stressed   Incomplete right bundle branch block    Major depression    Mild carotid artery disease [facility]) cardiologist-  dr Claiborne Billings   mild bilateral ICA per duplex 04-12-2017  1-39%   MVP (mitral valve prolapse)    no evidence stenosis or regurgitation per last echo 10-25-2015   OA (osteoarthritis)    PONV (postoperative nausea and vomiting)    Pre-diabetes    PSVT (paroxysmal supraventricular tachycardia) [facility])    cardiologist-- dr Claiborne Billings    Past Surgical History:  Procedure Laterality Date   ABDOMINAL RECTOPEXY  2011       dr fuller  [facility])   prolapse rectum   ANAL RECTAL MANOMETRY N/A 03/01/2017   Procedure: ANO RECTAL MANOMETRY;  Surgeon: Leighton Ruff, MD;  [facility];  Service: Endoscopy;  Laterality: N/A;   ANTERIOR CERVICAL DECOMP/DISCECTOMY FUSION   05-28-2000    dr Louanne Skye   C3-4  &  C7-T1   ANTERIOR CERVICAL DECOMPRESSION LAMINECTOMY AND FUSION  02-09-2007    dr Arnoldo Morale   decompression/ laminectomy C3-4 & C5-6;  laminectomy C2; fusion C2-5   BREAST SURGERY  1976   breast reduction   COLONOSCOPY     FORAMINOTOMY 1 LEVEL  11-03-1999   dr Louanne Skye  [facility]   left C7-T1 w/ decompression left C8   KNEE SURGERY  AGE 41   bone spur     LUMBAR LAMINECTOMY/ DECOMPRESSION WITH MET-RX  02-09-2005  dr Flavia Shipper   L2-3 laminectomy left cage; fusion L2-4   POSTERIOR CERVICAL FUSION/FORAMINOTOMY  12-08-2000    dr Louanne Skye   right forminotomy C7-T1 and posterior fusion   RIGHT THUMB COLLATERAL LIGAMENT RECONSTRUCTION  02-08-2003   dr Amedeo Plenty   RIGHT THUMB LIGAMENT REPAIR, ARTHROTOMY, SYNOVECTOMY  10-26-2002    dr Amedeo Plenty   SPINE SURGERY  1983 to 01/2007   total 14 surgery's  cervical and lumbar  (including fusion's, diskectomy, laminectomy's, foraminotomy's)   TONSILLECTOMY     TOTAL HIP ARTHROPLASTY Right 07/31/2014   Procedure: TOTAL HIP ARTHROPLASTY ANTERIOR APPROACH;  Surgeon: Renette Butters, MD;  Location: [place];  Service: Orthopedics;  Laterality: Right;   TOTAL HIP ARTHROPLASTY Left 11/19/2015   Procedure: TOTAL HIP ARTHROPLASTY ANTERIOR APPROACH;  Surgeon: Renette Butters, MD;  Location: [place];  Service: Orthopedics;  Laterality: Left;   TRANSTHORACIC ECHOCARDIOGRAM  10-25-2015   dr t. Claiborne Billings   ef 60-655/  no evidence MV stenosis or regurgitation/     TUBAL LIGATION      Current Medications: Outpatient Medications Prior to Visit  Medication Sig Dispense Refill   aspirin EC 81 MG tablet Take 81 mg by mouth at bedtime.     atenolol (TENORMIN) 25 MG tablet Take 0.5 tablets (12.5 mg total) by mouth 2 (two) times daily. 90 tablet 3   baclofen (LIORESAL) 20 MG tablet Take 20 mg by mouth 3 (three) times daily as needed for muscle spasms.      Calcium Carbonate-Vitamin D (CALCIUM + D PO) Take 1 tablet by mouth 2 (two) times daily. Calcium  Magnesium Zinc and Vitamin D     clonazePAM (KLONOPIN) 1 MG tablet Take 1 mg by mouth at bedtime.     fish oil-omega-3 fatty acids 1000 MG capsule Take 1 g by mouth daily.      Multiple Vitamin (MULTIVITAMIN WITH MINERALS) TABS tablet Take 1 tablet by mouth daily.     verapamil (CALAN-SR) 240 MG CR tablet TAKE 1 TABLET BY MOUTH DAILY AT BEDTIME 90 tablet 3   atorvastatin (LIPITOR) 10 MG tablet Take 0.5 tablets (5 mg total) by mouth daily. 90 tablet 1   No facility-administered  medications prior to visit.      Allergies:   Bupropion hcl, Celecoxib, Naproxen, Methadone, and Morphine   Social History   Socioeconomic History   Marital status: Married    Spouse name: Not on file   Number of children: Not on file   Years of education: Not on file   Highest education level: Not on file  Occupational History   Not on file  Social Needs   Financial resource strain: Not on file   Food insecurity    Worry: Not on file    Inability: Not on file   Transportation needs    Medical: Not on file    Non-medical: Not on file  Tobacco Use   Smoking status: Former Smoker    Packs/day: 0.75    Years: 42.00    Pack years: 31.50    Types: Cigarettes    Quit date: 08/08/2017    Years since quitting: 1.2   Smokeless tobacco: Never Used  Substance and Sexual Activity   Alcohol use: Yes    Alcohol/week: 5.0 - 6.0 standard drinks    Types: 5 - 6 Glasses of wine per week    Comment: occasional   Drug use: No   Sexual activity: Yes  Lifestyle   Physical activity    Days per week: Not on file    Minutes per session: Not on file   Stress: Not on file  Relationships   Social connections    Talks on phone: Not on file    Gets together: Not on file    Attends religious service: Not on file    Active member of club or organization: Not on file    Attends meetings of clubs or organizations: Not on file    Relationship status: Not on file  Other Topics Concern   Not on file    Social History Narrative   Not on file     Family History:  The patient's family history includes COPD in her father; Diabetes in her mother; Heart disease in her mother; Hypertension in her mother; Kidney disease in her mother.   ROS:   Please see the history of present illness.    ROS All other systems reviewed and are negative.   PHYSICAL EXAM:   VS:  BP 132/71    Pulse 68    Ht 5' 7" (1.702 m)    Wt 100 lb 3.2 oz (45.5 kg)    BMI 15.69 kg/m    GEN: Well nourished, well developed, in no acute distress  HEENT: normal  Neck: no JVD, carotid bruits, or masses Cardiac: RRR; no murmurs, rubs, or gallops,no edema  Respiratory:  clear to auscultation bilaterally, normal work of breathing GI: soft, nontender, nondistended, + BS MS: no deformity or atrophy  Skin: warm and dry, no rash Neuro:  Alert and Oriented x 3, Strength and sensation are intact Psych: euthymic mood, full affect  Wt Readings from Last 3 Encounters:  10/27/18 100 lb 3.2 oz (45.5 kg)  10/05/17 105 lb (47.6 kg)  09/07/17 102 lb 3.2 oz (46.4 kg)      Studies/Labs Reviewed:   EKG:  EKG is ordered today.  The ekg ordered today demonstrates NSR with Q wave in the anterior leads.   Recent Labs: No results found for requested labs within last 8760 hours.   Lipid Panel    Component Value Date/Time   CHOL 146 11/03/2016 0920   TRIG 111 11/03/2016 0920   HDL 46 11/03/2016  0920   CHOLHDL 3.2 11/03/2016 0920   CHOLHDL 3.3 02/28/2015 1023   VLDL 30 02/28/2015 1023   LDLCALC 78 11/03/2016 0920    Additional studies/ records that were reviewed today include:   Echo 10/25/2015 LV EF: 60% -   65% Study Conclusions  - Left ventricle: The cavity size was normal. Wall thickness was   normal. Systolic function was normal. The estimated ejection   fraction was in the range of 60% to 65%. Wall motion was normal;   there were no regional wall motion abnormalities.  Impressions:  - Normal LV systolic function;  no significant valvular disease.    ASSESSMENT:    1. Chest pain of uncertain etiology   2. Leg swelling   3. SVT (supraventricular tachycardia) ([place])   4. Hyperlipidemia LDL goal <70   5. Prediabetes      PLAN:  In order of problems listed above:  1. Chest pain: Recent CT of the chest obtained on 09/22/2017 demonstrated two-vessel coronary artery disease.  Given intermittent chest pain recently, will obtain Lexiscan Myoview  2. Ipsilateral leg swelling: Will obtain venous Doppler  3. History of SVT: No recent recurrence  4. Hyperlipidemia: Continue Lipitor 10 mg daily  5. Prediabetes: Managed by primary care provider  6. Tobacco abuse: Unfortunately, patient restarted smoking recently.  Pending repeat CT of chest to screen for lung cancer    Medication Adjustments/Labs and Tests Ordered: Current medicines are reviewed at length with the patient today.  Concerns regarding medicines are outlined above.  Medication changes, Labs and Tests ordered today are listed in the Patient Instructions below. Patient Instructions  Medication Instructions:   INCREASE LIPITOR to 10 Mg daily  If you need a refill on your cardiac medications before your next appointment, please call your pharmacy.   Lab work: You will need to have labs (blood work) drawn by your PCP in 3 months:  Fasting Lipid-Do not eat or drink past midnight. May drink water  Liver Function  If you have labs (blood work) drawn today and your tests are completely normal, you will receive your results only by:  MyChart Message (if you have MyChart) OR  A paper copy in the mail If you have any lab test that is abnormal or we need to change your treatment, we will call you to review the results.  Testing/Procedures: Your physician has requested that you have a lexiscan myoview. For further information please visit [email or web address]. Please follow instruction sheet, as given.  Your physician has requested that  you have a lower or upper extremity venous duplex. This test is an ultrasound of the veins in the legs or arms. It looks at venous blood flow that carries blood from the heart to the legs or arms. Allow one hour for a Lower Venous exam. Allow thirty minutes for an Upper Venous exam. There are no restrictions or special instructions.  Follow-Up: At [facility], you and your health needs are our priority.  As part of our continuing mission to provide you with exceptional heart care, we have created designated Provider Care Teams.  These Care Teams include your primary Cardiologist (physician) and Advanced Practice Providers (APPs -  Physician Assistants and Nurse Practitioners) who all work together to provide you with the care you need, when you need it. You will need a follow up appointment in 3-4 months.  Please call our office 2 months in advance to schedule this appointment.  You may see Shelva Majestic, MD or one of  the following Advanced Practice Providers on your designated Care Team: Almyra Deforest, [place]  Fabian Sharp, [place]  Any Other Special Instructions Will Be Listed Below (If Applicable).       Hilbert Corrigan, [place]  10/29/2018 12:04 PM    [facility] [address], [place], [place]  [address] Phone: [phone]; Fax: [phone]

## 2018-10-29 ENCOUNTER — Encounter: Payer: Self-pay | Admitting: Physician Assistant

## 2018-11-18 ENCOUNTER — Telehealth (HOSPITAL_COMMUNITY): Payer: Self-pay

## 2018-11-18 NOTE — Telephone Encounter (Signed)
Encounter complete. 

## 2018-11-23 ENCOUNTER — Other Ambulatory Visit: Payer: Self-pay

## 2018-11-23 ENCOUNTER — Inpatient Hospital Stay (HOSPITAL_COMMUNITY): Admission: RE | Admit: 2018-11-23 | Payer: BC Managed Care – PPO | Source: Ambulatory Visit

## 2018-11-23 ENCOUNTER — Ambulatory Visit (HOSPITAL_COMMUNITY)
Admission: RE | Admit: 2018-11-23 | Discharge: 2018-11-23 | Disposition: A | Discharge status: Home / Self Care | Payer: BC Managed Care – PPO | Source: Ambulatory Visit | Attending: Internal Medicine | Admitting: Internal Medicine

## 2018-11-23 DIAGNOSIS — R0789 Other chest pain: Secondary | ICD-10-CM

## 2018-11-23 DIAGNOSIS — R079 Chest pain, unspecified: Secondary | ICD-10-CM

## 2018-11-23 LAB — MYOCARDIAL PERFUSION IMAGING
LV dias vol: 71 mL (ref 46–106)
LV sys vol: 24 mL
Peak HR: 89 {beats}/min
Rest HR: 51 {beats}/min
SDS: 5
SRS: 1
SSS: 6
TID: 1.14

## 2018-11-23 MED ORDER — TECHNETIUM TC 99M TETROFOSMIN IV KIT
9.6000 | PACK | Freq: Once | INTRAVENOUS | Status: AC | PRN
  Administered 2018-11-23: 9.6 via INTRAVENOUS
  Filled 2018-11-23: qty 10

## 2018-11-23 MED ORDER — TECHNETIUM TC 99M TETROFOSMIN IV KIT
28.7000 | PACK | Freq: Once | INTRAVENOUS | Status: AC | PRN
  Administered 2018-11-23: 28.7 via INTRAVENOUS
  Filled 2018-11-23: qty 29

## 2018-11-23 MED ORDER — REGADENOSON 0.4 MG/5ML IV SOLN
0.4000 mg | Freq: Once | INTRAVENOUS | Status: AC
  Administered 2018-11-23: 0.4 mg via INTRAVENOUS

## 2018-11-24 NOTE — Progress Notes (Signed)
Despite previous CT of chest showed coronary calcification, this stress test shows none of the coronary calcification is significant. Normal blood flow to the heart, no reversible blockage. Normal pumping function

## 2018-11-28 ENCOUNTER — Telehealth: Payer: Self-pay

## 2018-11-28 NOTE — Telephone Encounter (Signed)
Patient is returning call.  °

## 2018-11-28 NOTE — Telephone Encounter (Signed)
Returned call to patient to give her the results and recommendations of her Lexiscan/Myoview. She verbalized understanding. All questions (if any) were answered.

## 2018-11-28 NOTE — Progress Notes (Signed)
The patient has been notified of the result and verbalized understanding.  All questions (if any) were answered. Jacqulynn Cadet, CMA 11/28/2018 12:35 PM

## 2018-11-28 NOTE — Telephone Encounter (Addendum)
Left a message for the patient to call back for results of Lexi/Myoview.  ----- Message from [place], [place] sent at 11/24/2018  8:43 AM EDT ----- Despite previous CT of chest showed coronary calcification, this stress test shows none of the coronary calcification is significant. Normal blood flow to the heart, no reversible blockage. Normal pumping function

## 2018-12-07 ENCOUNTER — Other Ambulatory Visit: Payer: Self-pay

## 2018-12-07 ENCOUNTER — Ambulatory Visit (INDEPENDENT_AMBULATORY_CARE_PROVIDER_SITE_OTHER)
Admission: RE | Admit: 2018-12-07 | Discharge: 2018-12-07 | Disposition: A | Discharge status: Home / Self Care | Payer: BC Managed Care – PPO | Source: Ambulatory Visit | Attending: Acute Care | Admitting: Acute Care

## 2018-12-07 DIAGNOSIS — Z122 Encounter for screening for malignant neoplasm of respiratory organs: Secondary | ICD-10-CM

## 2018-12-07 DIAGNOSIS — Z87891 Personal history of nicotine dependence: Secondary | ICD-10-CM | POA: Diagnosis not present

## 2018-12-12 ENCOUNTER — Other Ambulatory Visit: Payer: Self-pay | Admitting: *Deleted

## 2018-12-12 DIAGNOSIS — Z122 Encounter for screening for malignant neoplasm of respiratory organs: Secondary | ICD-10-CM

## 2018-12-12 DIAGNOSIS — Z87891 Personal history of nicotine dependence: Secondary | ICD-10-CM

## 2019-01-03 ENCOUNTER — Emergency Department (HOSPITAL_COMMUNITY)
Admission: EM | Admit: 2019-01-03 | Discharge: 2019-01-04 | Disposition: A | Discharge status: Home / Self Care | Payer: BC Managed Care – PPO | Attending: Emergency Medicine | Admitting: Emergency Medicine

## 2019-01-03 ENCOUNTER — Other Ambulatory Visit: Payer: Self-pay

## 2019-01-03 DIAGNOSIS — Z79899 Other long term (current) drug therapy: Secondary | ICD-10-CM | POA: Insufficient documentation

## 2019-01-03 DIAGNOSIS — Z7982 Long term (current) use of aspirin: Secondary | ICD-10-CM | POA: Insufficient documentation

## 2019-01-03 DIAGNOSIS — Z8673 Personal history of transient ischemic attack (TIA), and cerebral infarction without residual deficits: Secondary | ICD-10-CM | POA: Diagnosis not present

## 2019-01-03 DIAGNOSIS — J449 Chronic obstructive pulmonary disease, unspecified: Secondary | ICD-10-CM | POA: Insufficient documentation

## 2019-01-03 DIAGNOSIS — K529 Noninfective gastroenteritis and colitis, unspecified: Secondary | ICD-10-CM | POA: Diagnosis not present

## 2019-01-03 DIAGNOSIS — R8281 Pyuria: Secondary | ICD-10-CM | POA: Insufficient documentation

## 2019-01-03 DIAGNOSIS — Z96643 Presence of artificial hip joint, bilateral: Secondary | ICD-10-CM | POA: Insufficient documentation

## 2019-01-03 DIAGNOSIS — R197 Diarrhea, unspecified: Secondary | ICD-10-CM

## 2019-01-03 DIAGNOSIS — R112 Nausea with vomiting, unspecified: Secondary | ICD-10-CM | POA: Diagnosis present

## 2019-01-03 DIAGNOSIS — Z87891 Personal history of nicotine dependence: Secondary | ICD-10-CM | POA: Insufficient documentation

## 2019-01-03 DIAGNOSIS — E860 Dehydration: Secondary | ICD-10-CM

## 2019-01-03 LAB — COMPREHENSIVE METABOLIC PANEL
ALT: 25 U/L (ref 0–44)
AST: 28 U/L (ref 15–41)
Albumin: 4 g/dL (ref 3.5–5.0)
Alkaline Phosphatase: 86 U/L (ref 38–126)
Anion gap: 14 (ref 5–15)
BUN: 37 mg/dL — ABNORMAL HIGH (ref 8–23)
CO2: 29 mmol/L (ref 22–32)
Calcium: 9.8 mg/dL (ref 8.9–10.3)
Chloride: 95 mmol/L — ABNORMAL LOW (ref 98–111)
Creatinine, Ser: 1.06 mg/dL — ABNORMAL HIGH (ref 0.44–1.00)
GFR calc Af Amer: >60 mL/min (ref >60)
GFR calc non Af Amer: 55 mL/min — ABNORMAL LOW (ref >60)
Glucose, Bld: 113 mg/dL — ABNORMAL HIGH (ref 70–99)
Potassium: 3.7 mmol/L (ref 3.5–5.1)
Sodium: 138 mmol/L (ref 135–145)
Total Bilirubin: 1.4 mg/dL — ABNORMAL HIGH (ref 0.3–1.2)
Total Protein: 7 g/dL (ref 6.5–8.1)

## 2019-01-03 LAB — CBC
HCT: 52.4 % — ABNORMAL HIGH (ref 36.0–46.0)
Hemoglobin: 17.6 g/dL — ABNORMAL HIGH (ref 12.0–15.0)
MCH: 33.5 pg (ref 26.0–34.0)
MCHC: 33.6 g/dL (ref 30.0–36.0)
MCV: 99.6 fL (ref 80.0–100.0)
Platelets: 211 10*3/uL (ref 150–400)
RBC: 5.26 MIL/uL — ABNORMAL HIGH (ref 3.87–5.11)
RDW: 12.4 % (ref 11.5–15.5)
WBC: 9.7 10*3/uL (ref 4.0–10.5)
nRBC: 0 % (ref 0.0–0.2)

## 2019-01-03 LAB — LIPASE, BLOOD: Lipase: 37 U/L (ref 11–51)

## 2019-01-03 MED ORDER — ONDANSETRON 4 MG PO TBDP
4.0000 mg | ORAL_TABLET | Freq: Once | ORAL | Status: AC | PRN
  Administered 2019-01-03: 15:00:00 4 mg via ORAL
  Filled 2019-01-03: qty 1

## 2019-01-03 MED ORDER — SODIUM CHLORIDE 0.9 % IV BOLUS
1000.0000 mL | Freq: Once | INTRAVENOUS | Status: AC
  Administered 2019-01-03: 1000 mL via INTRAVENOUS

## 2019-01-03 MED ORDER — SODIUM CHLORIDE 0.9% FLUSH: 3.0000 mL | Freq: Once | INTRAVENOUS | Status: DC

## 2019-01-03 MED ORDER — ONDANSETRON HCL 4 MG/2ML IJ SOLN
4.0000 mg | Freq: Once | INTRAMUSCULAR | Status: AC
  Administered 2019-01-03: 4 mg via INTRAVENOUS
  Filled 2019-01-03: qty 2

## 2019-01-03 NOTE — ED Triage Notes (Signed)
Patient reports N/V/D x 2 days, feels like abdomen is "raw" from throwing up so much. States she got a pizza on Sunday and afterward, became sick, so feels it may be food poisoning. Unable to keep anything down, including water. Took Dramamine PTA with some relief.

## 2019-01-03 NOTE — ED Provider Notes (Signed)
[place] EMERGENCY DEPARTMENT Provider Note   CSN: [id number] Arrival date & time: 01/03/19  1400     History   Chief Complaint Chief Complaint  Patient presents with  . Emesis  . Diarrhea    HPI [name] is a 68 y.o. female.  She is complaining of feeling like she got food poisoning.  She said she started with vomiting and diarrhea on Sunday and has not been able to hold anything down much since then.  There is been no blood from above or below.  Said her abdomen feels sore but not really pain.  Last urination was this morning.  No fever.  No sick contacts.  Prior surgical history was a tubal ligation only.     The history is provided by the patient.  Emesis Severity:  Severe Duration:  2 days Timing:  Intermittent Quality:  Bilious material Progression:  Unchanged Chronicity:  New Recent urination:  Decreased Context: not post-tussive and not self-induced   Relieved by:  Nothing Worsened by:  Liquids Ineffective treatments:  None tried Associated symptoms: abdominal pain and diarrhea   Associated symptoms: no chills, no cough, no fever, no headaches and no sore throat   Risk factors: suspect food intake   Diarrhea Quality:  Watery Severity:  Moderate Onset quality:  Sudden Duration:  2 days Timing:  Intermittent Progression:  Unchanged Relieved by:  Nothing Worsened by:  Nothing Ineffective treatments:  Anti-motility medications Associated symptoms: abdominal pain and vomiting   Associated symptoms: no chills, no fever and no headaches   Risk factors: no recent antibiotic use     Past Medical History:  Diagnosis Date  . Anxiety   . Back pain, chronic   . Complication of anesthesia    PT HAS POST OP URINARY RETENTION:  per patient "difficult stick- needs central line or picc"  04-06-2017 AT [facility] IV STARTED BY CRNA NO ISSUE  . COPD with emphysema ([place])   . DDD (degenerative disc disease), cervical    POST MULTIPLE SURGERY'S  . DDD  (degenerative disc disease), lumbosacral    POST MULTIPLE SURGERY'S  . Difficult intravenous access   . Fecal incontinence    once a week----  TREATED W/ SACRAL NERVE STIMULATOR PLACED 12/ 2018  . Heart murmur   . History of chronic bronchitis   . History of spinal cord injury 02/09/2007   PT FELL-- S/P  CERVICAL SPINE SURGERY  . History of urinary retention    PT HAS POST OP URINARY RETENTION DUE TO ANESTHESIA  . History of venomous snake bite 2013   RIGHT MIDDLE FINGER BY COOPERHEAD--- TREATMENT W/ ANTIVENOM  . Hyperlipemia   . IBS (irritable bowel syndrome)    flairs up occasionally esp if stressed  . Incomplete right bundle branch block   . Major depression   . Mild carotid artery disease [facility] cardiologist-  dr Claiborne Billings   mild bilateral ICA per duplex 04-12-2017  1-39%  . MVP (mitral valve prolapse)    no evidence stenosis or regurgitation per last echo 10-25-2015  . OA (osteoarthritis)   . PONV (postoperative nausea and vomiting)   . Pre-diabetes   . PSVT (paroxysmal supraventricular tachycardia) [facility])    cardiologist-- dr Claiborne Billings    Patient Active Problem List   Diagnosis Date Noted  . Acute blood loss anemia 11/20/2015  . Primary osteoarthritis of left hip 10/22/2015  . Preoperative clearance 10/10/2015  . TIA (transient ischemic attack) 09/02/2015  . Alcohol intoxication ([place]) 09/02/2015  .  Hypokalemia 09/02/2015  . Acute encephalopathy 09/02/2015  . First degree AV block 03/02/2015  . DJD (degenerative joint disease) 07/31/2014  . Primary osteoarthritis of right hip 07/05/2014  . SVT (supraventricular tachycardia) ([place]) 02/27/2014  . Hyperlipidemia 02/27/2014  . History of tobacco abuse 02/23/2013  . Snake bite 01/04/2012  . Inguinal lymphadenopathy 10/01/2011  . DEPRESSION 11/07/2008  . Mitral valve disorder 11/07/2008  . COPD (chronic obstructive pulmonary disease) ([place]) 11/07/2008    Past Surgical History:  Procedure Laterality Date  . ABDOMINAL RECTOPEXY   2011       dr fuller  [facility])   prolapse rectum  . ANAL RECTAL MANOMETRY N/A 03/01/2017   Procedure: ANO RECTAL MANOMETRY;  Surgeon: Leighton Ruff, MD;  Location: [facility] ENDOSCOPY;  Service: Endoscopy;  Laterality: N/A;  . ANTERIOR CERVICAL DECOMP/DISCECTOMY FUSION  05-28-2000    dr Louanne Skye   C3-4  &  C7-T1  . ANTERIOR CERVICAL DECOMPRESSION LAMINECTOMY AND FUSION  02-09-2007    dr Arnoldo Morale   decompression/ laminectomy C3-4 & C5-6;  laminectomy C2; fusion C2-5  . BREAST SURGERY  1976   breast reduction  . COLONOSCOPY    . FORAMINOTOMY 1 LEVEL  11-03-1999   dr Louanne Skye  [facility]   left C7-T1 w/ decompression left C8  . KNEE SURGERY  AGE 48   bone spur    . LUMBAR LAMINECTOMY/ DECOMPRESSION WITH MET-RX  02-09-2005  dr Flavia Shipper   L2-3 laminectomy left cage; fusion L2-4  . POSTERIOR CERVICAL FUSION/FORAMINOTOMY  12-08-2000    dr Louanne Skye   right forminotomy C7-T1 and posterior fusion  . RIGHT THUMB COLLATERAL LIGAMENT RECONSTRUCTION  02-08-2003   dr Amedeo Plenty  . RIGHT THUMB LIGAMENT REPAIR, ARTHROTOMY, SYNOVECTOMY  10-26-2002    dr Amedeo Plenty  . [place] to 01/2007   total 14 surgery's  cervical and lumbar  (including fusion's, diskectomy, laminectomy's, foraminotomy's)  . TONSILLECTOMY    . TOTAL HIP ARTHROPLASTY Right 07/31/2014   Procedure: TOTAL HIP ARTHROPLASTY ANTERIOR APPROACH;  Surgeon: Renette Butters, MD;  Location: [facility];  Service: Orthopedics;  Laterality: Right;  . TOTAL HIP ARTHROPLASTY Left 11/19/2015   Procedure: TOTAL HIP ARTHROPLASTY ANTERIOR APPROACH;  Surgeon: Renette Butters, MD;  Location: [facility];  Service: Orthopedics;  Laterality: Left;  . TRANSTHORACIC ECHOCARDIOGRAM  10-25-2015   dr t. Claiborne Billings   ef 60-655/  no evidence MV stenosis or regurgitation/    . TUBAL LIGATION       OB History   No obstetric history on file.      Home Medications    Prior to Admission medications   Medication Sig Start Date End Date Taking? Authorizing Provider  aspirin EC 81 MG tablet  Take 81 mg by mouth at bedtime.    [provider]  atenolol (TENORMIN) 25 MG tablet Take 0.5 tablets (12.5 mg total) by mouth 2 (two) times daily. 06/09/18   Troy Sine, MD  atorvastatin (LIPITOR) 10 MG tablet Take 1 tablet (10 mg total) by mouth daily. 10/27/18   Almyra Deforest, PA  baclofen (LIORESAL) 20 MG tablet Take 20 mg by mouth 3 (three) times daily as needed for muscle spasms.     [provider]  Calcium Carbonate-Vitamin D (CALCIUM + D PO) Take 1 tablet by mouth 2 (two) times daily. Calcium Magnesium Zinc and Vitamin D    [provider]  clonazePAM (KLONOPIN) 1 MG tablet Take 1 mg by mouth at bedtime.    [provider]  fish  oil-omega-3 fatty acids 1000 MG capsule Take 1 g by mouth daily.     [provider]  Multiple Vitamin (MULTIVITAMIN WITH MINERALS) TABS tablet Take 1 tablet by mouth daily.    [provider]  verapamil (CALAN-SR) 240 MG CR tablet TAKE 1 TABLET BY MOUTH DAILY AT BEDTIME 06/09/18   Troy Sine, MD    Family History Family History  Problem Relation Age of Onset  . COPD Father   . Diabetes Mother   . Heart disease Mother   . Kidney disease Mother   . Hypertension Mother     Social History Social History   Tobacco Use  . Smoking status: Former Smoker    Packs/day: 0.75    Years: 42.00    Pack years: 31.50    Types: Cigarettes    Quit date: 08/08/2017    Years since quitting: 1.4  . Smokeless tobacco: Never Used  Substance Use Topics  . Alcohol use: Yes    Alcohol/week: 5.0 - 6.0 standard drinks    Types: 5 - 6 Glasses of wine per week    Comment: occasional  . Drug use: No     Allergies   Bupropion hcl, Celecoxib, Naproxen, Methadone, and Morphine   Review of Systems Review of Systems  Constitutional: Positive for fatigue. Negative for chills and fever.  HENT: Negative for sore throat.   Eyes: Negative for visual disturbance.  Respiratory: Negative for cough and shortness of breath.    Cardiovascular: Negative for chest pain.  Gastrointestinal: Positive for abdominal pain, diarrhea and vomiting.  Genitourinary: Negative for dysuria.  Musculoskeletal: Negative for neck pain.  Skin: Negative for rash.  Neurological: Negative for headaches.     Physical Exam Updated Vital Signs BP (!) 110/47 (BP Location: Right Arm)   Pulse 87   Temp 98.5 F (36.9 C) (Oral)   Resp 17   SpO2 95%   Physical Exam Vitals signs and nursing note reviewed.  Constitutional:      General: She is not in acute distress.    Appearance: Normal appearance. She is well-developed.  HENT:     Head: Normocephalic and atraumatic.  Eyes:     Conjunctiva/sclera: Conjunctivae normal.  Neck:     Musculoskeletal: Neck supple.  Cardiovascular:     Rate and Rhythm: Normal rate and regular rhythm.     Heart sounds: No murmur.  Pulmonary:     Effort: Pulmonary effort is normal. No respiratory distress.     Breath sounds: Normal breath sounds.  Abdominal:     Palpations: Abdomen is soft.     Tenderness: There is no abdominal tenderness.  Musculoskeletal: Normal range of motion.     Right lower leg: No edema.     Left lower leg: No edema.  Skin:    General: Skin is warm and dry.     Capillary Refill: Capillary refill takes less than 2 seconds.  Neurological:     General: No focal deficit present.     Mental Status: She is alert and oriented to person, place, and time.     Sensory: No sensory deficit.     Motor: No weakness.      ED Treatments / Results  Labs (all labs ordered are listed, but only abnormal results are displayed) Labs Reviewed  COMPREHENSIVE METABOLIC PANEL - Abnormal; Notable for the following components:      Result Value   Chloride 95 (*)    Glucose, Bld 113 (*)    BUN  37 (*)    Creatinine, Ser 1.06 (*)    Total Bilirubin 1.4 (*)    GFR calc non Af Amer 55 (*)    All other components within normal limits  CBC - Abnormal; Notable for the following components:    RBC 5.26 (*)    Hemoglobin 17.6 (*)    HCT 52.4 (*)    All other components within normal limits  URINALYSIS, ROUTINE W REFLEX MICROSCOPIC - Abnormal; Notable for the following components:   Color, Urine AMBER (*)    APPearance CLOUDY (*)    Ketones, ur 20 (*)    Protein, ur 30 (*)    Leukocytes,Ua LARGE (*)    WBC, UA >50 (*)    Bacteria, UA RARE (*)    Non Squamous Epithelial 6-10 (*)    All other components within normal limits  LIPASE, BLOOD    EKG None  Radiology No results found.  Procedures Procedures (including critical care time)  Medications Ordered in ED Medications  sodium chloride flush (NS) 0.9 % injection 3 mL (3 mLs Intravenous Not Given 01/03/19 2215)  ondansetron (ZOFRAN) injection 4 mg (has no administration in time range)  sodium chloride 0.9 % bolus 1,000 mL (has no administration in time range)  ondansetron (ZOFRAN-ODT) disintegrating tablet 4 mg (4 mg Oral Given 01/03/19 1454)     Initial Impression / Assessment and Plan / ED Course  I have reviewed the triage vital signs and the nursing notes.  Pertinent labs & imaging results that were available during my care of the patient were reviewed by me and considered in my medical decision making (see chart for details).  Clinical Course as of Jan 03 1025  Tue Jan 03, 6639  1141 68 year old female with no significant past medical history here with 2 days of nausea vomiting diarrhea in the setting of possible food poisoning.  She has some abdominal soreness but really a nonfocal exam.  She is not toxic appearing.  She had some labs done in triage that show some hemoconcentration and a little bump in her creatinine.  She is getting some IV fluids and some Zofran.  I do not think she needs a CT at this point   [MB]    Clinical Course User Index [MB] Hayden Rasmussen, MD        Final Clinical Impressions(s) / ED Diagnoses   Final diagnoses:  Nausea vomiting and diarrhea  Gastroenteritis  Dehydration   Pyuria    ED Discharge Orders    None       Hayden Rasmussen, MD 01/04/19 1027

## 2019-01-04 DIAGNOSIS — K529 Noninfective gastroenteritis and colitis, unspecified: Secondary | ICD-10-CM | POA: Diagnosis not present

## 2019-01-04 LAB — URINALYSIS, ROUTINE W REFLEX MICROSCOPIC
Bilirubin Urine: NEGATIVE
Glucose, UA: NEGATIVE mg/dL
Hgb urine dipstick: NEGATIVE
Ketones, ur: 20 mg/dL — AB
Nitrite: NEGATIVE
Protein, ur: 30 mg/dL — AB
Specific Gravity, Urine: 1.024 (ref 1.005–1.030)
WBC, UA: >50 WBC/hpf — ABNORMAL HIGH (ref 0–5)
pH: 5 (ref 5.0–8.0)

## 2019-01-04 MED ORDER — ONDANSETRON HCL 4 MG PO TABS: 4.0000 mg | ORAL_TABLET | Freq: Four times a day (QID) | ORAL | 0 refills | Status: DC | PRN

## 2019-01-04 NOTE — ED Notes (Signed)
PO challenge started

## 2019-01-04 NOTE — Discharge Instructions (Addendum)
Return if you have any problems.  Your urine had white blood cells without bacteria. If you develop any problems with urination or start running a fever, then contact your primary care provider to get started on antibiotics.

## 2019-01-04 NOTE — ED Provider Notes (Signed)
Care assumed from Dr. Melina Copa, patient presented with vomiting and diarrhea, labs showing elevated BUN.  She has received IV fluids and ondansetron and feels much better.  She has tolerated oral fluids without recurrent vomiting.  She is felt to be safe for discharge.  Incidental finding of pyuria without bacteria.  He she has no urinary symptoms.  I do not feel that this needs to be treated.  She is advised to contact her PCP if she develops symptoms of a urinary tract infection.  She is discharged with prescription for ondansetron and told to take over-the-counter loperamide as needed.   Sara Fuel, MD [id number] [phone]

## 2019-01-07 ENCOUNTER — Emergency Department (HOSPITAL_COMMUNITY): Payer: BC Managed Care – PPO

## 2019-01-07 ENCOUNTER — Other Ambulatory Visit: Payer: Self-pay

## 2019-01-07 ENCOUNTER — Inpatient Hospital Stay (HOSPITAL_COMMUNITY)
Admission: EM | Admit: 2019-01-07 | Discharge: 2019-01-10 | DRG: 389 | Disposition: A | Discharge status: Home / Self Care | Payer: BC Managed Care – PPO | Attending: Internal Medicine | Admitting: Internal Medicine

## 2019-01-07 ENCOUNTER — Encounter (HOSPITAL_COMMUNITY): Payer: Self-pay | Admitting: Emergency Medicine

## 2019-01-07 DIAGNOSIS — Z841 Family history of disorders of kidney and ureter: Secondary | ICD-10-CM

## 2019-01-07 DIAGNOSIS — Z20828 Contact with and (suspected) exposure to other viral communicable diseases: Secondary | ICD-10-CM | POA: Diagnosis present

## 2019-01-07 DIAGNOSIS — Z888 Allergy status to other drugs, medicaments and biological substances status: Secondary | ICD-10-CM

## 2019-01-07 DIAGNOSIS — E876 Hypokalemia: Secondary | ICD-10-CM | POA: Diagnosis present

## 2019-01-07 DIAGNOSIS — M419 Scoliosis, unspecified: Secondary | ICD-10-CM | POA: Diagnosis present

## 2019-01-07 DIAGNOSIS — I2510 Atherosclerotic heart disease of native coronary artery without angina pectoris: Secondary | ICD-10-CM | POA: Diagnosis present

## 2019-01-07 DIAGNOSIS — Z981 Arthrodesis status: Secondary | ICD-10-CM

## 2019-01-07 DIAGNOSIS — J449 Chronic obstructive pulmonary disease, unspecified: Secondary | ICD-10-CM | POA: Diagnosis present

## 2019-01-07 DIAGNOSIS — F419 Anxiety disorder, unspecified: Secondary | ICD-10-CM | POA: Diagnosis present

## 2019-01-07 DIAGNOSIS — Z885 Allergy status to narcotic agent status: Secondary | ICD-10-CM

## 2019-01-07 DIAGNOSIS — K219 Gastro-esophageal reflux disease without esophagitis: Secondary | ICD-10-CM | POA: Diagnosis present

## 2019-01-07 DIAGNOSIS — R7303 Prediabetes: Secondary | ICD-10-CM | POA: Diagnosis present

## 2019-01-07 DIAGNOSIS — F329 Major depressive disorder, single episode, unspecified: Secondary | ICD-10-CM | POA: Diagnosis present

## 2019-01-07 DIAGNOSIS — Z87891 Personal history of nicotine dependence: Secondary | ICD-10-CM

## 2019-01-07 DIAGNOSIS — Z825 Family history of asthma and other chronic lower respiratory diseases: Secondary | ICD-10-CM

## 2019-01-07 DIAGNOSIS — Z8249 Family history of ischemic heart disease and other diseases of the circulatory system: Secondary | ICD-10-CM

## 2019-01-07 DIAGNOSIS — K623 Rectal prolapse: Secondary | ICD-10-CM | POA: Diagnosis present

## 2019-01-07 DIAGNOSIS — Z0189 Encounter for other specified special examinations: Secondary | ICD-10-CM

## 2019-01-07 DIAGNOSIS — N3001 Acute cystitis with hematuria: Secondary | ICD-10-CM | POA: Diagnosis present

## 2019-01-07 DIAGNOSIS — E785 Hyperlipidemia, unspecified: Secondary | ICD-10-CM | POA: Diagnosis present

## 2019-01-07 DIAGNOSIS — M1990 Unspecified osteoarthritis, unspecified site: Secondary | ICD-10-CM | POA: Diagnosis present

## 2019-01-07 DIAGNOSIS — Z96643 Presence of artificial hip joint, bilateral: Secondary | ICD-10-CM | POA: Diagnosis present

## 2019-01-07 DIAGNOSIS — K56609 Unspecified intestinal obstruction, unspecified as to partial versus complete obstruction: Secondary | ICD-10-CM | POA: Diagnosis not present

## 2019-01-07 DIAGNOSIS — N390 Urinary tract infection, site not specified: Secondary | ICD-10-CM

## 2019-01-07 DIAGNOSIS — Z9851 Tubal ligation status: Secondary | ICD-10-CM

## 2019-01-07 DIAGNOSIS — M5137 Other intervertebral disc degeneration, lumbosacral region: Secondary | ICD-10-CM | POA: Diagnosis present

## 2019-01-07 DIAGNOSIS — M21951 Unspecified acquired deformity of right thigh: Secondary | ICD-10-CM | POA: Diagnosis present

## 2019-01-07 DIAGNOSIS — Z79899 Other long term (current) drug therapy: Secondary | ICD-10-CM

## 2019-01-07 DIAGNOSIS — Z833 Family history of diabetes mellitus: Secondary | ICD-10-CM

## 2019-01-07 DIAGNOSIS — I4510 Unspecified right bundle-branch block: Secondary | ICD-10-CM | POA: Diagnosis present

## 2019-01-07 DIAGNOSIS — I471 Supraventricular tachycardia: Secondary | ICD-10-CM | POA: Diagnosis present

## 2019-01-07 DIAGNOSIS — Z7982 Long term (current) use of aspirin: Secondary | ICD-10-CM

## 2019-01-07 DIAGNOSIS — K589 Irritable bowel syndrome without diarrhea: Secondary | ICD-10-CM | POA: Diagnosis present

## 2019-01-07 DIAGNOSIS — K565 Intestinal adhesions [bands], unspecified as to partial versus complete obstruction: Secondary | ICD-10-CM | POA: Diagnosis not present

## 2019-01-07 LAB — URINALYSIS, ROUTINE W REFLEX MICROSCOPIC
Bilirubin Urine: NEGATIVE
Glucose, UA: NEGATIVE mg/dL
Hgb urine dipstick: NEGATIVE
Ketones, ur: 80 mg/dL — AB
Nitrite: NEGATIVE
Protein, ur: 30 mg/dL — AB
Specific Gravity, Urine: 1.02 (ref 1.005–1.030)
WBC, UA: >50 WBC/hpf — ABNORMAL HIGH (ref 0–5)
pH: 5 (ref 5.0–8.0)

## 2019-01-07 LAB — COMPREHENSIVE METABOLIC PANEL
ALT: 42 U/L (ref 0–44)
AST: 40 U/L (ref 15–41)
Albumin: 3.1 g/dL — ABNORMAL LOW (ref 3.5–5.0)
Alkaline Phosphatase: 68 U/L (ref 38–126)
Anion gap: 13 (ref 5–15)
BUN: 22 mg/dL (ref 8–23)
CO2: 24 mmol/L (ref 22–32)
Calcium: 8.8 mg/dL — ABNORMAL LOW (ref 8.9–10.3)
Chloride: 100 mmol/L (ref 98–111)
Creatinine, Ser: 0.88 mg/dL (ref 0.44–1.00)
GFR calc Af Amer: >60 mL/min (ref >60)
GFR calc non Af Amer: >60 mL/min (ref >60)
Glucose, Bld: 82 mg/dL (ref 70–99)
Potassium: 3.9 mmol/L (ref 3.5–5.1)
Sodium: 137 mmol/L (ref 135–145)
Total Bilirubin: 1.2 mg/dL (ref 0.3–1.2)
Total Protein: 5.4 g/dL — ABNORMAL LOW (ref 6.5–8.1)

## 2019-01-07 LAB — CBC
HCT: 44.9 % (ref 36.0–46.0)
Hemoglobin: 14.8 g/dL (ref 12.0–15.0)
MCH: 33.5 pg (ref 26.0–34.0)
MCHC: 33 g/dL (ref 30.0–36.0)
MCV: 101.6 fL — ABNORMAL HIGH (ref 80.0–100.0)
Platelets: 176 10*3/uL (ref 150–400)
RBC: 4.42 MIL/uL (ref 3.87–5.11)
RDW: 12.2 % (ref 11.5–15.5)
WBC: 12.4 10*3/uL — ABNORMAL HIGH (ref 4.0–10.5)
nRBC: 0 % (ref 0.0–0.2)

## 2019-01-07 LAB — LIPASE, BLOOD: Lipase: 48 U/L (ref 11–51)

## 2019-01-07 MED ORDER — ONDANSETRON HCL 4 MG/2ML IJ SOLN
4.0000 mg | Freq: Once | INTRAMUSCULAR | Status: AC
  Administered 2019-01-07: 4 mg via INTRAVENOUS
  Filled 2019-01-07: qty 2

## 2019-01-07 MED ORDER — SODIUM CHLORIDE 0.9 % IV BOLUS
1000.0000 mL | Freq: Once | INTRAVENOUS | Status: AC
  Administered 2019-01-07: 1000 mL via INTRAVENOUS

## 2019-01-07 MED ORDER — SODIUM CHLORIDE 0.9 % IV SOLN
1.0000 g | Freq: Once | INTRAVENOUS | Status: AC
  Administered 2019-01-08: 1 g via INTRAVENOUS
  Filled 2019-01-07: qty 10

## 2019-01-07 MED ORDER — IOHEXOL 300 MG/ML  SOLN
80.0000 mL | Freq: Once | INTRAMUSCULAR | Status: AC | PRN
  Administered 2019-01-07: 80 mL via INTRAVENOUS

## 2019-01-07 MED ORDER — FENTANYL CITRATE (PF) 100 MCG/2ML IJ SOLN
50.0000 ug | Freq: Once | INTRAMUSCULAR | Status: AC
  Administered 2019-01-07: 50 ug via INTRAVENOUS
  Filled 2019-01-07: qty 2

## 2019-01-07 MED ORDER — PROMETHAZINE HCL 25 MG/ML IJ SOLN
12.5000 mg | Freq: Once | INTRAMUSCULAR | Status: AC
  Administered 2019-01-07: 12.5 mg via INTRAVENOUS
  Filled 2019-01-07: qty 1

## 2019-01-07 NOTE — ED Notes (Signed)
Attempted IV x 1 unsuccessful.

## 2019-01-07 NOTE — ED Notes (Signed)
Md at beside attempting to get labs.

## 2019-01-07 NOTE — ED Notes (Signed)
Pt ambulated to bathroom 

## 2019-01-07 NOTE — ED Notes (Signed)
Another MD at bedside attempting to get labs.

## 2019-01-07 NOTE — ED Provider Notes (Signed)
Medical screening examination/treatment/procedure(s) were conducted as a shared visit with non-physician practitioner(s) and myself.  I personally evaluated the patient during the encounter.    67yF with persistent abdominal pain and n/v. UA again noted. At this point, will tx for UTI. Possible SBO on imaging. NGT. Admit.   Angiocath insertion Performed by: Virgel Manifold  Consent: Verbal consent obtained. Risks and benefits: risks, benefits and alternatives were discussed Time out: Immediately prior to procedure a "time out" was called to verify the correct patient, procedure, equipment, support staff and site/side marked as required.  Preparation: Patient was prepped and draped in the usual sterile fashion.  Vein Location: R AC  Ultrasound Guided  Gauge: 20  Normal blood return and flush without difficulty Patient tolerance: Patient tolerated the procedure well with no immediate complications.      Virgel Manifold, MD 01/08/19 1426

## 2019-01-07 NOTE — ED Provider Notes (Addendum)
[facility] Provider Note   CSN: [id number] Arrival date & time: 01/07/19  1603     History   Chief Complaint Chief Complaint  Patient presents with  . Emesis    HPI [name] is a 68 y.o. female has no history of DDD, spinal cord injury, hyperlipidemia, IBS who presents for evaluation of nausea and vomiting that began last night.  She was seen here on 01/03/2019 for nausea/vomiting.  Was diagnosed with gastroenteritis and discharged after IV fluids and Zofran improved her symptoms.  She reports she has been feeling better.  She was still doing a bland, soft diet and had not been eating much but had not had any vomiting.  She reports that last night, the vomiting started again and has continued today.  She has not been able to tolerate any p.o.  Emesis is nonbloody, nonbilious.  She has had some abdominal discomfort that she states began after vomiting.  She has not had any diarrhea.  Her last bowel movement was on 01/04/2019.  She is still been passing flatus.  She has not noted any fevers.  She does not recall any abnormal foods.  She has not had any CP, SOB, urinary complaints. She has had rectal prolapse surgery but no other abdominal surgery.      The history is provided by the patient.    Past Medical History:  Diagnosis Date  . Anxiety   . Back pain, chronic   . Complication of anesthesia    PT HAS POST OP URINARY RETENTION:  per patient "difficult stick- needs central line or picc"  04-06-2017 AT [facility] IV STARTED BY CRNA NO ISSUE  . COPD with emphysema ([place])   . DDD (degenerative disc disease), cervical    POST MULTIPLE SURGERY'S  . DDD (degenerative disc disease), lumbosacral    POST MULTIPLE SURGERY'S  . Difficult intravenous access   . Fecal incontinence    once a week----  TREATED W/ SACRAL NERVE STIMULATOR PLACED 12/ 2018  . Heart murmur   . History of chronic bronchitis   . History of spinal cord injury 02/09/2007   PT  FELL-- S/P  CERVICAL SPINE SURGERY  . History of urinary retention    PT HAS POST OP URINARY RETENTION DUE TO ANESTHESIA  . History of venomous snake bite 2013   RIGHT MIDDLE FINGER BY COOPERHEAD--- TREATMENT W/ ANTIVENOM  . Hyperlipemia   . IBS (irritable bowel syndrome)    flairs up occasionally esp if stressed  . Incomplete right bundle branch block   . Major depression   . Mild carotid artery disease [facility]) cardiologist-  dr Claiborne Billings   mild bilateral ICA per duplex 04-12-2017  1-39%  . MVP (mitral valve prolapse)    no evidence stenosis or regurgitation per last echo 10-25-2015  . OA (osteoarthritis)   . PONV (postoperative nausea and vomiting)   . Pre-diabetes   . PSVT (paroxysmal supraventricular tachycardia) [facility])    cardiologist-- dr Claiborne Billings    Patient Active Problem List   Diagnosis Date Noted  . SBO (small bowel obstruction) ([place]) 01/08/2019  . Acute blood loss anemia 11/20/2015  . Primary osteoarthritis of left hip 10/22/2015  . Preoperative clearance 10/10/2015  . TIA (transient ischemic attack) 09/02/2015  . Alcohol intoxication ([place]) 09/02/2015  . Hypokalemia 09/02/2015  . Acute encephalopathy 09/02/2015  . First degree AV block 03/02/2015  . DJD (degenerative joint disease) 07/31/2014  . Primary osteoarthritis of right hip 07/05/2014  .  SVT (supraventricular tachycardia) ([place]) 02/27/2014  . Hyperlipidemia 02/27/2014  . History of tobacco abuse 02/23/2013  . Snake bite 01/04/2012  . Inguinal lymphadenopathy 10/01/2011  . UTI (urinary tract infection) 11/09/2008  . DEPRESSION 11/07/2008  . Mitral valve disorder 11/07/2008  . COPD (chronic obstructive pulmonary disease) ([place]) 11/07/2008    Past Surgical History:  Procedure Laterality Date  . ABDOMINAL RECTOPEXY  2011       dr fuller  [facility])   prolapse rectum  . ANAL RECTAL MANOMETRY N/A 03/01/2017   Procedure: ANO RECTAL MANOMETRY;  Surgeon: Leighton Ruff, MD;  Location: [facility];  Service:  Endoscopy;  Laterality: N/A;  . ANTERIOR CERVICAL DECOMP/DISCECTOMY FUSION  05-28-2000    dr Louanne Skye   C3-4  &  C7-T1  . ANTERIOR CERVICAL DECOMPRESSION LAMINECTOMY AND FUSION  02-09-2007    dr Arnoldo Morale   decompression/ laminectomy C3-4 & C5-6;  laminectomy C2; fusion C2-5  . BREAST SURGERY  1976   breast reduction  . COLONOSCOPY    . FORAMINOTOMY 1 LEVEL  11-03-1999   dr Louanne Skye  [facility]   left C7-T1 w/ decompression left C8  . KNEE SURGERY  AGE 54   bone spur    . LUMBAR LAMINECTOMY/ DECOMPRESSION WITH MET-RX  02-09-2005  dr Flavia Shipper   L2-3 laminectomy left cage; fusion L2-4  . POSTERIOR CERVICAL FUSION/FORAMINOTOMY  12-08-2000    dr Louanne Skye   right forminotomy C7-T1 and posterior fusion  . RIGHT THUMB COLLATERAL LIGAMENT RECONSTRUCTION  02-08-2003   dr Amedeo Plenty  . RIGHT THUMB LIGAMENT REPAIR, ARTHROTOMY, SYNOVECTOMY  10-26-2002    dr Amedeo Plenty  . [place] to 01/2007   total 14 surgery's  cervical and lumbar  (including fusion's, diskectomy, laminectomy's, foraminotomy's)  . TONSILLECTOMY    . TOTAL HIP ARTHROPLASTY Right 07/31/2014   Procedure: TOTAL HIP ARTHROPLASTY ANTERIOR APPROACH;  Surgeon: Renette Butters, MD;  Location: [place];  Service: Orthopedics;  Laterality: Right;  . TOTAL HIP ARTHROPLASTY Left 11/19/2015   Procedure: TOTAL HIP ARTHROPLASTY ANTERIOR APPROACH;  Surgeon: Renette Butters, MD;  Location: [place];  Service: Orthopedics;  Laterality: Left;  . TRANSTHORACIC ECHOCARDIOGRAM  10-25-2015   dr t. Claiborne Billings   ef 60-655/  no evidence MV stenosis or regurgitation/    . TUBAL LIGATION       OB History   No obstetric history on file.      Home Medications    Prior to Admission medications   Medication Sig Start Date End Date Taking? Authorizing Provider  atenolol (TENORMIN) 25 MG tablet Take 0.5 tablets (12.5 mg total) by mouth 2 (two) times daily. 06/09/18  Yes Troy Sine, MD  aspirin EC 81 MG tablet Take 81 mg by mouth at bedtime.    [provider]   atorvastatin (LIPITOR) 10 MG tablet Take 1 tablet (10 mg total) by mouth daily. Patient taking differently: Take 10 mg by mouth daily at 6 PM.  10/27/18   Almyra Deforest, PA  baclofen (LIORESAL) 20 MG tablet Take 20 mg by mouth 3 (three) times daily as needed for muscle spasms.     [provider]  bismuth subsalicylate (PEPTO BISMOL) 262 MG/15ML suspension Take 30 mLs by mouth every 6 (six) hours as needed for indigestion.    [provider]  Calcium Carbonate-Vitamin D (CALCIUM + D PO) Take 1 tablet by mouth 2 (two) times daily. Calcium Magnesium Zinc and Vitamin D    [provider]  clonazePAM (KLONOPIN) 1 MG  tablet Take 1 mg by mouth at bedtime.    [provider]  dimenhyDRINATE (DRAMAMINE) 50 MG tablet Take 25 mg by mouth every 8 (eight) hours as needed for nausea.    [provider]  fish oil-omega-3 fatty acids 1000 MG capsule Take 1 g by mouth at bedtime.     [provider]  Multiple Vitamin (MULTIVITAMIN WITH MINERALS) TABS tablet Take 1 tablet by mouth daily.    [provider]  omeprazole (PRILOSEC) 20 MG capsule Take 20 mg by mouth daily. 10/25/18   [provider]  ondansetron (ZOFRAN) 4 MG tablet Take 1 tablet (4 mg total) by mouth every 6 (six) hours as needed for nausea or vomiting. 12/28/31   Delora Fuel, MD  verapamil (CALAN-SR) 240 MG CR tablet TAKE 1 TABLET BY MOUTH DAILY AT BEDTIME Patient taking differently: Take 240 mg by mouth at bedtime.  06/09/18   Troy Sine, MD    Family History Family History  Problem Relation Age of Onset  . COPD Father   . Diabetes Mother   . Heart disease Mother   . Kidney disease Mother   . Hypertension Mother     Social History Social History   Tobacco Use  . Smoking status: Former Smoker    Packs/day: 0.75    Years: 42.00    Pack years: 31.50    Types: Cigarettes    Quit date: 08/08/2017    Years since quitting: 1.4  . Smokeless tobacco: Never Used  Substance  Use Topics  . Alcohol use: Yes    Alcohol/week: 5.0 - 6.0 standard drinks    Types: 5 - 6 Glasses of wine per week    Comment: occasional  . Drug use: No     Allergies   Bupropion hcl, Celecoxib, Naproxen, Methadone, and Morphine   Review of Systems Review of Systems  Constitutional: Negative for fever.  Respiratory: Negative for cough and shortness of breath.   Cardiovascular: Negative for chest pain.  Gastrointestinal: Positive for abdominal pain, nausea and vomiting.  Genitourinary: Negative for dysuria and hematuria.  Neurological: Negative for headaches.  All other systems reviewed and are negative.    Physical Exam Updated Vital Signs BP (!) 110/48   Pulse 89   Temp 99 F (37.2 C) (Oral)   Resp 16   Ht '5\' 7"'$  (1.702 m)   Wt 45.4 kg   SpO2 92%   BMI 15.66 kg/m   Physical Exam Vitals signs and nursing note reviewed.  Constitutional:      Appearance: Normal appearance. She is well-developed.  HENT:     Head: Normocephalic and atraumatic.  Eyes:     General: Lids are normal.     Conjunctiva/sclera: Conjunctivae normal.     Pupils: Pupils are equal, round, and reactive to light.  Neck:     Musculoskeletal: Full passive range of motion without pain.  Cardiovascular:     Rate and Rhythm: Normal rate and regular rhythm.     Pulses: Normal pulses.     Heart sounds: Normal heart sounds. No murmur. No friction rub. No gallop.   Pulmonary:     Effort: Pulmonary effort is normal.     Breath sounds: Normal breath sounds.     Comments: Lungs clear to auscultation bilaterally.  Symmetric chest rise.  No wheezing, rales, rhonchi. Abdominal:     Palpations: Abdomen is soft. Abdomen is not rigid.     Tenderness: There is abdominal tenderness in the epigastric area  and suprapubic area. There is no right CVA tenderness or guarding. Negative signs include McBurney's sign.     Comments: Abdomen is soft, nondistended.  Tenderness noted suprapubic and epigastric region.  No  rigidity, guarding.  No CVA tenderness bilaterally.  Musculoskeletal: Normal range of motion.  Skin:    General: Skin is warm and dry.     Capillary Refill: Capillary refill takes less than 2 seconds.  Neurological:     Mental Status: She is alert and oriented to person, place, and time.  Psychiatric:        Speech: Speech normal.      ED Treatments / Results  Labs (all labs ordered are listed, but only abnormal results are displayed) Labs Reviewed  COMPREHENSIVE METABOLIC PANEL - Abnormal; Notable for the following components:      Result Value   Calcium 8.8 (*)    Total Protein 5.4 (*)    Albumin 3.1 (*)    All other components within normal limits  CBC - Abnormal; Notable for the following components:   WBC 12.4 (*)    MCV 101.6 (*)    All other components within normal limits  URINALYSIS, ROUTINE W REFLEX MICROSCOPIC - Abnormal; Notable for the following components:   Color, Urine AMBER (*)    APPearance CLOUDY (*)    Ketones, ur 80 (*)    Protein, ur 30 (*)    Leukocytes,Ua LARGE (*)    WBC, UA >50 (*)    Bacteria, UA RARE (*)    Non Squamous Epithelial 0-5 (*)    All other components within normal limits  URINE CULTURE  SARS CORONAVIRUS 2 (HOSPITAL ORDER, [place] [facility])  LIPASE, BLOOD  HIV ANTIBODY (ROUTINE TESTING W REFLEX)  CBC  BASIC METABOLIC PANEL  MAGNESIUM  PHOSPHORUS    EKG None  Radiology Ct Abdomen Pelvis W Contrast  Result Date: 01/07/2019 CLINICAL DATA:  Acute generalized abdominal pain EXAM: CT ABDOMEN AND PELVIS WITH CONTRAST TECHNIQUE: Multidetector CT imaging of the abdomen and pelvis was performed using the standard protocol following bolus administration of intravenous contrast. CONTRAST:  41m OMNIPAQUE IOHEXOL 300 MG/ML  SOLN COMPARISON:  Most recent comparison CT 05/13/2018 FINDINGS: Lower chest: Marked emphysematous changes are present in the lung bases. Hepatobiliary: No concerning hepatic lesions. "Rosary sign"  involving the tip of the gallbladder compatible with adenomyomatosis. No other gallbladder abnormality. No pericholecystic inflammation. Mild dilatation of the extrahepatic biliary tree is greater than expected for patient age but is unchanged prior CTs. Pancreas: Atrophic appearance of the pancreas. No visible pancreatic lesions. No pancreatic ductal dilatation. No peripancreatic inflammation. Spleen: Normal in size without focal abnormality. Adrenals/Urinary Tract: Insert adrenal glands. Few right renal sinus cysts. Otherwise normal appearance of the kidneys on both portal venous and excretory phase imaging. Urinary bladder is poorly visualized due to extensive streak artifact. Stomach/Bowel: There is fluid distention of the proximal stomach and duodenal sweep as well as the proximal small bowel which demonstrates mural thickening and some anti dependent gas within the valvulae. No definite pneumatosis. Focal transition point is present left lower quadrant (3/45 more distal small bowel is decompressed. Inspissated fecal material remains present throughout the colon. Distal colonic anastomosis appears patent. No colonic dilatation or wall thickening. Vascular/Lymphatic: Extensive atherosclerotic calcifications present throughout evaluation for abdominal adenopathy is somewhat limited given a paucity of intraperitoneal fat. No definite adenopathy is evident. Reproductive: Suboptimal evaluation of the pelvis given extensive streak artifact from patient's hip prostheses and sacral nerve  stimulator. Other: Some edematous increase in attenuation of the mid mesentery. No definite free fluid though poorly evaluated given obscuration of much of the pelvis. No free intraperitoneal air. Musculoskeletal: Bilateral total hip arthroplasties are in expected position without convincing feature of hardware complication. There is L2-L4 posterior spinal fusion with adjacent segment disease at L1-2. Marked levocurvature of the spine  is centered at the L2 level. No suspicious osseous lesions are evident. Sacral nerve stimulator battery pack is position in the left gluteal soft tissues and enters the right third sacral foramen. IMPRESSION: Imaging quality is degraded by extensive streak artifact from patient's spinal hardware and bilateral hip prostheses. Findings concerning for a small-bowel obstruction with transition point in the left lower quadrant, likely secondary to adhesions. No pneumatosis or portal venous gas. Likely reactive edematous mesenteric haziness. No large collection of free fluid though difficult to assess given streak in the pelvis. 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 Evidence of prior distal colectomy.  Patent anastomosis. Bilateral total hip arthroplasties in expected positioning without evidence of acute complication. Sacral nerve stimulator. Aortic Atherosclerosis (ICD10-I70.0). Emphysema (ICD10-J43.9). Electronically Signed   By: Lovena Le M.D.   On: 01/07/2019 23:44    Procedures Procedures (including critical care time)  Medications Ordered in ED Medications  enoxaparin (LOVENOX) injection 30 mg (has no administration in time range)  acetaminophen (TYLENOL) tablet 650 mg (has no administration in time range)    Or  acetaminophen (TYLENOL) suppository 650 mg (has no administration in time range)  0.9 %  sodium chloride infusion (has no administration in time range)  ondansetron (ZOFRAN) injection 4 mg (has no administration in time range)  cefTRIAXone (ROCEPHIN) 1 g in sodium chloride 0.9 % 100 mL IVPB (has no administration in time range)  ipratropium-albuterol (DUONEB) 0.5-2.5 (3) MG/3ML nebulizer solution 3 mL (has no administration in time range)  sodium chloride 0.9 % bolus 1,000 mL (0 mLs Intravenous Stopped 01/07/19 2148)  ondansetron (ZOFRAN) injection 4 mg (4 mg Intravenous Given 01/07/19 1954)  fentaNYL (SUBLIMAZE) injection 50 mcg (50 mcg  Intravenous Given 01/07/19 1954)  sodium chloride 0.9 % bolus 1,000 mL (0 mLs Intravenous Stopped 01/08/19 0053)  ondansetron (ZOFRAN) injection 4 mg (4 mg Intravenous Given 01/07/19 2157)  iohexol (OMNIPAQUE) 300 MG/ML solution 80 mL (80 mLs Intravenous Contrast Given 01/07/19 2252)  cefTRIAXone (ROCEPHIN) 1 g in sodium chloride 0.9 % 100 mL IVPB (0 g Intravenous Stopped 01/08/19 0051)  promethazine (PHENERGAN) injection 12.5 mg (12.5 mg Intravenous Given 01/07/19 0700)     Initial Impression / Assessment and Plan / ED Course  I have reviewed the triage vital signs and the nursing notes.  Pertinent labs & imaging results that were available during my care of the patient were reviewed by me and considered in my medical decision making (see chart for details).        68 year old female who presents for evaluation nausea/vomiting that began last night.  Seen here on the eighth for similar symptoms and was discharged at that time.  Had been doing better until symptoms began again last night.  No chest pain, difficulty breathing, fevers. Patient is afebrile, non-toxic appearing, sitting comfortably on examination table. Vital signs reviewed and stable.  Concern for infectious etiology versus GI etiology.  Plan check labs, give fluids, antiemetics.  CMP shows normal BUN and creatinine.  Otherwise unremarkable.  Lipase is normal.  CBC shows slight leukocytosis of 12.4.  No anemia.  Her CBC 4 days ago showed a  white blood cell count of 9.7.  Given that this is her second visit for vomiting and she has some abdominal pain and tenderness noted on exam.  Will plan for CT on pelvis for evaluation.  CT abdomen shows findings concerning for small bowel obstruction with transition point in left lower quadrant likely secondary to adhesions.  She also has some likely reactive edematous mesenteric haziness. Plan for NG tube and gen surg consult.   Discussed results with patient.  Patient still feeling nauseous.   Will give additional antiemetics.  Discussed patient with Dr. Kieth Brightly (Gen Surg). Agrees with NG tube. Will consult.   Discussed patient with Dr. Marlowe Sax (hospitalist). Will admit.   Portions of this note were generated with [employer]. Dictation errors may occur despite best attempts at proofreading.  Final Clinical Impressions(s) / ED Diagnoses   Final diagnoses:  Small bowel obstruction ([place])  Acute cystitis with hematuria    ED Discharge Orders    None       Volanda Napoleon, PA-C 01/08/19 0037    Volanda Napoleon, PA-C 01/08/19 0103    Virgel Manifold, MD 01/08/19 1425

## 2019-01-07 NOTE — ED Triage Notes (Signed)
Patient reports being seen on the 8th for gastroenteritis. Patient back today for N/V w/o relief from zofran prescription.

## 2019-01-08 ENCOUNTER — Encounter (HOSPITAL_COMMUNITY): Payer: Self-pay | Admitting: *Deleted

## 2019-01-08 ENCOUNTER — Inpatient Hospital Stay (HOSPITAL_COMMUNITY): Payer: BC Managed Care – PPO

## 2019-01-08 ENCOUNTER — Other Ambulatory Visit: Payer: Self-pay

## 2019-01-08 DIAGNOSIS — M419 Scoliosis, unspecified: Secondary | ICD-10-CM | POA: Diagnosis present

## 2019-01-08 DIAGNOSIS — Z825 Family history of asthma and other chronic lower respiratory diseases: Secondary | ICD-10-CM | POA: Diagnosis not present

## 2019-01-08 DIAGNOSIS — F419 Anxiety disorder, unspecified: Secondary | ICD-10-CM | POA: Diagnosis present

## 2019-01-08 DIAGNOSIS — F329 Major depressive disorder, single episode, unspecified: Secondary | ICD-10-CM | POA: Diagnosis present

## 2019-01-08 DIAGNOSIS — K56609 Unspecified intestinal obstruction, unspecified as to partial versus complete obstruction: Secondary | ICD-10-CM

## 2019-01-08 DIAGNOSIS — I4510 Unspecified right bundle-branch block: Secondary | ICD-10-CM | POA: Diagnosis present

## 2019-01-08 DIAGNOSIS — K623 Rectal prolapse: Secondary | ICD-10-CM | POA: Diagnosis present

## 2019-01-08 DIAGNOSIS — K589 Irritable bowel syndrome without diarrhea: Secondary | ICD-10-CM | POA: Diagnosis present

## 2019-01-08 DIAGNOSIS — Z833 Family history of diabetes mellitus: Secondary | ICD-10-CM | POA: Diagnosis not present

## 2019-01-08 DIAGNOSIS — Z841 Family history of disorders of kidney and ureter: Secondary | ICD-10-CM | POA: Diagnosis not present

## 2019-01-08 DIAGNOSIS — Z9851 Tubal ligation status: Secondary | ICD-10-CM | POA: Diagnosis not present

## 2019-01-08 DIAGNOSIS — E876 Hypokalemia: Secondary | ICD-10-CM | POA: Diagnosis present

## 2019-01-08 DIAGNOSIS — Z87891 Personal history of nicotine dependence: Secondary | ICD-10-CM | POA: Diagnosis not present

## 2019-01-08 DIAGNOSIS — N3001 Acute cystitis with hematuria: Secondary | ICD-10-CM | POA: Diagnosis present

## 2019-01-08 DIAGNOSIS — M21951 Unspecified acquired deformity of right thigh: Secondary | ICD-10-CM | POA: Diagnosis present

## 2019-01-08 DIAGNOSIS — M1990 Unspecified osteoarthritis, unspecified site: Secondary | ICD-10-CM | POA: Diagnosis present

## 2019-01-08 DIAGNOSIS — K565 Intestinal adhesions [bands], unspecified as to partial versus complete obstruction: Secondary | ICD-10-CM | POA: Diagnosis present

## 2019-01-08 DIAGNOSIS — K219 Gastro-esophageal reflux disease without esophagitis: Secondary | ICD-10-CM | POA: Diagnosis present

## 2019-01-08 DIAGNOSIS — Z8249 Family history of ischemic heart disease and other diseases of the circulatory system: Secondary | ICD-10-CM | POA: Diagnosis not present

## 2019-01-08 DIAGNOSIS — I471 Supraventricular tachycardia: Secondary | ICD-10-CM | POA: Diagnosis present

## 2019-01-08 DIAGNOSIS — I2510 Atherosclerotic heart disease of native coronary artery without angina pectoris: Secondary | ICD-10-CM | POA: Diagnosis present

## 2019-01-08 DIAGNOSIS — E785 Hyperlipidemia, unspecified: Secondary | ICD-10-CM | POA: Diagnosis present

## 2019-01-08 DIAGNOSIS — Z20828 Contact with and (suspected) exposure to other viral communicable diseases: Secondary | ICD-10-CM | POA: Diagnosis present

## 2019-01-08 DIAGNOSIS — M5137 Other intervertebral disc degeneration, lumbosacral region: Secondary | ICD-10-CM | POA: Diagnosis present

## 2019-01-08 DIAGNOSIS — J449 Chronic obstructive pulmonary disease, unspecified: Secondary | ICD-10-CM | POA: Diagnosis present

## 2019-01-08 HISTORY — DX: Unspecified intestinal obstruction, unspecified as to partial versus complete obstruction: K56.609

## 2019-01-08 LAB — BASIC METABOLIC PANEL
Anion gap: 12 (ref 5–15)
BUN: 20 mg/dL (ref 8–23)
CO2: 21 mmol/L — ABNORMAL LOW (ref 22–32)
Calcium: 8.6 mg/dL — ABNORMAL LOW (ref 8.9–10.3)
Chloride: 105 mmol/L (ref 98–111)
Creatinine, Ser: 0.77 mg/dL (ref 0.44–1.00)
GFR calc Af Amer: >60 mL/min (ref >60)
GFR calc non Af Amer: >60 mL/min (ref >60)
Glucose, Bld: 88 mg/dL (ref 70–99)
Potassium: 3.9 mmol/L (ref 3.5–5.1)
Sodium: 138 mmol/L (ref 135–145)

## 2019-01-08 LAB — CBC
HCT: 47 % — ABNORMAL HIGH (ref 36.0–46.0)
Hemoglobin: 15.4 g/dL — ABNORMAL HIGH (ref 12.0–15.0)
MCH: 33.3 pg (ref 26.0–34.0)
MCHC: 32.8 g/dL (ref 30.0–36.0)
MCV: 101.5 fL — ABNORMAL HIGH (ref 80.0–100.0)
Platelets: 150 10*3/uL (ref 150–400)
RBC: 4.63 MIL/uL (ref 3.87–5.11)
RDW: 12.2 % (ref 11.5–15.5)
WBC: 10.8 10*3/uL — ABNORMAL HIGH (ref 4.0–10.5)
nRBC: 0 % (ref 0.0–0.2)

## 2019-01-08 LAB — HIV ANTIBODY (ROUTINE TESTING W REFLEX): HIV Screen 4th Generation wRfx: NONREACTIVE

## 2019-01-08 LAB — MAGNESIUM: Magnesium: 1.9 mg/dL (ref 1.7–2.4)

## 2019-01-08 LAB — PHOSPHORUS: Phosphorus: 3.4 mg/dL (ref 2.5–4.6)

## 2019-01-08 LAB — SARS CORONAVIRUS 2 BY RT PCR (HOSPITAL ORDER, PERFORMED IN ~~LOC~~ HOSPITAL LAB): SARS Coronavirus 2: NEGATIVE

## 2019-01-08 MED ORDER — METOPROLOL TARTRATE 5 MG/5ML IV SOLN: 5.0000 mg | Freq: Four times a day (QID) | INTRAVENOUS | Status: DC | PRN

## 2019-01-08 MED ORDER — PANTOPRAZOLE SODIUM 40 MG IV SOLR
40.0000 mg | INTRAVENOUS | Status: DC
  Administered 2019-01-08 – 2019-01-10 (×3): 40 mg via INTRAVENOUS
  Filled 2019-01-08 (×3): qty 40

## 2019-01-08 MED ORDER — ASPIRIN 300 MG RE SUPP
150.0000 mg | Freq: Every day | RECTAL | Status: DC
  Filled 2019-01-08 (×2): qty 1

## 2019-01-08 MED ORDER — SODIUM CHLORIDE 0.9 % IV SOLN
INTRAVENOUS | Status: DC
  Administered 2019-01-08: 01:00:00 via INTRAVENOUS

## 2019-01-08 MED ORDER — ENOXAPARIN SODIUM 30 MG/0.3ML ~~LOC~~ SOLN
30.0000 mg | Freq: Every day | SUBCUTANEOUS | Status: DC
  Administered 2019-01-08 – 2019-01-10 (×3): 30 mg via SUBCUTANEOUS
  Filled 2019-01-08 (×3): qty 0.3

## 2019-01-08 MED ORDER — LORAZEPAM 2 MG/ML IJ SOLN: 0.5000 mg | Freq: Four times a day (QID) | INTRAMUSCULAR | Status: DC | PRN

## 2019-01-08 MED ORDER — POTASSIUM CL IN DEXTROSE 5% 20 MEQ/L IV SOLN
20.0000 meq | INTRAVENOUS | Status: DC
  Administered 2019-01-08 – 2019-01-10 (×4): 20 meq via INTRAVENOUS
  Filled 2019-01-08 (×6): qty 1000

## 2019-01-08 MED ORDER — MORPHINE SULFATE (PF) 2 MG/ML IV SOLN
1.0000 mg | INTRAVENOUS | Status: DC | PRN
  Administered 2019-01-09: 1 mg via INTRAVENOUS
  Filled 2019-01-08: qty 1

## 2019-01-08 MED ORDER — ONDANSETRON HCL 4 MG/2ML IJ SOLN
4.0000 mg | Freq: Four times a day (QID) | INTRAMUSCULAR | Status: DC | PRN
  Administered 2019-01-08 – 2019-01-09 (×2): 4 mg via INTRAVENOUS
  Filled 2019-01-08 (×2): qty 2

## 2019-01-08 MED ORDER — ACETAMINOPHEN 325 MG PO TABS
650.0000 mg | ORAL_TABLET | Freq: Four times a day (QID) | ORAL | Status: DC | PRN
  Filled 2019-01-08: qty 2

## 2019-01-08 MED ORDER — ACETAMINOPHEN 650 MG RE SUPP: 650.0000 mg | Freq: Four times a day (QID) | RECTAL | Status: DC | PRN

## 2019-01-08 MED ORDER — IPRATROPIUM-ALBUTEROL 0.5-2.5 (3) MG/3ML IN SOLN: 3.0000 mL | Freq: Four times a day (QID) | RESPIRATORY_TRACT | Status: DC | PRN

## 2019-01-08 MED ORDER — DIATRIZOATE MEGLUMINE & SODIUM 66-10 % PO SOLN
90.0000 mL | Freq: Once | ORAL | Status: AC
  Administered 2019-01-08: 90 mL via NASOGASTRIC
  Filled 2019-01-08: qty 90

## 2019-01-08 MED ORDER — DIPHENHYDRAMINE HCL 50 MG/ML IJ SOLN: 25.0000 mg | Freq: Four times a day (QID) | INTRAMUSCULAR | Status: DC | PRN

## 2019-01-08 MED ORDER — LORAZEPAM 2 MG/ML IJ SOLN
0.5000 mg | Freq: Once | INTRAMUSCULAR | Status: AC
  Administered 2019-01-08: 0.5 mg via INTRAVENOUS
  Filled 2019-01-08: qty 1

## 2019-01-08 MED ORDER — SODIUM CHLORIDE 0.9 % IV SOLN
1.0000 g | INTRAVENOUS | Status: DC
  Administered 2019-01-08 – 2019-01-09 (×2): 1 g via INTRAVENOUS
  Filled 2019-01-08: qty 1
  Filled 2019-01-08 (×2): qty 10

## 2019-01-08 MED ORDER — PROMETHAZINE HCL 25 MG/ML IJ SOLN
12.5000 mg | Freq: Four times a day (QID) | INTRAMUSCULAR | Status: DC | PRN
  Administered 2019-01-08 – 2019-01-09 (×4): 12.5 mg via INTRAVENOUS
  Filled 2019-01-08 (×4): qty 1

## 2019-01-08 NOTE — H&P (Signed)
History and Physical    [name] OPF:[id number] DOB: 12-15-1950 DOA: 01/07/2019  PCP: Raina Mina., MD Patient coming from: Home  Chief Complaint: Emesis  HPI: [name] is a 68 y.o. female with medical history significant of COPD, DDD, spinal cord injury, hyperlipidemia, IBS, incomplete RBBB, depression, anxiety, carotid artery disease, PSVT presenting to the hospital for evaluation of nausea and vomiting.  Patient was seen in the ED on 9/8 for nausea and vomiting and was diagnosed with gastroenteritis at that time.  She was discharged after IV fluids and Zofran improved her symptoms.  Patient reports 1 week history of nausea and vomiting.  States she was seen in the ED on 9/8 and given fluid and sent home.  She felt better the following day but then symptoms started again.  She has not been able to tolerate any p.o. intake.  Her vomit is bright yellow in color.  She is not having any abdominal pain.  She had one episode of diarrhea this morning.  No dysuria, urinary frequency, or urgency.  No fevers or chills.  No chest pain, shortness of breath, or cough.  No other complaints.  ED Course: Vital signs stable.  White count 12.4.  Lipase and LFTs normal.  UA with large amount of leukocytes and greater than 50 WBCs on microscopic examination.  Urine culture pending.  SARS-CoV-2 test pending.  CT abdomen pelvis with findings concerning for small bowel obstruction with transition point in the left lower quadrant, likely secondary to adhesions.  No pneumatosis or portal venous gas.  Likely reactive edematous mesenteric haziness.  No large collection or free fluid. Patient received antiemetics, ceftriaxone, and 2 L IV fluid boluses in the ED. General surgery consulted.  NG tube to be placed in the ED.  Review of Systems:  All systems reviewed and apart from history of presenting illness, are negative.  Past Medical History:  Diagnosis Date  . Anxiety   . Back pain, chronic   .  Complication of anesthesia    PT HAS POST OP URINARY RETENTION:  per patient "difficult stick- needs central line or picc"  04-06-2017 AT [facility] IV STARTED BY CRNA NO ISSUE  . COPD with emphysema ([place])   . DDD (degenerative disc disease), cervical    POST MULTIPLE SURGERY'S  . DDD (degenerative disc disease), lumbosacral    POST MULTIPLE SURGERY'S  . Difficult intravenous access   . Fecal incontinence    once a week----  TREATED W/ SACRAL NERVE STIMULATOR PLACED 12/ 2018  . Heart murmur   . History of chronic bronchitis   . History of spinal cord injury 02/09/2007   PT FELL-- S/P  CERVICAL SPINE SURGERY  . History of urinary retention    PT HAS POST OP URINARY RETENTION DUE TO ANESTHESIA  . History of venomous snake bite 2013   RIGHT MIDDLE FINGER BY COOPERHEAD--- TREATMENT W/ ANTIVENOM  . Hyperlipemia   . IBS (irritable bowel syndrome)    flairs up occasionally esp if stressed  . Incomplete right bundle branch block   . Major depression   . Mild carotid artery disease [facility] [place]) cardiologist-  dr Claiborne Billings   mild bilateral ICA per duplex 04-12-2017  1-39%  . MVP (mitral valve prolapse)    no evidence stenosis or regurgitation per last echo 10-25-2015  . OA (osteoarthritis)   . PONV (postoperative nausea and vomiting)   . Pre-diabetes   . PSVT (paroxysmal supraventricular tachycardia) [facility])    cardiologist-- dr Claiborne Billings  Past Surgical History:  Procedure Laterality Date  . ABDOMINAL RECTOPEXY  2011       dr fuller  ([place])   prolapse rectum  . ANAL RECTAL MANOMETRY N/A 03/01/2017   Procedure: ANO RECTAL MANOMETRY;  Surgeon: Thomas, Alicia, MD;  Location: [facility] ENDOSCOPY;  Service: Endoscopy;  Laterality: N/A;  . ANTERIOR CERVICAL DECOMP/DISCECTOMY FUSION  05-28-2000    dr nitka   C3-4  &  C7-T1  . ANTERIOR CERVICAL DECOMPRESSION LAMINECTOMY AND FUSION  02-09-2007    dr jenkins   decompression/ laminectomy C3-4 & C5-6;  laminectomy C2; fusion C2-5  . BREAST SURGERY  1976   breast  reduction  . COLONOSCOPY    . FORAMINOTOMY 1 LEVEL  11-03-1999   dr nitka  [facility]   left C7-T1 w/ decompression left C8  . KNEE SURGERY  AGE 18   bone spur    . LUMBAR LAMINECTOMY/ DECOMPRESSION WITH MET-RX  02-09-2005  dr nikta   L2-3 laminectomy left cage; fusion L2-4  . POSTERIOR CERVICAL FUSION/FORAMINOTOMY  12-08-2000    dr nitka   right forminotomy C7-T1 and posterior fusion  . RIGHT THUMB COLLATERAL LIGAMENT RECONSTRUCTION  02-08-2003   dr gramig  . RIGHT THUMB LIGAMENT REPAIR, ARTHROTOMY, SYNOVECTOMY  10-26-2002    dr gramig  . SPINE SURGERY  1983 to 01/2007   total 14 surgery's  cervical and lumbar  (including fusion's, diskectomy, laminectomy's, foraminotomy's)  . TONSILLECTOMY    . TOTAL HIP ARTHROPLASTY Right 07/31/2014   Procedure: TOTAL HIP ARTHROPLASTY ANTERIOR APPROACH;  Surgeon: Timothy D Murphy, MD;  Location: [facility] OR;  Service: Orthopedics;  Laterality: Right;  . TOTAL HIP ARTHROPLASTY Left 11/19/2015   Procedure: TOTAL HIP ARTHROPLASTY ANTERIOR APPROACH;  Surgeon: Timothy D Murphy, MD;  Location: [facility] OR;  Service: Orthopedics;  Laterality: Left;  . TRANSTHORACIC ECHOCARDIOGRAM  10-25-2015   dr t. kelly   ef 60-655/  no evidence MV stenosis or regurgitation/    . TUBAL LIGATION       reports that she quit smoking about 17 months ago. Her smoking use included cigarettes. She has a 31.50 pack-year smoking history. She has never used smokeless tobacco. She reports current alcohol use of about 5.0 - 6.0 standard drinks of alcohol per week. She reports that she does not use drugs.  Allergies  Allergen Reactions  . Bupropion Hcl Swelling  . Celecoxib Swelling  . Naproxen Swelling  . Methadone Nausea And Vomiting  . Morphine Itching    Family History  Problem Relation Age of Onset  . COPD Father   . Diabetes Mother   . Heart disease Mother   . Kidney disease Mother   . Hypertension Mother     Prior to Admission medications   Medication Sig Start Date End Date Taking?  Authorizing Provider  atenolol (TENORMIN) 25 MG tablet Take 0.5 tablets (12.5 mg total) by mouth 2 (two) times daily. 06/09/18  Yes Kelly, Thomas A, MD  aspirin EC 81 MG tablet Take 81 mg by mouth at bedtime.    [provider]  atorvastatin (LIPITOR) 10 MG tablet Take 1 tablet (10 mg total) by mouth daily. Patient taking differently: Take 10 mg by mouth daily at 6 PM.  10/27/18   Meng, Hao, PA  baclofen (LIORESAL) 20 MG tablet Take 20 mg by mouth 3 (three) times daily as needed for muscle spasms.     [provider]  bismuth subsalicylate (PEPTO BISMOL) 262 MG/15ML suspension Take 30 mLs by mouth every 6 (  six) hours as needed for indigestion.    [provider]  Calcium Carbonate-Vitamin D (CALCIUM + D PO) Take 1 tablet by mouth 2 (two) times daily. Calcium Magnesium Zinc and Vitamin D    [provider]  clonazePAM (KLONOPIN) 1 MG tablet Take 1 mg by mouth at bedtime.    [provider]  dimenhyDRINATE (DRAMAMINE) 50 MG tablet Take 25 mg by mouth every 8 (eight) hours as needed for nausea.    [provider]  fish oil-omega-3 fatty acids 1000 MG capsule Take 1 g by mouth at bedtime.     [provider]  Multiple Vitamin (MULTIVITAMIN WITH MINERALS) TABS tablet Take 1 tablet by mouth daily.    [provider]  omeprazole (PRILOSEC) 20 MG capsule Take 20 mg by mouth daily. 10/25/18   [provider]  ondansetron (ZOFRAN) 4 MG tablet Take 1 tablet (4 mg total) by mouth every 6 (six) hours as needed for nausea or vomiting. 5/0/35   Delora Fuel, MD  verapamil (CALAN-SR) 240 MG CR tablet TAKE 1 TABLET BY MOUTH DAILY AT BEDTIME Patient taking differently: Take 240 mg by mouth at bedtime.  06/09/18   Troy Sine, MD    Physical Exam: Vitals:   01/07/19 2000 01/07/19 2015 01/07/19 2230 01/08/19 0000  BP: (!) 135/55 (!) 122/54 (!) 120/42 (!) 110/48  Pulse: 99 94 89 89  Resp:    16  Temp:      TempSrc:      SpO2: 92%  92% 91% 92%  Weight:      Height:        Physical Exam  Constitutional: She is oriented to person, place, and time. She appears well-developed and well-nourished. No distress.  HENT:  Head: Normocephalic.  Dry mucous membranes  Eyes: Right eye exhibits no discharge. Left eye exhibits no discharge.  Neck: Neck supple.  Cardiovascular: Normal rate, regular rhythm and intact distal pulses.  Pulmonary/Chest: Effort normal and breath sounds normal. No respiratory distress. She has no wheezes. She has no rales.  Abdominal: Soft. Bowel sounds are normal. She exhibits no distension. There is abdominal tenderness. There is no rebound and no guarding.  Suprapubic tenderness  Musculoskeletal:        General: No edema.  Neurological: She is alert and oriented to person, place, and time.  Skin: Skin is warm and dry. She is not diaphoretic.     Labs on Admission: I have personally reviewed following labs and imaging studies  CBC: Recent Labs  Lab 01/03/19 1455 01/07/19 2136  WBC 9.7 12.4*  HGB 17.6* 14.8  HCT 52.4* 44.9  MCV 99.6 101.6*  PLT 211 465   Basic Metabolic Panel: Recent Labs  Lab 01/03/19 1455 01/07/19 2136  NA 138 137  K 3.7 3.9  CL 95* 100  CO2 29 24  GLUCOSE 113* 82  BUN 37* 22  CREATININE 1.06* 0.88  CALCIUM 9.8 8.8*   GFR: Estimated Creatinine Clearance: 45.7 mL/min (by C-G formula based on SCr of 0.88 mg/dL). Liver Function Tests: Recent Labs  Lab 01/03/19 1455 01/07/19 2136  AST 28 40  ALT 25 42  ALKPHOS 86 68  BILITOT 1.4* 1.2  PROT 7.0 5.4*  ALBUMIN 4.0 3.1*   Recent Labs  Lab 01/03/19 1455 01/07/19 2136  LIPASE 37 48   No results for input(s): AMMONIA in the last 168 hours. Coagulation Profile: No results for input(s): INR, PROTIME in the last 168 hours. Cardiac Enzymes: No results for  input(s): CKTOTAL, CKMB, CKMBINDEX, TROPONINI in the last 168 hours. BNP (last 3 results) No results for input(s): PROBNP in the last 8760 hours.  HbA1C: No results for input(s): HGBA1C in the last 72 hours. CBG: No results for input(s): GLUCAP in the last 168 hours. Lipid Profile: No results for input(s): CHOL, HDL, LDLCALC, TRIG, CHOLHDL, LDLDIRECT in the last 72 hours. Thyroid Function Tests: No results for input(s): TSH, T4TOTAL, FREET4, T3FREE, THYROIDAB in the last 72 hours. Anemia Panel: No results for input(s): VITAMINB12, FOLATE, FERRITIN, TIBC, IRON, RETICCTPCT in the last 72 hours. Urine analysis:    Component Value Date/Time   COLORURINE AMBER (A) 01/07/2019 2200   APPEARANCEUR CLOUDY (A) 01/07/2019 2200   LABSPEC 1.020 01/07/2019 2200   PHURINE 5.0 01/07/2019 2200   GLUCOSEU NEGATIVE 01/07/2019 2200   HGBUR NEGATIVE 01/07/2019 2200   BILIRUBINUR NEGATIVE 01/07/2019 2200   KETONESUR 80 (A) 01/07/2019 2200   PROTEINUR 30 (A) 01/07/2019 2200   UROBILINOGEN 0.2 07/18/2014 1115   NITRITE NEGATIVE 01/07/2019 2200   LEUKOCYTESUR LARGE (A) 01/07/2019 2200    Radiological Exams on Admission: Ct Abdomen Pelvis W Contrast  Result Date: 01/07/2019 CLINICAL DATA:  Acute generalized abdominal pain EXAM: CT ABDOMEN AND PELVIS WITH CONTRAST TECHNIQUE: Multidetector CT imaging of the abdomen and pelvis was performed using the standard protocol following bolus administration of intravenous contrast. CONTRAST:  80mL OMNIPAQUE IOHEXOL 300 MG/ML  SOLN COMPARISON:  Most recent comparison CT 05/13/2018 FINDINGS: Lower chest: Marked emphysematous changes are present in the lung bases. Hepatobiliary: No concerning hepatic lesions. "Rosary sign" involving the tip of the gallbladder compatible with adenomyomatosis. No other gallbladder abnormality. No pericholecystic inflammation. Mild dilatation of the extrahepatic biliary tree is greater than expected for patient age but is unchanged prior CTs. Pancreas: Atrophic appearance of the pancreas. No visible pancreatic lesions. No pancreatic ductal dilatation. No peripancreatic inflammation.  Spleen: Normal in size without focal abnormality. Adrenals/Urinary Tract: Insert adrenal glands. Few right renal sinus cysts. Otherwise normal appearance of the kidneys on both portal venous and excretory phase imaging. Urinary bladder is poorly visualized due to extensive streak artifact. Stomach/Bowel: There is fluid distention of the proximal stomach and duodenal sweep as well as the proximal small bowel which demonstrates mural thickening and some anti dependent gas within the valvulae. No definite pneumatosis. Focal transition point is present left lower quadrant (3/45 more distal small bowel is decompressed. Inspissated fecal material remains present throughout the colon. Distal colonic anastomosis appears patent. No colonic dilatation or wall thickening. Vascular/Lymphatic: Extensive atherosclerotic calcifications present throughout evaluation for abdominal adenopathy is somewhat limited given a paucity of intraperitoneal fat. No definite adenopathy is evident. Reproductive: Suboptimal evaluation of the pelvis given extensive streak artifact from patient's hip prostheses and sacral nerve stimulator. Other: Some edematous increase in attenuation of the mid mesentery. No definite free fluid though poorly evaluated given obscuration of much of the pelvis. No free intraperitoneal air. Musculoskeletal: Bilateral total hip arthroplasties are in expected position without convincing feature of hardware complication. There is L2-L4 posterior spinal fusion with adjacent segment disease at L1-2. Marked levocurvature of the spine is centered at the L2 level. No suspicious osseous lesions are evident. Sacral nerve stimulator battery pack is position in the left gluteal soft tissues and enters the right third sacral foramen. IMPRESSION: Imaging quality is degraded by extensive streak artifact from patient's spinal hardware and bilateral hip prostheses. Findings concerning for a small-bowel obstruction with transition point  in the left lower quadrant, likely secondary to   adhesions. No pneumatosis or portal venous gas. Likely reactive edematous mesenteric haziness. No large collection of free fluid though difficult to assess given streak in the pelvis. 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 Evidence of prior distal colectomy.  Patent anastomosis. Bilateral total hip arthroplasties in expected positioning without evidence of acute complication. Sacral nerve stimulator. Aortic Atherosclerosis (ICD10-I70.0). Emphysema (ICD10-J43.9). Electronically Signed   By: Lovena Le M.D.   On: 01/07/2019 23:44    Assessment/Plan Principal Problem:   SBO (small bowel obstruction) (HCC) Active Problems:   COPD (chronic obstructive pulmonary disease) (HCC)   UTI (urinary tract infection)   SBO Patient presenting with a one-week history of intractable nausea and vomiting.  CT abdomen pelvis with findings concerning for small bowel obstruction with transition point in the left lower quadrant, likely secondary to adhesions. -General surgery consulted -NG tube to be placed -IV fluid hydration -IV antiemetic -Keep n.p.o. -Monitor electrolytes  UTI Afebrile.  White count 12.4.  Vital signs stable.  No signs of sepsis. UA with large amount of leukocytes and greater than 50 WBCs on microscopic examination. -Ceftriaxone -Urine culture pending  COPD -Stable.  No wheezing or shortness of breath.  DuoNebs every 6 hours as needed.  DVT prophylaxis: Lovenox Code Status: Full code Family Communication: No family available. Disposition Plan: Anticipate discharge after clinical improvement. Consults called: None  Admission status: It is my clinical opinion that admission to INPATIENT is reasonable and necessary in this 68 y.o. female . presenting with symptoms of intractable nausea and vomiting, unable to tolerate p.o. intake, and found to have SBO on CT.  General surgery following, NG tube has  been placed.  Also has a UTI.  Anticipate she will be in the hospital for several days.  Given the aforementioned, the predictability of an adverse outcome is felt to be significant. I expect that the patient will require at least 2 midnights in the hospital to treat this condition.  The medical decision making on this patient was of high complexity and the patient is at high risk for clinical deterioration, therefore this is a level 3 visit.  Shela Leff MD [facility] Pager [phone]  If 7PM-7AM, please contact night-coverage [email or web address] Password [facility]  01/08/2019, 12:47 AM

## 2019-01-08 NOTE — Progress Notes (Signed)
Discontinued NG tube from patient. Patient tolerated well.   Farley Ly RN

## 2019-01-08 NOTE — Plan of Care (Signed)
  Problem: Education: Goal: Knowledge of General Education information will improve Description Including pain rating scale, medication(s)/side effects and non-pharmacologic comfort measures Outcome: Progressing   

## 2019-01-08 NOTE — ED Notes (Signed)
Pt has had several episodes of diarrhea, pt has been getting herself to the bedside commode.

## 2019-01-08 NOTE — Consult Note (Signed)
Reason for Consult:Small Bowel Obstruction Referring Physician: Dr. Delora Fuel  [name] is an 68 y.o. female.  HPI:   [name] is a 68 yo female with a history of COPD, TIA, SVT, and prolapsed rectum who presents to the ED with a 1 week history of emesis.  She reports that the emesis has been occurring several times daily and is yellow-green in color.  She denies associated fevers, chills or abdominal pain.  She does report a history of frequent constipation, and surgical history of tubal ligation and abdominal rectopexy.  She does report one episode of diarrhea today.    ED evaluation consistent with WBC of 12.4, and abnormal urine culture consistent with concurrent UTI.  CT abdomen pelvis demonstrates large stool burden, and mural thickening of small bowel with transition point in LLQ consistent with small bowel obstruction with adhesions.    Past Medical History:  Diagnosis Date  . Anxiety   . Back pain, chronic   . Complication of anesthesia    PT HAS POST OP URINARY RETENTION:  per patient "difficult stick- needs central line or picc"  04-06-2017 AT [facility] IV STARTED BY CRNA NO ISSUE  . COPD with emphysema ([place])   . DDD (degenerative disc disease), cervical    POST MULTIPLE SURGERY'S  . DDD (degenerative disc disease), lumbosacral    POST MULTIPLE SURGERY'S  . Difficult intravenous access   . Fecal incontinence    once a week----  TREATED W/ SACRAL NERVE STIMULATOR PLACED 12/ 2018  . Heart murmur   . History of chronic bronchitis   . History of spinal cord injury 02/09/2007   PT FELL-- S/P  CERVICAL SPINE SURGERY  . History of urinary retention    PT HAS POST OP URINARY RETENTION DUE TO ANESTHESIA  . History of venomous snake bite 2013   RIGHT MIDDLE FINGER BY COOPERHEAD--- TREATMENT W/ ANTIVENOM  . Hyperlipemia   . IBS (irritable bowel syndrome)    flairs up occasionally esp if stressed  . Incomplete right bundle branch block   . Major depression   . Mild  carotid artery disease [facility]) cardiologist-  dr Claiborne Billings   mild bilateral ICA per duplex 04-12-2017  1-39%  . MVP (mitral valve prolapse)    no evidence stenosis or regurgitation per last echo 10-25-2015  . OA (osteoarthritis)   . PONV (postoperative nausea and vomiting)   . Pre-diabetes   . PSVT (paroxysmal supraventricular tachycardia) [facility])    cardiologist-- dr Claiborne Billings    Past Surgical History:  Procedure Laterality Date  . ABDOMINAL RECTOPEXY  2011       dr fuller  [facility])   prolapse rectum  . ANAL RECTAL MANOMETRY N/A 03/01/2017   Procedure: ANO RECTAL MANOMETRY;  Surgeon: Leighton Ruff, MD;  Location: [facility];  Service: Endoscopy;  Laterality: N/A;  . ANTERIOR CERVICAL DECOMP/DISCECTOMY FUSION  05-28-2000    dr Louanne Skye   C3-4  &  C7-T1  . ANTERIOR CERVICAL DECOMPRESSION LAMINECTOMY AND FUSION  02-09-2007    dr Arnoldo Morale   decompression/ laminectomy C3-4 & C5-6;  laminectomy C2; fusion C2-5  . BREAST SURGERY  1976   breast reduction  . COLONOSCOPY    . FORAMINOTOMY 1 LEVEL  11-03-1999   dr Louanne Skye  [facility]   left C7-T1 w/ decompression left C8  . KNEE SURGERY  AGE 45   bone spur    . LUMBAR LAMINECTOMY/ DECOMPRESSION WITH MET-RX  02-09-2005  dr Flavia Shipper   L2-3 laminectomy left cage;  fusion L2-4  . POSTERIOR CERVICAL FUSION/FORAMINOTOMY  12-08-2000    dr Louanne Skye   right forminotomy C7-T1 and posterior fusion  . RIGHT THUMB COLLATERAL LIGAMENT RECONSTRUCTION  02-08-2003   dr Amedeo Plenty  . RIGHT THUMB LIGAMENT REPAIR, ARTHROTOMY, SYNOVECTOMY  10-26-2002    dr Amedeo Plenty  . [place] to 01/2007   total 14 surgery's  cervical and lumbar  (including fusion's, diskectomy, laminectomy's, foraminotomy's)  . TONSILLECTOMY    . TOTAL HIP ARTHROPLASTY Right 07/31/2014   Procedure: TOTAL HIP ARTHROPLASTY ANTERIOR APPROACH;  Surgeon: Renette Butters, MD;  Location: [place];  Service: Orthopedics;  Laterality: Right;  . TOTAL HIP ARTHROPLASTY Left 11/19/2015   Procedure: TOTAL HIP ARTHROPLASTY  ANTERIOR APPROACH;  Surgeon: Renette Butters, MD;  Location: [place];  Service: Orthopedics;  Laterality: Left;  . TRANSTHORACIC ECHOCARDIOGRAM  10-25-2015   dr t. Claiborne Billings   ef 60-655/  no evidence MV stenosis or regurgitation/    . TUBAL LIGATION      Family History  Problem Relation Age of Onset  . COPD Father   . Diabetes Mother   . Heart disease Mother   . Kidney disease Mother   . Hypertension Mother     Social History:  reports that she quit smoking about 17 months ago. Her smoking use included cigarettes. She has a 31.50 pack-year smoking history. She has never used smokeless tobacco. She reports current alcohol use of about 5.0 - 6.0 standard drinks of alcohol per week. She reports that she does not use drugs.  Allergies:  Allergies  Allergen Reactions  . Bupropion Hcl Swelling  . Celecoxib Swelling  . Naproxen Swelling  . Methadone Nausea And Vomiting  . Morphine Itching    Medications: I have reviewed the patient's current medications.  Results for orders placed or performed during the hospital encounter of 01/07/19 (from the past 48 hour(s))  Lipase, blood     Status: None   Collection Time: 01/07/19  9:36 PM  Result Value Ref Range   Lipase 48 11 - 51 U/L    Comment: Performed at [facility], [place] [address], [place], [place] [address]  Comprehensive metabolic panel     Status: Abnormal   Collection Time: 01/07/19  9:36 PM  Result Value Ref Range   Sodium 137 135 - 145 mmol/L   Potassium 3.9 3.5 - 5.1 mmol/L   Chloride 100 98 - 111 mmol/L   CO2 24 22 - 32 mmol/L   Glucose, Bld 82 70 - 99 mg/dL   BUN 22 8 - 23 mg/dL   Creatinine, Ser 0.88 0.44 - 1.00 mg/dL   Calcium 8.8 (L) 8.9 - 10.3 mg/dL   Total Protein 5.4 (L) 6.5 - 8.1 g/dL   Albumin 3.1 (L) 3.5 - 5.0 g/dL   AST 40 15 - 41 U/L   ALT 42 0 - 44 U/L   Alkaline Phosphatase 68 38 - 126 U/L   Total Bilirubin 1.2 0.3 - 1.2 mg/dL   GFR calc non Af Amer >60 >60 mL/min   GFR calc Af Amer >60 >60 mL/min    Anion gap 13 5 - 15    Comment: Performed at [facility] [address] [place] [address]  CBC     Status: Abnormal   Collection Time: 01/07/19  9:36 PM  Result Value Ref Range   WBC 12.4 (H) 4.0 - 10.5 K/uL   RBC 4.42 3.87 - 5.11 MIL/uL  Hemoglobin 14.8 12.0 - 15.0 g/dL   HCT 44.9 36.0 - 46.0 %   MCV 101.6 (H) 80.0 - 100.0 fL   MCH 33.5 26.0 - 34.0 pg   MCHC 33.0 30.0 - 36.0 g/dL   RDW 12.2 11.5 - 15.5 %   Platelets 176 150 - 400 K/uL   nRBC 0.0 0.0 - 0.2 %    Comment: Performed at [facility] [address] [place], [place] [address]  Urinalysis, Routine w reflex microscopic     Status: Abnormal   Collection Time: 01/07/19 10:00 PM  Result Value Ref Range   Color, Urine AMBER (A) YELLOW    Comment: BIOCHEMICALS MAY BE AFFECTED BY COLOR   APPearance CLOUDY (A) CLEAR   Specific Gravity, Urine 1.020 1.005 - 1.030   pH 5.0 5.0 - 8.0   Glucose, UA NEGATIVE NEGATIVE mg/dL   Hgb urine dipstick NEGATIVE NEGATIVE   Bilirubin Urine NEGATIVE NEGATIVE   Ketones, ur 80 (A) NEGATIVE mg/dL   Protein, ur 30 (A) NEGATIVE mg/dL   Nitrite NEGATIVE NEGATIVE   Leukocytes,Ua LARGE (A) NEGATIVE   RBC / HPF 0-5 0 - 5 RBC/hpf   WBC, UA >50 (H) 0 - 5 WBC/hpf   Bacteria, UA RARE (A) NONE SEEN   Squamous Epithelial / LPF 0-5 0 - 5   Mucus PRESENT    Non Squamous Epithelial 0-5 (A) NONE SEEN    Comment: Performed at [facility], [place] [address] [place], [place] [address]    Ct Abdomen Pelvis W Contrast  Result Date: 01/07/2019 CLINICAL DATA:  Acute generalized abdominal pain EXAM: CT ABDOMEN AND PELVIS WITH CONTRAST TECHNIQUE: Multidetector CT imaging of the abdomen and pelvis was performed using the standard protocol following bolus administration of intravenous contrast. CONTRAST:  45m OMNIPAQUE IOHEXOL 300 MG/ML  SOLN COMPARISON:  Most recent comparison CT 05/13/2018 FINDINGS: Lower chest: Marked emphysematous changes are present in the lung bases. Hepatobiliary:  No concerning hepatic lesions. "Rosary sign" involving the tip of the gallbladder compatible with adenomyomatosis. No other gallbladder abnormality. No pericholecystic inflammation. Mild dilatation of the extrahepatic biliary tree is greater than expected for patient age but is unchanged prior CTs. Pancreas: Atrophic appearance of the pancreas. No visible pancreatic lesions. No pancreatic ductal dilatation. No peripancreatic inflammation. Spleen: Normal in size without focal abnormality. Adrenals/Urinary Tract: Insert adrenal glands. Few right renal sinus cysts. Otherwise normal appearance of the kidneys on both portal venous and excretory phase imaging. Urinary bladder is poorly visualized due to extensive streak artifact. Stomach/Bowel: There is fluid distention of the proximal stomach and duodenal sweep as well as the proximal small bowel which demonstrates mural thickening and some anti dependent gas within the valvulae. No definite pneumatosis. Focal transition point is present left lower quadrant (3/45 more distal small bowel is decompressed. Inspissated fecal material remains present throughout the colon. Distal colonic anastomosis appears patent. No colonic dilatation or wall thickening. Vascular/Lymphatic: Extensive atherosclerotic calcifications present throughout evaluation for abdominal adenopathy is somewhat limited given a paucity of intraperitoneal fat. No definite adenopathy is evident. Reproductive: Suboptimal evaluation of the pelvis given extensive streak artifact from patient's hip prostheses and sacral nerve stimulator. Other: Some edematous increase in attenuation of the mid mesentery. No definite free fluid though poorly evaluated given obscuration of much of the pelvis. No free intraperitoneal air. Musculoskeletal: Bilateral total hip arthroplasties are in expected position without convincing feature of hardware complication. There is L2-L4 posterior spinal fusion with adjacent segment  disease at  L1-2. Marked levocurvature of the spine is centered at the L2 level. No suspicious osseous lesions are evident. Sacral nerve stimulator battery pack is position in the left gluteal soft tissues and enters the right third sacral foramen. IMPRESSION: Imaging quality is degraded by extensive streak artifact from patient's spinal hardware and bilateral hip prostheses. Findings concerning for a small-bowel obstruction with transition point in the left lower quadrant, likely secondary to adhesions. No pneumatosis or portal venous gas. Likely reactive edematous mesenteric haziness. No large collection of free fluid though difficult to assess given streak in the pelvis. 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 Evidence of prior distal colectomy.  Patent anastomosis. Bilateral total hip arthroplasties in expected positioning without evidence of acute complication. Sacral nerve stimulator. Aortic Atherosclerosis (ICD10-I70.0). Emphysema (ICD10-J43.9). Electronically Signed   By: Lovena Le M.D.   On: 01/07/2019 23:44    Review of Systems  Constitutional: Negative for chills and fever.  Gastrointestinal: Positive for constipation, nausea and vomiting.  Genitourinary: Positive for dysuria, frequency and urgency.  All other systems reviewed and are negative.  Blood pressure (!) 110/48, pulse 89, temperature 99 F (37.2 C), temperature source Oral, resp. rate 14, height 5' 7" (1.702 m), weight 45.4 kg, SpO2 92 %. Physical Exam  Constitutional: She is oriented to person, place, and time. She appears well-developed and well-nourished. No distress.  Cardiovascular: Normal rate, regular rhythm and normal heart sounds.  Respiratory: Effort normal and breath sounds normal. No respiratory distress.  GI: Soft. Bowel sounds are normal. She exhibits no distension. There is no abdominal tenderness.  Neurological: She is alert and oriented to person, place, and time. She  has normal reflexes.  Skin: Skin is warm and dry. There is erythema (on periumbilical abdomen).  Psychiatric: She has a normal mood and affect. Her behavior is normal. Judgment and thought content normal.      Assessment/Plan: Patient is a 68 yo female with emesis and CT imaging consistent with small bowel obstruction.  - Patient will be NPO for bowel rest - NG tube placement - Patient will be monitored on floor.    Suzanna Obey 01/08/2019, 12:37 AM

## 2019-01-08 NOTE — Progress Notes (Signed)
PROGRESS NOTE                                                                                                                                                                                                             Patient Demographics:    [name], is a 68 y.o. female, DOB - May 31, 1950, AB:[id number]  Admit date - 01/07/2019   Admitting Physician Shela Leff, MD  Outpatient Primary MD for the patient is Raina Mina., MD  LOS - 0  Chief Complaint  Patient presents with  . Emesis       Brief Narrative  [name] is a 68 y.o. female with medical history significant of COPD, DDD, spinal cord injury, hyperlipidemia, IBS, incomplete RBBB, depression, anxiety, carotid artery disease, PSVT presenting to the hospital for evaluation of nausea and vomiting.  Patient was seen in the ED on 9/8 for nausea and vomiting and was diagnosed with gastroenteritis at that time, further workup showed SBO.   Subjective:    [name] today has, No headache, No chest pain, No abdominal pain - No Nausea, No new weakness tingling or numbness, No Cough - SOB.    Assessment  & Plan :     1. SBO  - likely due to adhesions from previous surgery several years ago for rectal prolapse, continue bowel rest, NG tube, IV fluids and supportive care.  Is passing some flatus.  CCS on board.  Continue to monitor.  2.  COPD.  Stable no acute issues.  Supportive care.  3.  UTI.  On Rocephin, follow cultures.  4.  History of PSVT.  For now PRN IV Lopressor, resume home medication Calan SR and atenolol once able to take p.o.  5.  Carotid artery disease.  Stable, aspirin suppository for now, once able to take p.o. resume Lipitor.  6.  Anxiety.  PRN Ativan.  7.  GERD.  IV PPI for now.    Family Communication  :  Husband - 01/08/19 @ 9.30am, no response  Code Status :  Full  Disposition Plan  :  INpt  Consults  :  CCS  Procedures  :    CT - Imaging quality is degraded by  extensive streak artifact from patient's spinal hardware and bilateral hip prostheses. Findings concerning for a small-bowel obstruction with transition point in the left lower quadrant, likely secondary to adhesions. No pneumatosis or portal venous gas. Likely reactive edematous mesenteric haziness. No large collection of free fluid though difficult  to assess given streak in the pelvis. 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 Evidence of prior distal colectomy.  Patent anastomosis. Bilateral total hip arthroplasties in expected positioning without evidence of acute complication. Sacral nerve stimulator. Aortic Atherosclerosis.  DVT Prophylaxis  :  Lovenox    Lab Results  Component Value Date   PLT 150 01/08/2019    Diet :  Diet Order            Diet NPO time specified  Diet effective now               Inpatient Medications Scheduled Meds: . enoxaparin (LOVENOX) injection  30 mg Subcutaneous Daily   Continuous Infusions: . cefTRIAXone (ROCEPHIN)  IV    . dextrose 5 % with KCl 20 mEq / L 20 mEq (01/08/19 0829)   PRN Meds:.acetaminophen **OR** acetaminophen, diphenhydrAMINE, ipratropium-albuterol, morphine injection, ondansetron (ZOFRAN) IV  Antibiotics  :   Anti-infectives (From admission, onward)   Start     Dose/Rate Route Frequency Ordered Stop   01/08/19 2200  cefTRIAXone (ROCEPHIN) 1 g in sodium chloride 0.9 % 100 mL IVPB     1 g 200 mL/hr over 30 Minutes Intravenous Every 24 hours 01/08/19 0038     01/07/19 2315  cefTRIAXone (ROCEPHIN) 1 g in sodium chloride 0.9 % 100 mL IVPB     1 g 200 mL/hr over 30 Minutes Intravenous  Once 01/07/19 2307 01/08/19 0051          Objective:   Vitals:   01/08/19 0145 01/08/19 0230 01/08/19 0415 01/08/19 0604  BP: (!) 137/59 (!) 117/52 (!) 112/51 128/67  Pulse: 81 81 83 92  Resp:  16 17 16   Temp:   98.4 F (36.9 C) 98.2 F (36.8 C)  TempSrc:   Oral Oral  SpO2: 96% 95% 95% 99%  Weight:       Height:        Wt Readings from Last 3 Encounters:  01/07/19 45.4 kg  11/23/18 45.4 kg  10/27/18 45.5 kg     Intake/Output Summary (Last 24 hours) at 01/08/2019 0932 Last data filed at 01/08/2019 0600 Gross per 24 hour  Intake 1150 ml  Output 850 ml  Net 300 ml     Physical Exam  Awake Alert, Oriented X 3, No new F.N deficits, Normal affect [place].AT,PERRAL Supple Neck,No JVD, No cervical lymphadenopathy appriciated.  Symmetrical Chest wall movement, Good air movement bilaterally, CTAB RRR,No Gallops,Rubs or new Murmurs, No Parasternal Heave NG in place, +ve B.Sounds, Abd Soft, No tenderness, No organomegaly appriciated, No rebound - guarding or rigidity. No Cyanosis, Clubbing or edema, No new Rash or bruise       Data Review:    CBC Recent Labs  Lab 01/03/19 1455 01/07/19 2136 01/08/19 0210  WBC 9.7 12.4* 10.8*  HGB 17.6* 14.8 15.4*  HCT 52.4* 44.9 47.0*  PLT 211 176 150  MCV 99.6 101.6* 101.5*  MCH 33.5 33.5 33.3  MCHC 33.6 33.0 32.8  RDW 12.4 12.2 12.2    Chemistries  Recent Labs  Lab 01/03/19 1455 01/07/19 2136 01/08/19 0210  NA 138 137 138  K 3.7 3.9 3.9  CL 95* 100 105  CO2 29 24 21*  GLUCOSE 113* 82 88  BUN 37* 22 20  CREATININE 1.06* 0.88 0.77  CALCIUM 9.8 8.8* 8.6*  MG  --   --  1.9  AST 28 40  --   ALT 25 42  --   ALKPHOS 86 68  --  BILITOT 1.4* 1.2  --    ------------------------------------------------------------------------------------------------------------------ No results for input(s): CHOL, HDL, LDLCALC, TRIG, CHOLHDL, LDLDIRECT in the last 72 hours.  No results found for: HGBA1C ------------------------------------------------------------------------------------------------------------------ No results for input(s): TSH, T4TOTAL, T3FREE, THYROIDAB in the last 72 hours.  Invalid input(s): FREET3 ------------------------------------------------------------------------------------------------------------------ No results  for input(s): VITAMINB12, FOLATE, FERRITIN, TIBC, IRON, RETICCTPCT in the last 72 hours.  Coagulation profile No results for input(s): INR, PROTIME in the last 168 hours.  No results for input(s): DDIMER in the last 72 hours.  Cardiac Enzymes No results for input(s): CKMB, TROPONINI, MYOGLOBIN in the last 168 hours.  Invalid input(s): CK ------------------------------------------------------------------------------------------------------------------ No results found for: BNP  Micro Results Recent Results (from the past 240 hour(s))  SARS Coronavirus 2 [facility] order, Performed in [facility] hospital lab) Nasopharyngeal Nasopharyngeal Swab     Status: None   Collection Time: 01/08/19 12:02 AM   Specimen: Nasopharyngeal Swab  Result Value Ref Range Status   SARS Coronavirus 2 NEGATIVE NEGATIVE Final    Comment: (NOTE) If result is NEGATIVE SARS-CoV-2 target nucleic acids are NOT DETECTED. The SARS-CoV-2 RNA is generally detectable in upper and lower  respiratory specimens during the acute phase of infection. The lowest  concentration of SARS-CoV-2 viral copies this assay can detect is 250  copies / mL. A negative result does not preclude SARS-CoV-2 infection  and should not be used as the sole basis for treatment or other  patient management decisions.  A negative result may occur with  improper specimen collection / handling, submission of specimen other  than nasopharyngeal swab, presence of viral mutation(s) within the  areas targeted by this assay, and inadequate number of viral copies  (<250 copies / mL). A negative result must be combined with clinical  observations, patient history, and epidemiological information. If result is POSITIVE SARS-CoV-2 target nucleic acids are DETECTED. The SARS-CoV-2 RNA is generally detectable in upper and lower  respiratory specimens dur ing the acute phase of infection.  Positive  results are indicative of active infection with  SARS-CoV-2.  Clinical  correlation with patient history and other diagnostic information is  necessary to determine patient infection status.  Positive results do  not rule out bacterial infection or co-infection with other viruses. If result is PRESUMPTIVE POSTIVE SARS-CoV-2 nucleic acids MAY BE PRESENT.   A presumptive positive result was obtained on the submitted specimen  and confirmed on repeat testing.  While 2019 novel coronavirus  (SARS-CoV-2) nucleic acids may be present in the submitted sample  additional confirmatory testing may be necessary for epidemiological  and / or clinical management purposes  to differentiate between  SARS-CoV-2 and other Sarbecovirus currently known to infect humans.  If clinically indicated additional testing with an alternate test  methodology [phone]) is advised. The SARS-CoV-2 RNA is generally  detectable in upper and lower respiratory sp ecimens during the acute  phase of infection. The expected result is Negative. Fact Sheet for Patients:  StrictlyIdeas.no Fact Sheet for Healthcare Providers: BankingDealers.co.za This test is not yet approved or cleared by the [place] FDA and has been authorized for detection and/or diagnosis of SARS-CoV-2 by FDA under an Emergency Use Authorization (EUA).  This EUA will remain in effect (meaning this test can be used) for the duration of the COVID-19 declaration under Section 564(b)(1) of the Act, 21 U.S.C. section 360bbb-3(b)(1), unless the authorization is terminated or revoked sooner. Performed at [facility], [place] [address], [place], [place] [address]  Radiology Reports Ct Abdomen Pelvis W Contrast  Result Date: 01/07/2019 CLINICAL DATA:  Acute generalized abdominal pain EXAM: CT ABDOMEN AND PELVIS WITH CONTRAST TECHNIQUE: Multidetector CT imaging of the abdomen and pelvis was performed using the standard protocol following bolus  administration of intravenous contrast. CONTRAST:  41mL OMNIPAQUE IOHEXOL 300 MG/ML  SOLN COMPARISON:  Most recent comparison CT 05/13/2018 FINDINGS: Lower chest: Marked emphysematous changes are present in the lung bases. Hepatobiliary: No concerning hepatic lesions. "Rosary sign" involving the tip of the gallbladder compatible with adenomyomatosis. No other gallbladder abnormality. No pericholecystic inflammation. Mild dilatation of the extrahepatic biliary tree is greater than expected for patient age but is unchanged prior CTs. Pancreas: Atrophic appearance of the pancreas. No visible pancreatic lesions. No pancreatic ductal dilatation. No peripancreatic inflammation. Spleen: Normal in size without focal abnormality. Adrenals/Urinary Tract: Insert adrenal glands. Few right renal sinus cysts. Otherwise normal appearance of the kidneys on both portal venous and excretory phase imaging. Urinary bladder is poorly visualized due to extensive streak artifact. Stomach/Bowel: There is fluid distention of the proximal stomach and duodenal sweep as well as the proximal small bowel which demonstrates mural thickening and some anti dependent gas within the valvulae. No definite pneumatosis. Focal transition point is present left lower quadrant (3/45 more distal small bowel is decompressed. Inspissated fecal material remains present throughout the colon. Distal colonic anastomosis appears patent. No colonic dilatation or wall thickening. Vascular/Lymphatic: Extensive atherosclerotic calcifications present throughout evaluation for abdominal adenopathy is somewhat limited given a paucity of intraperitoneal fat. No definite adenopathy is evident. Reproductive: Suboptimal evaluation of the pelvis given extensive streak artifact from patient's hip prostheses and sacral nerve stimulator. Other: Some edematous increase in attenuation of the mid mesentery. No definite free fluid though poorly evaluated given obscuration of much of  the pelvis. No free intraperitoneal air. Musculoskeletal: Bilateral total hip arthroplasties are in expected position without convincing feature of hardware complication. There is L2-L4 posterior spinal fusion with adjacent segment disease at L1-2. Marked levocurvature of the spine is centered at the L2 level. No suspicious osseous lesions are evident. Sacral nerve stimulator battery pack is position in the left gluteal soft tissues and enters the right third sacral foramen. IMPRESSION: Imaging quality is degraded by extensive streak artifact from patient's spinal hardware and bilateral hip prostheses. Findings concerning for a small-bowel obstruction with transition point in the left lower quadrant, likely secondary to adhesions. No pneumatosis or portal venous gas. Likely reactive edematous mesenteric haziness. No large collection of free fluid though difficult to assess given streak in the pelvis. 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 Evidence of prior distal colectomy.  Patent anastomosis. Bilateral total hip arthroplasties in expected positioning without evidence of acute complication. Sacral nerve stimulator. Aortic Atherosclerosis (ICD10-I70.0). Emphysema (ICD10-J43.9). Electronically Signed   By: Lovena Le M.D.   On: 01/07/2019 23:44   Dg Abd Portable 1v-small Bowel Protocol-position Verification  Result Date: 01/08/2019 CLINICAL DATA:  Confirmation of nasogastric tube placement. EXAM: PORTABLE ABDOMEN - 1 VIEW COMPARISON:  CT abdomen pelvis 01/07/2019 FINDINGS: Contrast opacification of the stomach and proximal small bowel with tip of the NG tube position at the level of duodenal bulb. A side port is not visualized the suspect this is within the gastric lumen given the significant opacification of the rugal folds. Lumbar fixation hardware is noted. Redemonstration of the chest wall deformity in severe scoliotic curvature of the spine. IMPRESSION: NG tube  tip at the level of duodenal bulb. Electronically Signed  By: Lovena Le M.D.   On: 01/08/2019 03:20    Time Spent in minutes  30   Lala Lund M.D on 01/08/2019 at 9:32 AM  To page go to [email or web address] - password [email or web address]

## 2019-01-08 NOTE — Progress Notes (Signed)
Subjective/Chief Complaint: Now having diarrhea, feels much better   Objective: Vital signs in last 24 hours: Temp:  [98.2 F (36.8 C)-99 F (37.2 C)] 98.2 F (36.8 C) (09/13 0604) Pulse Rate:  [81-99] 92 (09/13 0604) Resp:  [14-17] 16 (09/13 0604) BP: (110-137)/(35-67) 128/67 (09/13 0604) SpO2:  [91 %-99 %] 99 % (09/13 0604) Weight:  [45.4 kg] 45.4 kg (09/12 1633) Last BM Date: 01/08/19  Intake/Output from previous day: 09/12 0701 - 09/13 0700 In: 1150 [I.V.:150; IV Piggyback:1000] Out: 850 [Emesis/NG output:50] Intake/Output this shift: No intake/output data recorded.  GI soft nontender nondistended has bs present  Lab Results:  Recent Labs    01/07/19 2136 01/08/19 0210  WBC 12.4* 10.8*  HGB 14.8 15.4*  HCT 44.9 47.0*  PLT 176 150   BMET Recent Labs    01/07/19 2136 01/08/19 0210  NA 137 138  K 3.9 3.9  CL 100 105  CO2 24 21*  GLUCOSE 82 88  BUN 22 20  CREATININE 0.88 0.77  CALCIUM 8.8* 8.6*   PT/INR No results for input(s): LABPROT, INR in the last 72 hours. ABG No results for input(s): PHART, HCO3 in the last 72 hours.  Invalid input(s): PCO2, PO2  Studies/Results: Ct Abdomen Pelvis W Contrast  Result Date: 01/07/2019 CLINICAL DATA:  Acute generalized abdominal pain EXAM: CT ABDOMEN AND PELVIS WITH CONTRAST TECHNIQUE: Multidetector CT imaging of the abdomen and pelvis was performed using the standard protocol following bolus administration of intravenous contrast. CONTRAST:  52mL OMNIPAQUE IOHEXOL 300 MG/ML  SOLN COMPARISON:  Most recent comparison CT 05/13/2018 FINDINGS: Lower chest: Marked emphysematous changes are present in the lung bases. Hepatobiliary: No concerning hepatic lesions. "Rosary sign" involving the tip of the gallbladder compatible with adenomyomatosis. No other gallbladder abnormality. No pericholecystic inflammation. Mild dilatation of the extrahepatic biliary tree is greater than expected for patient age but is unchanged  prior CTs. Pancreas: Atrophic appearance of the pancreas. No visible pancreatic lesions. No pancreatic ductal dilatation. No peripancreatic inflammation. Spleen: Normal in size without focal abnormality. Adrenals/Urinary Tract: Insert adrenal glands. Few right renal sinus cysts. Otherwise normal appearance of the kidneys on both portal venous and excretory phase imaging. Urinary bladder is poorly visualized due to extensive streak artifact. Stomach/Bowel: There is fluid distention of the proximal stomach and duodenal sweep as well as the proximal small bowel which demonstrates mural thickening and some anti dependent gas within the valvulae. No definite pneumatosis. Focal transition point is present left lower quadrant (3/45 more distal small bowel is decompressed. Inspissated fecal material remains present throughout the colon. Distal colonic anastomosis appears patent. No colonic dilatation or wall thickening. Vascular/Lymphatic: Extensive atherosclerotic calcifications present throughout evaluation for abdominal adenopathy is somewhat limited given a paucity of intraperitoneal fat. No definite adenopathy is evident. Reproductive: Suboptimal evaluation of the pelvis given extensive streak artifact from patient's hip prostheses and sacral nerve stimulator. Other: Some edematous increase in attenuation of the mid mesentery. No definite free fluid though poorly evaluated given obscuration of much of the pelvis. No free intraperitoneal air. Musculoskeletal: Bilateral total hip arthroplasties are in expected position without convincing feature of hardware complication. There is L2-L4 posterior spinal fusion with adjacent segment disease at L1-2. Marked levocurvature of the spine is centered at the L2 level. No suspicious osseous lesions are evident. Sacral nerve stimulator battery pack is position in the left gluteal soft tissues and enters the right third sacral foramen. IMPRESSION: Imaging quality is degraded by  extensive streak artifact from patient's spinal  hardware and bilateral hip prostheses. Findings concerning for a small-bowel obstruction with transition point in the left lower quadrant, likely secondary to adhesions. No pneumatosis or portal venous gas. Likely reactive edematous mesenteric haziness. No large collection of free fluid though difficult to assess given streak in the pelvis. 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 Evidence of prior distal colectomy.  Patent anastomosis. Bilateral total hip arthroplasties in expected positioning without evidence of acute complication. Sacral nerve stimulator. Aortic Atherosclerosis (ICD10-I70.0). Emphysema (ICD10-J43.9). Electronically Signed   By: Lovena Le M.D.   On: 01/07/2019 23:44   Dg Abd Portable 1v-small Bowel Protocol-position Verification  Result Date: 01/08/2019 CLINICAL DATA:  Confirmation of nasogastric tube placement. EXAM: PORTABLE ABDOMEN - 1 VIEW COMPARISON:  CT abdomen pelvis 01/07/2019 FINDINGS: Contrast opacification of the stomach and proximal small bowel with tip of the NG tube position at the level of duodenal bulb. A side port is not visualized the suspect this is within the gastric lumen given the significant opacification of the rugal folds. Lumbar fixation hardware is noted. Redemonstration of the chest wall deformity in severe scoliotic curvature of the spine. IMPRESSION: NG tube tip at the level of duodenal bulb. Electronically Signed   By: Lovena Le M.D.   On: 01/08/2019 03:20    Anti-infectives: Anti-infectives (From admission, onward)   Start     Dose/Rate Route Frequency Ordered Stop   01/08/19 2200  cefTRIAXone (ROCEPHIN) 1 g in sodium chloride 0.9 % 100 mL IVPB     1 g 200 mL/hr over 30 Minutes Intravenous Every 24 hours 01/08/19 0038     01/07/19 2315  cefTRIAXone (ROCEPHIN) 1 g in sodium chloride 0.9 % 100 mL IVPB     1 g 200 mL/hr over 30 Minutes Intravenous  Once  01/07/19 2307 01/08/19 0051      Assessment/Plan: SBO, prior abd surgery -clinically sounds resolved, will check xray -if better will dc ng give clears and advance to fulls as tolerated today likely home in am  Rolm Bookbinder 01/08/2019

## 2019-01-09 ENCOUNTER — Inpatient Hospital Stay (HOSPITAL_COMMUNITY): Payer: BC Managed Care – PPO

## 2019-01-09 LAB — URINE CULTURE: Culture: >=100000 — AB

## 2019-01-09 MED ORDER — MORPHINE SULFATE (PF) 2 MG/ML IV SOLN: 1.0000 mg | INTRAVENOUS | Status: DC | PRN

## 2019-01-09 MED ORDER — ONDANSETRON 4 MG PO TBDP: 4.0000 mg | ORAL_TABLET | Freq: Four times a day (QID) | ORAL | Status: DC | PRN

## 2019-01-09 MED ORDER — SODIUM CHLORIDE 0.9% FLUSH: 10.0000 mL | INTRAVENOUS | Status: DC | PRN

## 2019-01-09 MED ORDER — VERAPAMIL HCL ER 240 MG PO TBCR
240.0000 mg | EXTENDED_RELEASE_TABLET | Freq: Every day | ORAL | Status: DC
  Filled 2019-01-09: qty 1

## 2019-01-09 MED ORDER — PROMETHAZINE HCL 25 MG/ML IJ SOLN
12.5000 mg | Freq: Four times a day (QID) | INTRAMUSCULAR | Status: DC | PRN
  Administered 2019-01-09 – 2019-01-10 (×2): 12.5 mg via INTRAVENOUS
  Filled 2019-01-09 (×2): qty 1

## 2019-01-09 MED ORDER — CLONAZEPAM 1 MG PO TABS
1.0000 mg | ORAL_TABLET | Freq: Every day | ORAL | Status: DC
  Administered 2019-01-09: 1 mg via ORAL
  Filled 2019-01-09: qty 1

## 2019-01-09 MED ORDER — ATENOLOL 25 MG PO TABS
12.5000 mg | ORAL_TABLET | Freq: Two times a day (BID) | ORAL | Status: DC
  Administered 2019-01-09 – 2019-01-10 (×3): 12.5 mg via ORAL
  Filled 2019-01-09 (×3): qty 0.5

## 2019-01-09 MED ORDER — ASPIRIN EC 81 MG PO TBEC
81.0000 mg | DELAYED_RELEASE_TABLET | Freq: Every day | ORAL | Status: DC
  Administered 2019-01-09: 81 mg via ORAL
  Filled 2019-01-09: qty 1

## 2019-01-09 MED ORDER — ALUM & MAG HYDROXIDE-SIMETH 200-200-20 MG/5ML PO SUSP: 30.0000 mL | Freq: Four times a day (QID) | ORAL | Status: DC | PRN

## 2019-01-09 MED ORDER — BACLOFEN 10 MG PO TABS: 20.0000 mg | ORAL_TABLET | Freq: Three times a day (TID) | ORAL | Status: DC | PRN

## 2019-01-09 MED ORDER — OXYCODONE HCL 5 MG PO TABS
5.0000 mg | ORAL_TABLET | ORAL | Status: DC | PRN
  Administered 2019-01-09: 10 mg via ORAL
  Administered 2019-01-09: 5 mg via ORAL
  Administered 2019-01-10: 10 mg via ORAL
  Filled 2019-01-09: qty 1
  Filled 2019-01-09 (×2): qty 2

## 2019-01-09 MED ORDER — VERAPAMIL HCL ER 240 MG PO TBCR
240.0000 mg | EXTENDED_RELEASE_TABLET | Freq: Every day | ORAL | Status: DC
  Administered 2019-01-09: 240 mg via ORAL
  Filled 2019-01-09: qty 1

## 2019-01-09 MED ORDER — ATORVASTATIN CALCIUM 10 MG PO TABS: 10.0000 mg | ORAL_TABLET | Freq: Every day | ORAL | Status: DC

## 2019-01-09 NOTE — Progress Notes (Signed)
PROGRESS NOTE                                                                                                                                                                                                             Patient Demographics:    [name], is a 68 y.o. female, DOB - [birth year], AB:[id number]  Admit date - 01/07/2019   Admitting Physician Shela Leff, MD  Outpatient Primary MD for the patient is [name]., MD  LOS - 1  Chief Complaint  Patient presents with   Emesis       Brief Narrative  [name] is a 68 y.o. female with medical history significant of COPD, DDD, spinal cord injury, hyperlipidemia, IBS, incomplete RBBB, depression, anxiety, carotid artery disease, PSVT presenting to the hospital for evaluation of nausea and vomiting.  Patient was seen in the ED on 9/8 for nausea and vomiting and was diagnosed with gastroenteritis at that time, further workup showed SBO.   Subjective:   Patient in bed, appears comfortable, denies any headache, no fever, no chest pain or pressure, no shortness of breath , no abdominal pain but ++ nausea. No focal weakness.   Assessment  & Plan :     1. SBO  - likely due to adhesions from previous surgery several years ago for rectal prolapse, NG tube was removed, she is passing flatus and having some liquid stools but on x-ray still has evidence of small bowel dilation, she is still extremely nauseated, continue to monitor another 24 hours with caution.  2.  COPD.  Stable no acute issues.  Supportive care.  3.  UTI.  On Rocephin, follow cultures.  4.  History of PSVT.  With caution will resume oral calcium channel blocker along with beta-blocker.  5.  Carotid artery disease.  No acute issues placed on oral statin and aspirin.  6.  Anxiety.  Home dose benzodiazepine started..  7.  GERD.  IV PPI for now.  8.  History of scoliosis with right hip deformity.  Supportive care.    Family  Communication  :  Husband - 01/08/19 @ 9.30am, no response  Code Status :  Full  Disposition Plan  :  INpt  Consults  :  CCS  Procedures  :    CT - Imaging quality is degraded by extensive streak artifact from patient's spinal hardware and bilateral hip prostheses. Findings concerning for a small-bowel obstruction with transition point in the left lower quadrant, likely secondary  to adhesions. No pneumatosis or portal venous gas. Likely reactive edematous mesenteric haziness. No large collection of free fluid though difficult to assess given streak in the pelvis. 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 Evidence of prior distal colectomy.  Patent anastomosis. Bilateral total hip arthroplasties in expected positioning without evidence of acute complication. Sacral nerve stimulator. Aortic Atherosclerosis.  DVT Prophylaxis  :  Lovenox    Lab Results  Component Value Date   PLT 150 01/08/2019    Diet :  Diet Order            Diet full liquid Room service appropriate? Yes; Fluid consistency: Thin  Diet effective now               Inpatient Medications Scheduled Meds:  aspirin  150 mg Rectal Daily   enoxaparin (LOVENOX) injection  30 mg Subcutaneous Daily   pantoprazole (PROTONIX) IV  40 mg Intravenous Q24H   Continuous Infusions:  cefTRIAXone (ROCEPHIN)  IV Stopped (01/09/19 0138)   dextrose 5 % with KCl 20 mEq / L 20 mEq (01/08/19 0829)   PRN Meds:.acetaminophen **OR** acetaminophen, alum & mag hydroxide-simeth, diphenhydrAMINE, ipratropium-albuterol, LORazepam, metoprolol tartrate, morphine injection, ondansetron (ZOFRAN) IV, ondansetron, oxyCODONE, promethazine, sodium chloride flush  Antibiotics  :   Anti-infectives (From admission, onward)   Start     Dose/Rate Route Frequency Ordered Stop   01/08/19 2200  cefTRIAXone (ROCEPHIN) 1 g in sodium chloride 0.9 % 100 mL IVPB     1 g 200 mL/hr over 30 Minutes Intravenous Every 24  hours 01/08/19 0038     01/07/19 2315  cefTRIAXone (ROCEPHIN) 1 g in sodium chloride 0.9 % 100 mL IVPB     1 g 200 mL/hr over 30 Minutes Intravenous  Once 01/07/19 2307 01/08/19 0051          Objective:   Vitals:   01/08/19 2024 01/09/19 0248 01/09/19 0522 01/09/19 0838  BP: 126/63  (!) 113/53 (!) 116/49  Pulse: 99  79 71  Resp: 16  18 18   Temp: 99.1 F (37.3 C)  98.8 F (37.1 C) 98.8 F (37.1 C)  TempSrc: Oral  Oral Oral  SpO2: 97%  97% 97%  Weight:  43.1 kg    Height:        Wt Readings from Last 3 Encounters:  01/09/19 43.1 kg  11/23/18 45.4 kg  10/27/18 45.5 kg     Intake/Output Summary (Last 24 hours) at 01/09/2019 1043 Last data filed at 01/09/2019 [id number] Gross per 24 hour  Intake 2137.56 ml  Output 2000 ml  Net 137.56 ml     Physical Exam  Awake Alert, Oriented X 3, No new F.N deficits, Normal affect [place].AT,PERRAL Supple Neck,No JVD, No cervical lymphadenopathy appriciated.  Symmetrical Chest wall movement, Good air movement bilaterally, CTAB RRR,No Gallops, Rubs or new Murmurs, No Parasternal Heave +ve B.Sounds, Abd Soft, mild generalized tenderness, No organomegaly appriciated, No rebound - guarding or rigidity. No Cyanosis, Clubbing or edema, No new Rash or bruise        Data Review:    CBC Recent Labs  Lab 01/03/19 1455 01/07/19 2136 01/08/19 0210  WBC 9.7 12.4* 10.8*  HGB 17.6* 14.8 15.4*  HCT 52.4* 44.9 47.0*  PLT 211 176 150  MCV 99.6 101.6* 101.5*  MCH 33.5 33.5 33.3  MCHC 33.6 33.0 32.8  RDW 12.4 12.2 12.2    Chemistries  Recent Labs  Lab 01/03/19 1455 01/07/19 2136 01/08/19 0210  NA 138 137 138  K 3.7 3.9 3.9  CL 95* 100 105  CO2 29 24 21*  GLUCOSE 113* 82 88  BUN 37* 22 20  CREATININE 1.06* 0.88 0.77  CALCIUM 9.8 8.8* 8.6*  MG  --   --  1.9  AST 28 40  --   ALT 25 42  --   ALKPHOS 86 68  --   BILITOT 1.4* 1.2  --     ------------------------------------------------------------------------------------------------------------------ No results for input(s): CHOL, HDL, LDLCALC, TRIG, CHOLHDL, LDLDIRECT in the last 72 hours.  No results found for: HGBA1C ------------------------------------------------------------------------------------------------------------------ No results for input(s): TSH, T4TOTAL, T3FREE, THYROIDAB in the last 72 hours.  Invalid input(s): FREET3 ------------------------------------------------------------------------------------------------------------------ No results for input(s): VITAMINB12, FOLATE, FERRITIN, TIBC, IRON, RETICCTPCT in the last 72 hours.  Coagulation profile No results for input(s): INR, PROTIME in the last 168 hours.  No results for input(s): DDIMER in the last 72 hours.  Cardiac Enzymes No results for input(s): CKMB, TROPONINI, MYOGLOBIN in the last 168 hours.  Invalid input(s): CK ------------------------------------------------------------------------------------------------------------------ No results found for: BNP  Micro Results Recent Results (from the past 240 hour(s))  Urine culture     Status: Abnormal   Collection Time: 01/07/19 10:04 PM   Specimen: Urine, Clean Catch  Result Value Ref Range Status   Specimen Description URINE, CLEAN CATCH  Final   Special Requests ADDED 0142 01/08/2019  Final   Culture (A)  Final    >=100,000 COLONIES/mL GROUP B STREP(S.AGALACTIAE)ISOLATED TESTING AGAINST S. AGALACTIAE NOT ROUTINELY PERFORMED DUE TO PREDICTABILITY OF AMP/PEN/VAN SUSCEPTIBILITY. Performed at [facility], [place] [address], [place], [place] [address]    Report Status 01/09/2019 FINAL  Final  SARS Coronavirus 2 [facility] order, Performed in [facility] hospital lab) Nasopharyngeal Nasopharyngeal Swab     Status: None   Collection Time: 01/08/19 12:02 AM   Specimen: Nasopharyngeal Swab  Result Value Ref Range Status   SARS  Coronavirus 2 NEGATIVE NEGATIVE Final    Comment: (NOTE) If result is NEGATIVE SARS-CoV-2 target nucleic acids are NOT DETECTED. The SARS-CoV-2 RNA is generally detectable in upper and lower  respiratory specimens during the acute phase of infection. The lowest  concentration of SARS-CoV-2 viral copies this assay can detect is 250  copies / mL. A negative result does not preclude SARS-CoV-2 infection  and should not be used as the sole basis for treatment or other  patient management decisions.  A negative result may occur with  improper specimen collection / handling, submission of specimen other  than nasopharyngeal swab, presence of viral mutation(s) within the  areas targeted by this assay, and inadequate number of viral copies  (<250 copies / mL). A negative result must be combined with clinical  observations, patient history, and epidemiological information. If result is POSITIVE SARS-CoV-2 target nucleic acids are DETECTED. The SARS-CoV-2 RNA is generally detectable in upper and lower  respiratory specimens dur ing the acute phase of infection.  Positive  results are indicative of active infection with SARS-CoV-2.  Clinical  correlation with patient history and other diagnostic information is  necessary to determine patient infection status.  Positive results do  not rule out bacterial infection or co-infection with other viruses. If result is PRESUMPTIVE POSTIVE SARS-CoV-2 nucleic acids MAY BE PRESENT.   A presumptive positive result was obtained on the submitted specimen  and confirmed on repeat testing.  While 2019 novel coronavirus  (SARS-CoV-2) nucleic acids may be present in the submitted sample  additional confirmatory testing may be necessary for epidemiological  and / or clinical management purposes  to differentiate between  SARS-CoV-2 and other Sarbecovirus currently known to infect humans.  If clinically indicated additional testing with an alternate test   methodology [phone]) is advised. The SARS-CoV-2 RNA is generally  detectable in upper and lower respiratory sp ecimens during the acute  phase of infection. The expected result is Negative. Fact Sheet for Patients:  StrictlyIdeas.no Fact Sheet for Healthcare Providers: BankingDealers.co.za This test is not yet approved or cleared by the [place] FDA and has been authorized for detection and/or diagnosis of SARS-CoV-2 by FDA under an Emergency Use Authorization (EUA).  This EUA will remain in effect (meaning this test can be used) for the duration of the COVID-19 declaration under Section 564(b)(1) of the Act, 21 U.S.C. section 360bbb-3(b)(1), unless the authorization is terminated or revoked sooner. Performed at [facility], [place] [address], [place], [place] [address]     Radiology Reports Ct Abdomen Pelvis W Contrast  Result Date: 01/07/2019 CLINICAL DATA:  Acute generalized abdominal pain EXAM: CT ABDOMEN AND PELVIS WITH CONTRAST TECHNIQUE: Multidetector CT imaging of the abdomen and pelvis was performed using the standard protocol following bolus administration of intravenous contrast. CONTRAST:  17mL OMNIPAQUE IOHEXOL 300 MG/ML  SOLN COMPARISON:  Most recent comparison CT 05/13/2018 FINDINGS: Lower chest: Marked emphysematous changes are present in the lung bases. Hepatobiliary: No concerning hepatic lesions. "Rosary sign" involving the tip of the gallbladder compatible with adenomyomatosis. No other gallbladder abnormality. No pericholecystic inflammation. Mild dilatation of the extrahepatic biliary tree is greater than expected for patient age but is unchanged prior CTs. Pancreas: Atrophic appearance of the pancreas. No visible pancreatic lesions. No pancreatic ductal dilatation. No peripancreatic inflammation. Spleen: Normal in size without focal abnormality. Adrenals/Urinary Tract: Insert adrenal glands. Few right renal sinus  cysts. Otherwise normal appearance of the kidneys on both portal venous and excretory phase imaging. Urinary bladder is poorly visualized due to extensive streak artifact. Stomach/Bowel: There is fluid distention of the proximal stomach and duodenal sweep as well as the proximal small bowel which demonstrates mural thickening and some anti dependent gas within the valvulae. No definite pneumatosis. Focal transition point is present left lower quadrant (3/45 more distal small bowel is decompressed. Inspissated fecal material remains present throughout the colon. Distal colonic anastomosis appears patent. No colonic dilatation or wall thickening. Vascular/Lymphatic: Extensive atherosclerotic calcifications present throughout evaluation for abdominal adenopathy is somewhat limited given a paucity of intraperitoneal fat. No definite adenopathy is evident. Reproductive: Suboptimal evaluation of the pelvis given extensive streak artifact from patient's hip prostheses and sacral nerve stimulator. Other: Some edematous increase in attenuation of the mid mesentery. No definite free fluid though poorly evaluated given obscuration of much of the pelvis. No free intraperitoneal air. Musculoskeletal: Bilateral total hip arthroplasties are in expected position without convincing feature of hardware complication. There is L2-L4 posterior spinal fusion with adjacent segment disease at L1-2. Marked levocurvature of the spine is centered at the L2 level. No suspicious osseous lesions are evident. Sacral nerve stimulator battery pack is position in the left gluteal soft tissues and enters the right third sacral foramen. IMPRESSION: Imaging quality is degraded by extensive streak artifact from patient's spinal hardware and bilateral hip prostheses. Findings concerning for a small-bowel obstruction with transition point in the left lower quadrant, likely secondary to adhesions. No pneumatosis or portal venous gas. Likely reactive  edematous mesenteric haziness. No large collection of free fluid though difficult to assess given streak in the pelvis. Gallbladder  adenomyomatosis, similar in appearance to multiple prior comparisons. Evidence of prior distal colectomy.  Patent anastomosis. Bilateral total hip arthroplasties in expected positioning without evidence of acute complication. Sacral nerve stimulator. Aortic Atherosclerosis (ICD10-I70.0). Emphysema (ICD10-J43.9). Electronically Signed   By: Lovena Le M.D.   On: 01/07/2019 23:44   Dg Abd Portable 1v-small Bowel Obstruction Protocol-24 Hr Delay  Result Date: 01/09/2019 CLINICAL DATA:  Small-bowel obstruction. 24 hour delayed radiograph. EXAM: PORTABLE ABDOMEN - 1 VIEW COMPARISON:  01/08/2019; CT abdomen pelvis-01/07/2019 FINDINGS: Ingested enteric contrast remains throughout the colon extending to the level the rectum, similar to the 12/2011 examination. There is persistent marked distention of a loop of small bowel within midline of the lower pelvis measuring approximately 4.5 cm diameter. No supine evidence of pneumoperitoneum. No pneumatosis or portal venous gas. Enteric staple line overlies the left lower abdomen. Interval removal of enteric tube. Atherosclerotic plaque within the abdominal aorta. Moderate scoliotic curvature of the thoracolumbar spine. Post multilevel paraspinal fusion and bilateral total hip replacements. Spinal stimulator device overlies the pelvis. Gluteal calcifications are seen bilaterally. IMPRESSION: 1. Enteric contrast seen throughout the colon extending to the level the rectum, with persistent distension of the small bowel, similar to the 09/13 examination, and again worrisome for small bowel obstruction. 2. Interval removal of enteric tube. Electronically Signed   By: Sandi Mariscal M.D.   On: 01/09/2019 07:11   Dg Abd Portable 1v-small Bowel Obstruction Protocol-initial, 8 Hr Delay  Result Date: 01/08/2019 CLINICAL DATA:  Small-bowel obstruction.   Delayed film. EXAM: PORTABLE ABDOMEN - 1 VIEW COMPARISON:  Abdominal radiograph earlier same day. FINDINGS: Enteric tube tip and side-port project over the stomach. Oral contrast material is demonstrated within the colon. What cirrhotic stimulator device projects over the pelvis. Bilateral hip arthroplasties. Lower thoracic and lumbar spine degenerative changes. IMPRESSION: Oral contrast material has progressed the colon. Electronically Signed   By: Lovey Newcomer M.D.   On: 01/08/2019 14:46   Dg Abd Portable 1v-small Bowel Protocol-position Verification  Result Date: 01/08/2019 CLINICAL DATA:  Confirmation of nasogastric tube placement. EXAM: PORTABLE ABDOMEN - 1 VIEW COMPARISON:  CT abdomen pelvis 01/07/2019 FINDINGS: Contrast opacification of the stomach and proximal small bowel with tip of the NG tube position at the level of duodenal bulb. A side port is not visualized the suspect this is within the gastric lumen given the significant opacification of the rugal folds. Lumbar fixation hardware is noted. Redemonstration of the chest wall deformity in severe scoliotic curvature of the spine. IMPRESSION: NG tube tip at the level of duodenal bulb. Electronically Signed   By: Lovena Le M.D.   On: 01/08/2019 03:20    Time Spent in minutes  30   Lala Lund M.D on 01/09/2019 at 10:43 AM  To page go to [email or web address] - password [facility]

## 2019-01-09 NOTE — Progress Notes (Addendum)
Subjective: CC: Patient reports she is still having some nausea. Her abdominal pain and distension have resolved. She is passing flatus and had 5 loose bm's yesterday.   Objective: Vital signs in last 24 hours: Temp:  [98.8 F (37.1 C)-99.2 F (37.3 C)] 98.8 F (37.1 C) (09/14 0838) Pulse Rate:  [71-99] 71 (09/14 0838) Resp:  [16-18] 18 (09/14 0838) BP: (113-126)/(49-63) 116/49 (09/14 0838) SpO2:  [96 %-97 %] 97 % (09/14 0838) Weight:  [43.1 kg] 43.1 kg (09/14 0248) Last BM Date: 01/08/19  Intake/Output from previous day: 09/13 0701 - 09/14 0700 In: 2017.6 [P.O.:600; I.V.:1281.4; IV Piggyback:136.2] Out: 2800 [Urine:800; Emesis/NG output:2000] Intake/Output this shift: Total I/O In: 120 [P.O.:120] Out: -   PE: Gen:  Alert, NAD, pleasant Pulm:  Normal rate and effort  Abd: Soft, NT/ND, +BS Ext:  No LE edema  Skin: no rashes noted, warm and dry  Lab Results:  Recent Labs    01/07/19 2136 01/08/19 0210  WBC 12.4* 10.8*  HGB 14.8 15.4*  HCT 44.9 47.0*  PLT 176 150   BMET Recent Labs    01/07/19 2136 01/08/19 0210  NA 137 138  K 3.9 3.9  CL 100 105  CO2 24 21*  GLUCOSE 82 88  BUN 22 20  CREATININE 0.88 0.77  CALCIUM 8.8* 8.6*   PT/INR No results for input(s): LABPROT, INR in the last 72 hours. CMP     Component Value Date/Time   NA 138 01/08/2019 0210   NA 144 11/03/2016 0920   K 3.9 01/08/2019 0210   CL 105 01/08/2019 0210   CO2 21 (L) 01/08/2019 0210   GLUCOSE 88 01/08/2019 0210   BUN 20 01/08/2019 0210   BUN 19 11/03/2016 0920   CREATININE 0.77 01/08/2019 0210   CREATININE 0.80 02/28/2015 1023   CALCIUM 8.6 (L) 01/08/2019 0210   PROT 5.4 (L) 01/07/2019 2136   PROT 6.5 11/03/2016 0920   ALBUMIN 3.1 (L) 01/07/2019 2136   ALBUMIN 4.4 11/03/2016 0920   AST 40 01/07/2019 2136   ALT 42 01/07/2019 2136   ALKPHOS 68 01/07/2019 2136   BILITOT 1.2 01/07/2019 2136   BILITOT 0.4 11/03/2016 0920   GFRNONAA >60 01/08/2019 0210   GFRAA >60  01/08/2019 0210   Lipase     Component Value Date/Time   LIPASE 48 01/07/2019 2136       Studies/Results: Ct Abdomen Pelvis W Contrast  Result Date: 01/07/2019 CLINICAL DATA:  Acute generalized abdominal pain EXAM: CT ABDOMEN AND PELVIS WITH CONTRAST TECHNIQUE: Multidetector CT imaging of the abdomen and pelvis was performed using the standard protocol following bolus administration of intravenous contrast. CONTRAST:  59mL OMNIPAQUE IOHEXOL 300 MG/ML  SOLN COMPARISON:  Most recent comparison CT 05/13/2018 FINDINGS: Lower chest: Marked emphysematous changes are present in the lung bases. Hepatobiliary: No concerning hepatic lesions. "Rosary sign" involving the tip of the gallbladder compatible with adenomyomatosis. No other gallbladder abnormality. No pericholecystic inflammation. Mild dilatation of the extrahepatic biliary tree is greater than expected for patient age but is unchanged prior CTs. Pancreas: Atrophic appearance of the pancreas. No visible pancreatic lesions. No pancreatic ductal dilatation. No peripancreatic inflammation. Spleen: Normal in size without focal abnormality. Adrenals/Urinary Tract: Insert adrenal glands. Few right renal sinus cysts. Otherwise normal appearance of the kidneys on both portal venous and excretory phase imaging. Urinary bladder is poorly visualized due to extensive streak artifact. Stomach/Bowel: There is fluid distention of the proximal stomach and duodenal sweep as well as  the proximal small bowel which demonstrates mural thickening and some anti dependent gas within the valvulae. No definite pneumatosis. Focal transition point is present left lower quadrant (3/45 more distal small bowel is decompressed. Inspissated fecal material remains present throughout the colon. Distal colonic anastomosis appears patent. No colonic dilatation or wall thickening. Vascular/Lymphatic: Extensive atherosclerotic calcifications present throughout evaluation for abdominal  adenopathy is somewhat limited given a paucity of intraperitoneal fat. No definite adenopathy is evident. Reproductive: Suboptimal evaluation of the pelvis given extensive streak artifact from patient's hip prostheses and sacral nerve stimulator. Other: Some edematous increase in attenuation of the mid mesentery. No definite free fluid though poorly evaluated given obscuration of much of the pelvis. No free intraperitoneal air. Musculoskeletal: Bilateral total hip arthroplasties are in expected position without convincing feature of hardware complication. There is L2-L4 posterior spinal fusion with adjacent segment disease at L1-2. Marked levocurvature of the spine is centered at the L2 level. No suspicious osseous lesions are evident. Sacral nerve stimulator battery pack is position in the left gluteal soft tissues and enters the right third sacral foramen. IMPRESSION: Imaging quality is degraded by extensive streak artifact from patient's spinal hardware and bilateral hip prostheses. Findings concerning for a small-bowel obstruction with transition point in the left lower quadrant, likely secondary to adhesions. No pneumatosis or portal venous gas. Likely reactive edematous mesenteric haziness. No large collection of free fluid though difficult to assess given streak in the pelvis. 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 Evidence of prior distal colectomy.  Patent anastomosis. Bilateral total hip arthroplasties in expected positioning without evidence of acute complication. Sacral nerve stimulator. Aortic Atherosclerosis (ICD10-I70.0). Emphysema (ICD10-J43.9). Electronically Signed   By: Lovena Le M.D.   On: 01/07/2019 23:44   Dg Abd Portable 1v-small Bowel Obstruction Protocol-24 Hr Delay  Result Date: 01/09/2019 CLINICAL DATA:  Small-bowel obstruction. 24 hour delayed radiograph. EXAM: PORTABLE ABDOMEN - 1 VIEW COMPARISON:  01/08/2019; CT abdomen pelvis-01/07/2019  FINDINGS: Ingested enteric contrast remains throughout the colon extending to the level the rectum, similar to the 12/2011 examination. There is persistent marked distention of a loop of small bowel within midline of the lower pelvis measuring approximately 4.5 cm diameter. No supine evidence of pneumoperitoneum. No pneumatosis or portal venous gas. Enteric staple line overlies the left lower abdomen. Interval removal of enteric tube. Atherosclerotic plaque within the abdominal aorta. Moderate scoliotic curvature of the thoracolumbar spine. Post multilevel paraspinal fusion and bilateral total hip replacements. Spinal stimulator device overlies the pelvis. Gluteal calcifications are seen bilaterally. IMPRESSION: 1. Enteric contrast seen throughout the colon extending to the level the rectum, with persistent distension of the small bowel, similar to the 09/13 examination, and again worrisome for small bowel obstruction. 2. Interval removal of enteric tube. Electronically Signed   By: Sandi Mariscal M.D.   On: 01/09/2019 07:11   Dg Abd Portable 1v-small Bowel Obstruction Protocol-initial, 8 Hr Delay  Result Date: 01/08/2019 CLINICAL DATA:  Small-bowel obstruction.  Delayed film. EXAM: PORTABLE ABDOMEN - 1 VIEW COMPARISON:  Abdominal radiograph earlier same day. FINDINGS: Enteric tube tip and side-port project over the stomach. Oral contrast material is demonstrated within the colon. What cirrhotic stimulator device projects over the pelvis. Bilateral hip arthroplasties. Lower thoracic and lumbar spine degenerative changes. IMPRESSION: Oral contrast material has progressed the colon. Electronically Signed   By: Lovey Newcomer M.D.   On: 01/08/2019 14:46   Dg Abd Portable 1v-small Bowel Protocol-position Verification  Result Date: 01/08/2019 CLINICAL DATA:  Confirmation  of nasogastric tube placement. EXAM: PORTABLE ABDOMEN - 1 VIEW COMPARISON:  CT abdomen pelvis 01/07/2019 FINDINGS: Contrast opacification of the  stomach and proximal small bowel with tip of the NG tube position at the level of duodenal bulb. A side port is not visualized the suspect this is within the gastric lumen given the significant opacification of the rugal folds. Lumbar fixation hardware is noted. Redemonstration of the chest wall deformity in severe scoliotic curvature of the spine. IMPRESSION: NG tube tip at the level of duodenal bulb. Electronically Signed   By: Lovena Le M.D.   On: 01/08/2019 03:20    Anti-infectives: Anti-infectives (From admission, onward)   Start     Dose/Rate Route Frequency Ordered Stop   01/08/19 2200  cefTRIAXone (ROCEPHIN) 1 g in sodium chloride 0.9 % 100 mL IVPB     1 g 200 mL/hr over 30 Minutes Intravenous Every 24 hours 01/08/19 0038     01/07/19 2315  cefTRIAXone (ROCEPHIN) 1 g in sodium chloride 0.9 % 100 mL IVPB     1 g 200 mL/hr over 30 Minutes Intravenous  Once 01/07/19 2307 01/08/19 0051       Assessment/Plan COPD CAD HLD UTI   SBO  - Hx of abdominal rectropexy   - Contrast in colon on xray  - Adv to fulls   - Adv as tolerated to soft diet  - Okay for d/c if tolerates diet  - Mobilize for bowel function  FEN - FLD VTE - SCDs, Lovenox  ID - Rocephin (UTI)   LOS: 1 day    Jillyn Ledger , [facility] 01/09/2019, 10:00 AM Pager: [phone]  Agree with above. She seems to be getting better.  Alphonsa Overall, MD, [facility] Pager: [phone] Office phone:  [phone]

## 2019-01-09 NOTE — Evaluation (Signed)
Physical Therapy Evaluation Patient Details Name: [name] MRN: [id number] DOB: [birth year] Today's Date: 01/09/2019   History of Present Illness  [name] is a 68 y.o. female with medical history significant of COPD, DDD, spinal cord injury, hyperlipidemia, IBS, incomplete RBBB, depression, anxiety, carotid artery disease, PSVT presenting to the hospital for evaluation of nausea and vomiting.  Patient was seen in the ED on 9/8 for nausea and vomiting and was diagnosed with gastroenteritis at that time, further workup showed SBO.  Clinical Impression   Pt admitted with above diagnosis. Independent; Presents with decr activity tolerance, nausea limiting gait distance; Still, moves very well and walks well when wearing her shoes form home (with a lift in one shoe because of long-standing scoliosis);  Pt currently with functional limitations due to the deficits listed below (see PT Problem List). Pt will benefit from skilled PT to increase their independence and safety with mobility to allow discharge to the venue listed below.  I anticipate good progress.     Follow Up Recommendations No PT follow up    Equipment Recommendations  None recommended by PT    Recommendations for Other Services       Precautions / Restrictions        Mobility  Bed Mobility Overal bed mobility: Needs Assistance Bed Mobility: Sidelying to Sit;Sit to Sidelying   Sidelying to sit: Supervision     Sit to sidelying: Supervision General bed mobility comments: Supervision for safety  Transfers Overall transfer level: Needs assistance Equipment used: None Transfers: Sit to/from Stand Sit to Stand: Supervision         General transfer comment: supervision for safety; good rise  Ambulation/Gait Ambulation/Gait assistance: Min guard Gait Distance (Feet): 30 Feet Assistive device: IV Pole Gait Pattern/deviations: Step-through pattern;Decreased step length - right;Decreased step length -  left     General Gait Details: Cues to self-monitor for activity tolernace; Exhausted and uncomfortable, but moving well; uses her own shoes which have a lift in one because of longstanding scoliosis  Stairs            Wheelchair Mobility    Modified Rankin (Stroke Patients Only)       Balance                                             Pertinent Vitals/Pain Pain Assessment: Faces Faces Pain Scale: Hurts even more Pain Location: abdomen Pain Descriptors / Indicators: Discomfort;Grimacing;Guarding Pain Intervention(s): Monitored during session;Repositioned    Home Living Family/patient expects to be discharged to:: Private residence Living Arrangements: Spouse/significant other Available Help at Discharge: Family;Available 24 hours/day Type of Home: House Home Access: Level entry     Home Layout: Multi-level Home Equipment: Walker - 2 wheels;Bedside commode;Shower seat;Hand held shower head      Prior Function Level of Independence: Independent               Hand Dominance        Extremity/Trunk Assessment   Upper Extremity Assessment Upper Extremity Assessment: Generalized weakness    Lower Extremity Assessment Lower Extremity Assessment: Generalized weakness       Communication   Communication: No difficulties  Cognition Arousal/Alertness: Awake/alert Behavior During Therapy: WFL for tasks assessed/performed Overall Cognitive Status: Within Functional Limits for tasks assessed  General Comments      Exercises     Assessment/Plan    PT Assessment Patient needs continued PT services  PT Problem List Decreased strength;Decreased activity tolerance       PT Treatment Interventions DME instruction;Gait training;Stair training;Functional mobility training;Therapeutic activities;Therapeutic exercise;Patient/family education    PT Goals (Current goals can be found  in the Care Plan section)  Acute Rehab PT Goals Patient Stated Goal: Hopes to feel better and get home soon PT Goal Formulation: With patient Time For Goal Achievement: 01/23/19 Potential to Achieve Goals: Good    Frequency Min 3X/week   Barriers to discharge        Co-evaluation               AM-PAC PT "6 Clicks" Mobility  Outcome Measure Help needed turning from your back to your side while in a flat bed without using bedrails?: None Help needed moving from lying on your back to sitting on the side of a flat bed without using bedrails?: None Help needed moving to and from a bed to a chair (including a wheelchair)?: None Help needed standing up from a chair using your arms (e.g., wheelchair or bedside chair)?: None Help needed to walk in hospital room?: None Help needed climbing 3-5 steps with a railing? : A Little 6 Click Score: 23    End of Session   Activity Tolerance: Patient tolerated treatment well Patient left: in bed;with call bell/phone within reach Nurse Communication: Mobility status PT Visit Diagnosis: Other abnormalities of gait and mobility (R26.89)    Time: [id number] PT Time Calculation (min) (ACUTE ONLY): 23 min   Charges:   PT Evaluation $PT Eval Low Complexity: 1 Low PT Treatments $Gait Training: 8-22 mins        Roney Marion, PT  Acute Rehabilitation Services Pager [phone] Office [phone]   [place] 01/09/2019, 4:08 PM

## 2019-01-09 NOTE — Plan of Care (Signed)
  Problem: Activity: Goal: Risk for activity intolerance will decrease Outcome: Progressing   

## 2019-01-10 LAB — BASIC METABOLIC PANEL
Anion gap: 10 (ref 5–15)
BUN: <5 mg/dL — ABNORMAL LOW (ref 8–23)
CO2: 26 mmol/L (ref 22–32)
Calcium: 8.3 mg/dL — ABNORMAL LOW (ref 8.9–10.3)
Chloride: 108 mmol/L (ref 98–111)
Creatinine, Ser: 0.64 mg/dL (ref 0.44–1.00)
GFR calc Af Amer: >60 mL/min (ref >60)
GFR calc non Af Amer: >60 mL/min (ref >60)
Glucose, Bld: 69 mg/dL — ABNORMAL LOW (ref 70–99)
Potassium: 3.2 mmol/L — ABNORMAL LOW (ref 3.5–5.1)
Sodium: 144 mmol/L (ref 135–145)

## 2019-01-10 LAB — MAGNESIUM: Magnesium: 1.9 mg/dL (ref 1.7–2.4)

## 2019-01-10 MED ORDER — POTASSIUM CHLORIDE CRYS ER 20 MEQ PO TBCR
40.0000 meq | EXTENDED_RELEASE_TABLET | Freq: Once | ORAL | Status: AC
  Administered 2019-01-10: 40 meq via ORAL
  Filled 2019-01-10: qty 2

## 2019-01-10 MED ORDER — PROMETHAZINE HCL 25 MG PO TABS: 12.5000 mg | ORAL_TABLET | Freq: Three times a day (TID) | ORAL | 0 refills | Status: DC | PRN

## 2019-01-10 MED ORDER — FLUCONAZOLE 100 MG PO TABS
100.0000 mg | ORAL_TABLET | Freq: Once | ORAL | Status: AC
  Administered 2019-01-10: 100 mg via ORAL
  Filled 2019-01-10: qty 1

## 2019-01-10 NOTE — Progress Notes (Signed)
Physical Therapy Treatment Patient Details Name: [name] MRN: [id number] DOB: 1950/08/30 Today's Date: 01/10/2019    History of Present Illness [name] is a 68 y.o. female with medical history significant of COPD, DDD, spinal cord injury, hyperlipidemia, IBS, incomplete RBBB, depression, anxiety, carotid artery disease, PSVT presenting to the hospital for evaluation of nausea and vomiting.  Patient was seen in the ED on 9/8 for nausea and vomiting and was diagnosed with gastroenteritis at that time, further workup showed SBO.    PT Comments    Continuing work on functional mobility and activity tolerance;  Walked the hallways and did stair trianing; Overall no difficulties with uncomplicated amb -- we discussed using a cane while she feels weak and tends to reach out for UE support; We also discussed gentle progression of her home walking program; Questions solicited and answered; OK for dc home from PT standpoint    Follow Up Recommendations  No PT follow up     Equipment Recommendations  None recommended by PT    Recommendations for Other Services       Precautions / Restrictions Precautions Precautions: None    Mobility  Bed Mobility Overal bed mobility: Independent             General bed mobility comments: NO difficulty  Transfers Overall transfer level: Independent               General transfer comment: No difficulty  Ambulation/Gait Ambulation/Gait assistance: Supervision;Modified independent (Device/Increase time) Gait Distance (Feet): 350 Feet Assistive device: None;IV Pole(Occasional use of hallway rail) Gait Pattern/deviations: WFL(Within Functional Limits)     General Gait Details: Cues to self-monitor for activity tolerance; Managing well, occasionally reaching out for UE support; We discussed using a cane as needed   Stairs Stairs: Yes Stairs assistance: Supervision Stair Management: One rail Right;Alternating  pattern;Forwards Number of Stairs: 5 General stair comments: No difficulty   Wheelchair Mobility    Modified Rankin (Stroke Patients Only)       Balance                                            Cognition Arousal/Alertness: Awake/alert Behavior During Therapy: WFL for tasks assessed/performed Overall Cognitive Status: Within Functional Limits for tasks assessed                                        Exercises      General Comments        Pertinent Vitals/Pain Pain Assessment: Faces Faces Pain Scale: Hurts a little bit Pain Location: abdomen; slight grimace and guard, but managing well with dressing, toileting, walking Pain Descriptors / Indicators: Grimacing Pain Intervention(s): Monitored during session    Home Living                      Prior Function            PT Goals (current goals can now be found in the care plan section) Acute Rehab PT Goals Patient Stated Goal: Home today! PT Goal Formulation: With patient Time For Goal Achievement: 01/23/19 Potential to Achieve Goals: Good Progress towards PT goals: Progressing toward goals    Frequency    Min 3X/week      PT Plan Current plan  remains appropriate    Co-evaluation              AM-PAC PT "6 Clicks" Mobility   Outcome Measure  Help needed turning from your back to your side while in a flat bed without using bedrails?: None Help needed moving from lying on your back to sitting on the side of a flat bed without using bedrails?: None Help needed moving to and from a bed to a chair (including a wheelchair)?: None Help needed standing up from a chair using your arms (e.g., wheelchair or bedside chair)?: None Help needed to walk in hospital room?: None Help needed climbing 3-5 steps with a railing? : A Little 6 Click Score: 23    End of Session   Activity Tolerance: Patient tolerated treatment well Patient left: in bed;with call  bell/phone within reach Nurse Communication: Mobility status PT Visit Diagnosis: Other abnormalities of gait and mobility (R26.89)     Time: [id number] PT Time Calculation (min) (ACUTE ONLY): 20 min  Charges:  $Gait Training: 8-22 mins                     Roney Marion, [place] Pager [phone] Office [place] 01/10/2019, 10:12 AM

## 2019-01-10 NOTE — Progress Notes (Addendum)
Subjective: CC: Patient doing well. No abdominal pain, abdominal distension or emesis. Still having some nausea but feels it is controlled. Tolerating a soft diet. Passing flatus. Had several BM's yesterday.   Objective: Vital signs in last 24 hours: Temp:  [97.7 F (36.5 C)-98.7 F (37.1 C)] 97.7 F (36.5 C) (09/15 0915) Pulse Rate:  [68-71] 68 (09/15 0915) Resp:  [16-18] 18 (09/15 0915) BP: (95-114)/(53-80) 95/68 (09/15 0915) SpO2:  [96 %-100 %] 100 % (09/15 0915) Weight:  [43.7 kg] 43.7 kg (09/14 2143) Last BM Date: 01/09/19  Intake/Output from previous day: 09/14 0701 - 09/15 0700 In: 368.8 [P.O.:120; I.V.:248.8] Out: -  Intake/Output this shift: Total I/O In: 120 [P.O.:120] Out: -   PE: Gen:  Alert, NAD, pleasant Pulm:  Normal rate and effort  Abd: Soft, NT/ND, +BS Ext:  No LE edema  Skin: no rashes noted, warm and dry   Lab Results:  Recent Labs    01/07/19 2136 01/08/19 0210  WBC 12.4* 10.8*  HGB 14.8 15.4*  HCT 44.9 47.0*  PLT 176 150   BMET Recent Labs    01/08/19 0210 01/10/19 0713  NA 138 144  K 3.9 3.2*  CL 105 108  CO2 21* 26  GLUCOSE 88 69*  BUN 20 <5*  CREATININE 0.77 0.64  CALCIUM 8.6* 8.3*   PT/INR No results for input(s): LABPROT, INR in the last 72 hours. CMP     Component Value Date/Time   NA 144 01/10/2019 0713   NA 144 11/03/2016 0920   K 3.2 (L) 01/10/2019 0713   CL 108 01/10/2019 0713   CO2 26 01/10/2019 0713   GLUCOSE 69 (L) 01/10/2019 0713   BUN <5 (L) 01/10/2019 0713   BUN 19 11/03/2016 0920   CREATININE 0.64 01/10/2019 0713   CREATININE 0.80 02/28/2015 1023   CALCIUM 8.3 (L) 01/10/2019 0713   PROT 5.4 (L) 01/07/2019 2136   PROT 6.5 11/03/2016 0920   ALBUMIN 3.1 (L) 01/07/2019 2136   ALBUMIN 4.4 11/03/2016 0920   AST 40 01/07/2019 2136   ALT 42 01/07/2019 2136   ALKPHOS 68 01/07/2019 2136   BILITOT 1.2 01/07/2019 2136   BILITOT 0.4 11/03/2016 0920   GFRNONAA >60 01/10/2019 0713   GFRAA >60  01/10/2019 0713   Lipase     Component Value Date/Time   LIPASE 48 01/07/2019 2136       Studies/Results: Dg Abd Portable 1v-small Bowel Obstruction Protocol-24 Hr Delay  Result Date: 01/09/2019 CLINICAL DATA:  Small-bowel obstruction. 24 hour delayed radiograph. EXAM: PORTABLE ABDOMEN - 1 VIEW COMPARISON:  01/08/2019; CT abdomen pelvis-01/07/2019 FINDINGS: Ingested enteric contrast remains throughout the colon extending to the level the rectum, similar to the 12/2011 examination. There is persistent marked distention of a loop of small bowel within midline of the lower pelvis measuring approximately 4.5 cm diameter. No supine evidence of pneumoperitoneum. No pneumatosis or portal venous gas. Enteric staple line overlies the left lower abdomen. Interval removal of enteric tube. Atherosclerotic plaque within the abdominal aorta. Moderate scoliotic curvature of the thoracolumbar spine. Post multilevel paraspinal fusion and bilateral total hip replacements. Spinal stimulator device overlies the pelvis. Gluteal calcifications are seen bilaterally. IMPRESSION: 1. Enteric contrast seen throughout the colon extending to the level the rectum, with persistent distension of the small bowel, similar to the 09/13 examination, and again worrisome for small bowel obstruction. 2. Interval removal of enteric tube. Electronically Signed   By: Sandi Mariscal M.D.   On: 01/09/2019  07:11   Dg Abd Portable 1v-small Bowel Obstruction Protocol-initial, 8 Hr Delay  Result Date: 01/08/2019 CLINICAL DATA:  Small-bowel obstruction.  Delayed film. EXAM: PORTABLE ABDOMEN - 1 VIEW COMPARISON:  Abdominal radiograph earlier same day. FINDINGS: Enteric tube tip and side-port project over the stomach. Oral contrast material is demonstrated within the colon. What cirrhotic stimulator device projects over the pelvis. Bilateral hip arthroplasties. Lower thoracic and lumbar spine degenerative changes. IMPRESSION: Oral contrast material  has progressed the colon. Electronically Signed   By: Lovey Newcomer M.D.   On: 01/08/2019 14:46    Anti-infectives: Anti-infectives (From admission, onward)   Start     Dose/Rate Route Frequency Ordered Stop   01/10/19 0845  fluconazole (DIFLUCAN) tablet 100 mg     100 mg Oral  Once 01/10/19 0839 01/10/19 0858   01/08/19 2200  cefTRIAXone (ROCEPHIN) 1 g in sodium chloride 0.9 % 100 mL IVPB     1 g 200 mL/hr over 30 Minutes Intravenous Every 24 hours 01/08/19 0038     01/07/19 2315  cefTRIAXone (ROCEPHIN) 1 g in sodium chloride 0.9 % 100 mL IVPB     1 g 200 mL/hr over 30 Minutes Intravenous  Once 01/07/19 2307 01/08/19 0051       Assessment/Plan COPD CAD HLD UTI   SBO             - Hx of abdominal rectropexy              - Resolved  - Okay for d/c from our standpoint.               FEN - Soft VTE - SCDs, Lovenox  ID - Rocephin (UTI)   LOS: 2 days   Jillyn Ledger , [facility] 01/10/2019, 9:44 AM Pager: [phone]  Agree with above.  Alphonsa Overall, MD, [facility] Surgery Pager: [phone] Office phone:  [phone]

## 2019-01-10 NOTE — Discharge Summary (Signed)
[name] [id number] DOB: 12-10-1950 DOA: 01/07/2019  PCP: Raina Mina., MD  Admit date: 01/07/2019  Discharge date: 01/10/2019  Admitted From: Home   Disposition:  Home   Recommendations for Outpatient Follow-up:   Follow up with PCP in 1-2 weeks  PCP Please obtain BMP/CBC, 2 view CXR in 1week,  (see Discharge instructions)   PCP Please follow up on the following pending results:    Home Health: HHP,RN   Equipment/Devices: None  Consultations: None  Discharge Condition: Stable    CODE STATUS: Full    Diet Recommendation: Soft diet x 3 days then gradually advance to regular consistency as tolera    Chief Complaint  Patient presents with   Emesis     Brief history of present illness from the day of admission and additional interim summary     [name] a 68 y.o.femalewith medical history significant ofCOPD, DDD, spinal cord injury, hyperlipidemia, IBS, incomplete RBBB, depression, anxiety, carotid artery disease, PSVT presenting to the hospital for evaluation of nausea and vomiting. Patient was seen in the ED on 9/8 for nausea and vomiting and was diagnosed with gastroenteritis at that time, further workup showed SBO.                                                                 Hospital Course   1. SBO  - likely due to adhesions from previous surgery several years ago for rectal prolapse, NG tube was removed, she is now having BMs, symptom free, cleared by [facility], will DC home on 3 days of Soft diet with PCP follow up in 1 week.  2.  COPD.  Stable no acute issues.  Supportive care.  3.  UTI.  got Rocephin x 3 days and Diflucan x 1.  4.  History of PSVT.  On home oral calcium channel blocker along with beta-blocker.  5.  Carotid artery disease.  No acute issues placed on oral  statin and aspirin.  6.  Anxiety.  Home dose benzodiazepine started..  7.  GERD. On PPI.  8.  History of scoliosis with right hip deformity.  Supportive care. Home HHPT if qualifies.   9. Hypokalemia - replaced.  Discharge diagnosis     Principal Problem:   SBO (small bowel obstruction) (HCC) Active Problems:   COPD (chronic obstructive pulmonary disease) (HCC)   UTI (urinary tract infection)    Discharge instructions    Discharge Instructions    Discharge instructions   Complete by: As directed    Follow with Primary MD Raina Mina., MD in 7 days   Get CBC, CMP, Abdominal  X ray -  checked next visit within 1 week by Primary MD    Activity: As tolerated with Full fall precautions use walker/cane & assistance as needed  Disposition Home    Diet:Soft diet x 3 days then gradually advance to regular consistency as tolerated.    Special Instructions: If you have smoked or chewed Tobacco  in the last 2 yrs please stop smoking, stop any regular Alcohol  and or any Recreational drug use.  On your next visit with your primary care physician please Get Medicines reviewed and adjusted.  Please request your Prim.MD to go over all Hospital Tests and Procedure/Radiological results at the follow up, please get all Hospital records sent to your Prim MD by signing hospital release before you go home.  If you experience worsening of your admission symptoms, develop shortness of breath, life threatening emergency, suicidal or homicidal thoughts you must seek medical attention immediately by calling 911 or calling your MD immediately  if symptoms less severe.  You Must read complete instructions/literature along with all the possible adverse reactions/side effects for all the Medicines you take and that have been prescribed to you. Take any new Medicines after you have completely understood and accpet all the possible adverse reactions/side effects.   Increase activity slowly    Complete by: As directed       Discharge Medications   Allergies as of 01/10/2019      Reactions   Bupropion Hcl Swelling   Celecoxib Swelling   Naproxen Swelling   Methadone Nausea And Vomiting   Morphine Itching      Medication List    TAKE these medications   aspirin EC 81 MG tablet Take 81 mg by mouth at bedtime.   atenolol 25 MG tablet Commonly known as: TENORMIN Take 0.5 tablets (12.5 mg total) by mouth 2 (two) times daily.   atorvastatin 10 MG tablet Commonly known as: LIPITOR Take 1 tablet (10 mg total) by mouth daily. What changed: when to take this   baclofen 20 MG tablet Commonly known as: LIORESAL Take 20 mg by mouth 3 (three) times daily as needed for muscle spasms.   bismuth subsalicylate [id number] suspension Commonly known as: PEPTO BISMOL Take 30 mLs by mouth every 6 (six) hours as needed for indigestion.   CALCIUM + D PO Take 1 tablet by mouth 2 (two) times daily. Calcium Magnesium Zinc and Vitamin D   clonazePAM 1 MG tablet Commonly known as: KLONOPIN Take 1 mg by mouth at bedtime.   dimenhyDRINATE 50 MG tablet Commonly known as: DRAMAMINE Take 25 mg by mouth every 8 (eight) hours as needed for nausea.   fish oil-omega-3 fatty acids 1000 MG capsule Take 1 g by mouth at bedtime.   multivitamin with minerals Tabs tablet Take 1 tablet by mouth daily.   omeprazole 20 MG capsule Commonly known as: PRILOSEC Take 20 mg by mouth daily.   ondansetron 4 MG tablet Commonly known as: ZOFRAN Take 1 tablet (4 mg total) by mouth every 6 (six) hours as needed for nausea or vomiting.   promethazine 25 MG tablet Commonly known as: PHENERGAN Take 0.5 tablets (12.5 mg total) by mouth every 8 (eight) hours as needed for nausea or vomiting.   verapamil 240 MG CR tablet Commonly known as: CALAN-SR TAKE 1 TABLET BY MOUTH DAILY AT BEDTIME What changed:   how much to take  how to take this  when to take this  additional instructions        Follow-up Information    Raina Mina., MD. Schedule an appointment as soon as possible for a visit in 1 week(s).   Specialty: Internal Medicine Contact  information: [place] [address] [phone]        Troy Sine, MD .   Specialty: Cardiology Contact information: [address] [place] [address] [phone]           Major procedures and Radiology Reports - PLEASE review detailed and final reports thoroughly  -        Ct Abdomen Pelvis W Contrast  Result Date: 01/07/2019 CLINICAL DATA:  Acute generalized abdominal pain EXAM: CT ABDOMEN AND PELVIS WITH CONTRAST TECHNIQUE: Multidetector CT imaging of the abdomen and pelvis was performed using the standard protocol following bolus administration of intravenous contrast. CONTRAST:  49mL OMNIPAQUE IOHEXOL 300 MG/ML  SOLN COMPARISON:  Most recent comparison CT 05/13/2018 FINDINGS: Lower chest: Marked emphysematous changes are present in the lung bases. Hepatobiliary: No concerning hepatic lesions. "Rosary sign" involving the tip of the gallbladder compatible with adenomyomatosis. No other gallbladder abnormality. No pericholecystic inflammation. Mild dilatation of the extrahepatic biliary tree is greater than expected for patient age but is unchanged prior CTs. Pancreas: Atrophic appearance of the pancreas. No visible pancreatic lesions. No pancreatic ductal dilatation. No peripancreatic inflammation. Spleen: Normal in size without focal abnormality. Adrenals/Urinary Tract: Insert adrenal glands. Few right renal sinus cysts. Otherwise normal appearance of the kidneys on both portal venous and excretory phase imaging. Urinary bladder is poorly visualized due to extensive streak artifact. Stomach/Bowel: There is fluid distention of the proximal stomach and duodenal sweep as well as the proximal small bowel which demonstrates mural thickening and some anti dependent gas within the valvulae.  No definite pneumatosis. Focal transition point is present left lower quadrant (3/45 more distal small bowel is decompressed. Inspissated fecal material remains present throughout the colon. Distal colonic anastomosis appears patent. No colonic dilatation or wall thickening. Vascular/Lymphatic: Extensive atherosclerotic calcifications present throughout evaluation for abdominal adenopathy is somewhat limited given a paucity of intraperitoneal fat. No definite adenopathy is evident. Reproductive: Suboptimal evaluation of the pelvis given extensive streak artifact from patient's hip prostheses and sacral nerve stimulator. Other: Some edematous increase in attenuation of the mid mesentery. No definite free fluid though poorly evaluated given obscuration of much of the pelvis. No free intraperitoneal air. Musculoskeletal: Bilateral total hip arthroplasties are in expected position without convincing feature of hardware complication. There is L2-L4 posterior spinal fusion with adjacent segment disease at L1-2. Marked levocurvature of the spine is centered at the L2 level. No suspicious osseous lesions are evident. Sacral nerve stimulator battery pack is position in the left gluteal soft tissues and enters the right third sacral foramen. IMPRESSION: Imaging quality is degraded by extensive streak artifact from patient's spinal hardware and bilateral hip prostheses. Findings concerning for a small-bowel obstruction with transition point in the left lower quadrant, likely secondary to adhesions. No pneumatosis or portal venous gas. Likely reactive edematous mesenteric haziness. No large collection of free fluid though difficult to assess given streak in the pelvis. 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 Evidence of prior distal colectomy.  Patent anastomosis. Bilateral total hip arthroplasties in expected positioning without evidence of acute complication. Sacral nerve stimulator.  Aortic Atherosclerosis (ICD10-I70.0). Emphysema (ICD10-J43.9). Electronically Signed   By: Lovena Le M.D.   On: 01/07/2019 23:44   Dg Abd Portable 1v-small Bowel Obstruction Protocol-24 Hr Delay  Result Date: 01/09/2019 CLINICAL DATA:  Small-bowel obstruction. 24 hour delayed radiograph. EXAM: PORTABLE ABDOMEN - 1 VIEW COMPARISON:  01/08/2019; CT abdomen pelvis-01/07/2019 FINDINGS: Ingested enteric contrast remains throughout the colon  extending to the level the rectum, similar to the 12/2011 examination. There is persistent marked distention of a loop of small bowel within midline of the lower pelvis measuring approximately 4.5 cm diameter. No supine evidence of pneumoperitoneum. No pneumatosis or portal venous gas. Enteric staple line overlies the left lower abdomen. Interval removal of enteric tube. Atherosclerotic plaque within the abdominal aorta. Moderate scoliotic curvature of the thoracolumbar spine. Post multilevel paraspinal fusion and bilateral total hip replacements. Spinal stimulator device overlies the pelvis. Gluteal calcifications are seen bilaterally. IMPRESSION: 1. Enteric contrast seen throughout the colon extending to the level the rectum, with persistent distension of the small bowel, similar to the 09/13 examination, and again worrisome for small bowel obstruction. 2. Interval removal of enteric tube. Electronically Signed   By: Sandi Mariscal M.D.   On: 01/09/2019 07:11   Dg Abd Portable 1v-small Bowel Obstruction Protocol-initial, 8 Hr Delay  Result Date: 01/08/2019 CLINICAL DATA:  Small-bowel obstruction.  Delayed film. EXAM: PORTABLE ABDOMEN - 1 VIEW COMPARISON:  Abdominal radiograph earlier same day. FINDINGS: Enteric tube tip and side-port project over the stomach. Oral contrast material is demonstrated within the colon. What cirrhotic stimulator device projects over the pelvis. Bilateral hip arthroplasties. Lower thoracic and lumbar spine degenerative changes. IMPRESSION: Oral  contrast material has progressed the colon. Electronically Signed   By: Lovey Newcomer M.D.   On: 01/08/2019 14:46   Dg Abd Portable 1v-small Bowel Protocol-position Verification  Result Date: 01/08/2019 CLINICAL DATA:  Confirmation of nasogastric tube placement. EXAM: PORTABLE ABDOMEN - 1 VIEW COMPARISON:  CT abdomen pelvis 01/07/2019 FINDINGS: Contrast opacification of the stomach and proximal small bowel with tip of the NG tube position at the level of duodenal bulb. A side port is not visualized the suspect this is within the gastric lumen given the significant opacification of the rugal folds. Lumbar fixation hardware is noted. Redemonstration of the chest wall deformity in severe scoliotic curvature of the spine. IMPRESSION: NG tube tip at the level of duodenal bulb. Electronically Signed   By: Lovena Le M.D.   On: 01/08/2019 03:20    Micro Results    Recent Results (from the past 240 hour(s))  Urine culture     Status: Abnormal   Collection Time: 01/07/19 10:04 PM   Specimen: Urine, Clean Catch  Result Value Ref Range Status   Specimen Description URINE, CLEAN CATCH  Final   Special Requests ADDED 0142 01/08/2019  Final   Culture (A)  Final    >=100,000 COLONIES/mL GROUP B STREP(S.AGALACTIAE)ISOLATED TESTING AGAINST S. AGALACTIAE NOT ROUTINELY PERFORMED DUE TO PREDICTABILITY OF AMP/PEN/VAN SUSCEPTIBILITY. Performed at [facility], [place] [address] [place], [place] [address]    Report Status 01/09/2019 FINAL  Final  SARS Coronavirus 2 [facility] order, Performed in [facility] hospital lab) Nasopharyngeal Nasopharyngeal Swab     Status: None   Collection Time: 01/08/19 12:02 AM   Specimen: Nasopharyngeal Swab  Result Value Ref Range Status   SARS Coronavirus 2 NEGATIVE NEGATIVE Final    Comment: (NOTE) If result is NEGATIVE SARS-CoV-2 target nucleic acids are NOT DETECTED. The SARS-CoV-2 RNA is generally detectable in upper and lower  respiratory specimens during the acute  phase of infection. The lowest  concentration of SARS-CoV-2 viral copies this assay can detect is 250  copies / mL. A negative result does not preclude SARS-CoV-2 infection  and should not be used as the sole basis for treatment or other  patient management decisions.  A negative result  may occur with  improper specimen collection / handling, submission of specimen other  than nasopharyngeal swab, presence of viral mutation(s) within the  areas targeted by this assay, and inadequate number of viral copies  (<250 copies / mL). A negative result must be combined with clinical  observations, patient history, and epidemiological information. If result is POSITIVE SARS-CoV-2 target nucleic acids are DETECTED. The SARS-CoV-2 RNA is generally detectable in upper and lower  respiratory specimens dur ing the acute phase of infection.  Positive  results are indicative of active infection with SARS-CoV-2.  Clinical  correlation with patient history and other diagnostic information is  necessary to determine patient infection status.  Positive results do  not rule out bacterial infection or co-infection with other viruses. If result is PRESUMPTIVE POSTIVE SARS-CoV-2 nucleic acids MAY BE PRESENT.   A presumptive positive result was obtained on the submitted specimen  and confirmed on repeat testing.  While 2019 novel coronavirus  (SARS-CoV-2) nucleic acids may be present in the submitted sample  additional confirmatory testing may be necessary for epidemiological  and / or clinical management purposes  to differentiate between  SARS-CoV-2 and other Sarbecovirus currently known to infect humans.  If clinically indicated additional testing with an alternate test  methodology [phone]) is advised. The SARS-CoV-2 RNA is generally  detectable in upper and lower respiratory sp ecimens during the acute  phase of infection. The expected result is Negative. Fact Sheet for Patients:   StrictlyIdeas.no Fact Sheet for Healthcare Providers: BankingDealers.co.za This test is not yet approved or cleared by the [place] FDA and has been authorized for detection and/or diagnosis of SARS-CoV-2 by FDA under an Emergency Use Authorization (EUA).  This EUA will remain in effect (meaning this test can be used) for the duration of the COVID-19 declaration under Section 564(b)(1) of the Act, 21 U.S.C. section 360bbb-3(b)(1), unless the authorization is terminated or revoked sooner. Performed at [facility], [place] [address], [place], [place] [address]     Today   Subjective    [name] today has no headache,no chest abdominal pain,no new weakness tingling or numbness, feels much better wants to go home today.    Objective   Blood pressure 95/68, pulse 68, temperature 97.7 F (36.5 C), temperature source Oral, resp. rate 18, height 5\' 7"  (1.702 m), weight 43.7 kg, SpO2 98 %.   Intake/Output Summary (Last 24 hours) at 01/10/2019 0918 Last data filed at 01/09/2019 1400 Gross per 24 hour  Intake 248.83 ml  Output --  Net 248.83 ml    Exam Awake Alert, Oriented x 3, No new F.N deficits, Normal affect [place].AT,PERRAL Supple Neck,No JVD, No cervical lymphadenopathy appriciated.  Symmetrical Chest wall movement, Good air movement bilaterally, CTAB RRR,No Gallops,Rubs or new Murmurs, No Parasternal Heave +ve B.Sounds, Abd Soft, Non tender, No organomegaly appriciated, No rebound -guarding or rigidity. No Cyanosis, Clubbing or edema, No new Rash or bruise   Data Review   CBC w Diff:  Lab Results  Component Value Date   WBC 10.8 (H) 01/08/2019   HGB 15.4 (H) 01/08/2019   HGB 15.5 11/03/2016   HCT 47.0 (H) 01/08/2019   HCT 47.2 (H) 11/03/2016   PLT 150 01/08/2019   PLT 212 11/03/2016   LYMPHOPCT 45 09/02/2015   MONOPCT 8 09/02/2015   EOSPCT 2 09/02/2015   BASOPCT 0 09/02/2015    CMP:  Lab Results   Component Value Date   NA 144 01/10/2019   NA 144 11/03/2016  K 3.2 (L) 01/10/2019   CL 108 01/10/2019   CO2 26 01/10/2019   BUN <5 (L) 01/10/2019   BUN 19 11/03/2016   CREATININE 0.64 01/10/2019   CREATININE 0.80 02/28/2015   PROT 5.4 (L) 01/07/2019   PROT 6.5 11/03/2016   ALBUMIN 3.1 (L) 01/07/2019   ALBUMIN 4.4 11/03/2016   BILITOT 1.2 01/07/2019   BILITOT 0.4 11/03/2016   ALKPHOS 68 01/07/2019   AST 40 01/07/2019   ALT 42 01/07/2019  .   Total Time in preparing paper work, data evaluation and todays exam - 37 minutes  Lala Lund M.D on 01/10/2019 at [place]  [phone]

## 2019-01-10 NOTE — Progress Notes (Signed)
DISCHARGE NOTE HOME [name] to be discharged Home per MD order. Discussed prescriptions and follow up appointments with the patient. Prescriptions given to patient; medication list explained in detail. Patient verbalized understanding.  Skin clean, dry and intact without evidence of skin break down, no evidence of skin tears noted. IV catheter discontinued intact. Site without signs and symptoms of complications. Dressing and pressure applied. Pt denies pain at the site currently. No complaints noted.  Patient free of lines, drains, and wounds.   An After Visit Summary (AVS) was printed and given to the patient. Patient escorted via wheelchair, and discharged home via private auto.  Arlyss Repress, RN

## 2019-01-10 NOTE — TOC Initial Note (Signed)
Transition of Care [facility]) - Initial/Assessment Note    Patient Details  Name: [name] MRN: [id number] Date of Birth: [birth year]  Transition of Care [facility]) CM/SW Contact:    Bartholomew Crews, RN Phone Number: [phone] 01/10/2019, 10:59 AM  Clinical Narrative:                 Spoke with patient at the bedside. PTA home with spouse. Independent. PT expressed no recommendations for follow up PT and indicated teaching patient about slow progression of usual walking routine. Patient verbalized this teaching. Patient is able to follow up with PCP for disease progression, education, and hospital follow up. No home health set up at this time. Patient in agreement. No further transition of care needs identified. Patient to transition home today.   Expected Discharge Plan: Home/Self Care Barriers to Discharge: No Barriers Identified   Patient Goals and CMS Choice Patient states their goals for this hospitalization and ongoing recovery are:: go home and see her puppy CMS Medicare.gov Compare Post Acute Care list provided to:: Patient Choice offered to / list presented to : Patient  Expected Discharge Plan and Services Expected Discharge Plan: Home/Self Care In-house Referral: NA Discharge Planning Services: CM Consult Post Acute Care Choice: [place] arrangements for the past 2 months: Single Family Home Expected Discharge Date: 01/10/19               DME Arranged: N/A DME Agency: NA       HH Arranged: NA HH Agency: NA        Prior Living Arrangements/Services Living arrangements for the past 2 months: Single Family Home Lives with:: Self, Spouse Patient language and need for interpreter reviewed:: Yes Do you feel safe going back to the place where you live?: Yes      Need for Family Participation in Patient Care: Yes (Comment) Care giver support system in place?: Yes (comment)   Criminal Activity/Legal Involvement Pertinent to Current Situation/Hospitalization: No  - Comment as needed  Activities of Daily Living Home Assistive Devices/Equipment: Walker (specify type), Crutches, Blood pressure cuff, Shower chair without back, Dentures (specify type), Eyeglasses, Hand-held shower hose, Scales ADL Screening (condition at time of admission) Patient's cognitive ability adequate to safely complete daily activities?: Yes Is the patient deaf or have difficulty hearing?: No Does the patient have difficulty seeing, even when wearing glasses/contacts?: No Does the patient have difficulty concentrating, remembering, or making decisions?: No Patient able to express need for assistance with ADLs?: Yes Does the patient have difficulty dressing or bathing?: No Independently performs ADLs?: Yes (appropriate for developmental age) Does the patient have difficulty walking or climbing stairs?: No Weakness of Legs: None Weakness of Arms/Hands: None  Permission Sought/Granted Permission sought to share information with : Family Supports          Permission granted to share info w Relationship: spouse     Emotional Assessment Appearance:: Appears stated age Attitude/Demeanor/Rapport: Engaged Affect (typically observed): Accepting Orientation: : Oriented to Self, Oriented to Place, Oriented to  Time, Oriented to Situation Alcohol / Substance Use: Not Applicable Psych Involvement: No (comment)  Admission diagnosis:  Small bowel obstruction ([place]) [K56.609] Encounter for imaging study to confirm nasogastric (NG) tube placement [Z01.89] Acute cystitis with hematuria [N30.01] Patient Active Problem List   Diagnosis Date Noted  . SBO (small bowel obstruction) ([place]) 01/08/2019  . Acute blood loss anemia 11/20/2015  . Primary osteoarthritis of left hip 10/22/2015  . Preoperative clearance 10/10/2015  . TIA (transient  ischemic attack) 09/02/2015  . Alcohol intoxication ([place]) 09/02/2015  . Hypokalemia 09/02/2015  . Acute encephalopathy 09/02/2015  . First degree AV  block 03/02/2015  . DJD (degenerative joint disease) 07/31/2014  . Primary osteoarthritis of right hip 07/05/2014  . SVT (supraventricular tachycardia) ([place]) 02/27/2014  . Hyperlipidemia 02/27/2014  . History of tobacco abuse 02/23/2013  . Snake bite 01/04/2012  . Inguinal lymphadenopathy 10/01/2011  . UTI (urinary tract infection) 11/09/2008  . DEPRESSION 11/07/2008  . Mitral valve disorder 11/07/2008  . COPD (chronic obstructive pulmonary disease) ([place]) 11/07/2008   PCP:  Raina Mina., MD Pharmacy:   [facility], [place] [address] Phone: [phone] Fax: [place], [place] [address] Phone: [phone] Fax: [phone]     Social Determinants of Health (SDOH) Interventions    Readmission Risk Interventions No flowsheet data found.

## 2019-01-10 NOTE — Discharge Instructions (Signed)
Soft-Food Eating Plan (please follow this eating plan for the next 2 weeks before returning to your regular diet) A soft-food eating plan includes foods that are safe and easy to chew and swallow. Your health care provider or dietitian can help you find foods and flavors that fit into this plan. Follow this plan until your health care provider or dietitian says it is safe to start eating other foods and food textures. What are tips for following this plan? General guidelines   Take small bites of food, or cut food into pieces about  inch or smaller. Bite-sized pieces of food are easier to chew and swallow.  Eat moist foods. Avoid overly dry foods.  Avoid foods that: ? Are difficult to swallow, such as dry, chunky, crispy, or sticky foods. ? Are difficult to chew, such as hard, tough, or stringy foods. ? Contain nuts, seeds, or fruits.  Follow instructions from your dietitian about the types of liquids that are safe for you to swallow. You may be allowed to have: ? Thick liquids only. This includes only liquids that are thicker than honey. ? Thin and thick liquids. This includes all beverages and foods that become liquid at room temperature.  To make thick liquids: ? Purchase a commercial liquid thickening powder. These are available at grocery stores and pharmacies. ? Mix the thickener into liquids according to instructions on the label. ? Purchase ready-made thickened liquids. ? Thicken soup by pureeing, straining to remove chunks, and adding flour, potato flakes, or corn starch. ? Add commercial thickener to foods that become liquid at room temperature, such as milk shakes, yogurt, ice cream, gelatin, and sherbet.  Ask your health care provider whether you need to take a fiber supplement. Cooking  Cook meats so they stay tender and moist. Use methods like braising, stewing, or baking in liquid.  Cook vegetables and fruit until they are soft enough to be mashed with a fork.  Peel  soft, fresh fruits such as peaches, nectarines, and melons.  When making soup, make sure chunks of meat and vegetables are smaller than  inch.  Reheat leftover foods slowly so that a tough crust does not form. What foods are allowed? The items listed below may not be a complete list. Talk with your dietitian about what dietary choices are best for you. Grains Breads, muffins, pancakes, or waffles moistened with syrup, jelly, or butter. Dry cereals well-moistened with milk. Moist, cooked cereals. Well-cooked pasta and rice. Vegetables All soft-cooked vegetables. Shredded lettuce. Fruits All canned and cooked fruits. Soft, peeled fresh fruits. Strawberries. Dairy Milk. Cream. Yogurt. Cottage cheese. Soft cheese without the rind. Meats and other protein foods Tender, moist ground meat, poultry, or fish. Meat cooked in gravy or sauces. Eggs. Sweets and desserts Ice cream. Milk shakes. Sherbet. Pudding. Fats and oils Butter. Margarine. Olive, canola, sunflower, and grapeseed oil. Smooth salad dressing. Smooth cream cheese. Mayonnaise. Gravy. What foods are not allowed? The items listed bemay not be a complete list. Talk with your dietitian about what dietary choices are best for you. Grains Coarse or dry cereals, such as bran, granola, and shredded wheat. Tough or chewy crusty breads, such as [place] bread or baguettes. Breads with nuts, seeds, or fruit. Vegetables All raw vegetables. Cooked corn. Cooked vegetables that are tough or stringy. Tough, crisp, fried potatoes and potato skins. Fruits Fresh fruits with skins or seeds, or both, such as apples, pears, and grapes. Stringy, high-pulp fruits, such as papaya, pineapple, coconut, and mango. Fruit leather  and all dried fruit. Dairy Yogurt with nuts or coconut. Meats and other protein foods Hard, dry sausages. Dry meat, poultry, or fish. Meats with gristle. Fish with bones. Fried meat or fish. Lunch meat and hotdogs. Nuts and seeds.  Chunky peanut butter or other nut butters. Sweets and desserts Cakes or cookies that are very dry or chewy. Desserts with dried fruit, nuts, or coconut. Fried pastries. Very rich pastries. Fats and oils Cream cheese with fruit or nuts. Salad dressings with seeds or chunks. Summary  A soft-food eating plan includes foods that are safe and easy to swallow. Generally, the foods should be soft enough to be mashed with a fork.  Avoid foods that are dry, hard to chew, crunchy, sticky, stringy, or crispy.  Ask your health care provider whether you need to thicken your liquids and if you need to take a fiber supplement. This information is not intended to replace advice given to you by your health care provider. Make sure you discuss any questions you have with your health care provider. Document Released: 07/21/2007 Document Revised: 08/04/2018 Document Reviewed: 06/16/2016 Elsevier Patient Education  2020 [place].   Follow with Primary MD Raina Mina., MD in 7 days   Get CBC, CMP, Abdominal  X ray -  checked next visit within 1 week by Primary MD    Activity: As tolerated with Full fall precautions use walker/cane & assistance as needed  Disposition Home    Diet:Soft diet x 3 days then gradually advance to regular consistency as tolerated.Marland Kitchen    Special Instructions: If you have smoked or chewed Tobacco  in the last 2 yrs please stop smoking, stop any regular Alcohol  and or any Recreational drug use.  On your next visit with your primary care physician please Get Medicines reviewed and adjusted.  Please request your Prim.MD to go over all Hospital Tests and Procedure/Radiological results at the follow up, please get all Hospital records sent to your Prim MD by signing hospital release before you go home.  If you experience worsening of your admission symptoms, develop shortness of breath, life threatening emergency, suicidal or homicidal thoughts you must seek medical attention  immediately by calling 911 or calling your MD immediately  if symptoms less severe.  You Must read complete instructions/literature along with all the possible adverse reactions/side effects for all the Medicines you take and that have been prescribed to you. Take any new Medicines after you have completely understood and accpet all the possible adverse reactions/side effects.

## 2019-01-12 ENCOUNTER — Emergency Department (HOSPITAL_COMMUNITY): Payer: BC Managed Care – PPO

## 2019-01-12 ENCOUNTER — Encounter (HOSPITAL_COMMUNITY): Payer: Self-pay | Admitting: Emergency Medicine

## 2019-01-12 ENCOUNTER — Emergency Department (HOSPITAL_COMMUNITY)
Admission: EM | Admit: 2019-01-12 | Discharge: 2019-01-12 | Disposition: A | Discharge status: Home / Self Care | Payer: BC Managed Care – PPO | Attending: Emergency Medicine | Admitting: Emergency Medicine

## 2019-01-12 ENCOUNTER — Other Ambulatory Visit: Payer: Self-pay

## 2019-01-12 DIAGNOSIS — R1012 Left upper quadrant pain: Secondary | ICD-10-CM | POA: Diagnosis not present

## 2019-01-12 DIAGNOSIS — Z87891 Personal history of nicotine dependence: Secondary | ICD-10-CM | POA: Insufficient documentation

## 2019-01-12 DIAGNOSIS — Z7982 Long term (current) use of aspirin: Secondary | ICD-10-CM | POA: Diagnosis not present

## 2019-01-12 DIAGNOSIS — R112 Nausea with vomiting, unspecified: Secondary | ICD-10-CM | POA: Diagnosis not present

## 2019-01-12 DIAGNOSIS — Z79899 Other long term (current) drug therapy: Secondary | ICD-10-CM | POA: Insufficient documentation

## 2019-01-12 DIAGNOSIS — J449 Chronic obstructive pulmonary disease, unspecified: Secondary | ICD-10-CM | POA: Diagnosis not present

## 2019-01-12 HISTORY — DX: Unspecified intestinal obstruction, unspecified as to partial versus complete obstruction: K56.609

## 2019-01-12 LAB — LIPASE, BLOOD: Lipase: 50 U/L (ref 11–51)

## 2019-01-12 LAB — CBC
HCT: 48.2 % — ABNORMAL HIGH (ref 36.0–46.0)
Hemoglobin: 16.4 g/dL — ABNORMAL HIGH (ref 12.0–15.0)
MCH: 34.5 pg — ABNORMAL HIGH (ref 26.0–34.0)
MCHC: 34 g/dL (ref 30.0–36.0)
MCV: 101.5 fL — ABNORMAL HIGH (ref 80.0–100.0)
Platelets: 222 10*3/uL (ref 150–400)
RBC: 4.75 MIL/uL (ref 3.87–5.11)
RDW: 12.2 % (ref 11.5–15.5)
WBC: 6.4 10*3/uL (ref 4.0–10.5)
nRBC: 0 % (ref 0.0–0.2)

## 2019-01-12 LAB — COMPREHENSIVE METABOLIC PANEL
ALT: 76 U/L — ABNORMAL HIGH (ref 0–44)
AST: 75 U/L — ABNORMAL HIGH (ref 15–41)
Albumin: 3.7 g/dL (ref 3.5–5.0)
Alkaline Phosphatase: 77 U/L (ref 38–126)
Anion gap: 10 (ref 5–15)
BUN: 13 mg/dL (ref 8–23)
CO2: 26 mmol/L (ref 22–32)
Calcium: 9.4 mg/dL (ref 8.9–10.3)
Chloride: 105 mmol/L (ref 98–111)
Creatinine, Ser: 0.75 mg/dL (ref 0.44–1.00)
GFR calc Af Amer: >60 mL/min (ref >60)
GFR calc non Af Amer: >60 mL/min (ref >60)
Glucose, Bld: 78 mg/dL (ref 70–99)
Potassium: 3.9 mmol/L (ref 3.5–5.1)
Sodium: 141 mmol/L (ref 135–145)
Total Bilirubin: 0.9 mg/dL (ref 0.3–1.2)
Total Protein: 6.1 g/dL — ABNORMAL LOW (ref 6.5–8.1)

## 2019-01-12 MED ORDER — SODIUM CHLORIDE 0.9% FLUSH: 10.0000 mL | INTRAVENOUS | Status: DC | PRN

## 2019-01-12 MED ORDER — SODIUM CHLORIDE 0.9 % IV BOLUS
500.0000 mL | Freq: Once | INTRAVENOUS | Status: AC
  Administered 2019-01-12: 500 mL via INTRAVENOUS

## 2019-01-12 MED ORDER — PROMETHAZINE HCL 25 MG PO TABS: 25.0000 mg | ORAL_TABLET | Freq: Four times a day (QID) | ORAL | 0 refills | Status: DC | PRN

## 2019-01-12 MED ORDER — PROMETHAZINE HCL 25 MG/ML IJ SOLN
12.5000 mg | Freq: Once | INTRAMUSCULAR | Status: AC
  Administered 2019-01-12: 12.5 mg via INTRAVENOUS
  Filled 2019-01-12: qty 1

## 2019-01-12 MED ORDER — MILK AND MOLASSES ENEMA
1.0000 | Freq: Once | RECTAL | Status: DC
  Filled 2019-01-12: qty 240

## 2019-01-12 NOTE — Discharge Instructions (Addendum)
Return here as needed.  Follow-up with your doctor for a recheck.  Use an enema at home as well.

## 2019-01-12 NOTE — ED Triage Notes (Signed)
Patient reports persistent nausea/emesis this evening , mild left abdominal pain , no diarrhea or fever , patient stated recent hospitalization 01/07/19 for small bowel obstruction .

## 2019-01-12 NOTE — ED Notes (Signed)
Given dc instructions pt verbalizes understanding

## 2019-01-12 NOTE — ED Notes (Signed)
Patient transported to X-ray 

## 2019-01-12 NOTE — Consult Note (Addendum)
[facility] Consult Note  [name] [birth year]  [id number].    Requesting MD: Gerald Stabs lawyer PA-C Chief Complaint: Persistent nausea and vomiting left abdominal pain; recent hospitalization 912-01/10/2019 for SBO. Reason for Consult: ? small bowel obstruction  HPI: Patient is a 68 year old female with a significant history of COPD, degenerative disc disease, spinal cord injury, hyperlipidemia, IBS, incomplete right bundle branch block, depression, anxiety, carotid artery disease, PSVT.  She is was originally seen on 01/03/2019 for nausea and vomiting diagnosed with gastroenteritis and discharged after IV fluids and discharged on Zofran.  She returned on 01/07/2019 with the same symptoms.  CT of the abdomen showed a possible small bowel obstruction with transition point left lower quadrant.  She was admitted by medicine and placed on the small bowel protocol.  Oral contrast progressed to the colon on 913/20.  On 01/09/2019 she was still having some nausea or abdominal pain and distention had resolved and she was passing flatus and had 5 loose BMs.  She was on a full liquid diet and advance to a soft diet and discharged on 01/10/2019.  Today she returns to the ED again with reports of nausea and vomiting.  Mild left abdominal pain.  No diarrhea or fever. No BM since discharge.  Before 01/03/19 she had 1-2 BM's per day.  Work-up by the ED so far shows she is afebrile vital signs are stable.  Her CMP is completely normal except for slight elevation in her AST 75 and ALT 76.  Bilirubin 0.9.  WBC is normal, hemoglobin 16.4, hematocrit 48.2, platelets are 222,000.  2 view film this a.m. shows no dilated large or small bowel loops appreciated on the study.  Small bowel dilatation seen on prior exam is not there.  Still has enteric contrast in her colon.  No acute evidence of cardiopulmonary abnormality.  Patient has a history of prolapse rectum and underwent abdominal rectoplexy in [facility] 2011  with hx of constipation.  She is undergone rectal manometry by Dr. Leighton Ruff and endoscopy in 2018.  She then had a sacral stimulator placed by Dr. Marcello Moores with 90% improvement in her sx. Hyperexpanded lungs consistent with COPD and chronic bronchitic changes.   We are asked to see.    ROS: Review of Systems  Constitutional: Negative.   HENT: Negative.   Eyes: Negative.   Respiratory: Negative.   Cardiovascular: Negative.   Gastrointestinal: Positive for abdominal pain, constipation (no BM), nausea and vomiting. Negative for diarrhea.  Genitourinary: Negative.        Hard to start urine at times  Musculoskeletal: Negative.   Skin: Negative.   Neurological: Negative.   Endo/Heme/Allergies: Negative.   Psychiatric/Behavioral: Negative.     Family History  Problem Relation Age of Onset  . COPD Father   . Diabetes Mother   . Heart disease Mother   . Kidney disease Mother   . Hypertension Mother     Past Medical History:  Diagnosis Date  . Anxiety   . Back pain, chronic   . Bowel obstruction ([place])   . Complication of anesthesia    PT HAS POST OP URINARY RETENTION:  per patient "difficult stick- needs central line or picc"  04-06-2017 AT [facility] IV STARTED BY CRNA NO ISSUE  . COPD with emphysema ([place])   . DDD (degenerative disc disease), cervical    POST MULTIPLE SURGERY'S  . DDD (degenerative disc disease), lumbosacral    POST MULTIPLE SURGERY'S  . Difficult intravenous access   .  Fecal incontinence    once a week----  TREATED W/ SACRAL NERVE STIMULATOR PLACED 12/ 2018  . Heart murmur   . History of chronic bronchitis   . History of spinal cord injury 02/09/2007   PT FELL-- S/P  CERVICAL SPINE SURGERY  . History of urinary retention    PT HAS POST OP URINARY RETENTION DUE TO ANESTHESIA  . History of venomous snake bite 2013   RIGHT MIDDLE FINGER BY COOPERHEAD--- TREATMENT W/ ANTIVENOM  . Hyperlipemia   . IBS (irritable bowel syndrome)    flairs up occasionally esp  if stressed  . Incomplete right bundle branch block   . Major depression   . Mild carotid artery disease [facility]) cardiologist-  dr Claiborne Billings   mild bilateral ICA per duplex 04-12-2017  1-39%  . MVP (mitral valve prolapse)    no evidence stenosis or regurgitation per last echo 10-25-2015  . OA (osteoarthritis)   . PONV (postoperative nausea and vomiting)   . Pre-diabetes   . PSVT (paroxysmal supraventricular tachycardia) [facility])    cardiologist-- dr Claiborne Billings    Past Surgical History:  Procedure Laterality Date  . ABDOMINAL RECTOPEXY  2011       dr fuller  [facility])   prolapse rectum  . ANAL RECTAL MANOMETRY N/A 03/01/2017   Procedure: ANO RECTAL MANOMETRY;  Surgeon: Leighton Ruff, MD;  Location: [facility];  Service: Endoscopy;  Laterality: N/A;  . ANTERIOR CERVICAL DECOMP/DISCECTOMY FUSION  05-28-2000    dr Louanne Skye   C3-4  &  C7-T1  . ANTERIOR CERVICAL DECOMPRESSION LAMINECTOMY AND FUSION  02-09-2007    dr Arnoldo Morale   decompression/ laminectomy C3-4 & C5-6;  laminectomy C2; fusion C2-5  . BREAST SURGERY  1976   breast reduction  . COLONOSCOPY    . FORAMINOTOMY 1 LEVEL  11-03-1999   dr Louanne Skye  [facility]   left C7-T1 w/ decompression left C8  . KNEE SURGERY  AGE 65   bone spur    . LUMBAR LAMINECTOMY/ DECOMPRESSION WITH MET-RX  02-09-2005  dr Flavia Shipper   L2-3 laminectomy left cage; fusion L2-4  . POSTERIOR CERVICAL FUSION/FORAMINOTOMY  12-08-2000    dr Louanne Skye   right forminotomy C7-T1 and posterior fusion  . RIGHT THUMB COLLATERAL LIGAMENT RECONSTRUCTION  02-08-2003   dr Amedeo Plenty  . RIGHT THUMB LIGAMENT REPAIR, ARTHROTOMY, SYNOVECTOMY  10-26-2002    dr Amedeo Plenty  . [place] to 01/2007   total 14 surgery's  cervical and lumbar  (including fusion's, diskectomy, laminectomy's, foraminotomy's)  . TONSILLECTOMY    . TOTAL HIP ARTHROPLASTY Right 07/31/2014   Procedure: TOTAL HIP ARTHROPLASTY ANTERIOR APPROACH;  Surgeon: Renette Butters, MD;  Location: [facility];  Service: Orthopedics;  Laterality:  Right;  . TOTAL HIP ARTHROPLASTY Left 11/19/2015   Procedure: TOTAL HIP ARTHROPLASTY ANTERIOR APPROACH;  Surgeon: Renette Butters, MD;  Location: [facility];  Service: Orthopedics;  Laterality: Left;  . TRANSTHORACIC ECHOCARDIOGRAM  10-25-2015   dr t. Claiborne Billings   ef 60-655/  no evidence MV stenosis or regurgitation/    . TUBAL LIGATION      Social History:  reports that she quit smoking about 17 months ago. Her smoking use included cigarettes. She has a 31.50 pack-year smoking history. She has never used smokeless tobacco. She reports previous alcohol use of about 5.0 - 6.0 standard drinks of alcohol per week. She reports that she does not use drugs.   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 Bupropion Hcl Swelling  . Celecoxib Swelling  . Naproxen Swelling  . Methadone Nausea And Vomiting  . Morphine Itching    Prior to Admission medications   Medication Sig Start Date End Date Taking? Authorizing Provider  aspirin EC 81 MG tablet Take 81 mg by mouth at bedtime.   Yes [provider]  atenolol (TENORMIN) 25 MG tablet Take 0.5 tablets (12.5 mg total) by mouth 2 (two) times daily. 06/09/18  Yes Troy Sine, MD  atorvastatin (LIPITOR) 10 MG tablet Take 1 tablet (10 mg total) by mouth daily. Patient taking differently: Take 10 mg by mouth daily at 6 PM.  10/27/18  Yes Almyra Deforest, PA  baclofen (LIORESAL) 20 MG tablet Take 20 mg by mouth 3 (three) times daily as needed for muscle spasms.    Yes [provider]  bismuth subsalicylate (PEPTO BISMOL) 262 MG/15ML suspension Take 30 mLs by mouth every 6 (six) hours as needed for indigestion.   Yes [provider]  Calcium Carbonate-Vitamin D (CALCIUM + D PO) Take 1 tablet by mouth 2 (two) times daily. Calcium Magnesium Zinc and Vitamin D   Yes [provider]  clonazePAM (KLONOPIN) 1 MG tablet Take 1 mg by  mouth at bedtime.   Yes [provider]  dimenhyDRINATE (DRAMAMINE) 50 MG tablet Take 25 mg by mouth every 8 (eight) hours as needed for nausea.   Yes [provider]  fish oil-omega-3 fatty acids 1000 MG capsule Take 1 g by mouth at bedtime.    Yes [provider]  Multiple Vitamin (MULTIVITAMIN WITH MINERALS) TABS tablet Take 1 tablet by mouth daily.   Yes [provider]  omeprazole (PRILOSEC) 20 MG capsule Take 20 mg by mouth daily. 10/25/18  Yes [provider]  ondansetron (ZOFRAN) 4 MG tablet Take 1 tablet (4 mg total) by mouth every 6 (six) hours as needed for nausea or vomiting. [birth year]  Yes Delora Fuel, MD  promethazine (PHENERGAN) 25 MG tablet Take 0.5 tablets (12.5 mg total) by mouth every 8 (eight) hours as needed for nausea or vomiting. 01/10/19  Yes Thurnell Lose, MD  verapamil (CALAN-SR) 240 MG CR tablet TAKE 1 TABLET BY MOUTH DAILY AT BEDTIME Patient taking differently: Take 240 mg by mouth at bedtime.  06/09/18  Yes Troy Sine, MD     Blood pressure (!) 113/55, pulse (!) 59, temperature 98.9 F (37.2 C), temperature source Oral, resp. rate 17, SpO2 98 %. Physical Exam: Physical Exam Constitutional:      General: She is not in acute distress.    Appearance: Normal appearance. She is not ill-appearing, toxic-appearing or diaphoretic.     Comments: Thin cachectic woman, no distress currently  HENT:     Head: Normocephalic and atraumatic.     Mouth/Throat:     Mouth: Mucous membranes are moist.     Pharynx: Oropharynx is clear.  Eyes:     General: No scleral icterus.    Conjunctiva/sclera: Conjunctivae normal.     Comments: Pupils are equal  Neck:     Musculoskeletal: Normal range of motion and neck supple. No neck rigidity or muscular tenderness.     Vascular: No carotid bruit.  Cardiovascular:     Rate and Rhythm: Normal rate and regular rhythm.     Pulses: Normal pulses.     Heart sounds: Normal heart sounds. No  murmur.  Pulmonary:  Effort: Pulmonary effort is normal. No respiratory distress.     Breath sounds: Normal breath sounds. No stridor. No wheezing, rhonchi or rales.  Chest:     Chest wall: No tenderness.  Abdominal:     General: Bowel sounds are normal. There is distension (minimal distension + BS).     Comments: Loud aortic bruit  Genitourinary:    Comments: Rectal stimulator on the right buttocks, good tone with exam.  No stool in the vault Musculoskeletal:        General: No swelling, tenderness, deformity or signs of injury.     Right lower leg: No edema.     Left lower leg: No edema.  Lymphadenopathy:     Cervical: No cervical adenopathy.  Skin:    General: Skin is warm and dry.  Neurological:     General: No focal deficit present.     Mental Status: She is alert and oriented to person, place, and time.     Cranial Nerves: No cranial nerve deficit.  Psychiatric:        Mood and Affect: Mood normal.        Behavior: Behavior normal.        Thought Content: Thought content normal.        Judgment: Judgment normal.     Results for orders placed or performed during the hospital encounter of 01/12/19 (from the past 48 hour(s))  Lipase, blood     Status: None   Collection Time: 01/12/19  3:06 AM  Result Value Ref Range   Lipase 50 11 - 51 U/L    Comment: Performed at [facility], [place] [address] [place], [place] [address]  Comprehensive metabolic panel     Status: Abnormal   Collection Time: 01/12/19  3:06 AM  Result Value Ref Range   Sodium 141 135 - 145 mmol/L   Potassium 3.9 3.5 - 5.1 mmol/L    Comment: DELTA CHECK NOTED   Chloride 105 98 - 111 mmol/L   CO2 26 22 - 32 mmol/L   Glucose, Bld 78 70 - 99 mg/dL   BUN 13 8 - 23 mg/dL   Creatinine, Ser 0.75 0.44 - 1.00 mg/dL   Calcium 9.4 8.9 - 10.3 mg/dL   Total Protein 6.1 (L) 6.5 - 8.1 g/dL   Albumin 3.7 3.5 - 5.0 g/dL   AST 75 (H) 15 - 41 U/L   ALT 76 (H) 0 - 44 U/L   Alkaline Phosphatase 77 38 - 126 U/L    Total Bilirubin 0.9 0.3 - 1.2 mg/dL   GFR calc non Af Amer >60 >60 mL/min   GFR calc Af Amer >60 >60 mL/min   Anion gap 10 5 - 15    Comment: Performed at [facility], [place] [address], [place], [place] [address]  CBC     Status: Abnormal   Collection Time: 01/12/19  3:06 AM  Result Value Ref Range   WBC 6.4 4.0 - 10.5 K/uL   RBC 4.75 3.87 - 5.11 MIL/uL   Hemoglobin 16.4 (H) 12.0 - 15.0 g/dL   HCT 48.2 (H) 36.0 - 46.0 %   MCV 101.5 (H) 80.0 - 100.0 fL   MCH 34.5 (H) 26.0 - 34.0 pg   MCHC 34.0 30.0 - 36.0 g/dL   RDW 12.2 11.5 - 15.5 %   Platelets 222 150 - 400 K/uL   nRBC 0.0 0.0 - 0.2 %    Comment: Performed at [facility], [address] [place], [place] [address]   Dg Abd Acute 2+v W 1v Chest  Result Date: 01/12/2019 CLINICAL DATA:  Mid abdominal pain. Recent discharge from hospital for bowel obstruction. EXAM: DG ABDOMEN ACUTE W/ 1V CHEST COMPARISON:  Plain films of the abdomen dated 01/09/2019 and 01/08/2019. Chest x-ray dated 02/15/2015. FINDINGS: Single-view of the chest: Heart size and mediastinal contours are within normal limits. Lungs are hyperexpanded. Chronic bronchitic changes noted centrally. Lungs otherwise clear. No pleural effusion or pneumothorax seen. Osseous structures about the chest are unremarkable. Supine and upright views of the abdomen: Stable enteric contrast within the nondistended colon. The small bowel dilatation demonstrated on the previous exam is not convincingly seen on today's study. No dilated large or small bowel loops are appreciated on today's exam. Suture lines, presumably bowel related, again noted within the LEFT pelvis. No evidence of soft tissue mass or abnormal fluid collection. No evidence of free intraperitoneal air. No acute appearing osseous abnormality. IMPRESSION: 1. No dilated large or small bowel loops are appreciated on today's study. The small bowel dilatation demonstrated on previous exam is not convincingly seen on today's  study. 2. Stable enteric contrast within the nondistended colon. 3. No evidence of acute cardiopulmonary abnormality. Hyperexpanded lungs indicating COPD with associated chronic bronchitic change. Electronically Signed   By: Franki Cabot M.D.   On: 01/12/2019 09:23      Assessment/Plan COPD - ongoing tobacco use CAD HLD UTI 01/07/19  Abdominal pain, nausea and vomiting No BM x 48 hours with retained contrast in the colon from her SBP 01/07/19 Hx of IBS-C  Plan:  Dr. Lucia Gaskins has reviewed the studies and it is his opinion we need to treat her for constipation.  Recommending an enema.  If that is unsuccessful would recommend Medicine admit and evaluation.    We don't see anything that appears surgical.  If enema is successful she should be seen by her primary care and bowel regime should be initiated.  If she does not go home please call and we will follow with you.  Earnstine Regal [facility] 01/12/2019, 11:06 AM Pager: [phone] Consults: [phone]  Agree with above.  Alphonsa Overall, MD, [facility] Pager: [phone] Office phone:  [phone]

## 2019-01-16 NOTE — ED Provider Notes (Signed)
[facility] EMERGENCY DEPARTMENT Provider Note   CSN: [id number] Arrival date & time: 01/12/19  0246     History   Chief Complaint Chief Complaint  Patient presents with  . Emesis    HPI [name] is a 67 y.o. female.     HPI Patient presents to the emergency department with nausea and vomiting with abdominal discomfort.  Patient was recently admitted to the hospital for a bowel obstruction.  She feels like this is similar to those symptoms.  The patient states that she was seen by surgery in the hospital.  Patient states that she did not take any medications prior to arrival for symptoms.  She states she is not had a bowel movement since she left the hospital.  The patient denies chest pain, shortness of breath, headache,blurred vision, neck pain, fever, cough, weakness, numbness, dizziness, anorexia, edema,diarrhea, rash, back pain, dysuria, hematemesis, bloody stool, near syncope, or syncope. Past Medical History:  Diagnosis Date  . Anxiety   . Back pain, chronic   . Bowel obstruction ([place])   . Complication of anesthesia    PT HAS POST OP URINARY RETENTION:  per patient "difficult stick- needs central line or picc"  04-06-2017 AT [facility] IV STARTED BY CRNA NO ISSUE  . COPD with emphysema ([place])   . DDD (degenerative disc disease), cervical    POST MULTIPLE SURGERY'S  . DDD (degenerative disc disease), lumbosacral    POST MULTIPLE SURGERY'S  . Difficult intravenous access   . Fecal incontinence    once a week----  TREATED W/ SACRAL NERVE STIMULATOR PLACED 12/ 2018  . Heart murmur   . History of chronic bronchitis   . History of spinal cord injury 02/09/2007   PT FELL-- S/P  CERVICAL SPINE SURGERY  . History of urinary retention    PT HAS POST OP URINARY RETENTION DUE TO ANESTHESIA  . History of venomous snake bite 2013   RIGHT MIDDLE FINGER BY COOPERHEAD--- TREATMENT W/ ANTIVENOM  . Hyperlipemia   . IBS (irritable bowel syndrome)    flairs up  occasionally esp if stressed  . Incomplete right bundle branch block   . Major depression   . Mild carotid artery disease [facility]) cardiologist-  dr Claiborne Billings   mild bilateral ICA per duplex 04-12-2017  1-39%  . MVP (mitral valve prolapse)    no evidence stenosis or regurgitation per last echo 10-25-2015  . OA (osteoarthritis)   . PONV (postoperative nausea and vomiting)   . Pre-diabetes   . PSVT (paroxysmal supraventricular tachycardia) [facility])    cardiologist-- dr Claiborne Billings    Patient Active Problem List   Diagnosis Date Noted  . SBO (small bowel obstruction) ([place]) 01/08/2019  . Acute blood loss anemia 11/20/2015  . Primary osteoarthritis of left hip 10/22/2015  . Preoperative clearance 10/10/2015  . TIA (transient ischemic attack) 09/02/2015  . Alcohol intoxication ([place]) 09/02/2015  . Hypokalemia 09/02/2015  . Acute encephalopathy 09/02/2015  . First degree AV block 03/02/2015  . DJD (degenerative joint disease) 07/31/2014  . Primary osteoarthritis of right hip 07/05/2014  . SVT (supraventricular tachycardia) ([place]) 02/27/2014  . Hyperlipidemia 02/27/2014  . History of tobacco abuse 02/23/2013  . Snake bite 01/04/2012  . Inguinal lymphadenopathy 10/01/2011  . UTI (urinary tract infection) 11/09/2008  . DEPRESSION 11/07/2008  . Mitral valve disorder 11/07/2008  . COPD (chronic obstructive pulmonary disease) ([place]) 11/07/2008    Past Surgical History:  Procedure Laterality Date  . ABDOMINAL RECTOPEXY  2011  dr fuller  [facility])   prolapse rectum  . ANAL RECTAL MANOMETRY N/A 03/01/2017   Procedure: ANO RECTAL MANOMETRY;  Surgeon: Leighton Ruff, MD;  Location: [facility];  Service: Endoscopy;  Laterality: N/A;  . ANTERIOR CERVICAL DECOMP/DISCECTOMY FUSION  05-28-2000    dr Louanne Skye   C3-4  &  C7-T1  . ANTERIOR CERVICAL DECOMPRESSION LAMINECTOMY AND FUSION  02-09-2007    dr Arnoldo Morale   decompression/ laminectomy C3-4 & C5-6;  laminectomy C2; fusion C2-5  . BREAST SURGERY  1976    breast reduction  . COLONOSCOPY    . FORAMINOTOMY 1 LEVEL  11-03-1999   dr Louanne Skye  [facility]   left C7-T1 w/ decompression left C8  . KNEE SURGERY  AGE 32   bone spur    . LUMBAR LAMINECTOMY/ DECOMPRESSION WITH MET-RX  02-09-2005  dr Flavia Shipper   L2-3 laminectomy left cage; fusion L2-4  . POSTERIOR CERVICAL FUSION/FORAMINOTOMY  12-08-2000    dr Louanne Skye   right forminotomy C7-T1 and posterior fusion  . RIGHT THUMB COLLATERAL LIGAMENT RECONSTRUCTION  02-08-2003   dr Amedeo Plenty  . RIGHT THUMB LIGAMENT REPAIR, ARTHROTOMY, SYNOVECTOMY  10-26-2002    dr Amedeo Plenty  . [place] to 01/2007   total 14 surgery's  cervical and lumbar  (including fusion's, diskectomy, laminectomy's, foraminotomy's)  . TONSILLECTOMY    . TOTAL HIP ARTHROPLASTY Right 07/31/2014   Procedure: TOTAL HIP ARTHROPLASTY ANTERIOR APPROACH;  Surgeon: Renette Butters, MD;  Location: [facility];  Service: Orthopedics;  Laterality: Right;  . TOTAL HIP ARTHROPLASTY Left 11/19/2015   Procedure: TOTAL HIP ARTHROPLASTY ANTERIOR APPROACH;  Surgeon: Renette Butters, MD;  Location: [facility];  Service: Orthopedics;  Laterality: Left;  . TRANSTHORACIC ECHOCARDIOGRAM  10-25-2015   dr t. Claiborne Billings   ef 60-655/  no evidence MV stenosis or regurgitation/    . TUBAL LIGATION       OB History   No obstetric history on file.      Home Medications    Prior to Admission medications   Medication Sig Start Date End Date Taking? Authorizing Provider  aspirin EC 81 MG tablet Take 81 mg by mouth at bedtime.   Yes [provider]  atenolol (TENORMIN) 25 MG tablet Take 0.5 tablets (12.5 mg total) by mouth 2 (two) times daily. 06/09/18  Yes Troy Sine, MD  atorvastatin (LIPITOR) 10 MG tablet Take 1 tablet (10 mg total) by mouth daily. Patient taking differently: Take 10 mg by mouth daily at 6 PM.  10/27/18  Yes Almyra Deforest, PA  baclofen (LIORESAL) 20 MG tablet Take 20 mg by mouth 3 (three) times daily as needed for muscle spasms.    Yes [provider]  bismuth subsalicylate (PEPTO BISMOL) 262 MG/15ML suspension Take 30 mLs by mouth every 6 (six) hours as needed for indigestion.   Yes [provider]  Calcium Carbonate-Vitamin D (CALCIUM + D PO) Take 1 tablet by mouth 2 (two) times daily. Calcium Magnesium Zinc and Vitamin D   Yes [provider]  clonazePAM (KLONOPIN) 1 MG tablet Take 1 mg by mouth at bedtime.   Yes [provider]  dimenhyDRINATE (DRAMAMINE) 50 MG tablet Take 25 mg by mouth every 8 (eight) hours as needed for nausea.   Yes [provider]  fish oil-omega-3 fatty acids 1000 MG capsule Take 1 g by mouth at bedtime.    Yes [provider]  Multiple Vitamin (MULTIVITAMIN WITH MINERALS) TABS tablet Take 1 tablet by mouth  daily.   Yes [provider]  omeprazole (PRILOSEC) 20 MG capsule Take 20 mg by mouth daily. 10/25/18  Yes [provider]  ondansetron (ZOFRAN) 4 MG tablet Take 1 tablet (4 mg total) by mouth every 6 (six) hours as needed for nausea or vomiting. 05/02/15  Yes Delora Fuel, MD  verapamil (CALAN-SR) 240 MG CR tablet TAKE 1 TABLET BY MOUTH DAILY AT BEDTIME Patient taking differently: Take 240 mg by mouth at bedtime.  06/09/18  Yes Troy Sine, MD  promethazine (PHENERGAN) 25 MG tablet Take 1 tablet (25 mg total) by mouth every 6 (six) hours as needed for nausea or vomiting. 01/12/19   Dalia Heading, PA-C    Family History Family History  Problem Relation Age of Onset  . COPD Father   . Diabetes Mother   . Heart disease Mother   . Kidney disease Mother   . Hypertension Mother     Social History Social History   Tobacco Use  . Smoking status: Former Smoker    Packs/day: 0.75    Years: 42.00    Pack years: 31.50    Types: Cigarettes    Quit date: 08/08/2017    Years since quitting: 1.4  . Smokeless tobacco: Never Used  Substance Use Topics  . Alcohol use: Not Currently    Alcohol/week: 5.0 - 6.0 standard drinks    Types: 5 - 6  Glasses of wine per week    Comment: no longer drinks-stopped approx 6 yrs ago  . Drug use: No     Allergies   Bupropion hcl, Celecoxib, Naproxen, Methadone, and Morphine   Review of Systems Review of Systems  All other systems negative except as documented in the HPI. All pertinent positives and negatives as reviewed in the HPI. Physical Exam Updated Vital Signs BP (!) 132/59   Pulse 84   Temp 98.9 F (37.2 C) (Oral)   Resp 15   SpO2 96%   Physical Exam Vitals signs and nursing note reviewed.  Constitutional:      General: She is not in acute distress.    Appearance: She is well-developed.  HENT:     Head: Normocephalic and atraumatic.  Eyes:     Pupils: Pupils are equal, round, and reactive to light.  Neck:     Musculoskeletal: Normal range of motion and neck supple.  Cardiovascular:     Rate and Rhythm: Normal rate and regular rhythm.     Heart sounds: Normal heart sounds. No murmur. No friction rub. No gallop.   Pulmonary:     Effort: Pulmonary effort is normal. No respiratory distress.     Breath sounds: Normal breath sounds. No wheezing.  Abdominal:     General: Bowel sounds are normal. There is no distension.     Palpations: Abdomen is soft.     Tenderness: There is abdominal tenderness. There is no guarding or rebound.  Skin:    General: Skin is warm and dry.     Capillary Refill: Capillary refill takes less than 2 seconds.     Findings: No erythema or rash.  Neurological:     Mental Status: She is alert and oriented to person, place, and time.     Motor: No abnormal muscle tone.     Coordination: Coordination normal.  Psychiatric:        Behavior: Behavior normal.      ED Treatments / Results  Labs (all labs ordered are listed, but only abnormal results are displayed) Labs  Reviewed  COMPREHENSIVE METABOLIC PANEL - Abnormal; Notable for the following components:      Result Value   Total Protein 6.1 (*)    AST 75 (*)    ALT 76 (*)    All  other components within normal limits  CBC - Abnormal; Notable for the following components:   Hemoglobin 16.4 (*)    HCT 48.2 (*)    MCV 101.5 (*)    MCH 34.5 (*)    All other components within normal limits  LIPASE, BLOOD    EKG None  Radiology No results found.  Procedures Procedures (including critical care time)  Medications Ordered in ED Medications  sodium chloride 0.9 % bolus 500 mL (0 mLs Intravenous Stopped 01/12/19 0918)  promethazine (PHENERGAN) injection 12.5 mg (12.5 mg Intravenous Given 01/12/19 0827)     Initial Impression / Assessment and Plan / ED Course  I have reviewed the triage vital signs and the nursing notes.  Pertinent labs & imaging results that were available during my care of the patient were reviewed by me and considered in my medical decision making (see chart for details).        Spoke with general surgery who came and evaluated the patient and felt that she did not have a bowel obstruction at this time.  They feel that her issue may be more related to not having bowel movement.  The patient will do an enema at home along with stool softeners.  Patient is advised to return here for any worsening in her condition.  I did advise the patient to follow-up with her doctor along with the general surgeon.  Final Clinical Impressions(s) / ED Diagnoses   Final diagnoses:  Non-intractable vomiting with nausea, unspecified vomiting type  Left upper quadrant pain    ED Discharge Orders         Ordered    promethazine (PHENERGAN) 25 MG tablet  Every 6 hours PRN     01/12/19 1239           Dalia Heading, PA-C 01/16/19 0002    Isla Pence, MD 01/17/19 [phone]

## 2019-02-02 ENCOUNTER — Other Ambulatory Visit: Payer: Self-pay

## 2019-02-02 ENCOUNTER — Encounter: Payer: Self-pay | Admitting: Cardiovascular Disease

## 2019-02-02 ENCOUNTER — Ambulatory Visit (INDEPENDENT_AMBULATORY_CARE_PROVIDER_SITE_OTHER): Payer: BC Managed Care – PPO | Admitting: Cardiovascular Disease

## 2019-02-02 VITALS — BP 128/68 | HR 71 | Ht 67.0 in | Wt 101.0 lb

## 2019-02-02 DIAGNOSIS — I700 Atherosclerosis of aorta: Secondary | ICD-10-CM | POA: Diagnosis not present

## 2019-02-02 DIAGNOSIS — Z87891 Personal history of nicotine dependence: Secondary | ICD-10-CM

## 2019-02-02 DIAGNOSIS — I471 Supraventricular tachycardia: Secondary | ICD-10-CM

## 2019-02-02 DIAGNOSIS — J432 Centrilobular emphysema: Secondary | ICD-10-CM

## 2019-02-02 DIAGNOSIS — I2510 Atherosclerotic heart disease of native coronary artery without angina pectoris: Secondary | ICD-10-CM | POA: Diagnosis not present

## 2019-02-02 NOTE — Progress Notes (Signed)
Patient ID: [name], female   DOB: [birth year], 68 y.o.   MRN: [id number]    PCP: Dr. Gilford Rile  HPI: [name] is a 68 y.o. female resents to the office today for a 17 month followup cardiology evaluation.   [name] has a history of supraventricular tachycardia which has been fairly well-controlled on combination verapamil SR  plus beta blocker therapy with atenolol.  She has a history of underlying mitral valve prolapse, mild carotid disease, hyperlipidemia, as well as mild COPD. She had smoked for over 30 years but quit smoking.  She was hospitalized after being bitten by a copperhead snake and required 5 days in the hospital. Apparently her cardiac medicines were held at that time and during the hospitalization she had recurrent episodes of tachycardia dysrhythmia. As long as she takes her medicines, and she feels that her heart rhythm is controlled. There is rare intermittent palpitations.typically, these episodes may last for under a minute and typically resolve with the Valsalva maneuver if they don't resolve spontaneously.  When I saw her in 2017 she was unaware of any breakthrough arrhythmias.  At that time her blood pressure was low and there was mild orthostatic drop.  Her resting pulse was 60.  I recommended that she continue to take Verapamil SR 240 mg but I reduced her atenolol dose to 12.5  mg twice a day.  She underwent a follow-up echo Doppler study on 10/25/2015.  This showed an EF of 60-65%.  There was normal wall motion.  There was no significant valvular pathology.  She was given preoperative clearance to undergo hip surgery by Dr. Fredonia Highland which was done in July 2017 and she tolerated this well.  I last saw her, she had undergone  a panoramic dental x-ray.  Incidentally, she was noted to have calcified carotid atheromas of the internal carotid artery suggestive of atherosclerotic disease.  I scheduled her for carotid duplex imaging which showed heterogeneous plaque  bilaterally with narrowing in the 1-39% range.  She had normal subclavian arteries, and patent vertebral arteries with antegrade flow.  She resumed smoking after being off cigarettes for 2 years.  She resumed smoking after a death of a family member.    I last saw her in May 2019 after not having seen her in over a year., she denies any episodes of chest pain or awareness of palpitations.  She was concerned about her weight loss.  She had resumed smoking but quit 1 month previous to her last evaluation.  Her smoking duration has been over 40 years.  She admits to exertional dyspnea.  She denies any prolonged cough.  In July 2018, LDL cholesterol was 78 on atorvastatin and only 5 mg.  She has continued to be on atenolol 12.5 mg twice a day and verapamil 240 mg daily.  She is on aspirin.  In December 2018 carotid duplex imaging showed mild bilateral carotid plaque which was not hemodynamically significant.  Due to weight loss I referred her to [name], NP of pulmonary so that she would qualify for a low-dose CT of her chest to evaluate for possible lung CA screening protocol.  This apparently did not show any cancer but there was aortic atherosclerosis in addition to left main and two-vessel CAD noted on the CT scan.  She had mild diffuse bronchial wall thickening with mild centrilobular and paraseptal emphysema, with imaging findings suggestive of underlying COPD.  Since I last saw her, she tells me that she  had an episode of food poisoning associated with dehydration and small bowel obstruction leading to a hospitalization.  She admits to weight loss.  She denies any anginal type symptoms.   Past Medical History:  Diagnosis Date  . Anxiety   . Back pain, chronic   . Bowel obstruction ([place])   . Complication of anesthesia    PT HAS POST OP URINARY RETENTION:  per patient "difficult stick- needs central line or picc"  04-06-2017 AT [facility] IV STARTED BY CRNA NO ISSUE  . COPD with emphysema ([place])   . DDD  (degenerative disc disease), cervical    POST MULTIPLE SURGERY'S  . DDD (degenerative disc disease), lumbosacral    POST MULTIPLE SURGERY'S  . Difficult intravenous access   . Fecal incontinence    once a week----  TREATED W/ SACRAL NERVE STIMULATOR PLACED 12/ 2018  . Heart murmur   . History of chronic bronchitis   . History of spinal cord injury 02/09/2007   PT FELL-- S/P  CERVICAL SPINE SURGERY  . History of urinary retention    PT HAS POST OP URINARY RETENTION DUE TO ANESTHESIA  . History of venomous snake bite 2013   RIGHT MIDDLE FINGER BY COOPERHEAD--- TREATMENT W/ ANTIVENOM  . Hyperlipemia   . IBS (irritable bowel syndrome)    flairs up occasionally esp if stressed  . Incomplete right bundle branch block   . Major depression   . Mild carotid artery disease [facility]) cardiologist-  dr Claiborne Billings   mild bilateral ICA per duplex 04-12-2017  1-39%  . MVP (mitral valve prolapse)    no evidence stenosis or regurgitation per last echo 10-25-2015  . OA (osteoarthritis)   . PONV (postoperative nausea and vomiting)   . Pre-diabetes   . PSVT (paroxysmal supraventricular tachycardia) [facility])    cardiologist-- dr Claiborne Billings    Past Surgical History:  Procedure Laterality Date  . ABDOMINAL RECTOPEXY  2011       dr fuller  [facility])   prolapse rectum  . ANAL RECTAL MANOMETRY N/A 03/01/2017   Procedure: ANO RECTAL MANOMETRY;  Surgeon: Leighton Ruff, MD;  Location: [facility];  Service: Endoscopy;  Laterality: N/A;  . ANTERIOR CERVICAL DECOMP/DISCECTOMY FUSION  05-28-2000    dr Louanne Skye   C3-4  &  C7-T1  . ANTERIOR CERVICAL DECOMPRESSION LAMINECTOMY AND FUSION  02-09-2007    dr Arnoldo Morale   decompression/ laminectomy C3-4 & C5-6;  laminectomy C2; fusion C2-5  . BREAST SURGERY  1976   breast reduction  . COLONOSCOPY    . FORAMINOTOMY 1 LEVEL  11-03-1999   dr Louanne Skye  [facility]   left C7-T1 w/ decompression left C8  . KNEE SURGERY  AGE 89   bone spur    . LUMBAR LAMINECTOMY/ DECOMPRESSION WITH MET-RX   02-09-2005  dr Flavia Shipper   L2-3 laminectomy left cage; fusion L2-4  . POSTERIOR CERVICAL FUSION/FORAMINOTOMY  12-08-2000    dr Louanne Skye   right forminotomy C7-T1 and posterior fusion  . RIGHT THUMB COLLATERAL LIGAMENT RECONSTRUCTION  02-08-2003   dr Amedeo Plenty  . RIGHT THUMB LIGAMENT REPAIR, ARTHROTOMY, SYNOVECTOMY  10-26-2002    dr Amedeo Plenty  . [place] to 01/2007   total 14 surgery's  cervical and lumbar  (including fusion's, diskectomy, laminectomy's, foraminotomy's)  . TONSILLECTOMY    . TOTAL HIP ARTHROPLASTY Right 07/31/2014   Procedure: TOTAL HIP ARTHROPLASTY ANTERIOR APPROACH;  Surgeon: Renette Butters, MD;  Location: [place];  Service: Orthopedics;  Laterality: Right;  . TOTAL HIP ARTHROPLASTY  Left 11/19/2015   Procedure: TOTAL HIP ARTHROPLASTY ANTERIOR APPROACH;  Surgeon: Renette Butters, MD;  Location: [place];  Service: Orthopedics;  Laterality: Left;  . TRANSTHORACIC ECHOCARDIOGRAM  10-25-2015   dr t. Claiborne Billings   ef 60-655/  no evidence MV stenosis or regurgitation/    . TUBAL LIGATION      Allergies  Allergen Reactions  . Bupropion Hcl Swelling  . Celecoxib Swelling  . Naproxen Swelling  . Methadone Nausea And Vomiting  . Morphine Itching    Current Outpatient Medications  Medication Sig Dispense Refill  . aspirin EC 81 MG tablet Take 81 mg by mouth at bedtime.    Marland Kitchen atenolol (TENORMIN) 25 MG tablet Take 0.5 tablets (12.5 mg total) by mouth 2 (two) times daily. 90 tablet 3  . atorvastatin (LIPITOR) 10 MG tablet Take 1 tablet (10 mg total) by mouth daily. (Patient taking differently: Take 10 mg by mouth daily at 6 PM. ) 90 tablet 1  . baclofen (LIORESAL) 20 MG tablet Take 20 mg by mouth 3 (three) times daily as needed for muscle spasms.     . Calcium Carbonate-Vitamin D (CALCIUM + D PO) Take 1 tablet by mouth 2 (two) times daily. Calcium Magnesium Zinc and Vitamin D    . clonazePAM (KLONOPIN) 1 MG tablet Take 1 mg by mouth at bedtime.    . fish oil-omega-3 fatty acids 1000 MG  capsule Take 1 g by mouth at bedtime.     . Multiple Vitamin (MULTIVITAMIN WITH MINERALS) TABS tablet Take 1 tablet by mouth daily.    Marland Kitchen omeprazole (PRILOSEC) 20 MG capsule Take 20 mg by mouth daily.    . verapamil (CALAN-SR) 240 MG CR tablet TAKE 1 TABLET BY MOUTH DAILY AT BEDTIME (Patient taking differently: Take 240 mg by mouth at bedtime. ) 90 tablet 3   No current facility-administered medications for this visit.     Social History   Socioeconomic History  . Marital status: Married    Spouse name: CARROLL  . Number of children: 0  . Years of education: 16  . Highest education level: Bachelor's degree (e.g., BA, AB, BS)  Occupational History  . Occupation: Nursing-RN    RETIRED    Employer: [employer]  . Financial resource strain: Not hard at all  . Food insecurity    Worry: Never true    Inability: Never true  . Transportation needs    Medical: No    Non-medical: No  Tobacco Use  . Smoking status: Former Smoker    Packs/day: 0.75    Years: 42.00    Pack years: 31.50    Types: Cigarettes    Quit date: 08/08/2017    Years since quitting: 1.4  . Smokeless tobacco: Never Used  Substance and Sexual Activity  . Alcohol use: Not Currently    Alcohol/week: 5.0 - 6.0 standard drinks    Types: 5 - 6 Glasses of wine per week    Comment: no longer drinks-stopped approx 6 yrs ago  . Drug use: No  . Sexual activity: Yes    Birth control/protection: None  Lifestyle  . Physical activity    Days per week: 7 days    Minutes per session: 60 min  . Stress: Only a little  Relationships  . Social connections    Talks on phone: More than three times a week    Gets together: Once a week    Attends religious service: Never    Active  member of club or organization: Yes    Attends meetings of clubs or organizations: More than 4 times per year    Relationship status: Married  . Intimate partner violence    Fear of current or ex partner: No    Emotionally abused: No     Physically abused: No    Forced sexual activity: No  Other Topics Concern  . Not on file  Social History Narrative  . Not on file   Social history is notable in that she is married. She has one stepchild and 2 stepgrandchildren. She does walk. She has resumed tobacco.  Family History  Problem Relation Age of Onset  . COPD Father   . Diabetes Mother   . Heart disease Mother   . Kidney disease Mother   . Hypertension Mother     ROS General: Negative; No fevers, chills, or night sweats;  HEENT: Negative; No changes in vision or hearing, sinus congestion, difficulty swallowing Pulmonary: positive for mild COPD; No cough, wheezing, shortness of breath, hemoptysis Cardiovascular: Negative; No chest pain, presyncope, syncope, palpitations GI: Positive for irritable bowel syndrome GU: Negative; No dysuria, hematuria, or difficulty voiding Musculoskeletal: Status post left hip replacement surgery. Hematologic/Oncology: Negative; no easy bruising, bleeding Endocrine: Negative; no heat/cold intolerance; no diabetes Neuro: Negative; no changes in balance, headaches Skin: Negative; No rashes or skin lesions Psychiatric: positive for mild depression for which she takes Zoloft.; No behavioral problems,  Sleep: Negative; No snoring, daytime sleepiness, hypersomnolence, bruxism, restless legs, hypnogognic hallucinations, no cataplexy Other comprehensive 14 point system review is negative.   PE BP 128/68   Pulse 71   Ht 5' 7" (1.702 m)   Wt 101 lb (45.8 kg)   SpO2 96%   BMI 15.82 kg/m    Repeat blood pressure by me was 138/70  Wt Readings from Last 3 Encounters:  02/02/19 101 lb (45.8 kg)  01/09/19 96 lb 5.5 oz (43.7 kg)  11/23/18 100 lb (45.4 kg)   General: Alert, oriented, no distress.  Skin: normal turgor, no rashes, warm and dry HEENT: Normocephalic, atraumatic. Pupils equal round and reactive to light; sclera anicteric; extraocular muscles intact;  Nose without nasal  septal hypertrophy Mouth/Parynx benign; Mallinpatti scale 2 Neck: No JVD, no carotid bruits; normal carotid upstroke Lungs: Decreased breath sounds without wheezing Chest wall: without tenderness to palpitation Heart: PMI not displaced, RRR, s1 s2 normal, 1/6 systolic murmur, no diastolic murmur, no rubs, gallops, thrills, or heaves Abdomen: soft, nontender; no hepatosplenomehaly, BS+; abdominal aorta nontender and not dilated by palpation. Back: no CVA tenderness Pulses 2+ Musculoskeletal: full range of motion, normal strength, no joint deformities Extremities: no clubbing cyanosis or edema, Homan's sign negative  Neurologic: grossly nonfocal; Cranial nerves grossly wnl Psychologic: Normal mood and affect   ECG (independently read by me): NSR at 71; Probable bistrial enlargement QS V1-26 Aug 2017 ECG (independently read by me): Sinus bradycardia 55 bpm.  QS complex V1 V2.  Normal intervals.  No ectopy.  May 2018 ECG (independently read by me): Sinus bradycardia 56 bpm.  QS V1, V2.  Mild RV conduction delay.  Normal intervals.  No ST segment changes.  November 2017 ECG (independently read by me): Sinus bradycardia 52 bpm.  QRS complex V1 V2.  Normal intervals.  June 2017 ECG (independently read by me): Normal sinus rhythm at 60 bpm.  First-degree AV block with a PR interval of 220 ms.  No significant ST changes.  Incomplete right bundle branch block.  November 2016 ECG (independently read by me): normal sinus rhythm with first-degree heart block with a PR interval at 270 ms.  QRS complex V1 V2.  November 2015 ECG (independently read by me.  (: Normal sinus rhythm at 64 bpm.  Borderline first degree AV block with a PR interval at 204 ms.  Mild RV conduction delay.  Anteroseptal Q waves, unchanged  October 2014ECG: Sinus rhythm with incomplete right bundle branch block. Anteroseptal Q waves unchanged.  LABS:  BMP Latest Ref Rng & Units 01/12/2019 01/10/2019 01/08/2019  Glucose 70 - 99  mg/dL 78 69(L) 88  BUN 8 - 23 mg/dL 13 <5(L) 20  Creatinine 0.44 - 1.00 mg/dL 0.75 0.64 0.77  BUN/Creat Ratio 12 - 28 - - -  Sodium 135 - 145 mmol/L 141 144 138  Potassium 3.5 - 5.1 mmol/L 3.9 3.2(L) 3.9  Chloride 98 - 111 mmol/L 105 108 105  CO2 22 - 32 mmol/L 26 26 21(L)  Calcium 8.9 - 10.3 mg/dL 9.4 8.3(L) 8.6(L)   Hepatic Function Latest Ref Rng & Units 01/12/2019 01/07/2019 01/03/2019  Total Protein 6.5 - 8.1 g/dL 6.1(L) 5.4(L) 7.0  Albumin 3.5 - 5.0 g/dL 3.7 3.1(L) 4.0  AST 15 - 41 U/L 75(H) 40 28  ALT 0 - 44 U/L 76(H) 42 25  Alk Phosphatase 38 - 126 U/L 77 68 86  Total Bilirubin 0.3 - 1.2 mg/dL 0.9 1.2 1.4(H)  Bilirubin, Direct 0.0 - 0.3 mg/dL - - -   CBC Latest Ref Rng & Units 01/12/2019 01/08/2019 01/07/2019  WBC 4.0 - 10.5 K/uL 6.4 10.8(H) 12.4(H)  Hemoglobin 12.0 - 15.0 g/dL 16.4(H) 15.4(H) 14.8  Hematocrit 36.0 - 46.0 % 48.2(H) 47.0(H) 44.9  Platelets 150 - 400 K/uL 222 150 176   Lab Results  Component Value Date   MCV 101.5 (H) 01/12/2019   MCV 101.5 (H) 01/08/2019   MCV 101.6 (H) 01/07/2019   Lab Results  Component Value Date   TSH 1.010 11/03/2016   Lipid Panel     Component Value Date/Time   CHOL 146 11/03/2016 0920   TRIG 111 11/03/2016 0920   HDL 46 11/03/2016 0920   CHOLHDL 3.2 11/03/2016 0920   CHOLHDL 3.3 02/28/2015 1023   VLDL 30 02/28/2015 1023   LDLCALC 78 11/03/2016 0920     RADIOLOGY: No results found.  IMPRESSION:  1. SVT (supraventricular tachycardia) ([place])   2. Aortic atherosclerosis ([place])   3. Coronary artery calcification seen on CAT scan   4. History of tobacco abuse   5. Centrilobular emphysema ([place])     ASSESSMENT AND PLAN: [name] is a 68 year old female who has a history of SVT which had been fairly well-controlled on verapamil 240 mg as well as atenolol 12.5 mg twice a day.  She is unaware of any breakthrough SVT episodes.  She has a greater than 40-year history of tobacco use and in the past has had intermittently quit  for short duration. When I last saw her I recommended she is screening chest CT imaging which did not reveal any malignancy but revealed emphysema in addition to aortic as well as coronary atherosclerosis.  She denies any anginal type symptoms.  She had undergone myocardial perfusion imaging in August 2020 which showed normal perfusion without ischemia or infarction, EF 66%.  She had recently had recurrent palpitations for nausea vomiting dehydration and ultimately was felt to have small bowel obstruction.  During that setting laboratory was notable for elevation of liver function studies.  Hemoglobin was  16.4 with mild macrocytic indices at 101.5.  Will be worthwhile for her to have follow-up laboratory to make certain her LFTs have improved.  She has been on very low-dose statin therapy with atorvastatin 10 mg in addition to omega-3 fatty acids which may need to be adjusted if LFTs remain elevated.  Presently there is no wheezing.  She is unaware of any palpitations on her current dose of atenolol 12.5 mg twice a day in addition to verapamil.  She is undergoing yearly chest CT imaging light of her tobacco history.  I will see her in 1 year for reevaluation if problems arise.   Time spent: 25 minutes Troy Sine, MD, [facility]  02/04/2019 2:39 PM

## 2019-02-02 NOTE — Patient Instructions (Signed)
Follow-Up: You will need a follow up appointment in 12 months.  Please call our office 2 months in advance, august 2021 to schedule this, October 2021 appointment.  You may see Shelva Majestic, MD or one of the following Advanced Practice Providers on your designated Care Team:  Almyra Deforest, PA-C  Fabian Sharp, [place]       Medication Instructions:  The current medical regimen is effective;  continue present plan and medications as directed. Please refer to the Current Medication list given to you today. If you need a refill on your cardiac medications before your next appointment, please call your pharmacy. Labwork: When you have labs (blood work) and your tests are completely normal, you will receive your results ONLY by [place] (if you have MyChart) -OR- A paper copy in the mail.  At [facility], you and your health needs are our priority.  As part of our continuing mission to provide you with exceptional heart care, we have created designated Provider Care Teams.  These Care Teams include your primary Cardiologist (physician) and Advanced Practice Providers (APPs -  Physician Assistants and Nurse Practitioners) who all work together to provide you with the care you need, when you need it.  Thank you for choosing [facility] at [facility]!!

## 2019-02-04 ENCOUNTER — Encounter: Payer: Self-pay | Admitting: Cardiovascular Disease

## 2019-02-26 IMAGING — RF DG PELVIS 1-2V
1 series · 2 of 2 positions shown · non-contrast
Comparison: None.

CLINICAL DATA: Interstim implant

EXAM:
PELVIS - 1-2 VIEW

[Series 1: run · 2 of 2 slices shown]
[im 1/2]
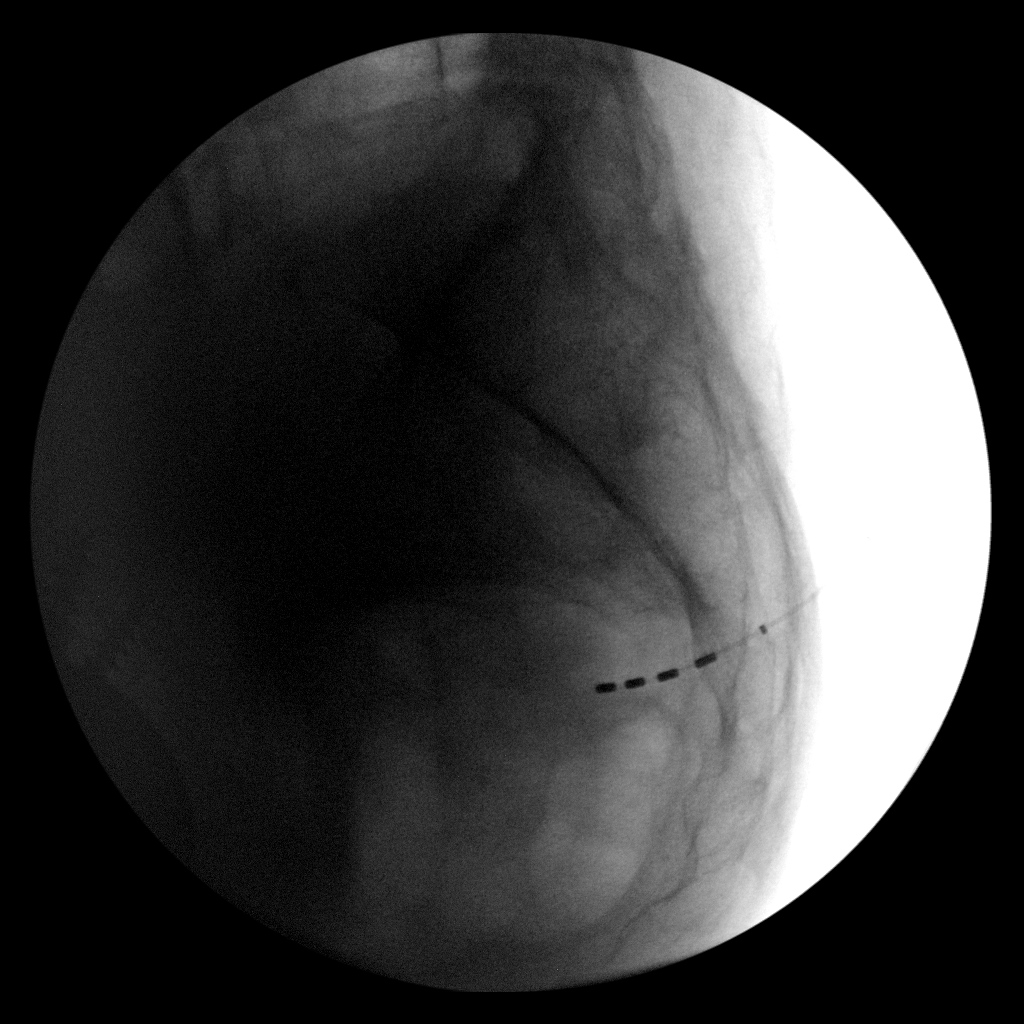
[im 2/2]
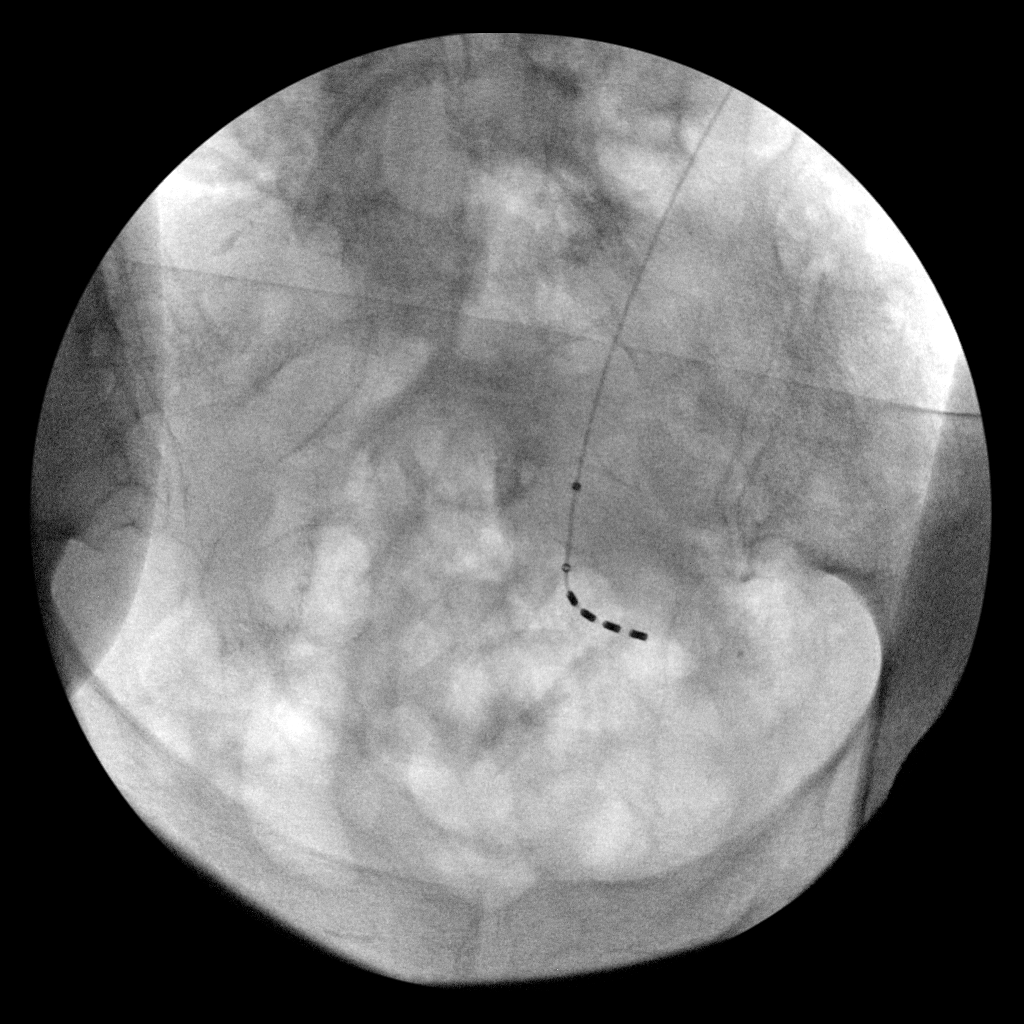

[2 of 2 positions shown; findings below may reference images not displayed]

FINDINGS: Interstim implant projects over the left side of the sacrum. No
visible complicating feature.
IMPRESSION: As above.

## 2019-06-06 ENCOUNTER — Other Ambulatory Visit: Payer: Self-pay

## 2019-06-06 MED ORDER — ATORVASTATIN CALCIUM 10 MG PO TABS: 10.0000 mg | ORAL_TABLET | Freq: Every day | ORAL | 0 refills | Status: DC

## 2019-06-07 ENCOUNTER — Telehealth: Payer: Self-pay | Admitting: Cardiovascular Disease

## 2019-06-07 MED ORDER — ATORVASTATIN CALCIUM 10 MG PO TABS: 10.0000 mg | ORAL_TABLET | Freq: Every day | ORAL | 2 refills | Status: DC

## 2019-06-07 NOTE — Telephone Encounter (Signed)
Rx has been sent to the pharmacy electronically. ° °

## 2019-06-07 NOTE — Telephone Encounter (Signed)
New Message   Pt c/o medication issue:  1. Name of Medication: atorvastatin (LIPITOR) 10 MG tablet   2. How are you currently taking this medication (dosage and times per day)?   3. Are you having a reaction (difficulty breathing--STAT)?   4. What is your medication issue? Patient is calling because she went to get a refill on her medication that she normal gets for 90 days but she said she was told that she needed to make an appt but she is not due until Oct. She is wanting to know if that is the case. If not she would like a 90 day refill request sent. Please advise.

## 2019-06-12 ENCOUNTER — Other Ambulatory Visit: Payer: Self-pay | Admitting: Cardiovascular Disease

## 2019-06-12 MED ORDER — VERAPAMIL HCL ER 240 MG PO TBCR: 240.0000 mg | EXTENDED_RELEASE_TABLET | Freq: Every day | ORAL | 3 refills | Status: DC

## 2019-06-12 MED ORDER — ATENOLOL 25 MG PO TABS: 12.5000 mg | ORAL_TABLET | Freq: Two times a day (BID) | ORAL | 3 refills | Status: DC

## 2019-06-12 NOTE — Telephone Encounter (Signed)
*  STAT* If patient is at the pharmacy, call can be transferred to refill team.   1. Which medications need to be refilled? (please list name of each medication and dose if known)   atenolol (TENORMIN) 25 MG tablet  verapamil (CALAN-SR) 240 MG CR tablet     2. Which pharmacy/location (including street and city if local pharmacy) is medication to be sent to? [place], [place]  3. Do they need a 30 day or 90 day supply? 90 day supply

## 2019-07-11 ENCOUNTER — Other Ambulatory Visit: Payer: Self-pay | Admitting: Cardiovascular Disease

## 2019-07-11 NOTE — Telephone Encounter (Signed)
*  STAT* If patient is at the pharmacy, call can be transferred to refill team.   1. Which medications need to be refilled? (please list name of each medication and dose if known) atenolol (TENORMIN) 25 MG tablet  2. Which pharmacy/location (including street and city if local pharmacy) is medication to be sent to? [place], [place]  3. Do they need a 30 day or 90 day supply? 90  Patient states that this medication cannot get refilled due to needing an appt. Patient is not due until October 2021.

## 2019-07-11 NOTE — Telephone Encounter (Signed)
Refill sent in 06/12/19 with 3 refills.

## 2019-08-11 IMAGING — CT CT CHEST LUNG CANCER SCREENING LOW DOSE W/O CM
2 of 3 series · 14 of 36 positions shown, 17 images · non-contrast
Comparison: Chest CT 01/17/2011.

CLINICAL DATA: 66-year-old female former smoker (quit in July 2017) with 31 pack-year history of smoking. Lung cancer screening
examination.

EXAM:
CT CHEST WITHOUT CONTRAST LOW-DOSE FOR LUNG CANCER SCREENING
TECHNIQUE: Multidetector CT imaging of the chest was performed following the
standard protocol without IV contrast.

[Series 2: thorax 5.0 i31f 3 · axial · 0.62mm/px · z∈[-396,-56]mm · 11 of 80 slices shown, 14 images]
[im 6/80  mediastinal]
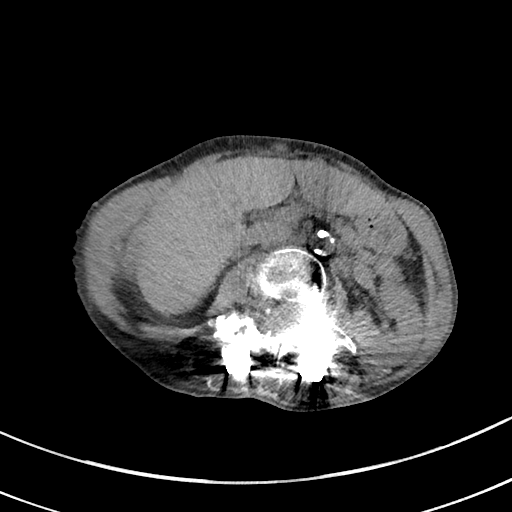
[im 6/80  lung]
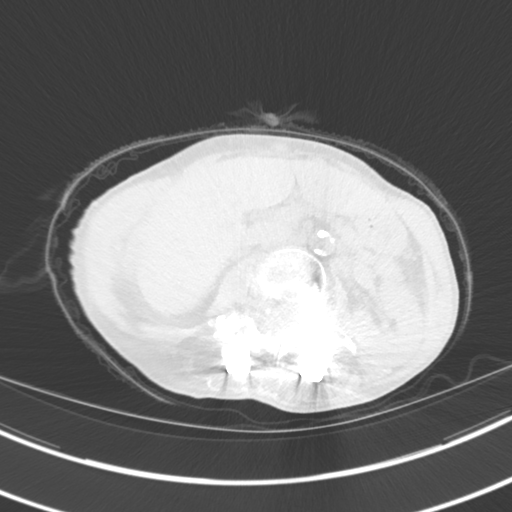
[im 12/80  lung]
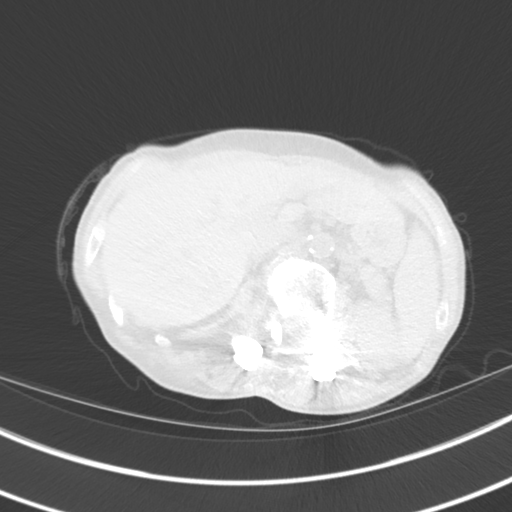
[im 18/80  lung]
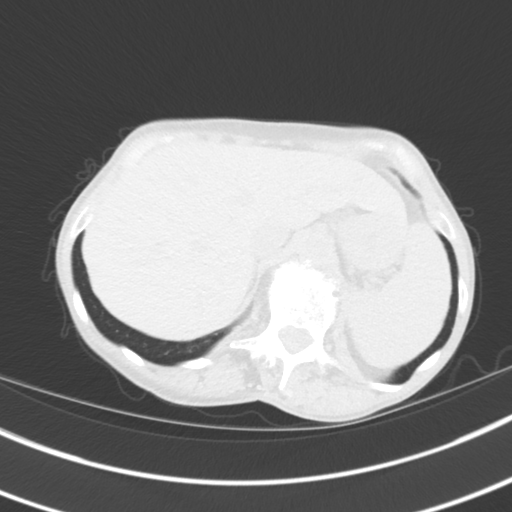
[im 27/80  lung]
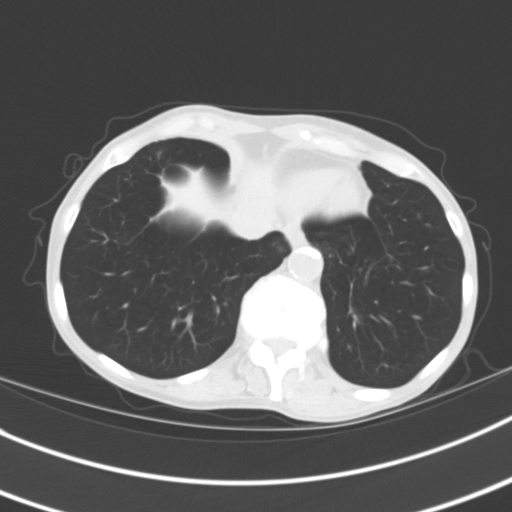
[im 33/80  mediastinal]
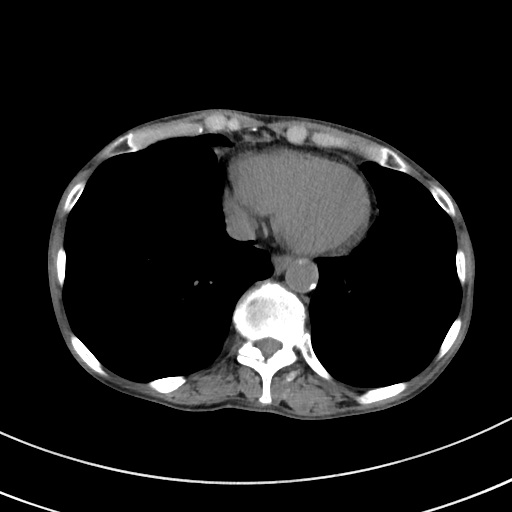
[im 33/80  lung]
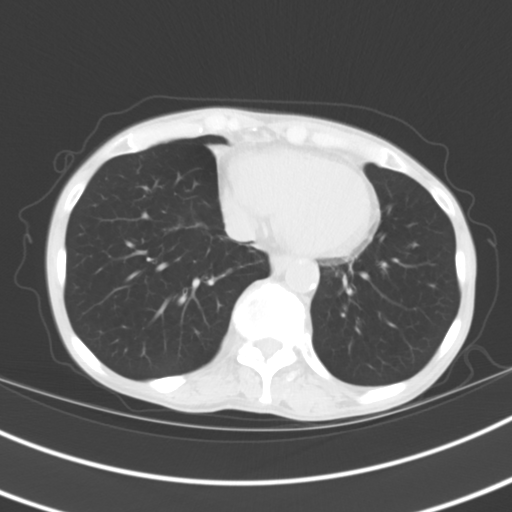
[im 41/80  lung]
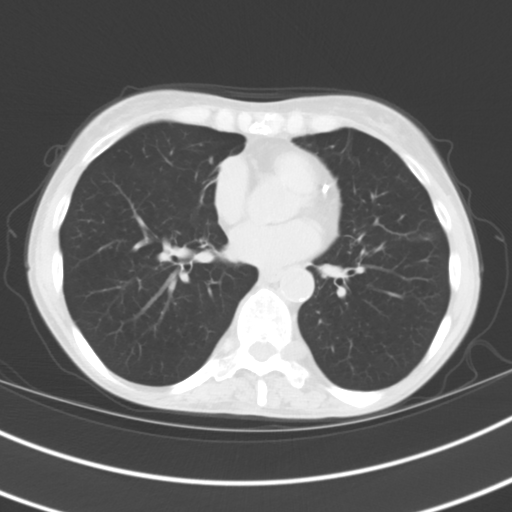
[im 47/80  lung]
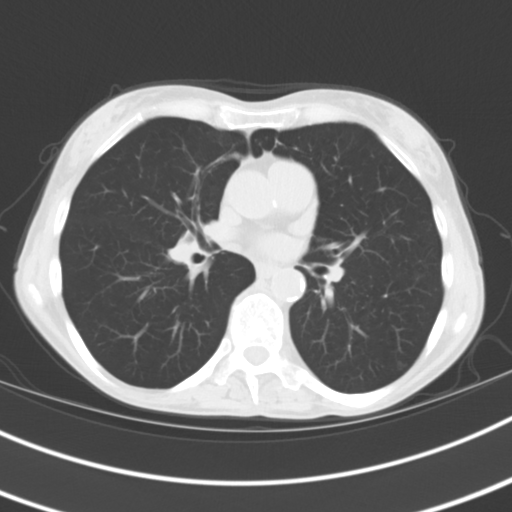
[im 53/80  lung]
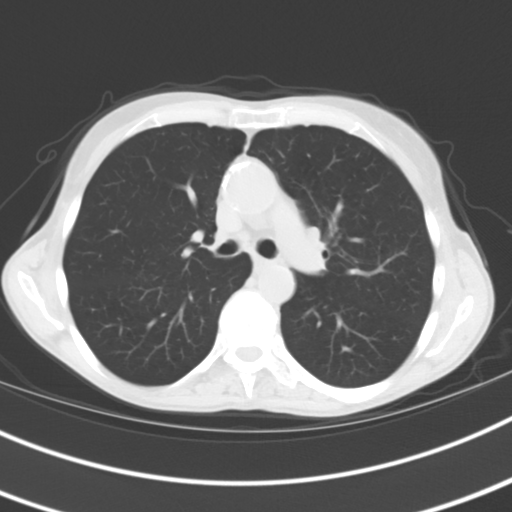
[im 62/80  mediastinal]
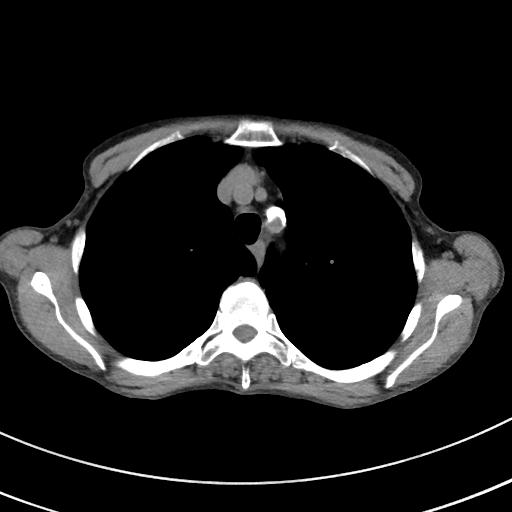
[im 62/80  lung]
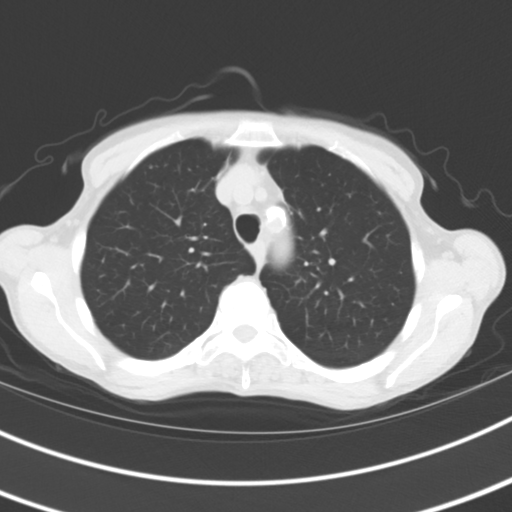
[im 68/80  lung]
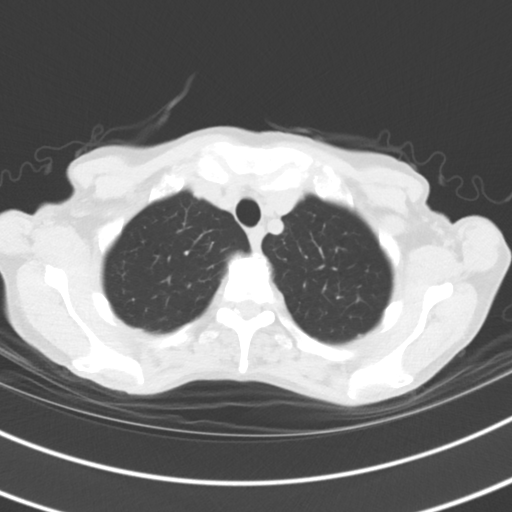
[im 74/80  lung]
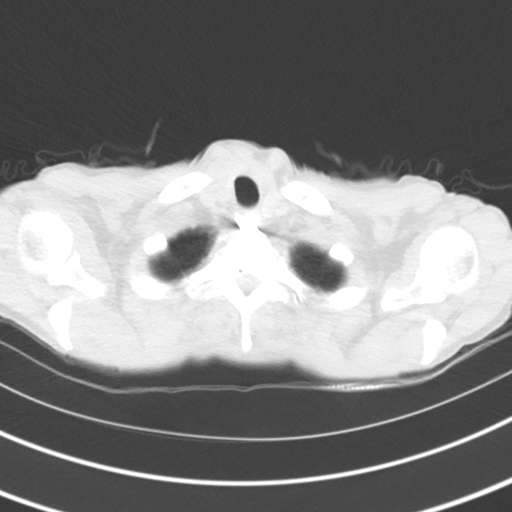

[Series 5: coronal · coronal · 0.60mm/px · 3 of 93 slices shown]
[im 19/93  lung]
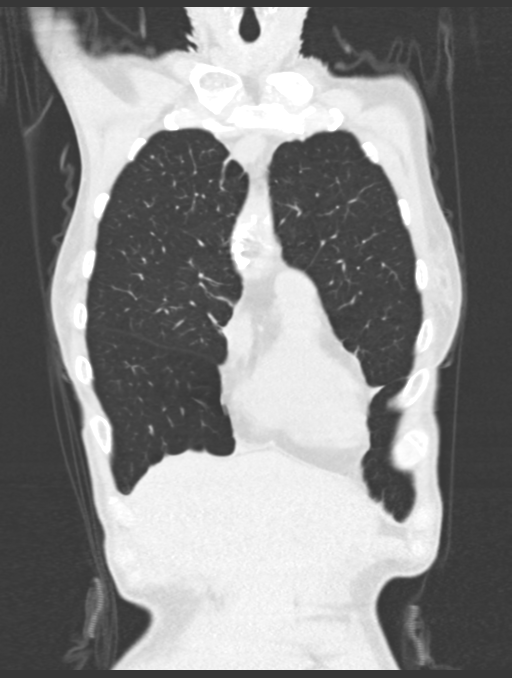
[im 37/93  lung]
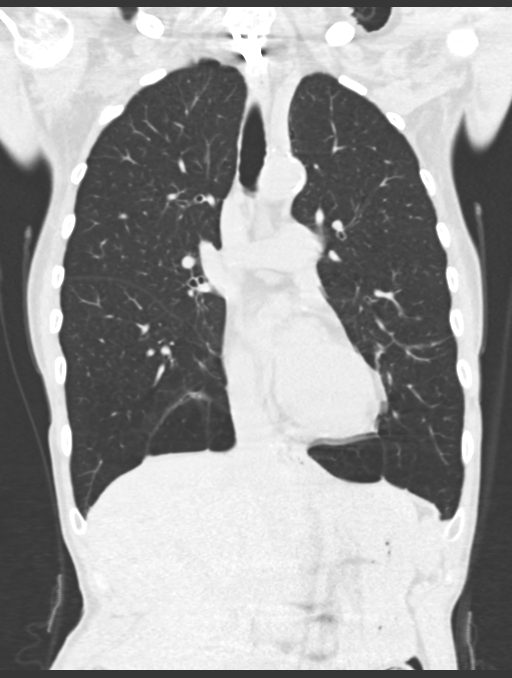
[im 56/93  lung]
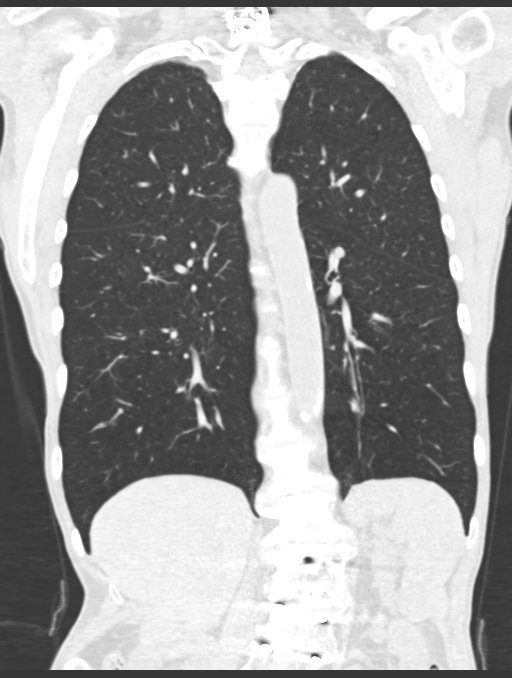

[14 of 36 positions shown; findings below may reference images not displayed]

FINDINGS: Cardiovascular: Heart size is normal. There is no significant
pericardial fluid, thickening or pericardial calcification. There is
aortic atherosclerosis, as well as atherosclerosis of the great
vessels of the mediastinum and the coronary arteries, including
calcified atherosclerotic plaque in the left main and left anterior
descending coronary arteries.

Mediastinum/Nodes: No pathologically enlarged mediastinal or hilar
lymph nodes. Please note that accurate exclusion of hilar adenopathy
is limited on noncontrast CT scans. Esophagus is unremarkable in
appearance. No axillary lymphadenopathy.

Lungs/Pleura: Multiple small pulmonary nodules are noted throughout
both lungs, largest of which is in the posterior aspect of the left
upper lobe abutting the major fissure (axial image 161 of series 3),
with a volume derived mean diameter 4.6 mm. No larger more
suspicious appearing pulmonary nodules or masses are noted. No acute
consolidative airspace disease. No pleural effusions. Mild diffuse
bronchial wall thickening with mild centrilobular and paraseptal
emphysema.

Upper Abdomen: Aortic atherosclerosis.

Musculoskeletal: Orthopedic fixation hardware in the spine at the
cervicothoracic junction and in the lower lumbar spine incompletely
imaged. There are no aggressive appearing lytic or blastic lesions
noted in the visualized portions of the skeleton.
IMPRESSION: 1. Lung-RADS 2S, benign appearance or behavior. Continue annual
screening with low-dose chest CT without contrast in 12 months.
2. The "S" modifier above refers to potentially clinically
significant non lung cancer related findings. Specifically, there is
aortic atherosclerosis, in addition to left main and 2 vessel
coronary artery disease. Please note that although the presence of
coronary artery calcium documents the presence of coronary artery
disease, the severity of this disease and any potential stenosis
cannot be assessed on this non-gated CT examination. Assessment for
potential risk factor modification, dietary therapy or pharmacologic
therapy may be warranted, if clinically indicated.
3. Mild diffuse bronchial wall thickening with mild centrilobular
and paraseptal emphysema; imaging findings suggestive of underlying
COPD.

Aortic Atherosclerosis (5V3HH-07D.D) and Emphysema (5V3HH-OQR.C).

## 2019-12-18 ENCOUNTER — Inpatient Hospital Stay: Admission: RE | Admit: 2019-12-18 | Payer: BC Managed Care – PPO | Source: Ambulatory Visit

## 2019-12-19 ENCOUNTER — Ambulatory Visit (INDEPENDENT_AMBULATORY_CARE_PROVIDER_SITE_OTHER)
Admission: RE | Admit: 2019-12-19 | Discharge: 2019-12-19 | Disposition: A | Discharge status: Home / Self Care | Payer: Medicare Other | Source: Ambulatory Visit | Attending: Acute Care | Admitting: Acute Care

## 2019-12-19 ENCOUNTER — Other Ambulatory Visit: Payer: Self-pay

## 2019-12-19 DIAGNOSIS — Z122 Encounter for screening for malignant neoplasm of respiratory organs: Secondary | ICD-10-CM

## 2019-12-19 DIAGNOSIS — Z87891 Personal history of nicotine dependence: Secondary | ICD-10-CM | POA: Diagnosis not present

## 2019-12-21 NOTE — Progress Notes (Signed)

## 2019-12-27 ENCOUNTER — Other Ambulatory Visit: Payer: Self-pay | Admitting: *Deleted

## 2019-12-27 DIAGNOSIS — Z87891 Personal history of nicotine dependence: Secondary | ICD-10-CM

## 2019-12-27 DIAGNOSIS — F17210 Nicotine dependence, cigarettes, uncomplicated: Secondary | ICD-10-CM

## 2020-02-13 ENCOUNTER — Other Ambulatory Visit: Payer: Self-pay

## 2020-02-13 ENCOUNTER — Ambulatory Visit: Payer: Medicare Other | Admitting: Cardiovascular Disease

## 2020-02-13 ENCOUNTER — Encounter: Payer: Self-pay | Admitting: Cardiovascular Disease

## 2020-02-13 VITALS — BP 124/72 | HR 79 | Ht 67.0 in | Wt 100.2 lb

## 2020-02-13 DIAGNOSIS — I2510 Atherosclerotic heart disease of native coronary artery without angina pectoris: Secondary | ICD-10-CM | POA: Diagnosis not present

## 2020-02-13 DIAGNOSIS — I471 Supraventricular tachycardia, unspecified: Secondary | ICD-10-CM

## 2020-02-13 DIAGNOSIS — J432 Centrilobular emphysema: Secondary | ICD-10-CM | POA: Diagnosis not present

## 2020-02-13 DIAGNOSIS — Z87891 Personal history of nicotine dependence: Secondary | ICD-10-CM

## 2020-02-13 DIAGNOSIS — I700 Atherosclerosis of aorta: Secondary | ICD-10-CM

## 2020-02-13 NOTE — Progress Notes (Signed)
Patient ID: [name], female   DOB: [birth year], 69 y.o.   MRN: [id number]    PCP: Dr. Gilford Rile  HPI: [name] is a 69 y.o. female resents to the office today for a 12 month followup cardiology evaluation.   Ms. Miedema has a history of supraventricular tachycardia which has been fairly well-controlled on combination verapamil SR  plus beta blocker therapy with atenolol.  She has a history of underlying mitral valve prolapse, mild carotid disease, hyperlipidemia, as well as mild COPD. She had smoked for over 30 years but quit smoking.  She was hospitalized after being bitten by a copperhead snake and required 5 days in the hospital. Apparently her cardiac medicines were held at that time and during the hospitalization she had recurrent episodes of tachycardia dysrhythmia. As long as she takes her medicines, and she feels that her heart rhythm is controlled. There is rare intermittent palpitations.typically, these episodes may last for under a minute and typically resolve with the Valsalva maneuver if they don't resolve spontaneously.  When I saw her in 2017 she was unaware of any breakthrough arrhythmias.  At that time her blood pressure was low and there was mild orthostatic drop.  Her resting pulse was 60.  I recommended that she continue to take Verapamil SR 240 mg but I reduced her atenolol dose to 12.5  mg twice a day.  She underwent a follow-up echo Doppler study on 10/25/2015.  This showed an EF of 60-65%.  There was normal wall motion.  There was no significant valvular pathology.  She was given preoperative clearance to undergo hip surgery by Dr. Fredonia Highland which was done in July 2017 and she tolerated this well.  I last saw her, she had undergone  a panoramic dental x-ray.  Incidentally, she was noted to have calcified carotid atheromas of the internal carotid artery suggestive of atherosclerotic disease.  I scheduled her for carotid duplex imaging which showed heterogeneous plaque  bilaterally with narrowing in the 1-39% range.  She had normal subclavian arteries, and patent vertebral arteries with antegrade flow.  She resumed smoking after being off cigarettes for 2 years.  She resumed smoking after a death of a family member.    I last saw her in May 2019 after not having seen her in over a year., she denies any episodes of chest pain or awareness of palpitations.  She was concerned about her weight loss.  She had resumed smoking but quit 1 month previous to her last evaluation.  Her smoking duration has been over 40 years.  She admits to exertional dyspnea.  She denies any prolonged cough.  In July 2018, LDL cholesterol was 78 on atorvastatin and only 5 mg.  She has continued to be on atenolol 12.5 mg twice a day and verapamil 240 mg daily.  She is on aspirin.  In December 2018 carotid duplex imaging showed mild bilateral carotid plaque which was not hemodynamically significant.  Due to weight loss I referred her to [name], NP of pulmonary so that she would qualify for a low-dose CT of her chest to evaluate for possible lung CA screening protocol.  This apparently did not show any cancer but there was aortic atherosclerosis in addition to left main and two-vessel CAD noted on the CT scan.  She had mild diffuse bronchial wall thickening with mild centrilobular and paraseptal emphysema, with imaging findings suggestive of underlying COPD.  When I saw her in January 27, 2019 she previously  had an episode  of food poisoning associated with dehydration and small bowel obstruction leading to a hospitalization.  She admits to weight loss.  She denied any anginal type symptoms.  She was continuing to smoke cigarettes.  She is now set up to undergo yearly lung radiation CT of her chest for lung CA screening..  Since I last saw her, she has continued to feel well.  She is still smoking and her smoking has been reduced to 5 cigarettes/day.  She had undergone laboratory by Dr. [name] in  April 2021 which showed a total cholesterol of 129, LDL 74 HDL 20 triglycerides 68.  She walks 2 miles per day typically drinks 5 to 7 days/week.  Back in December 19, 2019 she underwent a yearly follow-up CT of her chest was cancer screening.  This essentially was unchanged and showed benign appearance.  She again was noted to have her centrilobular emphysema.  There was aortic atherosclerosis as well as coronary artery atherosclerosis.  She presents for yearly evaluation.   Past Medical History:  Diagnosis Date  . Anxiety   . Back pain, chronic   . Bowel obstruction ([place])   . Complication of anesthesia    PT HAS POST OP URINARY RETENTION:  per patient "difficult stick- needs central line or picc"  04-06-2017 AT [facility] IV STARTED BY CRNA NO ISSUE  . COPD with emphysema ([place])   . DDD (degenerative disc disease), cervical    POST MULTIPLE SURGERY'S  . DDD (degenerative disc disease), lumbosacral    POST MULTIPLE SURGERY'S  . Difficult intravenous access   . Fecal incontinence    once a week----  TREATED W/ SACRAL NERVE STIMULATOR PLACED 12/ 2018  . Heart murmur   . History of chronic bronchitis   . History of spinal cord injury 02/09/2007   PT FELL-- S/P  CERVICAL SPINE SURGERY  . History of urinary retention    PT HAS POST OP URINARY RETENTION DUE TO ANESTHESIA  . History of venomous snake bite 2013   RIGHT MIDDLE FINGER BY COOPERHEAD--- TREATMENT W/ ANTIVENOM  . Hyperlipemia   . IBS (irritable bowel syndrome)    flairs up occasionally esp if stressed  . Incomplete right bundle branch block   . Major depression   . Mild carotid artery disease [facility]) cardiologist-  dr Claiborne Billings   mild bilateral ICA per duplex 04-12-2017  1-39%  . MVP (mitral valve prolapse)    no evidence stenosis or regurgitation per last echo 10-25-2015  . OA (osteoarthritis)   . PONV (postoperative nausea and vomiting)   . Pre-diabetes   . PSVT (paroxysmal supraventricular tachycardia) [facility])    cardiologist-- dr Claiborne Billings     Past Surgical History:  Procedure Laterality Date  . ABDOMINAL RECTOPEXY  2011       dr fuller  [facility])   prolapse rectum  . ANAL RECTAL MANOMETRY N/A 03/01/2017   Procedure: ANO RECTAL MANOMETRY;  Surgeon: Leighton Ruff, MD;  Location: [facility];  Service: Endoscopy;  Laterality: N/A;  . ANTERIOR CERVICAL DECOMP/DISCECTOMY FUSION  05-28-2000    dr Louanne Skye   C3-4  &  C7-T1  . ANTERIOR CERVICAL DECOMPRESSION LAMINECTOMY AND FUSION  02-09-2007    dr Arnoldo Morale   decompression/ laminectomy C3-4 & C5-6;  laminectomy C2; fusion C2-5  . BREAST SURGERY  1976   breast reduction  . COLONOSCOPY    . FORAMINOTOMY 1 LEVEL  11-03-1999   dr Louanne Skye  [facility]   left C7-T1 w/ decompression left C8  .  KNEE SURGERY  AGE 39   bone spur    . LUMBAR LAMINECTOMY/ DECOMPRESSION WITH MET-RX  02-09-2005  dr Flavia Shipper   L2-3 laminectomy left cage; fusion L2-4  . POSTERIOR CERVICAL FUSION/FORAMINOTOMY  12-08-2000    dr Louanne Skye   right forminotomy C7-T1 and posterior fusion  . RIGHT THUMB COLLATERAL LIGAMENT RECONSTRUCTION  02-08-2003   dr Amedeo Plenty  . RIGHT THUMB LIGAMENT REPAIR, ARTHROTOMY, SYNOVECTOMY  10-26-2002    dr Amedeo Plenty  . [place] to 01/2007   total 14 surgery's  cervical and lumbar  (including fusion's, diskectomy, laminectomy's, foraminotomy's)  . TONSILLECTOMY    . TOTAL HIP ARTHROPLASTY Right 07/31/2014   Procedure: TOTAL HIP ARTHROPLASTY ANTERIOR APPROACH;  Surgeon: Renette Butters, MD;  Location: [place];  Service: Orthopedics;  Laterality: Right;  . TOTAL HIP ARTHROPLASTY Left 11/19/2015   Procedure: TOTAL HIP ARTHROPLASTY ANTERIOR APPROACH;  Surgeon: Renette Butters, MD;  Location: [place];  Service: Orthopedics;  Laterality: Left;  . TRANSTHORACIC ECHOCARDIOGRAM  10-25-2015   dr t. Claiborne Billings   ef 60-655/  no evidence MV stenosis or regurgitation/    . TUBAL LIGATION      Allergies  Allergen Reactions  . Bupropion Hcl Swelling  . Celecoxib Swelling  . Naproxen Swelling  . Methadone  Nausea And Vomiting  . Morphine Itching    Current Outpatient Medications  Medication Sig Dispense Refill  . aspirin EC 81 MG tablet Take 81 mg by mouth at bedtime.    Marland Kitchen atenolol (TENORMIN) 25 MG tablet Take 0.5 tablets (12.5 mg total) by mouth 2 (two) times daily. 90 tablet 3  . atorvastatin (LIPITOR) 10 MG tablet Take 1 tablet (10 mg total) by mouth daily at 6 PM. 90 tablet 2  . baclofen (LIORESAL) 20 MG tablet Take 20 mg by mouth 3 (three) times daily as needed for muscle spasms.     . Calcium Carbonate-Vitamin D (CALCIUM + D PO) Take 1 tablet by mouth 2 (two) times daily. Calcium Magnesium Zinc and Vitamin D    . clonazePAM (KLONOPIN) 1 MG tablet Take 1 mg by mouth at bedtime.    . fish oil-omega-3 fatty acids 1000 MG capsule Take 1 g by mouth at bedtime.     . Multiple Vitamin (MULTIVITAMIN WITH MINERALS) TABS tablet Take 1 tablet by mouth daily.    Marland Kitchen omeprazole (PRILOSEC) 20 MG capsule Take 20 mg by mouth daily.    . verapamil (CALAN-SR) 240 MG CR tablet Take 1 tablet (240 mg total) by mouth at bedtime. 90 tablet 3   No current facility-administered medications for this visit.    Social History   Socioeconomic History  . Marital status: Married    Spouse name: CARROLL  . Number of children: 0  . Years of education: 16  . Highest education level: Bachelor's degree (e.g., BA, AB, BS)  Occupational History  . Occupation: Nursing-RN    RETIRED    Employer: [employer]  Tobacco Use  . Smoking status: Former Smoker    Packs/day: 0.75    Years: 42.00    Pack years: 31.50    Types: Cigarettes    Quit date: 08/08/2017    Years since quitting: 2.5  . Smokeless tobacco: Never Used  Vaping Use  . Vaping Use: Never used  Substance and Sexual Activity  . Alcohol use: Not Currently    Alcohol/week: 5.0 - 6.0 standard drinks    Types: 5 - 6 Glasses of wine per week  Comment: no longer drinks-stopped approx 6 yrs ago  . Drug use: No  . Sexual activity: Yes    Birth  control/protection: None  Other Topics Concern  . Not on file  Social History Narrative  . Not on file   Social Determinants of Health   Financial Resource Strain:   . Difficulty of Paying Living Expenses: Not on file  Food Insecurity:   . Worried About Charity fundraiser in the Last Year: Not on file  . Ran Out of Food in the Last Year: Not on file  Transportation Needs:   . Lack of Transportation (Medical): Not on file  . Lack of Transportation (Non-Medical): Not on file  Physical Activity:   . Days of Exercise per Week: Not on file  . Minutes of Exercise per Session: Not on file  Stress:   . Feeling of Stress : Not on file  Social Connections:   . Frequency of Communication with Friends and Family: Not on file  . Frequency of Social Gatherings with Friends and Family: Not on file  . Attends Religious Services: Not on file  . Active Member of Clubs or Organizations: Not on file  . Attends Archivist Meetings: Not on file  . Marital Status: Not on file  Intimate Partner Violence:   . Fear of Current or Ex-Partner: Not on file  . Emotionally Abused: Not on file  . Physically Abused: Not on file  . Sexually Abused: Not on file   Social history is notable in that she is married. She has one stepchild and 2 stepgrandchildren. She does walk. She has resumed tobacco.  Family History  Problem Relation Age of Onset  . COPD Father   . Diabetes Mother   . Heart disease Mother   . Kidney disease Mother   . Hypertension Mother     ROS General: Negative; No fevers, chills, or night sweats;  HEENT: Negative; No changes in vision or hearing, sinus congestion, difficulty swallowing Pulmonary: positive for mild COPD; No cough, wheezing, shortness of breath, hemoptysis Cardiovascular: Negative; No chest pain, presyncope, syncope, palpitations GI: Positive for irritable bowel syndrome GU: Negative; No dysuria, hematuria, or difficulty voiding Musculoskeletal: Status post  left hip replacement surgery. Hematologic/Oncology: Negative; no easy bruising, bleeding Endocrine: Negative; no heat/cold intolerance; no diabetes Neuro: Negative; no changes in balance, headaches Skin: Negative; No rashes or skin lesions Psychiatric: positive for mild depression for which she takes Zoloft.; No behavioral problems,  Sleep: Negative; No snoring, daytime sleepiness, hypersomnolence, bruxism, restless legs, hypnogognic hallucinations, no cataplexy Other comprehensive 14 point system review is negative.   PE BP 124/72   Pulse 79   Ht $R'5\' 7"'aU$  (1.702 m)   Wt 100 lb 3.2 oz (45.5 kg)   SpO2 98%   BMI 15.69 kg/m    Repeat blood pressure by me was 125/75  Wt Readings from Last 3 Encounters:  02/13/20 100 lb 3.2 oz (45.5 kg)  02/02/19 101 lb (45.8 kg)  01/09/19 96 lb 5.5 oz (43.7 kg)   General: Alert, oriented, no distress. Skin: normal turgor, no rashes, warm and dry HEENT: Normocephalic, atraumatic. Pupils equal round and reactive to light; sclera anicteric; extraocular muscles intact; Nose without nasal septal hypertrophy Mouth/Parynx benign; Mallinpatti scale 2 Neck: No JVD, no carotid bruits; normal carotid upstroke Lungs: clear to ausculatation and percussion; no wheezing or rales Chest wall: without tenderness to palpitation Heart: PMI not displaced, RRR, s1 s2 normal, 1/6 systolic murmur, no diastolic murmur, no  rubs, gallops, thrills, or heaves Abdomen: soft, nontender; no hepatosplenomehaly, BS+; abdominal aorta nontender and not dilated by palpation. Back: no CVA tenderness Pulses 2+ Musculoskeletal: full range of motion, normal strength, no joint deformities Extremities: no clubbing cyanosis or edema, Homan's sign negative  Neurologic: grossly nonfocal; Cranial nerves grossly wnl Psychologic: Normal mood and affect  ECG (independently read by me): NSR at 79; QS V1-3; no ectopy  February 02, 2020 ECG (independently read by me): NSR at 71; Probable bistrial  enlargement QS V1-26 Aug 2017 ECG (independently read by me): Sinus bradycardia 55 bpm.  QS complex V1 V2.  Normal intervals.  No ectopy.  May 2018 ECG (independently read by me): Sinus bradycardia 56 bpm.  QS V1, V2.  Mild RV conduction delay.  Normal intervals.  No ST segment changes.  November 2017 ECG (independently read by me): Sinus bradycardia 52 bpm.  QRS complex V1 V2.  Normal intervals.  June 2017 ECG (independently read by me): Normal sinus rhythm at 60 bpm.  First-degree AV block with a PR interval of 220 ms.  No significant ST changes.  Incomplete right bundle branch block.   November 2016 ECG (independently read by me): normal sinus rhythm with first-degree heart block with a PR interval at 270 ms.  QRS complex V1 V2.  November 2015 ECG (independently read by me.  (: Normal sinus rhythm at 64 bpm.  Borderline first degree AV block with a PR interval at 204 ms.  Mild RV conduction delay.  Anteroseptal Q waves, unchanged  October 2014ECG: Sinus rhythm with incomplete right bundle branch block. Anteroseptal Q waves unchanged.  LABS:  BMP Latest Ref Rng & Units 01/12/2019 01/10/2019 01/08/2019  Glucose 70 - 99 mg/dL 78 69(L) 88  BUN 8 - 23 mg/dL 13 <5(L) 20  Creatinine 0.44 - 1.00 mg/dL 0.75 0.64 0.77  BUN/Creat Ratio 12 - 28 - - -  Sodium 135 - 145 mmol/L 141 144 138  Potassium 3.5 - 5.1 mmol/L 3.9 3.2(L) 3.9  Chloride 98 - 111 mmol/L 105 108 105  CO2 22 - 32 mmol/L 26 26 21(L)  Calcium 8.9 - 10.3 mg/dL 9.4 8.3(L) 8.6(L)   Hepatic Function Latest Ref Rng & Units 01/12/2019 01/07/2019 01/03/2019  Total Protein 6.5 - 8.1 g/dL 6.1(L) 5.4(L) 7.0  Albumin 3.5 - 5.0 g/dL 3.7 3.1(L) 4.0  AST 15 - 41 U/L 75(H) 40 28  ALT 0 - 44 U/L 76(H) 42 25  Alk Phosphatase 38 - 126 U/L 77 68 86  Total Bilirubin 0.3 - 1.2 mg/dL 0.9 1.2 1.4(H)  Bilirubin, Direct 0.0 - 0.3 mg/dL - - -   CBC Latest Ref Rng & Units 01/12/2019 01/08/2019 01/07/2019  WBC 4.0 - 10.5 K/uL 6.4 10.8(H) 12.4(H)  Hemoglobin  12.0 - 15.0 g/dL 16.4(H) 15.4(H) 14.8  Hematocrit 36 - 46 % 48.2(H) 47.0(H) 44.9  Platelets 150 - 400 K/uL 222 150 176   Lab Results  Component Value Date   MCV 101.5 (H) 01/12/2019   MCV 101.5 (H) 01/08/2019   MCV 101.6 (H) 01/07/2019   Lab Results  Component Value Date   TSH 1.010 11/03/2016   Lipid Panel     Component Value Date/Time   CHOL 146 11/03/2016 0920   TRIG 111 11/03/2016 0920   HDL 46 11/03/2016 0920   CHOLHDL 3.2 11/03/2016 0920   CHOLHDL 3.3 02/28/2015 1023   VLDL 30 02/28/2015 1023   LDLCALC 78 11/03/2016 0920     RADIOLOGY: No results found.  IMPRESSION:  1. SVT (supraventricular tachycardia) ([place])   2. Coronary artery calcification seen on CAT scan   3. Aortic atherosclerosis ([place])   4. Centrilobular emphysema ([place])   5. History of tobacco abuse     ASSESSMENT AND PLAN: Ms. Mastrangelo is a 69 year old female who has a history of SVT which had been fairly well-controlled on verapamil 240 mg as well as atenolol 12.5 mg twice a day.  She is unaware of any breakthrough SVT episodes.  She has a greater than 40-year history of tobacco use and in the past has had intermittently quit for short duration.  Presently, she denies any anginal symptoms.  In August 2020 she underwent myocardial perfusion study which showed normal perfusion without ischemia or infarction, EF of 66%.  This study was done after she had a CT scanning which demonstrated coronary calcification with left main and two-vessel CAD noted on CT imaging.  CT showed mild centrilobular and paraseptal emphysema and underlying COPD.  I reviewed her most recent CT which was essentially  unchanged.  She continues to beon very low-dose statin therapy with 10 mg.  LDL cholesterol in April 2021 had improved to 73.  She will be seeing Dr. Bea Graff later this week and repeat laboratory will be performed.  If LDL cholesterol has further increased I would recommend titration of atorvastatin to 20 mg.  She continues to  be on atenolol 12.5 mg and verapamil 2040 mg and remotely her blood pressure had become low on increased atenolol dose.  I again discussed the importance of absolute cigarette smoking cessation.  She is trying hard and hopefully will be successful.  I commended her on her daily exercise and she is walking 2 miles per day at least 5 to 7 days/week.  She is unaware of any wheezing.  Presently I will not make any medication changes.  I will see her in 1 year for cardiology reevaluation or sooner as needed.  Troy Sine, MD, [facility]  02/13/2020 2:49 PM

## 2020-02-13 NOTE — Patient Instructions (Signed)
Medication Instructions:  No changes *If you need a refill on your cardiac medications before your next appointment, please call your pharmacy*   Lab Work: None ordered If you have labs (blood work) drawn today and your tests are completely normal, you will receive your results only by: Marland Kitchen MyChart Message (if you have MyChart) OR . A paper copy in the mail If you have any lab test that is abnormal or we need to change your treatment, we will call you to review the results.   Testing/Procedures: None ordered   Follow-Up: At [facility], you and your health needs are our priority.  As part of our continuing mission to provide you with exceptional heart care, we have created designated Provider Care Teams.  These Care Teams include your primary Cardiologist (physician) and Advanced Practice Providers (APPs -  Physician Assistants and Nurse Practitioners) who all work together to provide you with the care you need, when you need it.  We recommend signing up for the patient portal called "MyChart".  Sign up information is provided on this After Visit Summary.  MyChart is used to connect with patients for Virtual Visits (Telemedicine).  Patients are able to view lab/test results, encounter notes, upcoming appointments, etc.  Non-urgent messages can be sent to your provider as well.   To learn more about what you can do with MyChart, go to [email or web address].    Your next appointment:   12 month(s)  The format for your next appointment:   In Person  Provider:   Shelva Majestic, MD   Other Instructions None

## 2020-07-19 ENCOUNTER — Other Ambulatory Visit: Payer: Self-pay | Admitting: Cardiovascular Disease

## 2020-09-19 ENCOUNTER — Encounter: Payer: Self-pay | Admitting: Cardiovascular Disease

## 2020-09-19 ENCOUNTER — Other Ambulatory Visit: Payer: Self-pay

## 2020-09-19 ENCOUNTER — Ambulatory Visit: Payer: Medicare Other | Admitting: Cardiovascular Disease

## 2020-09-19 VITALS — BP 116/64 | HR 61 | Wt 104.8 lb

## 2020-09-19 DIAGNOSIS — R0600 Dyspnea, unspecified: Secondary | ICD-10-CM | POA: Diagnosis not present

## 2020-09-19 DIAGNOSIS — J432 Centrilobular emphysema: Secondary | ICD-10-CM

## 2020-09-19 DIAGNOSIS — I2510 Atherosclerotic heart disease of native coronary artery without angina pectoris: Secondary | ICD-10-CM | POA: Diagnosis not present

## 2020-09-19 DIAGNOSIS — M7989 Other specified soft tissue disorders: Secondary | ICD-10-CM

## 2020-09-19 DIAGNOSIS — I471 Supraventricular tachycardia, unspecified: Secondary | ICD-10-CM

## 2020-09-19 DIAGNOSIS — Z79899 Other long term (current) drug therapy: Secondary | ICD-10-CM

## 2020-09-19 DIAGNOSIS — I700 Atherosclerosis of aorta: Secondary | ICD-10-CM

## 2020-09-19 DIAGNOSIS — R0609 Other forms of dyspnea: Secondary | ICD-10-CM

## 2020-09-19 DIAGNOSIS — Z87891 Personal history of nicotine dependence: Secondary | ICD-10-CM

## 2020-09-19 DIAGNOSIS — E785 Hyperlipidemia, unspecified: Secondary | ICD-10-CM

## 2020-09-19 MED ORDER — HYDROCHLOROTHIAZIDE 12.5 MG PO CAPS: 12.5000 mg | ORAL_CAPSULE | Freq: Every day | ORAL | 3 refills | Status: DC

## 2020-09-19 MED ORDER — ATORVASTATIN CALCIUM 40 MG PO TABS: 40.0000 mg | ORAL_TABLET | Freq: Every day | ORAL | 3 refills | Status: DC

## 2020-09-19 NOTE — Progress Notes (Signed)
Patient ID: [name], female   DOB: [birth year], 70 y.o.   MRN: [id number]    PCP: Dr. Gilford Rile  HPI: [name] is a 70 y.o. female resents to the office today for a 6 month followup cardiology evaluation.   Ms. Madl has a history of supraventricular tachycardia which has been fairly well-controlled on combination verapamil SR  plus beta blocker therapy with atenolol.  She has a history of underlying mitral valve prolapse, mild carotid disease, hyperlipidemia, as well as mild COPD. She had smoked for over 30 years but quit smoking.  She was hospitalized after being bitten by a copperhead snake and required 5 days in the hospital. Apparently her cardiac medicines were held at that time and during the hospitalization she had recurrent episodes of tachycardia dysrhythmia. As long as she takes her medicines, and she feels that her heart rhythm is controlled. There is rare intermittent palpitations.typically, these episodes may last for under a minute and typically resolve with the Valsalva maneuver if they don't resolve spontaneously.  When I saw her in 2017 she was unaware of any breakthrough arrhythmias.  At that time her blood pressure was low and there was mild orthostatic drop.  Her resting pulse was 60.  I recommended that she continue to take Verapamil SR 240 mg but I reduced her atenolol dose to 12.5  mg twice a day.  She underwent a follow-up echo Doppler study on 10/25/2015.  This showed an EF of 60-65%.  There was normal wall motion.  There was no significant valvular pathology.  She was given preoperative clearance to undergo hip surgery by Dr. Fredonia Highland which was done in July 2017 and she tolerated this well.  I last saw her, she had undergone  a panoramic dental x-ray.  Incidentally, she was noted to have calcified carotid atheromas of the internal carotid artery suggestive of atherosclerotic disease.  I scheduled her for carotid duplex imaging which showed heterogeneous plaque  bilaterally with narrowing in the 1-39% range.  She had normal subclavian arteries, and patent vertebral arteries with antegrade flow.  She resumed smoking after being off cigarettes for 2 years.  She resumed smoking after a death of a family member.    I  saw her in May 2019 after not having seen her in over a year., she denies any episodes of chest pain or awareness of palpitations.  She was concerned about her weight loss.  She had resumed smoking but quit 1 month previous to her last evaluation.  Her smoking duration has been over 40 years.  She admits to exertional dyspnea.  She denies any prolonged cough.  In July 2018, LDL cholesterol was 78 on atorvastatin and only 5 mg.  She has continued to be on atenolol 12.5 mg twice a day and verapamil 240 mg daily.  She is on aspirin.  In December 2018 carotid duplex imaging showed mild bilateral carotid plaque which was not hemodynamically significant.  Due to weight loss I referred her to [name], NP of pulmonary so that she would qualify for a low-dose CT of her chest to evaluate for possible lung CA screening protocol.  This apparently did not show any cancer but there was aortic atherosclerosis in addition to left main and two-vessel CAD noted on the CT scan.  She had mild diffuse bronchial wall thickening with mild centrilobular and paraseptal emphysema, with imaging findings suggestive of underlying COPD.  When I saw her in January 27, 2019 she previously  had an episode  of food poisoning associated with dehydration and small bowel obstruction leading to a hospitalization.  She admits to weight loss.  She denied any anginal type symptoms.  She was continuing to smoke cigarettes.  She is now set up to undergo yearly lung radiation CT of her chest for lung CA screening.Marland Kitchen  She was last evaluated by me in October 2021.  Since her prior evaluation she continued to feel well.  She was still smoking  smoking and her smoking has been reduced to 5  cigarettes/day.  She had undergone laboratory by Dr. [name] in April 2021 which showed a total cholesterol of 129, LDL 74 HDL 20 triglycerides 68.  She walks 2 miles per day typically drinks 5 to 7 days/week.  Back in December 19, 2019 she underwent a yearly follow-up CT of her chest was cancer screening.  This essentially was unchanged and showed benign appearance.  She again was noted to have her centrilobular emphysema.  There was aortic atherosclerosis as well as coronary artery atherosclerosis.    Since I last saw her, she has remained stable without chest pain.  However, she does admit to shortness of breath with activity.  She has documented centrilobular emphysema.  She has been trying to walk up to 2 miles per day.  Over the past 3 weeks she has noticed development of lower extremity bilateral edema left greater than right which is pitting.  She is still smoking approximately 4 cigarettes/day and over the past 4 days initiated a nitroglycerin patch.  She had undergone laboratory by her primary physician.  Several months ago her atorvastatin was increased to 20 mg.  LDL on her increased atorvastatin on August 15, 2020 was 88 with total cholesterol 131, HDL 36 and triglycerides 79.  Chemistry was normal.  She presents for evaluation.  Past Medical History:  Diagnosis Date  . Anxiety   . Back pain, chronic   . Bowel obstruction ([place])   . Complication of anesthesia    PT HAS POST OP URINARY RETENTION:  per patient "difficult stick- needs central line or picc"  04-06-2017 AT [facility] IV STARTED BY CRNA NO ISSUE  . COPD with emphysema ([place])   . DDD (degenerative disc disease), cervical    POST MULTIPLE SURGERY'S  . DDD (degenerative disc disease), lumbosacral    POST MULTIPLE SURGERY'S  . Difficult intravenous access   . Fecal incontinence    once a week----  TREATED W/ SACRAL NERVE STIMULATOR PLACED 12/ 2018  . Heart murmur   . History of chronic bronchitis   . History of spinal cord injury  02/09/2007   PT FELL-- S/P  CERVICAL SPINE SURGERY  . History of urinary retention    PT HAS POST OP URINARY RETENTION DUE TO ANESTHESIA  . History of venomous snake bite 2013   RIGHT MIDDLE FINGER BY COOPERHEAD--- TREATMENT W/ ANTIVENOM  . Hyperlipemia   . IBS (irritable bowel syndrome)    flairs up occasionally esp if stressed  . Incomplete right bundle branch block   . Major depression   . Mild carotid artery disease [facility]) cardiologist-  dr Claiborne Billings   mild bilateral ICA per duplex 04-12-2017  1-39%  . MVP (mitral valve prolapse)    no evidence stenosis or regurgitation per last echo 10-25-2015  . OA (osteoarthritis)   . PONV (postoperative nausea and vomiting)   . Pre-diabetes   . PSVT (paroxysmal supraventricular tachycardia) [facility])    cardiologist-- dr Claiborne Billings    Past  Surgical History:  Procedure Laterality Date  . ABDOMINAL RECTOPEXY  2011       dr fuller  [facility])   prolapse rectum  . ANAL RECTAL MANOMETRY N/A 03/01/2017   Procedure: ANO RECTAL MANOMETRY;  Surgeon: Leighton Ruff, MD;  Location: [facility] END[facility]COPY;  Service: Endoscopy;  Laterality: N/A;  . ANTERIOR CERVICAL DECOMP/DISCECTOMY FUSION  05-28-2000    dr Louanne Skye   C3-4  &  C7-T1  . ANTERIOR CERVICAL DECOMPRESSION LAMINECTOMY AND FUSION  02-09-2007    dr Arnoldo Morale   decompression/ laminectomy C3-4 & C5-6;  laminectomy C2; fusion C2-5  . BREAST SURGERY  1976   breast reduction  . COLONOSCOPY    . FORAMINOTOMY 1 LEVEL  11-03-1999   dr Louanne Skye  [facility]   left C7-T1 w/ decompression left C8  . KNEE SURGERY  AGE 57   bone spur    . LUMBAR LAMINECTOMY/ DECOMPRESSION WITH MET-RX  02-09-2005  dr Flavia Shipper   L2-3 laminectomy left cage; fusion L2-4  . POSTERIOR CERVICAL FUSION/FORAMINOTOMY  12-08-2000    dr Louanne Skye   right forminotomy C7-T1 and posterior fusion  . RIGHT THUMB COLLATERAL LIGAMENT RECONSTRUCTION  02-08-2003   dr Amedeo Plenty  . RIGHT THUMB LIGAMENT REPAIR, ARTHROTOMY, SYNOVECTOMY  10-26-2002    dr Amedeo Plenty  . [place] to 01/2007   total 14 surgery's  cervical and lumbar  (including fusion's, diskectomy, laminectomy's, foraminotomy's)  . TONSILLECTOMY    . TOTAL HIP ARTHROPLASTY Right 07/31/2014   Procedure: TOTAL HIP ARTHROPLASTY ANTERIOR APPROACH;  Surgeon: Renette Butters, MD;  Location: [place];  Service: Orthopedics;  Laterality: Right;  . TOTAL HIP ARTHROPLASTY Left 11/19/2015   Procedure: TOTAL HIP ARTHROPLASTY ANTERIOR APPROACH;  Surgeon: Renette Butters, MD;  Location: [place];  Service: Orthopedics;  Laterality: Left;  . TRANSTHORACIC ECHOCARDIOGRAM  10-25-2015   dr t. Claiborne Billings   ef 60-655/  no evidence MV stenosis or regurgitation/    . TUBAL LIGATION      Allergies  Allergen Reactions  . Bupropion Hcl Swelling  . Celecoxib Swelling  . Naproxen Swelling  . Methadone Nausea And Vomiting  . Morphine Itching    Current Outpatient Medications  Medication Sig Dispense Refill  . aspirin EC 81 MG tablet Take 81 mg by mouth at bedtime.    Marland Kitchen atenolol (TENORMIN) 25 MG tablet TAKE 1/2 TABLET BY MOUTH 2 TIMES DAILY. 90 tablet 2  . atorvastatin (LIPITOR) 40 MG tablet Take 1 tablet (40 mg total) by mouth daily. 90 tablet 3  . baclofen (LIORESAL) 20 MG tablet Take 20 mg by mouth 3 (three) times daily as needed for muscle spasms.     . Calcium Carbonate-Vitamin D (CALCIUM + D PO) Take 1 tablet by mouth 2 (two) times daily. Calcium Magnesium Zinc and Vitamin D    . clonazePAM (KLONOPIN) 1 MG tablet Take 1 mg by mouth at bedtime.    . fish oil-omega-3 fatty acids 1000 MG capsule Take 1 g by mouth at bedtime.     . hydrochlorothiazide (MICROZIDE) 12.5 MG capsule Take 1 capsule (12.5 mg total) by mouth daily. 90 capsule 3  . Multiple Vitamin (MULTIVITAMIN WITH MINERALS) TABS tablet Take 1 tablet by mouth daily.    Marland Kitchen omeprazole (PRILOSEC) 20 MG capsule Take 20 mg by mouth daily.    . verapamil (CALAN-SR) 240 MG CR tablet TAKE 1 TABLET BY MOUTH AT BEDTIME. 90 tablet 2   No current facility-administered  medications for this visit.  Social History   Socioeconomic History  . Marital status: Married    Spouse name: CARROLL  . Number of children: 0  . Years of education: 16  . Highest education level: Bachelor's degree (e.g., BA, AB, BS)  Occupational History  . Occupation: Nursing-RN    RETIRED    Employer: [employer]  Tobacco Use  . Smoking status: Former Smoker    Packs/day: 0.75    Years: 42.00    Pack years: 31.50    Types: Cigarettes    Quit date: 08/08/2017    Years since quitting: 3.1  . Smokeless tobacco: Never Used  Vaping Use  . Vaping Use: Never used  Substance and Sexual Activity  . Alcohol use: Not Currently    Alcohol/week: 5.0 - 6.0 standard drinks    Types: 5 - 6 Glasses of wine per week    Comment: no longer drinks-stopped approx 6 yrs ago  . Drug use: No  . Sexual activity: Yes    Birth control/protection: None  Other Topics Concern  . Not on file  Social History Narrative  . Not on file   Social Determinants of Health   Financial Resource Strain: Not on file  Food Insecurity: Not on file  Transportation Needs: Not on file  Physical Activity: Not on file  Stress: Not on file  Social Connections: Not on file  Intimate Partner Violence: Not on file   Social history is notable in that she is married. She has one stepchild and 2 stepgrandchildren. She does walk. She has resumed tobacco.  Family History  Problem Relation Age of Onset  . COPD Father   . Diabetes Mother   . Heart disease Mother   . Kidney disease Mother   . Hypertension Mother     ROS General: Negative; No fevers, chills, or night sweats;  HEENT: Negative; No changes in vision or hearing, sinus congestion, difficulty swallowing Pulmonary: positive for mild COPD; No cough, wheezing, shortness of breath, hemoptysis Cardiovascular: See HPI GI: Positive for irritable bowel syndrome GU: Negative; No dysuria, hematuria, or difficulty voiding Musculoskeletal: Status post left hip  replacement surgery. Hematologic/Oncology: Negative; no easy bruising, bleeding Endocrine: Negative; no heat/cold intolerance; no diabetes Neuro: Negative; no changes in balance, headaches Skin: Negative; No rashes or skin lesions Psychiatric: positive for mild depression for which she takes Zoloft.; No behavioral problems,  Sleep: Negative; No snoring, daytime sleepiness, hypersomnolence, bruxism, restless legs, hypnogognic hallucinations, no cataplexy Other comprehensive 14 point system review is negative.   PE BP 116/64   Pulse 61   Wt 104 lb 12.8 oz (47.5 kg)   SpO2 100%   BMI 16.41 kg/m    Repeat blood pressure by me was 124/64  Wt Readings from Last 3 Encounters:  09/19/20 104 lb 12.8 oz (47.5 kg)  02/13/20 100 lb 3.2 oz (45.5 kg)  02/02/19 101 lb (45.8 kg)   General: Alert, oriented, no distress.  Skin: normal turgor, no rashes, warm and dry HEENT: Normocephalic, atraumatic. Pupils equal round and reactive to light; sclera anicteric; extraocular muscles intact;  Nose without nasal septal hypertrophy Mouth/Parynx benign; Mallinpatti scale 2 Neck: No JVD, no carotid bruits; normal carotid upstroke Lungs: clear to ausculatation and percussion; no wheezing or rales Chest wall: without tenderness to palpitation Heart: PMI not displaced, RRR, s1 s2 normal, 1/6 systolic murmur, no diastolic murmur, no rubs, gallops, thrills, or heaves Abdomen: soft, nontender; no hepatosplenomehaly, BS+; abdominal aorta nontender and not dilated by palpation. Back: no CVA tenderness Pulses  2+ Musculoskeletal: full range of motion, normal strength, no joint deformities Extremities: 1-2+ left and 1+ right ankle edema extending to the distal pretibial region ; mild discoloration;  no clubbing cyanosis or edema, Homan's sign negative  Neurologic: grossly nonfocal; Cranial nerves grossly wnl Psychologic: Normal mood and affect  ECG (independently read by me): NSR at 61; LAD      October 2021  ECG (independently read by me): NSR at 79; QS V1-3; no ectopy  February 02, 2020 ECG (independently read by me): NSR at 71; Probable bistrial enlargement QS V1-26 Aug 2017 ECG (independently read by me): Sinus bradycardia 55 bpm.  QS complex V1 V2.  Normal intervals.  No ectopy.  May 2018 ECG (independently read by me): Sinus bradycardia 56 bpm.  QS V1, V2.  Mild RV conduction delay.  Normal intervals.  No ST segment changes.  November 2017 ECG (independently read by me): Sinus bradycardia 52 bpm.  QRS complex V1 V2.  Normal intervals.  June 2017 ECG (independently read by me): Normal sinus rhythm at 60 bpm.  First-degree AV block with a PR interval of 220 ms.  No significant ST changes.  Incomplete right bundle branch block.   November 2016 ECG (independently read by me): normal sinus rhythm with first-degree heart block with a PR interval at 270 ms.  QRS complex V1 V2.  November 2015 ECG (independently read by me.  (: Normal sinus rhythm at 64 bpm.  Borderline first degree AV block with a PR interval at 204 ms.  Mild RV conduction delay.  Anteroseptal Q waves, unchanged  October 2014ECG: Sinus rhythm with incomplete right bundle branch block. Anteroseptal Q waves unchanged.  LABS:  BMP Latest Ref Rng & Units 01/12/2019 01/10/2019 01/08/2019  Glucose 70 - 99 mg/dL 78 69(L) 88  BUN 8 - 23 mg/dL 13 <5(L) 20  Creatinine 0.44 - 1.00 mg/dL 0.75 0.64 0.77  BUN/Creat Ratio 12 - 28 - - -  Sodium 135 - 145 mmol/L 141 144 138  Potassium 3.5 - 5.1 mmol/L 3.9 3.2(L) 3.9  Chloride 98 - 111 mmol/L 105 108 105  CO2 22 - 32 mmol/L 26 26 21(L)  Calcium 8.9 - 10.3 mg/dL 9.4 8.3(L) 8.6(L)   Hepatic Function Latest Ref Rng & Units 01/12/2019 01/07/2019 01/03/2019  Total Protein 6.5 - 8.1 g/dL 6.1(L) 5.4(L) 7.0  Albumin 3.5 - 5.0 g/dL 3.7 3.1(L) 4.0  AST 15 - 41 U/L 75(H) 40 28  ALT 0 - 44 U/L 76(H) 42 25  Alk Phosphatase 38 - 126 U/L 77 68 86  Total Bilirubin 0.3 - 1.2 mg/dL 0.9 1.2 1.4(H)  Bilirubin,  Direct 0.0 - 0.3 mg/dL - - -   CBC Latest Ref Rng & Units 01/12/2019 01/08/2019 01/07/2019  WBC 4.0 - 10.5 K/uL 6.4 10.8(H) 12.4(H)  Hemoglobin 12.0 - 15.0 g/dL 16.4(H) 15.4(H) 14.8  Hematocrit 36.0 - 46.0 % 48.2(H) 47.0(H) 44.9  Platelets 150 - 400 K/uL 222 150 176   Lab Results  Component Value Date   MCV 101.5 (H) 01/12/2019   MCV 101.5 (H) 01/08/2019   MCV 101.6 (H) 01/07/2019   Lab Results  Component Value Date   TSH 1.010 11/03/2016   Lipid Panel     Component Value Date/Time   CHOL 146 11/03/2016 0920   TRIG 111 11/03/2016 0920   HDL 46 11/03/2016 0920   CHOLHDL 3.2 11/03/2016 0920   CHOLHDL 3.3 02/28/2015 1023   VLDL 30 02/28/2015 1023   LDLCALC 78 11/03/2016 0920  RADIOLOGY: No results found.  IMPRESSION:  1. Coronary artery calcification seen on CAT scan   2. DOE (dyspnea on exertion)   3. Aortic atherosclerosis ([place])   4. Leg swelling   5. Hyperlipidemia with target LDL less than 70   6. SVT (supraventricular tachycardia) ([place])   7. History of tobacco abuse   8. Centrilobular emphysema ([place])     ASSESSMENT AND PLAN: Ms. Bowler is a 70year-old female who has a history of SVT which had been fairly well-controlled over many years on verapamil 240 mg as well as atenolol 12.5 mg twice a day.  She is unaware of any breakthrough SVT episodes.  She has a greater than 40-year history of tobacco use and in the past has had intermittently quit for short duration.  She has not had any anginal chest tightness symptoms.  In August 2020 she underwent myocardial perfusion study which showed normal perfusion without ischemia or infarction, EF of 66%.  This study was done after she had a CT scanning which demonstrated coronary calcification with left main and two-vessel CAD noted on CT imaging.  CT showed mild centrilobular and paraseptal emphysema and underlying COPD.  I reviewed her most recent CT which was essentially  unchanged.  When I last saw her, she was on low-dose  atorvastatin at 10 mg.  Her primary physician subsequently increase this to 20 mg and most recent follow-up laboratory on August 15, 2020 showed an LDL at 49 with total cholesterol 131.  With her documented coronary calcification as well as aortic atherosclerosis, I have recommended more aggressive lipid management with target LDL in the 50s and 60s if at all possible.  As result I am further increasing atorvastatin to 40 mg daily.  She has experienced leg edema.  I am initiating HCTZ 12.5 mg daily.  After 1 week if edema still persists she will increase this to 25 mg until edema resolves and then resume the 12.5 dose.  With her exertional dyspnea which most likely is contributed by her centrilobular emphysema 1 to make certain she is not developing significant ischemia and as result we will schedule her for follow-up Lexiscan Myoview study.  I again discussed absolute smoking cessation.  She has undergone 14 prior back surgeries and continues to have difficulty with back discomfort.  I will see her in the office in 2 to 3 months prior to that evaluation I have recommended follow-up chemistry and lipid studies on her increased atorvastatin at 40 mg. .  Troy Sine, MD, [facility]  09/19/2020 8:56 AM

## 2020-09-19 NOTE — Patient Instructions (Addendum)
Medication Instructions:  -Start Hydrochlorothiazide 12.5 mg daily. If after a week you still notice swelling, you may increase to 25 mg daily.  -Increase Atorvastatin to 40 mg daily.   *If you need a refill on your cardiac medications before your next appointment, please call your pharmacy*   Lab Work: Your physician recommends that you return for lab work 1 week prior to 2 month appointment ( [place] lipid/CMP).    Testing/Procedures: Your physician has requested that you have a lexiscan myoview. For further information please visit [email or web address]. Please follow instruction sheet, as given. [place]    Follow-Up: At [facility], you and your health needs are our priority.  As part of our continuing mission to provide you with exceptional heart care, we have created designated Provider Care Teams.  These Care Teams include your primary Cardiologist (physician) and Advanced Practice Providers (APPs -  Physician Assistants and Nurse Practitioners) who all work together to provide you with the care you need, when you need it.  We recommend signing up for the patient portal called "MyChart".  Sign up information is provided on this After Visit Summary.  MyChart is used to connect with patients for Virtual Visits (Telemedicine).  Patients are able to view lab/test results, encounter notes, upcoming appointments, etc.  Non-urgent messages can be sent to your provider as well.   To learn more about what you can do with MyChart, go to [email or web address].    Your next appointment:   2 month(s)  The format for your next appointment:   In Person  Provider:   Shelva Majestic, MD  You are scheduled for a Myocardial Perfusion Imaging Study on.  Please arrive 15 minutes prior to your appointment time for registration and insurance purposes.  The test will take approximately 3 to 4 hours to complete; you may bring reading material.  If someone comes with you to  your appointment, they will need to remain in the main lobby due to limited space in the testing area. **If you are pregnant or breastfeeding, please notify the nuclear lab prior to your appointment**  How to prepare for your Myocardial Perfusion Test: . Do not eat or drink 3 hours prior to your test, except you may have water. . Do not consume products containing caffeine (regular or decaffeinated) 12 hours prior to your test. (ex: coffee, chocolate, sodas, tea). . Do bring a list of your current medications with you.  If not listed below, you may take your medications as normal. . Do wear comfortable clothes (no dresses or overalls) and walking shoes, tennis shoes preferred (No heels or open toe shoes are allowed). . Do NOT wear cologne, perfume, aftershave, or lotions (deodorant is allowed). . If these instructions are not followed, your test will have to be rescheduled.  Please report to [place], [address] for your test.  If you have questions or concerns about your appointment, you can call the Nuclear Lab at [phone].  If you cannot keep your appointment, please provide 24 hours notification to the Nuclear Lab, to avoid a possible $50 charge to your account.

## 2020-09-20 ENCOUNTER — Telehealth: Payer: Self-pay | Admitting: Cardiovascular Disease

## 2020-09-20 NOTE — Telephone Encounter (Signed)
Spoke with patient who states she got an alert on her phone that she is to take atenolol 25mg  BID but she has been taking 12.5mg  BID. Reviewed MD note, which references the latter. Explained that atenolol was NOT changes at yesterday's visit and that she should continue atenolol 12.5mg  BID as she has been taking. No refill needed. No furhter assistance needed.

## 2020-09-20 NOTE — Telephone Encounter (Signed)
Pt c/o medication issue:  1. Name of Medication: atenolol (TENORMIN) 25 MG tablet  2. How are you currently taking this medication (dosage and times per day)? Patient takes 12.5 mg BID  3. Are you having a reaction (difficulty breathing--STAT)? no  4. What is your medication issue? Patient states on her med list that the instructions are to take 25 mg BID. She would like to get the rx updated so that the drug store gives her the correct amount of medication

## 2020-09-27 ENCOUNTER — Ambulatory Visit (HOSPITAL_COMMUNITY)
Admission: RE | Admit: 2020-09-27 | Discharge: 2020-09-27 | Disposition: A | Discharge status: Home / Self Care | Payer: Medicare Other | Source: Ambulatory Visit | Attending: Cardiovascular Disease | Admitting: Cardiovascular Disease

## 2020-09-27 ENCOUNTER — Other Ambulatory Visit: Payer: Self-pay

## 2020-09-27 DIAGNOSIS — I2510 Atherosclerotic heart disease of native coronary artery without angina pectoris: Secondary | ICD-10-CM | POA: Diagnosis not present

## 2020-09-27 DIAGNOSIS — R0600 Dyspnea, unspecified: Secondary | ICD-10-CM

## 2020-09-27 LAB — MYOCARDIAL PERFUSION IMAGING
LV dias vol: 66 mL (ref 46–106)
LV sys vol: 20 mL
Peak HR: 83 {beats}/min
Rest HR: 58 {beats}/min
SDS: 2
SRS: 1
SSS: 3
TID: 0.88

## 2020-09-27 MED ORDER — TECHNETIUM TC 99M TETROFOSMIN IV KIT
30.7000 | PACK | Freq: Once | INTRAVENOUS | Status: AC | PRN
  Administered 2020-09-27: 30.7 via INTRAVENOUS
  Filled 2020-09-27: qty 31

## 2020-09-27 MED ORDER — TECHNETIUM TC 99M TETROFOSMIN IV KIT
10.4000 | PACK | Freq: Once | INTRAVENOUS | Status: AC | PRN
  Administered 2020-09-27: 10.4 via INTRAVENOUS
  Filled 2020-09-27: qty 11

## 2020-09-27 MED ORDER — REGADENOSON 0.4 MG/5ML IV SOLN
0.4000 mg | Freq: Once | INTRAVENOUS | Status: AC
  Administered 2020-09-27: 0.4 mg via INTRAVENOUS

## 2020-10-23 NOTE — Telephone Encounter (Signed)
Pt updated with test results

## 2020-11-12 LAB — LIPID PANEL
Chol/HDL Ratio: 2.4 ratio (ref 0.0–4.4)
Cholesterol, Total: 130 mg/dL (ref 100–199)
HDL: 55 mg/dL (ref >39)
LDL Chol Calc (NIH): 63 mg/dL (ref 0–99)
Triglycerides: 53 mg/dL (ref 0–149)
VLDL Cholesterol Cal: 12 mg/dL (ref 5–40)

## 2020-11-12 LAB — COMPREHENSIVE METABOLIC PANEL
ALT: 34 IU/L — ABNORMAL HIGH (ref 0–32)
AST: 28 IU/L (ref 0–40)
Albumin/Globulin Ratio: 2.3 — ABNORMAL HIGH (ref 1.2–2.2)
Albumin: 4.1 g/dL (ref 3.8–4.8)
Alkaline Phosphatase: 88 IU/L (ref 44–121)
BUN/Creatinine Ratio: 34 — ABNORMAL HIGH (ref 12–28)
BUN: 26 mg/dL (ref 8–27)
Bilirubin Total: 0.6 mg/dL (ref 0.0–1.2)
CO2: 25 mmol/L (ref 20–29)
Calcium: 9.6 mg/dL (ref 8.7–10.3)
Chloride: 103 mmol/L (ref 96–106)
Creatinine, Ser: 0.77 mg/dL (ref 0.57–1.00)
Globulin, Total: 1.8 g/dL (ref 1.5–4.5)
Glucose: 88 mg/dL (ref 65–99)
Potassium: 4 mmol/L (ref 3.5–5.2)
Sodium: 140 mmol/L (ref 134–144)
Total Protein: 5.9 g/dL — ABNORMAL LOW (ref 6.0–8.5)
eGFR: 84 mL/min/{1.73_m2} (ref >59)

## 2020-11-18 ENCOUNTER — Telehealth: Payer: Self-pay

## 2020-11-18 MED ORDER — HYDROCHLOROTHIAZIDE 25 MG PO TABS: ORAL_TABLET | ORAL | 3 refills | Status: DC

## 2020-11-18 NOTE — Telephone Encounter (Signed)
Ok to renew with 25 mg pill, and she can take either 1/2 or 1 daily.

## 2020-11-18 NOTE — Telephone Encounter (Addendum)
When calling patient to relay blood work results, pt reports Dr. Claiborne Billings had increased her hydrochlorothiazide to 12.'5mg'$ /daily with the option to take another 12.'5mg'$  for persistent swelling. She reports that she has been taking '25mg'$ /day most days to control her leg swelling. She denies shortness of breath or weight gain at this time. She reports that due to taking '25mg'$ /day rather than 12.'5mg'$ /day she is running out of medication but the pharmacy will not refill. Pt has office visit 11/20/2020. Advised pt nurse would consult Dr. Claiborne Billings about refilling prescription and make him aware of her dose change.

## 2020-11-18 NOTE — Telephone Encounter (Addendum)
Called patient, relayed the following from Dr. Claiborne Billings:  Troy Sine, MD   Physician  Cardiology  Telephone Encounter  Signed  Creation Time:  11/18/2020  5:50 PM           Signed      Ok to renew with 25 mg pill, and she can take either 1/2 or 1 daily.          Orders placed. Pt verbalized understanding of medication instruction.

## 2020-11-20 ENCOUNTER — Ambulatory Visit: Payer: Medicare Other | Admitting: Cardiovascular Disease

## 2020-11-20 ENCOUNTER — Encounter: Payer: Self-pay | Admitting: Cardiovascular Disease

## 2020-11-20 ENCOUNTER — Other Ambulatory Visit: Payer: Self-pay

## 2020-11-20 VITALS — BP 116/60 | HR 53 | Ht 67.0 in | Wt 109.0 lb

## 2020-11-20 DIAGNOSIS — R0600 Dyspnea, unspecified: Secondary | ICD-10-CM | POA: Diagnosis not present

## 2020-11-20 DIAGNOSIS — E785 Hyperlipidemia, unspecified: Secondary | ICD-10-CM

## 2020-11-20 DIAGNOSIS — I2510 Atherosclerotic heart disease of native coronary artery without angina pectoris: Secondary | ICD-10-CM

## 2020-11-20 DIAGNOSIS — M7989 Other specified soft tissue disorders: Secondary | ICD-10-CM

## 2020-11-20 DIAGNOSIS — Z79899 Other long term (current) drug therapy: Secondary | ICD-10-CM

## 2020-11-20 DIAGNOSIS — I700 Atherosclerosis of aorta: Secondary | ICD-10-CM | POA: Diagnosis not present

## 2020-11-20 DIAGNOSIS — R0609 Other forms of dyspnea: Secondary | ICD-10-CM

## 2020-11-20 DIAGNOSIS — Z87891 Personal history of nicotine dependence: Secondary | ICD-10-CM

## 2020-11-20 MED ORDER — FUROSEMIDE 20 MG PO TABS: 20.0000 mg | ORAL_TABLET | Freq: Every day | ORAL | 3 refills | Status: DC

## 2020-11-20 NOTE — Progress Notes (Signed)
Patient ID: [name], female   DOB: [birth year], 70 y.o.   MRN: [id number]    PCP: [name]  HPI: [name] is a 70 y.o. female resents to the office today for a 2 month followup cardiology evaluation.   [name] has a history of supraventricular tachycardia which has been fairly well-controlled on combination verapamil SR  plus beta blocker therapy with atenolol.  She has a history of underlying mitral valve prolapse, mild carotid disease, hyperlipidemia, as well as mild COPD. She had smoked for over 30 years but quit smoking.  She was hospitalized after being bitten by a copperhead snake and required 5 days in the hospital. Apparently her cardiac medicines were held at that time and during the hospitalization she had recurrent episodes of tachycardia dysrhythmia. As long as she takes her medicines, and she feels that her heart rhythm is controlled. There is rare intermittent palpitations.typically, these episodes may last for under a minute and typically resolve with the Valsalva maneuver if they don't resolve spontaneously.  When I saw her in 2017 she was unaware of any breakthrough arrhythmias.  At that time her blood pressure was low and there was mild orthostatic drop.  Her resting pulse was 60.  I recommended that she continue to take Verapamil SR 240 mg but I reduced her atenolol dose to 12.5  mg twice a day.  She underwent a follow-up echo Doppler study on 10/25/2015.  This showed an EF of 60-65%.  There was normal wall motion.  There was no significant valvular pathology.  She was given preoperative clearance to undergo hip surgery by Dr. Fredonia [name] which was done in July 2017 and she tolerated this well.  I last saw her, she had undergone  a panoramic dental x-ray.  Incidentally, she was noted to have calcified carotid atheromas of the internal carotid artery suggestive of atherosclerotic disease.  I scheduled her for carotid duplex imaging which showed heterogeneous plaque  bilaterally with narrowing in the 1-39% range.  She had normal subclavian arteries, and patent vertebral arteries with antegrade flow.  She resumed smoking after being off cigarettes for 2 years.  She resumed smoking after a death of a family member.    I saw her in May 2019 after not having seen her in over a year., she denies any episodes of chest pain or awareness of palpitations.  She was concerned about her weight loss.  She had resumed smoking but quit 1 month previous to her last evaluation.  Her smoking duration has been over 40 years.  She admits to exertional dyspnea.  She denies any prolonged cough.  In July 2018, LDL cholesterol was 78 on atorvastatin and only 5 mg.  She has continued to be on atenolol 12.5 mg twice a day and verapamil 240 mg daily.  She is on aspirin.  In December 2018 carotid duplex imaging showed mild bilateral carotid plaque which was not hemodynamically significant.  Due to weight loss I referred her to Sara Form, NP of pulmonary so that she would qualify for a low-dose CT of her chest to evaluate for possible lung CA screening protocol.  This apparently did not show any cancer but there was aortic atherosclerosis in addition to left main and two-vessel CAD noted on the CT scan.  She had mild diffuse bronchial wall thickening with mild centrilobular and paraseptal emphysema, with imaging findings suggestive of underlying COPD.  When I saw her in January 27, 2019 she previously had  an episode  of food poisoning associated with dehydration and small bowel obstruction leading to a hospitalization.  She admits to weight loss.  She denied any anginal type symptoms.  She was continuing to smoke cigarettes.  She is now set up to undergo yearly lung radiation CT of her chest for lung CA screening.Marland Kitchen  She was evaluated by me in October 2021.  Since her prior evaluation she continued to feel well.  She was still smoking  smoking and her smoking has been reduced to 5 cigarettes/day.  She  had undergone laboratory by Dr. Cheron [name] in April 2021 which showed a total cholesterol of 129, LDL 74 HDL 20 triglycerides 68.  She walks 2 miles per day typically drinks 5 to 7 days/week.  Back in December 19, 2019 she underwent a yearly follow-up CT of her chest was cancer screening.  This essentially was unchanged and showed benign appearance.  She again was noted to have her centrilobular emphysema.  There was aortic atherosclerosis as well as coronary artery atherosclerosis.    I last saw her on Sep 19, 2020 and since her prior evaluation she had remained stable without chest pain but admitted to shortness of breath with activity.  She has documented centrilobular emphysema.  She has been trying to walk up to 2 miles per day.  Over the prior 3 weeks she noticed development of lower extremity bilateral edema left greater than right which is pitting.  She was still smoking approximately 4 cigarettes/day. She had undergone laboratory by her primary physician.  Several months ago her atorvastatin was increased to 20 mg.  LDL on her increased atorvastatin on August 15, 2020 was 88 with total cholesterol 131, HDL 36 and triglycerides 79.  Chemistry was normal.  With her documented coronary calcification and aortic atherosclerosis I recommended more aggressive lipid management with target LDL in the 50s and 60s if at all possible.  As result atorvastatin was increased to 40 mg daily.  She was experiencing some leg edema and initiated HCTZ 12.5 mg daily and if after 1 week edema was still present she could increase this to 25 mg as needed and go back to 12.5 mg if resolved.  Since I last saw her, she was successful in smoking cessation and quit tobacco the day after her office visit on Sep 20, 2020.  She has continued to experience pretibial edema despite taking HCTZ at 25 mg.  She denies chest pain.  She is tolerating rosuvastatin.  She underwent a Lexiscan Myoview study which was low risk and revealed hyperdynamic  LV function with EF greater than 65% and no evidence for scar or ischemia.  She presents for follow-up evaluation.  Past Medical History:  Diagnosis Date   Anxiety    Back pain, chronic    Bowel obstruction (HCC)    Complication of anesthesia    PT HAS POST OP URINARY RETENTION:  per patient "difficult stick- needs central line or picc"  04-06-2017 AT [facility] IV STARTED BY CRNA NO ISSUE   COPD with emphysema ([place])    DDD (degenerative disc disease), cervical    POST MULTIPLE SURGERY'S   DDD (degenerative disc disease), lumbosacral    POST MULTIPLE SURGERY'S   Difficult intravenous access    Fecal incontinence    once a week----  TREATED W/ SACRAL NERVE STIMULATOR PLACED 12/ 2018   Heart murmur    History of chronic bronchitis    History of spinal cord injury 02/09/2007   PT FELL-- S/P  CERVICAL SPINE SURGERY   History of urinary retention    PT HAS POST OP URINARY RETENTION DUE TO ANESTHESIA   History of venomous snake bite 2013   RIGHT MIDDLE FINGER BY COOPERHEAD--- TREATMENT W/ ANTIVENOM   Hyperlipemia    IBS (irritable bowel syndrome)    flairs up occasionally esp if stressed   Incomplete right bundle branch block    Major depression    Mild carotid artery disease [facility] cardiologist-  dr Tresa Endo   mild bilateral ICA per duplex 04-12-2017  1-39%   MVP (mitral valve prolapse)    no evidence stenosis or regurgitation per last echo 10-25-2015   OA (osteoarthritis)    PONV (postoperative nausea and vomiting)    Pre-diabetes    PSVT (paroxysmal supraventricular tachycardia) [facility])    cardiologist-- dr Tresa Endo    Past Surgical History:  Procedure Laterality Date   ABDOMINAL RECTOPEXY  2011       dr fuller  [facility])   prolapse rectum   ANAL RECTAL MANOMETRY N/A 03/01/2017   Procedure: ANO RECTAL MANOMETRY;  Surgeon: Romie Levee, MD;  Location: [facility];  Service: Endoscopy;  Laterality: N/A;   ANTERIOR CERVICAL DECOMP/DISCECTOMY FUSION  05-28-2000    dr Otelia Sergeant   C3-4  &   C7-T1   ANTERIOR CERVICAL DECOMPRESSION LAMINECTOMY AND FUSION  02-09-2007    dr Lovell Sheehan   decompression/ laminectomy C3-4 & C5-6;  laminectomy C2; fusion C2-5   BREAST SURGERY  1976   breast reduction   COLONOSCOPY     FORAMINOTOMY 1 LEVEL  11-03-1999   dr Otelia Sergeant  [facility]   left C7-T1 w/ decompression left C8   KNEE SURGERY  AGE 37   bone spur     LUMBAR LAMINECTOMY/ DECOMPRESSION WITH MET-RX  02-09-2005  dr Havery Moros   L2-3 laminectomy left cage; fusion L2-4   POSTERIOR CERVICAL FUSION/FORAMINOTOMY  12-08-2000    dr Otelia Sergeant   right forminotomy C7-T1 and posterior fusion   RIGHT THUMB COLLATERAL LIGAMENT RECONSTRUCTION  02-08-2003   dr Amanda Pea   RIGHT THUMB LIGAMENT REPAIR, ARTHROTOMY, SYNOVECTOMY  10-26-2002    dr Amanda Pea   SPINE SURGERY  1983 to 01/2007   total 14 surgery's  cervical and lumbar  (including fusion's, diskectomy, laminectomy's, foraminotomy's)   TONSILLECTOMY     TOTAL HIP ARTHROPLASTY Right 07/31/2014   Procedure: TOTAL HIP ARTHROPLASTY ANTERIOR APPROACH;  Surgeon: Sheral Apley, MD;  Location: [facility] OR;  Service: Orthopedics;  Laterality: Right;   TOTAL HIP ARTHROPLASTY Left 11/19/2015   Procedure: TOTAL HIP ARTHROPLASTY ANTERIOR APPROACH;  Surgeon: Sheral Apley, MD;  Location: [facility] OR;  Service: Orthopedics;  Laterality: Left;   TRANSTHORACIC ECHOCARDIOGRAM  10-25-2015   dr t. Tresa Endo   ef 60-655/  no evidence MV stenosis or regurgitation/     TUBAL LIGATION      Allergies  Allergen Reactions   Bupropion Hcl Swelling   Celecoxib Swelling   Naproxen Swelling   Methadone Nausea And Vomiting   Morphine Itching    Current Outpatient Medications  Medication Sig Dispense Refill   aspirin EC 81 MG tablet Take 81 mg by mouth at bedtime.     atenolol (TENORMIN) 25 MG tablet TAKE 1/2 TABLET BY MOUTH 2 TIMES DAILY. 90 tablet 2   atorvastatin (LIPITOR) 40 MG tablet Take 1 tablet (40 mg total) by mouth daily. 90 tablet 3   baclofen (LIORESAL) 20 MG tablet Take 20 mg by mouth 3  (three) times daily as needed for muscle spasms.  Calcium Carbonate-Vitamin D (CALCIUM + D PO) Take 1 tablet by mouth 2 (two) times daily. Calcium Magnesium Zinc and Vitamin D     clonazePAM (KLONOPIN) 1 MG tablet Take 1 mg by mouth at bedtime.     fish oil-omega-3 fatty acids 1000 MG capsule Take 1 g by mouth at bedtime.      furosemide (LASIX) 20 MG tablet Take 1 tablet (20 mg total) by mouth daily. 90 tablet 3   Multiple Vitamin (MULTIVITAMIN WITH MINERALS) TABS tablet Take 1 tablet by mouth daily.     omeprazole (PRILOSEC) 20 MG capsule Take 20 mg by mouth daily.     verapamil (CALAN-SR) 240 MG CR tablet TAKE 1 TABLET BY MOUTH AT BEDTIME. 90 tablet 2   No current facility-administered medications for this visit.    Social History   Socioeconomic History   Marital status: Married    Spouse name: CARROLL   Number of children: 0   Years of education: 16   Highest education level: Bachelor's degree (e.g., BA, AB, BS)  Occupational History   Occupation: Nursing-RN    RETIRED    Employer: [employer]  Tobacco Use   Smoking status: Former    Packs/day: 0.75    Years: 42.00    Pack years: 31.50    Types: Cigarettes    Quit date: 08/08/2017    Years since quitting: 3.2   Smokeless tobacco: Never  Vaping Use   Vaping Use: Never used  Substance and Sexual Activity   Alcohol use: Not Currently    Alcohol/week: 5.0 - 6.0 standard drinks    Types: 5 - 6 Glasses of wine per week    Comment: no longer drinks-stopped approx 6 yrs ago   Drug use: No   Sexual activity: Yes    Birth control/protection: None  Other Topics Concern   Not on file  Social History Narrative   Not on file   Social Determinants of Health   Financial Resource Strain: Not on file  Food Insecurity: Not on file  Transportation Needs: Not on file  Physical Activity: Not on file  Stress: Not on file  Social Connections: Not on file  Intimate Partner Violence: Not on file   Social history is notable in  that she is married. She has one stepchild and 2 stepgrandchildren. She does walk.  Fortunately she quit tobacco on Sep 20, 2020  Family History  Problem Relation Age of Onset   COPD Father    Diabetes Mother    Heart disease Mother    Kidney disease Mother    Hypertension Mother     ROS General: Negative; No fevers, chills, or night sweats;  HEENT: Negative; No changes in vision or hearing, sinus congestion, difficulty swallowing Pulmonary: positive for mild COPD; No cough, wheezing, shortness of breath, hemoptysis Cardiovascular: See HPI GI: Positive for irritable bowel syndrome GU: Negative; No dysuria, hematuria, or difficulty voiding Musculoskeletal: Status post left hip replacement surgery. Hematologic/Oncology: Negative; no easy bruising, bleeding Endocrine: Negative; no heat/cold intolerance; no diabetes Neuro: Negative; no changes in balance, headaches Skin: Negative; No rashes or skin lesions Psychiatric: positive for mild depression for which she takes Zoloft.; No behavioral problems,  Sleep: Negative; No snoring, daytime sleepiness, hypersomnolence, bruxism, restless legs, hypnogognic hallucinations, no cataplexy Other comprehensive 14 point system review is negative.   PE BP 116/60   Pulse (!) 53   Ht 5\' 7"  (1.702 m)   Wt 109 lb (49.4 kg)   SpO2 96%  BMI 17.07 kg/m    Repeat blood pressure by me was 128/70  Wt Readings from Last 3 Encounters:  11/20/20 109 lb (49.4 kg)  09/27/20 104 lb (47.2 kg)  09/19/20 104 lb 12.8 oz (47.5 kg)   General: Alert, oriented, no distress.  Skin: normal turgor, no rashes, warm and dry HEENT: Normocephalic, atraumatic. Pupils equal round and reactive to light; sclera anicteric; extraocular muscles intact;  Nose without nasal septal hypertrophy Mouth/Parynx benign; Mallinpatti scale 2 Neck: No JVD, no carotid bruits; normal carotid upstroke Lungs: clear to ausculatation and percussion; no wheezing or rales Chest wall:  without tenderness to palpitation Heart: PMI not displaced, RRR, s1 s2 normal, 1/6 systolic murmur, no diastolic murmur, no rubs, gallops, thrills, or heaves Abdomen: soft, nontender; no hepatosplenomehaly, BS+; abdominal aorta nontender and not dilated by palpation. Back: no CVA tenderness Pulses 2+ Musculoskeletal: full range of motion, normal strength, no joint deformities Extremities: Pretibial edema at least 1+, slightly more prominent on the left.  When I was able to apply pressure downward, the edema resolved pretibially towards the feet.  No clubbing, cyanosis, Homan's sign negative  Neurologic: grossly nonfocal; Cranial nerves grossly wnl Psychologic: Normal mood and affect   November 20, 2020 ECG (independently read by me):  Sinus bradycardia at 53; PR 200 msec, no ectopy  Sep 19, 2020 ECG (independently read by me): NSR at 61; LAD      October 2021 ECG (independently read by me): NSR at 79; QS V1-3; no ectopy  February 02, 2020 ECG (independently read by me): NSR at 71; Probable bistrial enlargement QS V1-26 Aug 2017 ECG (independently read by me): Sinus bradycardia 55 bpm.  QS complex V1 V2.  Normal intervals.  No ectopy.  May 2018 ECG (independently read by me): Sinus bradycardia 56 bpm.  QS V1, V2.  Mild RV conduction delay.  Normal intervals.  No ST segment changes.  November 2017 ECG (independently read by me): Sinus bradycardia 52 bpm.  QRS complex V1 V2.  Normal intervals.  June 2017 ECG (independently read by me): Normal sinus rhythm at 60 bpm.  First-degree AV block with a PR interval of 220 ms.  No significant ST changes.  Incomplete right bundle branch block.   November 2016 ECG (independently read by me): normal sinus rhythm with first-degree heart block with a PR interval at 270 ms.  QRS complex V1 V2.  November 2015 ECG (independently read by me.  (: Normal sinus rhythm at 64 bpm.  Borderline first degree AV block with a PR interval at 204 ms.  Mild RV conduction  delay.  Anteroseptal Q waves, unchanged  October 2014ECG: Sinus rhythm with incomplete right bundle branch block. Anteroseptal Q waves unchanged.  LABS:  BMP Latest Ref Rng & Units 11/11/2020 01/12/2019 01/10/2019  Glucose 65 - 99 mg/dL 88 78 69(L)  BUN 8 - 27 mg/dL 26 13 <5(L)  Creatinine 0.57 - 1.00 mg/dL 0.77 0.75 0.64  BUN/Creat Ratio 12 - 28 34(H) - -  Sodium 134 - 144 mmol/L 140 141 144  Potassium 3.5 - 5.2 mmol/L 4.0 3.9 3.2(L)  Chloride 96 - 106 mmol/L 103 105 108  CO2 20 - 29 mmol/L $RemoveB'25 26 26  'LcPWtWhq$ Calcium 8.7 - 10.3 mg/dL 9.6 9.4 8.3(L)   Hepatic Function Latest Ref Rng & Units 11/11/2020 01/12/2019 01/07/2019  Total Protein 6.0 - 8.5 g/dL 5.9(L) 6.1(L) 5.4(L)  Albumin 3.8 - 4.8 g/dL 4.1 3.7 3.1(L)  AST 0 - 40 IU/L 28 75(H) 40  ALT 0 - 32 IU/L 34(H) 76(H) 42  Alk Phosphatase 44 - 121 IU/L 88 77 68  Total Bilirubin 0.0 - 1.2 mg/dL 0.6 0.9 1.2  Bilirubin, Direct 0.0 - 0.3 mg/dL - - -   CBC Latest Ref Rng & Units 01/12/2019 01/08/2019 01/07/2019  WBC 4.0 - 10.5 K/uL 6.4 10.8(H) 12.4(H)  Hemoglobin 12.0 - 15.0 g/dL 16.4(H) 15.4(H) 14.8  Hematocrit 36.0 - 46.0 % 48.2(H) 47.0(H) 44.9  Platelets 150 - 400 K/uL 222 150 176   Lab Results  Component Value Date   MCV 101.5 (H) 01/12/2019   MCV 101.5 (H) 01/08/2019   MCV 101.6 (H) 01/07/2019   Lab Results  Component Value Date   TSH 1.010 11/03/2016   Lipid Panel     Component Value Date/Time   CHOL 130 11/11/2020 1024   TRIG 53 11/11/2020 1024   HDL 55 11/11/2020 1024   CHOLHDL 2.4 11/11/2020 1024   CHOLHDL 3.3 02/28/2015 1023   VLDL 30 02/28/2015 1023   LDLCALC 63 11/11/2020 1024     RADIOLOGY: No results found.  IMPRESSION:  1. Coronary artery calcification seen on CAT scan   2. Aortic atherosclerosis ([place])   3. DOE (dyspnea on exertion)   4. Leg swelling   5. Hyperlipidemia with target LDL less than 70   6. Medication management   7. History of tobacco abuse: Quit Sep 20, 2020     ASSESSMENT AND PLAN: Ms.  Cogan is a 70 year-old female who has a history of SVT which had been fairly well-controlled over many years on verapamil 240 mg as well as atenolol 12.5 mg twice a day.  She is unaware of any breakthrough SVT episodes.  She has a greater than 40-year history of tobacco use and in the past has had intermittently quit for short duration.  Fortunately, since her last office visit with me on Sep 19, 2020 she quit tobacco the following day.   In August 2020 a myocardial perfusion study showed normal perfusion without ischemia or infarction, EF of 66%.  This study was done after she had a CT scanning which demonstrated coronary calcification with left main and two-vessel CAD noted on CT imaging.  CT showed mild centrilobular and paraseptal emphysema and underlying COPD.  I reviewed her most recent CT which was essentially  unchanged.  With her multivessel coronary calcification, she was referred for a follow-up nuclear perfusion study which was done on September 27, 2020.  This showed mild apical thinning which was artifactual.  She had hyperdynamic LV function without scar or ischemia.  With her aortic atherosclerosis and coronary calcification, I had stressed more aggressive lipid-lowering therapy.  She is now is on an increased regimen of atorvastatin 40 mg daily and is tolerating this well.  Most recent lipid panel from November 11, 2020 are now excellent with LDL cholesterol at 63.  Total cholesterol was 130, triglycerides 53, and HDL 55.  She also takes over-the-counter fish oil.  She continues to have leg edema despite taking HCTZ 25 mg.  I have recommended discontinuance of hydrochlorothiazide and she will continue furosemide 20 mg daily.  She continues to be on verapamil 240 mg and atenolol 12.5 mg twice a day.  Blood pressure today is stable.  In 2 weeks I have recommended a follow-up be met with her change to furosemide.  I will see her in 4 months for reevaluation.   Troy Sine, MD, [facility]  11/24/2020 1:21  PM

## 2020-11-20 NOTE — Telephone Encounter (Signed)
Please see other phone note from 11/18/2020. This phone note created in error.

## 2020-11-20 NOTE — Patient Instructions (Addendum)
Medication Instructions:  STOP taking hydrochlorothiazide.   BEGIN furosemide (Lasix) '20mg'$  (1 tablet) daily.   *If you need a refill on your cardiac medications before your next appointment, please call your pharmacy*   Lab Work: In 2 weeks - BMET  If you have labs (blood work) drawn today and your tests are completely normal, you will receive your results only by: [place] (if you have MyChart) OR A paper copy in the mail If you have any lab test that is abnormal or we need to change your treatment, we will call you to review the results.   Testing/Procedures: None ordered.    Follow-Up: At [facility], you and your health needs are our priority.  As part of our continuing mission to provide you with exceptional heart care, we have created designated Provider Care Teams.  These Care Teams include your primary Cardiologist (physician) and Advanced Practice Providers (APPs -  Physician Assistants and Nurse Practitioners) who all work together to provide you with the care you need, when you need it.  We recommend signing up for the patient portal called "MyChart".  Sign up information is provided on this After Visit Summary.  MyChart is used to connect with patients for Virtual Visits (Telemedicine).  Patients are able to view lab/test results, encounter notes, upcoming appointments, etc.  Non-urgent messages can be sent to your provider as well.   To learn more about what you can do with MyChart, go to [email or web address].    Your next appointment:   4 month(s)  The format for your next appointment:   In Person  Provider:   Shelva Majestic, MD

## 2020-11-24 ENCOUNTER — Encounter: Payer: Self-pay | Admitting: Cardiovascular Disease

## 2020-11-26 IMAGING — DX DG ABD PORTABLE 1V
1 series · 1 of 1 positions shown · non-contrast
Comparison: CT abdomen pelvis 01/07/2019

CLINICAL DATA: Confirmation of nasogastric tube placement.

EXAM:
PORTABLE ABDOMEN - 1 VIEW

[abdomen kub]
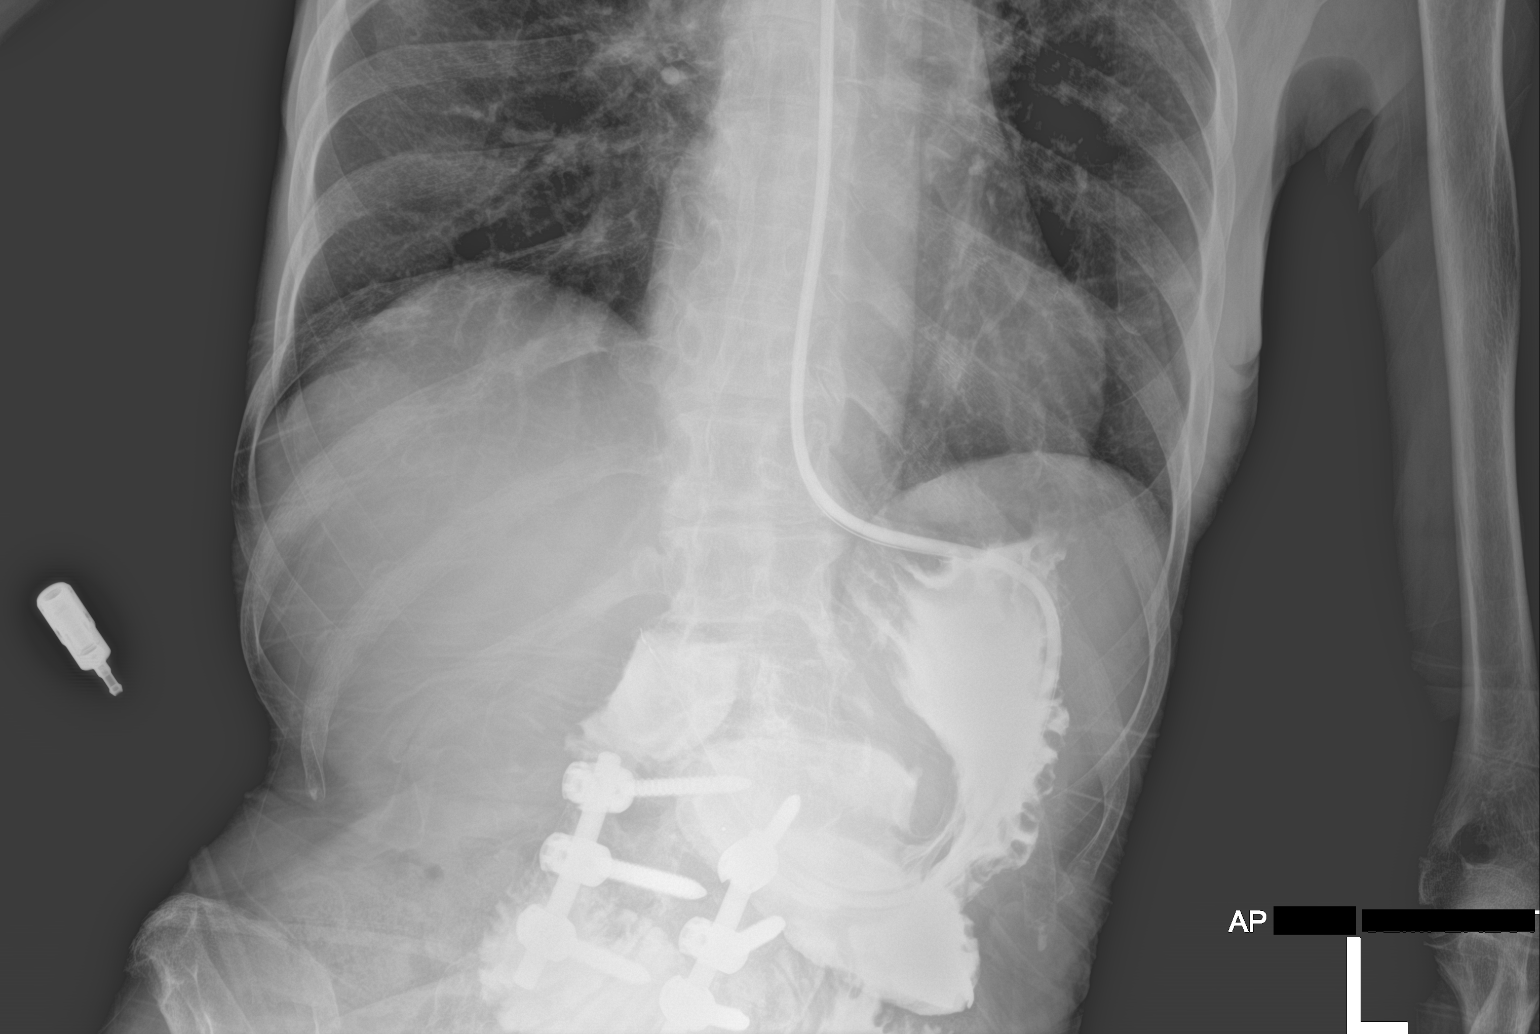

[1 of 1 positions shown; findings below may reference images not displayed]

FINDINGS: Contrast opacification of the stomach and proximal small bowel with
tip of the NG tube position at the level of duodenal bulb. A side
port is not visualized the suspect this is within the gastric lumen
given the significant opacification of the rugal folds.

Lumbar fixation hardware is noted. Redemonstration of the chest wall
deformity in severe scoliotic curvature of the spine.
IMPRESSION: NG tube tip at the level of duodenal bulb.

## 2020-11-26 IMAGING — DX DG ABD PORTABLE 1V
1 series · 1 of 1 positions shown · non-contrast
Comparison: Abdominal radiograph earlier same day.

CLINICAL DATA: Small-bowel obstruction.  Delayed film.

EXAM:
PORTABLE ABDOMEN - 1 VIEW

[abdomen]
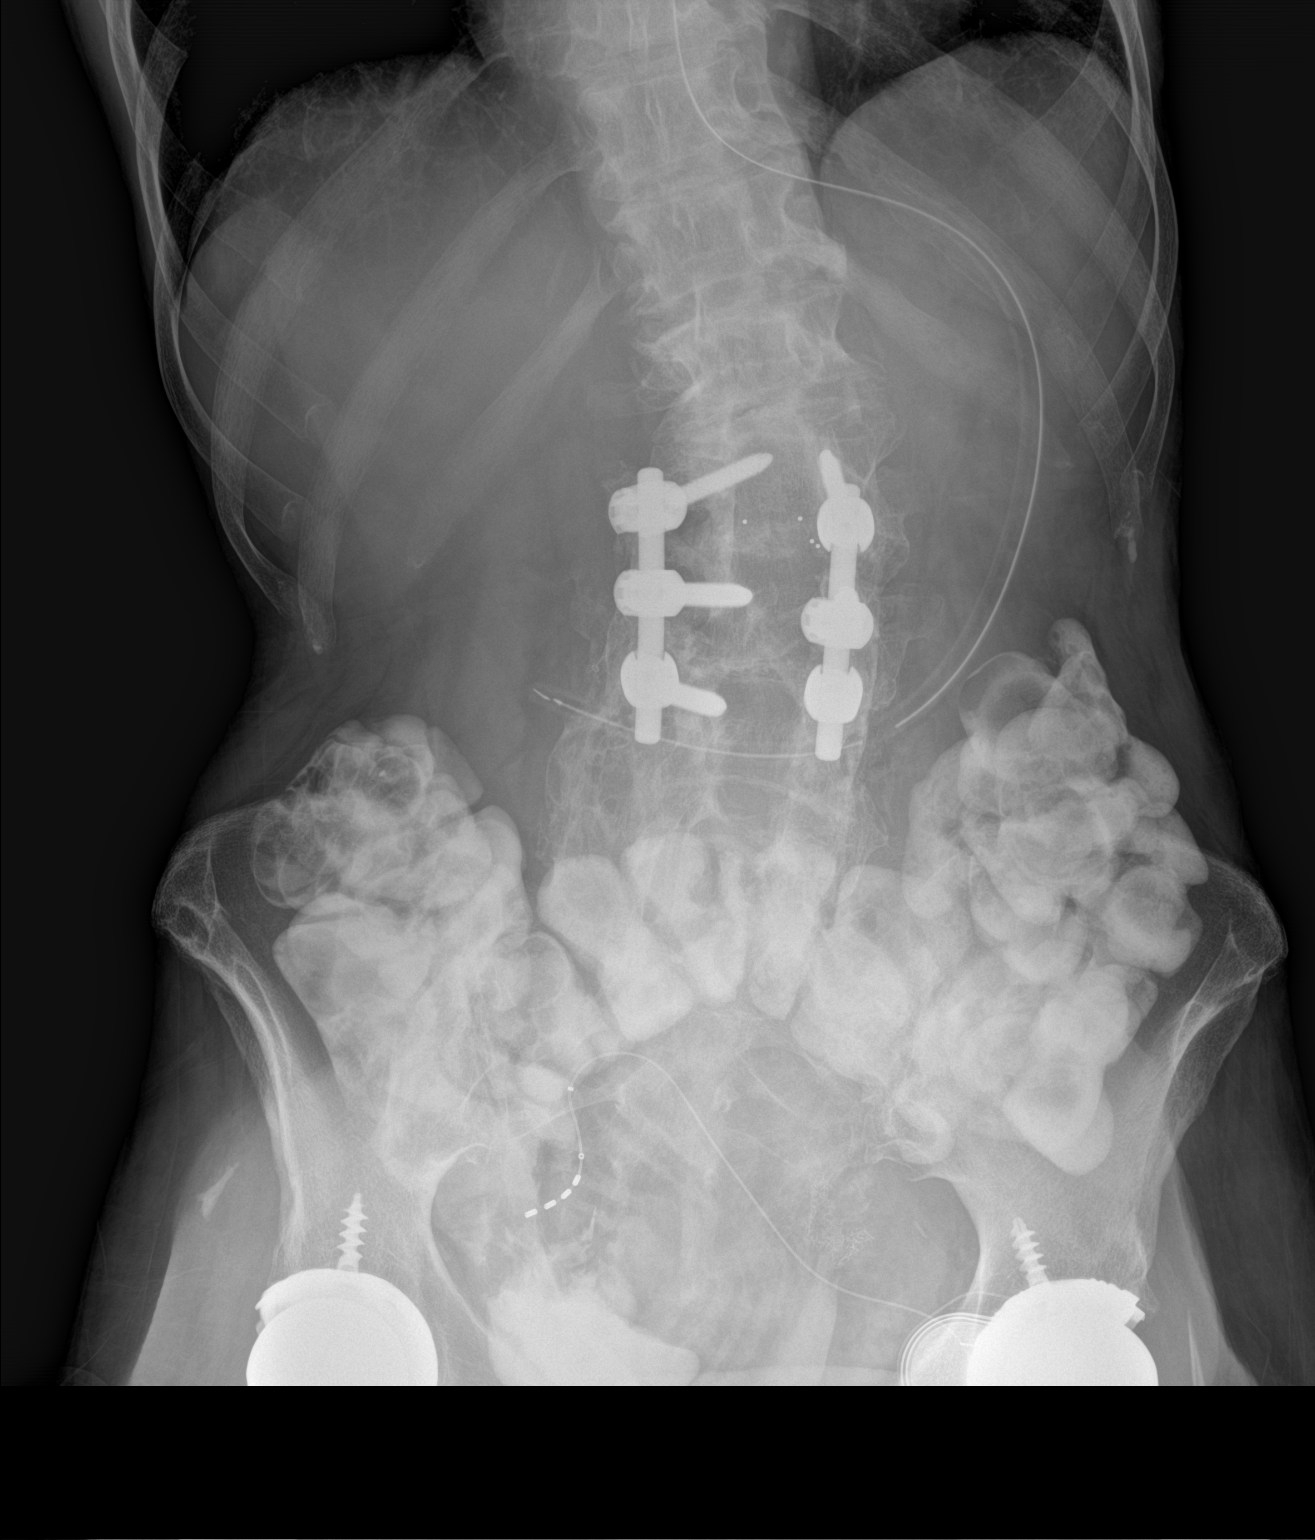

[1 of 1 positions shown; findings below may reference images not displayed]

FINDINGS: Enteric tube tip and side-port project over the stomach. Oral
contrast material is demonstrated within the colon. What cirrhotic
stimulator device projects over the pelvis. Bilateral hip
arthroplasties. Lower thoracic and lumbar spine degenerative
changes.
IMPRESSION: Oral contrast material has progressed the colon.

## 2020-11-28 ENCOUNTER — Ambulatory Visit
Admission: RE | Admit: 2020-11-28 | Discharge: 2020-11-28 | Disposition: A | Discharge status: Home / Self Care | Payer: Medicare Other | Source: Ambulatory Visit | Attending: General Surgery | Admitting: General Surgery

## 2020-11-28 ENCOUNTER — Other Ambulatory Visit: Payer: Self-pay | Admitting: General Surgery

## 2020-11-28 ENCOUNTER — Other Ambulatory Visit: Payer: Self-pay

## 2020-11-28 DIAGNOSIS — Z9682 Presence of neurostimulator: Secondary | ICD-10-CM

## 2020-12-06 LAB — BASIC METABOLIC PANEL
BUN/Creatinine Ratio: 25 (ref 12–28)
BUN: 20 mg/dL (ref 8–27)
CO2: 24 mmol/L (ref 20–29)
Calcium: 9.4 mg/dL (ref 8.7–10.3)
Chloride: 104 mmol/L (ref 96–106)
Creatinine, Ser: 0.8 mg/dL (ref 0.57–1.00)
Glucose: 100 mg/dL — ABNORMAL HIGH (ref 65–99)
Potassium: 4.3 mmol/L (ref 3.5–5.2)
Sodium: 141 mmol/L (ref 134–144)
eGFR: 81 mL/min/{1.73_m2} (ref >59)

## 2020-12-16 ENCOUNTER — Ambulatory Visit: Payer: Self-pay | Admitting: General Surgery

## 2020-12-16 NOTE — H&P (View-Only) (Signed)
PROVIDER:  Monico Blitz, MD  MRN: [id number] DOB: 1951-01-15 DATE OF ENCOUNTER: 12/16/2020 Subjective   Chief Complaint: fecal incontinence     History of Present Illness:   [name] is a 70 y.o. female who is seen today for worsening fecal incontinence.  This has been occurring for the past several months and is gradually getting worse.  No falls recently.  She has tried multiple programs with the company reps with no change in symptoms.  She was seen in the office recently and an x-ray was obtained.  This showed no change in the lead position.  She is here today for an impedance study. Past Medical History:  Diagnosis Date   Arrhythmia    COPD (chronic obstructive pulmonary disease) (CMS-HCC)    IBS (irritable bowel syndrome)    Past Surgical History:  Procedure Laterality Date   INSERTION / PLACEMENT / REVISION NEUROSTIMULATOR     JOINT REPLACEMENT     SPINE SURGERY     Family History  Problem Relation Age of Onset   Obesity Mother    High blood pressure (Hypertension) Mother    Hyperlipidemia (Elevated cholesterol) Mother    Coronary Artery Disease (Blocked arteries around heart) Mother    Diabetes Mother    Social History   Socioeconomic History   Marital status: Married  Tobacco Use   Smoking status: Former Smoker   Smokeless tobacco: Never Used  Scientific laboratory technician Use: Never used  Substance and Sexual Activity   Alcohol use: Not Currently   Drug use: Never    Current Outpatient Medications:    atenoloL (TENORMIN) 25 MG tablet, atenolol 25 mg tablet, Disp: , Rfl:    atorvastatin (LIPITOR) 20 MG tablet, Take 20 mg by mouth once daily, Disp: , Rfl:    baclofen (LIORESAL) 20 MG tablet, baclofen 20 mg tablet, Disp: , Rfl:    clonazePAM (KLONOPIN) 1 MG tablet, clonazepam 1 mg tablet  TAKE 1 TABLET BY MOUTH AT BEDTIME AS NEEDED, Disp: , Rfl:    FUROsemide (LASIX) 20 MG tablet, , Disp: , Rfl:    hydroCHLOROthiazide (HYDRODIURIL) 25 MG tablet, ,  Disp: , Rfl:    HYDROcodone-acetaminophen (NORCO) 10-325 mg tablet, hydrocodone 10 mg-acetaminophen 325 mg tablet, Disp: , Rfl:    omeprazole (PRILOSEC) 20 MG DR capsule, omeprazole 20 mg capsule,delayed release, Disp: , Rfl:    verapamiL (VERELAN) 240 MG SR capsule, Take by mouth, Disp: , Rfl:  Allergies  Allergen Reactions   Naproxen Swelling   Morphine Itching and Nausea And Vomiting  Review of Systems - Negative except fecal incontinence and frequent UTI's Objective:    Vitals:   12/16/20 1325  Pulse: 82  Temp: 37 C (98.6 F)  SpO2: 96%  Weight: 50.7 kg (111 lb 12.8 oz)  Height: 170.2 cm ('5\' 7"'$ )      General appearance - alert, well appearing, and in no distress Chest - clear to auscultation, no wheezes, rales or rhonchi, symmetric air entry, CTA Heart -  normal rate and regular rhythm Abdomen - soft, nontender, nondistended, no masses or organomegaly   Labs, Imaging and Diagnostic Testing:  Impedance study performed.  3 electrodes are out of range of impedance, fourth electrode causes pain to the patient when activated.  Assessment and Plan:  Diagnoses and all orders for this visit:  Incontinence of feces, unspecified fecal incontinence type  Current sacral nerve stimulator not functioning and unable to be used.  Patient continues to have fecal incontinence.  Patient also with spine issues in need of MRI.  I have suggested reimplantation of electrode and change to an MRI compatible battery to help alleviate her symptoms.  She has had good results with the sacral nerve stimulator in the past and I suspect she will continue to have good results once the lead is functioning again.

## 2020-12-16 NOTE — H&P (Signed)
PROVIDER:  Monico Blitz, MD  MRN: [id number] DOB: Nov 16, 1950 DATE OF ENCOUNTER: 12/16/2020 Subjective   Chief Complaint: fecal incontinence     History of Present Illness:   [name] is a 70 y.o. female who is seen today for worsening fecal incontinence.  This has been occurring for the past several months and is gradually getting worse.  No falls recently.  She has tried multiple programs with the company reps with no change in symptoms.  She was seen in the office recently and an x-ray was obtained.  This showed no change in the lead position.  She is here today for an impedance study. Past Medical History:  Diagnosis Date   Arrhythmia    COPD (chronic obstructive pulmonary disease) (CMS-HCC)    IBS (irritable bowel syndrome)    Past Surgical History:  Procedure Laterality Date   INSERTION / PLACEMENT / REVISION NEUROSTIMULATOR     JOINT REPLACEMENT     SPINE SURGERY     Family History  Problem Relation Age of Onset   Obesity Mother    High blood pressure (Hypertension) Mother    Hyperlipidemia (Elevated cholesterol) Mother    Coronary Artery Disease (Blocked arteries around heart) Mother    Diabetes Mother    Social History   Socioeconomic History   Marital status: Married  Tobacco Use   Smoking status: Former Smoker   Smokeless tobacco: Never Used  Scientific laboratory technician Use: Never used  Substance and Sexual Activity   Alcohol use: Not Currently   Drug use: Never    Current Outpatient Medications:    atenoloL (TENORMIN) 25 MG tablet, atenolol 25 mg tablet, Disp: , Rfl:    atorvastatin (LIPITOR) 20 MG tablet, Take 20 mg by mouth once daily, Disp: , Rfl:    baclofen (LIORESAL) 20 MG tablet, baclofen 20 mg tablet, Disp: , Rfl:    clonazePAM (KLONOPIN) 1 MG tablet, clonazepam 1 mg tablet  TAKE 1 TABLET BY MOUTH AT BEDTIME AS NEEDED, Disp: , Rfl:    FUROsemide (LASIX) 20 MG tablet, , Disp: , Rfl:    hydroCHLOROthiazide (HYDRODIURIL) 25 MG tablet, ,  Disp: , Rfl:    HYDROcodone-acetaminophen (NORCO) 10-325 mg tablet, hydrocodone 10 mg-acetaminophen 325 mg tablet, Disp: , Rfl:    omeprazole (PRILOSEC) 20 MG DR capsule, omeprazole 20 mg capsule,delayed release, Disp: , Rfl:    verapamiL (VERELAN) 240 MG SR capsule, Take by mouth, Disp: , Rfl:  Allergies  Allergen Reactions   Naproxen Swelling   Morphine Itching and Nausea And Vomiting  Review of Systems - Negative except fecal incontinence and frequent UTI's Objective:    Vitals:   12/16/20 1325  Pulse: 82  Temp: 37 C (98.6 F)  SpO2: 96%  Weight: 50.7 kg (111 lb 12.8 oz)  Height: 170.2 cm ('5\' 7"'$ )      General appearance - alert, well appearing, and in no distress Chest - clear to auscultation, no wheezes, rales or rhonchi, symmetric air entry, CTA Heart -  normal rate and regular rhythm Abdomen - soft, nontender, nondistended, no masses or organomegaly   Labs, Imaging and Diagnostic Testing:  Impedance study performed.  3 electrodes are out of range of impedance, fourth electrode causes pain to the patient when activated.  Assessment and Plan:  Diagnoses and all orders for this visit:  Incontinence of feces, unspecified fecal incontinence type  Current sacral nerve stimulator not functioning and unable to be used.  Patient continues to have fecal incontinence.  Patient also with spine issues in need of MRI.  I have suggested reimplantation of electrode and change to an MRI compatible battery to help alleviate her symptoms.  She has had good results with the sacral nerve stimulator in the past and I suspect she will continue to have good results once the lead is functioning again.

## 2020-12-19 ENCOUNTER — Other Ambulatory Visit: Payer: Self-pay

## 2020-12-19 ENCOUNTER — Ambulatory Visit (INDEPENDENT_AMBULATORY_CARE_PROVIDER_SITE_OTHER)
Admission: RE | Admit: 2020-12-19 | Discharge: 2020-12-19 | Disposition: A | Discharge status: Home / Self Care | Payer: Medicare Other | Source: Ambulatory Visit | Attending: Internal Medicine | Admitting: Internal Medicine

## 2020-12-19 DIAGNOSIS — Z87891 Personal history of nicotine dependence: Secondary | ICD-10-CM | POA: Diagnosis not present

## 2020-12-19 DIAGNOSIS — F17210 Nicotine dependence, cigarettes, uncomplicated: Secondary | ICD-10-CM

## 2021-01-01 NOTE — Progress Notes (Signed)
Please call patient and let them  know their  low dose Ct was read as a Lung RADS 2: nodules that are benign in appearance and behavior with a very low likelihood of becoming a clinically active cancer due to size or lack of growth. Recommendation per radiology is for a repeat LDCT in 12 months. .Please let them  know we will order and schedule their  annual screening scan for 11/2021. Please let them  know there was notation of CAD on their  scan.  Please remind the patient  that this is a non-gated exam therefore degree or severity of disease  cannot be determined. Please have them  follow up with their PCP regarding potential risk factor modification, dietary therapy or pharmacologic therapy if clinically indicated. Pt.  is not currently on statin therapy. Please place order for annual  screening scan for  11/2021 and fax results to PCP. Thanks so much.

## 2021-01-02 ENCOUNTER — Encounter: Payer: Self-pay | Admitting: *Deleted

## 2021-01-02 DIAGNOSIS — Z87891 Personal history of nicotine dependence: Secondary | ICD-10-CM

## 2021-01-06 ENCOUNTER — Encounter (HOSPITAL_BASED_OUTPATIENT_CLINIC_OR_DEPARTMENT_OTHER): Payer: Self-pay | Admitting: General Surgery

## 2021-01-06 ENCOUNTER — Other Ambulatory Visit: Payer: Self-pay

## 2021-01-06 NOTE — Progress Notes (Signed)
Spoke w/ via phone for pre-op interview--- [name] needs dos----   ISTAT            Lab results------ COVID test -----patient states asymptomatic no test needed Arrive at ------- 1130 NPO after MN NO Solid Food.  Clear liquids from MN until--- 0830 Med rec completed Medications to take morning of surgery ----- Atenolol and Prilosec Diabetic medication ----- Patient instructed no nail polish to be worn day of surgery Patient instructed to bring photo id and insurance card day of surgery Patient aware to have Driver (ride ) / caregiver    for 24 hours after surgery  Patient Special Instructions ----- Pre-Op special Istructions ----- Patient verbalized understanding of instructions that were given at this phone interview. Patient denies shortness of breath, chest pain, fever, cough at this phone interview. Anesthesia Review:  PCP: Dr. Bea Graff Cardiologist : Dr. Claiborne Billings Chest x-ray : EKG : 10/2020 Echo : 2017 Stress test: Cardiac Cath :  Activity level:  Sleep Study/ CPAP : Fasting Blood Sugar :      / Checks Blood Sugar -- times a day:   Blood Thinner/ Instructions /Last Dose: ASA / Instructions/ Last Dose :

## 2021-01-09 ENCOUNTER — Ambulatory Visit (HOSPITAL_BASED_OUTPATIENT_CLINIC_OR_DEPARTMENT_OTHER)
Admission: RE | Admit: 2021-01-09 | Discharge: 2021-01-09 | Disposition: A | Discharge status: Home / Self Care | Payer: Medicare Other | Source: Ambulatory Visit | Attending: General Surgery | Admitting: General Surgery

## 2021-01-09 ENCOUNTER — Encounter (HOSPITAL_BASED_OUTPATIENT_CLINIC_OR_DEPARTMENT_OTHER): Payer: Self-pay | Admitting: General Surgery

## 2021-01-09 ENCOUNTER — Ambulatory Visit (HOSPITAL_BASED_OUTPATIENT_CLINIC_OR_DEPARTMENT_OTHER): Payer: Medicare Other | Admitting: Anesthesiology

## 2021-01-09 ENCOUNTER — Encounter (HOSPITAL_BASED_OUTPATIENT_CLINIC_OR_DEPARTMENT_OTHER)
Admission: RE | Disposition: A | Discharge status: Home / Self Care | Payer: Self-pay | Source: Ambulatory Visit | Attending: General Surgery

## 2021-01-09 ENCOUNTER — Ambulatory Visit (HOSPITAL_COMMUNITY): Payer: Medicare Other

## 2021-01-09 ENCOUNTER — Other Ambulatory Visit: Payer: Self-pay

## 2021-01-09 DIAGNOSIS — T85615A Breakdown (mechanical) of other nervous system device, implant or graft, initial encounter: Secondary | ICD-10-CM | POA: Insufficient documentation

## 2021-01-09 DIAGNOSIS — R159 Full incontinence of feces: Secondary | ICD-10-CM | POA: Diagnosis present

## 2021-01-09 DIAGNOSIS — Y838 Other surgical procedures as the cause of abnormal reaction of the patient, or of later complication, without mention of misadventure at the time of the procedure: Secondary | ICD-10-CM | POA: Diagnosis not present

## 2021-01-09 LAB — POCT I-STAT, CHEM 8
BUN: 24 mg/dL — ABNORMAL HIGH (ref 8–23)
Calcium, Ion: 1.28 mmol/L (ref 1.15–1.40)
Chloride: 104 mmol/L (ref 98–111)
Creatinine, Ser: 0.7 mg/dL (ref 0.44–1.00)
Glucose, Bld: 90 mg/dL (ref 70–99)
HCT: 44 % (ref 36.0–46.0)
Hemoglobin: 15 g/dL (ref 12.0–15.0)
Potassium: 3.9 mmol/L (ref 3.5–5.1)
Sodium: 143 mmol/L (ref 135–145)
TCO2: 29 mmol/L (ref 22–32)

## 2021-01-09 SURGERY — INSERTION, NEUROSTIMULATOR, SACRAL
Anesthesia: Monitor Anesthesia Care

## 2021-01-09 MED ORDER — OXYCODONE HCL 5 MG PO TABS
5.0000 mg | ORAL_TABLET | Freq: Once | ORAL | Status: AC | PRN
Start: 2021-01-09 — End: 2021-01-09
  Administered 2021-01-09: 5 mg via ORAL

## 2021-01-09 MED ORDER — MIDAZOLAM HCL 5 MG/5ML IJ SOLN
INTRAMUSCULAR | Status: DC | PRN
  Administered 2021-01-09: 2 mg via INTRAVENOUS

## 2021-01-09 MED ORDER — PROPOFOL 500 MG/50ML IV EMUL
INTRAVENOUS | Status: AC
  Filled 2021-01-09: qty 50

## 2021-01-09 MED ORDER — FENTANYL CITRATE (PF) 250 MCG/5ML IJ SOLN
INTRAMUSCULAR | Status: DC | PRN
  Administered 2021-01-09 (×2): 25 ug via INTRAVENOUS

## 2021-01-09 MED ORDER — MEPERIDINE HCL 25 MG/ML IJ SOLN: 6.2500 mg | INTRAMUSCULAR | Status: DC | PRN

## 2021-01-09 MED ORDER — LIDOCAINE 2% (20 MG/ML) 5 ML SYRINGE
INTRAMUSCULAR | Status: DC | PRN
  Administered 2021-01-09: 100 mg via INTRAVENOUS

## 2021-01-09 MED ORDER — FENTANYL CITRATE (PF) 100 MCG/2ML IJ SOLN
INTRAMUSCULAR | Status: AC
  Filled 2021-01-09: qty 2

## 2021-01-09 MED ORDER — PROMETHAZINE HCL 25 MG/ML IJ SOLN: 6.2500 mg | INTRAMUSCULAR | Status: DC | PRN

## 2021-01-09 MED ORDER — BUPIVACAINE-EPINEPHRINE 0.5% -1:200000 IJ SOLN
INTRAMUSCULAR | Status: DC | PRN
  Administered 2021-01-09: 20 mL

## 2021-01-09 MED ORDER — LACTATED RINGERS IV SOLN: INTRAVENOUS | Status: DC

## 2021-01-09 MED ORDER — OXYCODONE HCL 5 MG PO TABS
ORAL_TABLET | ORAL | Status: AC
  Filled 2021-01-09: qty 1

## 2021-01-09 MED ORDER — MIDAZOLAM HCL 2 MG/2ML IJ SOLN
INTRAMUSCULAR | Status: AC
  Filled 2021-01-09: qty 2

## 2021-01-09 MED ORDER — OXYCODONE HCL 5 MG/5ML PO SOLN: 5.0000 mg | Freq: Once | ORAL | Status: AC | PRN

## 2021-01-09 MED ORDER — PROPOFOL 500 MG/50ML IV EMUL
INTRAVENOUS | Status: DC | PRN
  Administered 2021-01-09: 100 ug/kg/min via INTRAVENOUS

## 2021-01-09 MED ORDER — LIDOCAINE HCL (PF) 2 % IJ SOLN
INTRAMUSCULAR | Status: AC
  Filled 2021-01-09: qty 5

## 2021-01-09 MED ORDER — TRAMADOL HCL 50 MG PO TABS: 50.0000 mg | ORAL_TABLET | Freq: Four times a day (QID) | ORAL | 0 refills | Status: DC | PRN

## 2021-01-09 MED ORDER — FENTANYL CITRATE (PF) 100 MCG/2ML IJ SOLN: 25.0000 ug | INTRAMUSCULAR | Status: DC | PRN

## 2021-01-09 MED ORDER — CEFAZOLIN SODIUM-DEXTROSE 2-4 GM/100ML-% IV SOLN
2.0000 g | INTRAVENOUS | Status: AC
  Administered 2021-01-09: 2 g via INTRAVENOUS

## 2021-01-09 MED ORDER — CEFAZOLIN SODIUM-DEXTROSE 2-4 GM/100ML-% IV SOLN
INTRAVENOUS | Status: AC
  Filled 2021-01-09: qty 100

## 2021-01-09 MED ORDER — PROPOFOL 10 MG/ML IV BOLUS
INTRAVENOUS | Status: DC | PRN
  Administered 2021-01-09: 20 mg via INTRAVENOUS
  Administered 2021-01-09 (×2): 30 mg via INTRAVENOUS

## 2021-01-09 SURGICAL SUPPLY — 48 items
ADH SKN CLS APL DERMABOND .7 (GAUZE/BANDAGES/DRESSINGS) ×1
APL PRP STRL LF DISP 70% ISPRP (MISCELLANEOUS) ×1
BLADE SURG 15 STRL LF DISP TIS (BLADE) ×1 IMPLANT
BLADE SURG 15 STRL SS (BLADE) ×2
CABLE TEST STIMULATION (UROLOGICAL SUPPLIES) IMPLANT
CABLE TWIST LOCK 25CM (UROLOGICAL SUPPLIES) IMPLANT
CHLORAPREP W/TINT 26 (MISCELLANEOUS) ×2 IMPLANT
COVER BACK TABLE 60X90IN (DRAPES) ×2 IMPLANT
COVER MAYO STAND STRL (DRAPES) ×2 IMPLANT
COVER PROBE U/S 5X48 (MISCELLANEOUS) IMPLANT
DERMABOND ADVANCED (GAUZE/BANDAGES/DRESSINGS) ×1
DERMABOND ADVANCED .7 DNX12 (GAUZE/BANDAGES/DRESSINGS) ×1 IMPLANT
DRAPE C-ARM 42X120 X-RAY (DRAPES) ×2 IMPLANT
DRAPE INCISE IOBAN 66X45 STRL (DRAPES) ×2 IMPLANT
DRAPE LAPAROSCOPIC ABDOMINAL (DRAPES) ×2 IMPLANT
DRAPE SHEET LG 3/4 BI-LAMINATE (DRAPES) ×2 IMPLANT
DRAPE UTILITY XL STRL (DRAPES) ×2 IMPLANT
DRSG TEGADERM 4X4.75 (GAUZE/BANDAGES/DRESSINGS) ×2 IMPLANT
ELECT REM PT RETURN 9FT ADLT (ELECTROSURGICAL) ×2
ELECTRODE REM PT RTRN 9FT ADLT (ELECTROSURGICAL) ×1 IMPLANT
ENVELOPE ABSORB ANTIBACTERIAL (Mesh General) ×2 IMPLANT
GAUZE 4X4 16PLY ~~LOC~~+RFID DBL (SPONGE) ×2 IMPLANT
GAUZE SPONGE 4X4 12PLY STRL (GAUZE/BANDAGES/DRESSINGS) IMPLANT
GLOVE SURG ENC MOIS LTX SZ6.5 (GLOVE) ×2 IMPLANT
GLOVE SURG UNDER LTX SZ6.5 (GLOVE) ×2 IMPLANT
GOWN STRL REUS W/TWL XL LVL3 (GOWN DISPOSABLE) ×2 IMPLANT
INTRODUCER GUIDE DILATR SHEATH (SET/KITS/TRAYS/PACK) IMPLANT
KIT HANDSET INTERSTIM COMM (NEUROSURGERY SUPPLIES) ×2 IMPLANT
KIT TURNOVER CYSTO (KITS) ×2 IMPLANT
LEAD INTERSTIM 4.32 28 L (Lead) ×2 IMPLANT
NEEDLE HYPO 22GX1.5 SAFETY (NEEDLE) ×2 IMPLANT
NEUROSTIMULATOR 1.7X2X.06 (UROLOGICAL SUPPLIES) ×2 IMPLANT
PACK BASIN DAY SURGERY FS (CUSTOM PROCEDURE TRAY) ×2 IMPLANT
PAD ARMBOARD 7.5X6 YLW CONV (MISCELLANEOUS) ×2 IMPLANT
PENCIL SMOKE EVACUATOR (MISCELLANEOUS) ×2 IMPLANT
PROGRAMMER SMART TH90G01 (UROLOGICAL SUPPLIES) IMPLANT
STIMULATOR INTERSTIM 2X1.7X.3 (Miscellaneous) ×2 IMPLANT
STRIP CLOSURE SKIN 1/2X4 (GAUZE/BANDAGES/DRESSINGS) IMPLANT
SUT SILK 2 0 TIES 17X18 (SUTURE)
SUT SILK 2-0 18XBRD TIE BLK (SUTURE) IMPLANT
SUT VIC AB 3-0 SH 27 (SUTURE) ×2
SUT VIC AB 3-0 SH 27X BRD (SUTURE) ×1 IMPLANT
SUT VIC AB 4-0 PS2 18 (SUTURE) ×2 IMPLANT
SYR BULB IRRIG 60ML STRL (SYRINGE) ×2 IMPLANT
SYR CONTROL 10ML LL (SYRINGE) ×2 IMPLANT
TOWEL OR 17X26 10 PK STRL BLUE (TOWEL DISPOSABLE) ×2 IMPLANT
TUBE CONNECTING 12X1/4 (SUCTIONS) ×2 IMPLANT
WATER STERILE IRR 500ML POUR (IV SOLUTION) ×2 IMPLANT

## 2021-01-09 NOTE — Anesthesia Preprocedure Evaluation (Signed)
Anesthesia Evaluation  Patient identified by MRN, date of birth, ID band Patient awake    Reviewed: Allergy & Precautions, H&P , NPO status , Patient's Chart, lab work & pertinent test results, reviewed documented beta blocker date and time   History of Anesthesia Complications (+) PONV and history of anesthetic complications  Airway Mallampati: II  TM Distance: >3 FB Neck ROM: full    Dental  (+) Lower Dentures, Upper Dentures, Dental Advisory Given   Pulmonary COPD, Current Smoker, former smoker,    Pulmonary exam normal breath sounds clear to auscultation       Cardiovascular hypertension, Pt. on home beta blockers + Peripheral Vascular Disease  + dysrhythmias + Valvular Problems/Murmurs  Rhythm:regular Rate:Normal  Stress test 09/27/2020 The left ventricular ejection fraction is hyperdynamic (>65%). Nuclear stress EF: 69%. There was no ST segment deviation noted during stress. No T wave inversion was noted during stress. The study is normal. This is a low risk study.   1. Apical thinning artifact present.  2. Normal study without evidence of ischemia or infarction.  3. Normal LVEF, >65%.  4. This is a low-risk study.    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 Chronic back pain TIA   GI/Hepatic Fecal Incontinence   Endo/Other    Renal/GU      Musculoskeletal  (+) Arthritis , Osteoarthritis,    Abdominal   Peds  Hematology  (+) anemia ,   Anesthesia Other Findings   Reproductive/Obstetrics                             Anesthesia Physical  Anesthesia Plan  ASA: 3  Anesthesia Plan: MAC   Post-op Pain Management:    Induction: Intravenous  PONV Risk Score and Plan: 3 and Ondansetron, Propofol infusion, TIVA, Midazolam and Treatment may vary due to age or medical condition  Airway Management Planned: Natural  Airway  Additional Equipment: None  Intra-op Plan:   Post-operative Plan:   Informed Consent: I have reviewed the patients History and Physical, chart, labs and discussed the procedure including the risks, benefits and alternatives for the proposed anesthesia with the patient or authorized representative who has indicated his/her understanding and acceptance.     Dental advisory given  Plan Discussed with: CRNA  Anesthesia Plan Comments:         Anesthesia Quick Evaluation

## 2021-01-09 NOTE — Transfer of Care (Signed)
Immediate Anesthesia Transfer of Care Note  Patient: [name]  Procedure(s) Performed: SACRAL NERVE STIMULATOR REIMPLANTATION  Patient Location: PACU  Anesthesia Type:MAC  Level of Consciousness: awake, alert  and oriented  Airway & Oxygen Therapy: Patient Spontanous Breathing and Patient connected to nasal cannula oxygen  Post-op Assessment: Report given to RN  Post vital signs: Reviewed and stable  Last Vitals:  Vitals Value Taken Time  BP 122/56 01/09/21 1511  Temp    Pulse 69 01/09/21 1513  Resp 17 01/09/21 1513  SpO2 100 % 01/09/21 1513  Vitals shown include unvalidated device data.  Last Pain:  Vitals:   01/09/21 1135  TempSrc: Oral  PainSc: 3          Complications: No notable events documented.

## 2021-01-09 NOTE — Interval H&P Note (Signed)
History and Physical Interval Note:  01/09/2021 12:16 PM  [name]  has presented today for surgery, with the diagnosis of fecal incontinence.  The various methods of treatment have been discussed with the patient and family. After consideration of risks, benefits and other options for treatment, the patient has consented to  Procedure(s): SACRAL NERVE STIMULATOR REIMPLANTATION (N/A) as a surgical intervention.  The patient's history has been reviewed, patient examined, no change in status, stable for surgery.  I have reviewed the patient's chart and labs.  Questions were answered to the patient's satisfaction.     Rosario Adie, MD  [facility]

## 2021-01-09 NOTE — Anesthesia Procedure Notes (Addendum)
Date/Time: 01/09/2021 1:25 PM Performed by: Bonney Aid, CRNA Pre-anesthesia Checklist: Patient identified, Emergency Drugs available, Suction available, Patient being monitored and Timeout performed Patient Re-evaluated:Patient Re-evaluated prior to induction Oxygen Delivery Method: Nasal cannula Placement Confirmation: positive ETCO2

## 2021-01-09 NOTE — Op Note (Signed)
01/09/2021  3:07 PM  PATIENT:  [name]  70 y.o. female  Patient Care Team: Raina Mina., MD as PCP - General (Internal Medicine) Troy Sine, MD as PCP - Cardiology (Cardiology) Renette Butters, MD as Attending Physician (Orthopedic Surgery)  PRE-OPERATIVE DIAGNOSIS:  Fecal Incontinence  POST-OPERATIVE DIAGNOSIS:  Fecal Incontinence  PROCEDURE:  SACRAL NERVE STIMULATOR REIMPLANTATION  Surgeon(s): Leighton Ruff, MD  ASSISTANT: none   ANESTHESIA:   local and MAC  EBL: 46m  Total I/O In: 1100 [I.V.:1000; IV Piggyback:100] Out: -   DRAINS: none   SPECIMEN:  No Specimen  DISPOSITION OF SPECIMEN:  N/A  COUNTS:  YES  PLAN OF CARE: Discharge to home after PACU  PATIENT DISPOSITION:  PACU - hemodynamically stable.  INDICATION: Fecal incontinence.  The patient has had a sacral nerve stimulator that has functioned well for several years.  Over the last 6 to 12 months she has noticed a decrease in function.  This was tested in the office and her impedance values were not acceptable.  X-rays show good placement of the leads with no change from implantation.  It was recommended that she undergo reimplantation of her sacral nerve stimulator wire.   OR FINDINGS: Wire lead intact.  Impedence values < 1000.  DESCRIPTION: The patient was identified in the preoperative holding area and taken to the OR where they were laid prone on the operating room table.  MAC anesthesia was induced without difficulty. SCDs were also noted to be in place prior to the initiation of anesthesia.  Pillows were placed under lower abdomen to flatten the sacrum and under shins to allow the toes to dangle freely. A ground pad was placed on the bottom of the patient's foot and the proximal ends of the j-hook patient cable were connected to the ground pad and the external neurostimulator (ENS).The patient was then prepped and draped in the usual sterile fashion.  A surgical timeout was performed  indicating the correct patient, procedure, positioning and need for preoperative antibiotics.  I began by cutting down over the previous battery site on the left buttock using a scalpel.  I dissected through the subcutaneous tissues until the pocket of the neurostimulator battery was identified.  This was incised and the battery was removed.  The lead was cut from the battery. The c-arm was then moved into AP position to provide fluoroscopic guidance of the sacrum. The medial edges of the foramina were identified and marked. The c-arm was then moved into the lateral position to identify the S3 foramen. Once the needle entry point was determined, local injection of Marcaine with epinephrine was administered bilaterally. A foramen needle was placed in the superior, medial aspect of the left S3 foramen and appropriate needle depth was visualized utilizing fluoroscopy. Proper S3 needle location was also confirmed by direct observation of the lifting of the perineum or "bellowing," and plantar flexion of the great toe utilizing the j-hook patient cable, the external neurostimulator and Verify controller.  We were unable to get the medial location of the needle that we were looking for.  We then turned to the right side.  A cutdown was made over the implanted lead.  Dissection was carried down to the level of the sacrum using a hemostat.  The tines were identified and removed with the entire wire from the S3 foramen.  Once this was removed, a foramen needle was placed in the superior medial aspect of the right S3 foramen and appropriate needle depth  was visualized using fluoroscopy.  We were unable to confirm good placement by direct observation of the lifting of the peritoneum or bellowing and plantar flexion of the great toe with the external neurostimulator controller.  We tried to reposition several times but were unable to get good responses.  We turned back to the left side.  I brought my foramen and needle out  more laterally and reintroduced this in a straighter angle.  We were then able to confirm proper S3 needle location by direct observation of the lifting of the peritoneum or bellowing and plantar flexion of the great toe utilizing the J hook patient cable, the external neurostimulator and verify controller.  We decided to proceed with reimplantation on the left side. The foramen needle stylet was removed and a directional guide was placed through the needle using markers on the guide to assure appropriate depth. The foramen needle was removed by sliding over the directional guide. A small incision was made peripherally to the directional guide through the skin. The lead introducer with dilator was placed over the directional guide and utilizing fluoroscopic guidance, the lead introducer was advanced until the radiopaque mark was half-way through the foramen. The dilator was removed along with the directional guide. Using fluoroscopy, the tined lead with bent stylet was placed through the introducer until electrodes 3 and 4 straddled the anterior surface of the sacrum. All four electrodes were tested, observing "bellows" and plantar flexion of the great toe utilizing the j-hook patient cable and the external neurostimulator with Verify controller. After satisfactory lead positioning was confirmed, the introducer was retracted over the lead under continuous fluoroscopy, deploying the tines into presacral tissue. Retesting of all four electrodes confirming appropriate responses was completed.   At this point the lead was tunneled to the previous pocket on the patient's left gluteus.  The lead was brought through and was inserted into the header of the InterStim neurostimulator MRI compatible battery until the blue tip was visualized at the distal window. The single set screw was tightened using the torque wrench until audible click was heard.  The neurostimulator was placed in a Tyrex antibacterial pouch and then  the entire device was placed into the pocket with the etched identification side placed upwards and any excessive lead was placed around the neurostimulator. The clinician programmer telemetry head, covered in a sterile sleeve, was placed over the implanted neurostimulator to ensure proper lead connection and that parameters were within normal range. Impedances were confirmed to be within normal limits, greater than 50 and less than 4,000 ohms. The wound was closed with a running 3-0 Vicryl subcuticular suture and a 4-0 Vicryl running skin suture. Counts were correct. Dermabond was placed over the incision. The patient was transferred to the PACU in satisfactory condition. Using the clinician programmer, the INS was programmed.   Patient was provided utilization instructions for the patient programmer prior to discharge.

## 2021-01-09 NOTE — Discharge Instructions (Addendum)
POST OP INSTRUCTIONS  Always review your discharge instruction sheet given to you by the facility where your surgery was performed.   A prescription for pain medication may be given to you upon discharge. Take your pain medication as prescribed, if needed. If narcotic pain medicine is not needed, then you make take acetaminophen (Tylenol) or ibuprofen (Advil) as needed.  Take your usually prescribed medications unless otherwise directed. If you need a refill on your pain medication, please contact our office. All narcotic pain medicine now requires a paper prescription.  Phoned in and fax refills are no longer allowed by law.  Prescriptions will not be filled after 5 pm or on weekends.  You should follow a light diet for the remainder of the day after your procedure. Most patients will experience some mild swelling and/or bruising in the area of the incision. It may take several days to resolve. It is common to experience some constipation if taking pain medication after surgery. Increasing fluid intake and taking a stool softener (such as Colace) will usually help or prevent this problem from occurring. A mild laxative (Milk of Magnesia or Miralax) should be taken according to package directions if there are no bowel movements after 48 hours.  Your surgeon used Dermabond (skin glue) on the incision, you may shower in 24 hours.  The glue will flake off over the next 2-3 weeks.    ACTIVITIES:  Limit activity involving your arms for the next 72 hours. Do no strenuous exercise or activity for 1 week. You may drive when you are no longer taking prescription pain medication, you can comfortably wear a seatbelt, and you can maneuver your car. 10.You may need to see your doctor in the office for a follow-up appointment.  Please check with your doctor.    WHEN TO CALL YOUR DOCTOR [phone]): Fever over 101.0 Chills Continued bleeding from incision Increased redness and tenderness at the  site Shortness of breath, difficulty breathing  The clinic staff is available to answer your questions during regular business hours. Please don't hesitate to call and ask to speak to one of the nurses or medical assistants for clinical concerns. If you have a medical emergency, go to the nearest emergency room or call 911.  A surgeon from [facility] is always on call at the hospital.    For further information, please visit [email or web address]  Post Anesthesia Home Care Instructions  Activity: Get plenty of rest for the remainder of the day. A responsible individual must stay with you for 24 hours following the procedure.  For the next 24 hours, DO NOT: -Drive a car -Paediatric nurse -Drink alcoholic beverages -Take any medication unless instructed by your physician -Make any legal decisions or sign important papers.  Meals: Start with liquid foods such as gelatin or soup. Progress to regular foods as tolerated. Avoid greasy, spicy, heavy foods. If nausea and/or vomiting occur, drink only clear liquids until the nausea and/or vomiting subsides. Call your physician if vomiting continues.  Special Instructions/Symptoms: Your throat may feel dry or sore from the anesthesia or the breathing tube placed in your throat during surgery. If this causes discomfort, gargle with warm salt water. The discomfort should disappear within 24 hours.

## 2021-01-10 NOTE — Anesthesia Postprocedure Evaluation (Signed)
Anesthesia Post Note  Patient: [name]  Procedure(s) Performed: [place] REIMPLANTATION     Patient location during evaluation: PACU Anesthesia Type: MAC Level of consciousness: awake and alert Pain management: pain level controlled Vital Signs Assessment: post-procedure vital signs reviewed and stable Respiratory status: spontaneous breathing Cardiovascular status: stable Anesthetic complications: no   No notable events documented.  Last Vitals:  Vitals:   01/09/21 1545 01/09/21 1634  BP: 115/80 117/70  Pulse: (!) 55 61  Resp: 13 15  Temp:  36.4 C  SpO2: 97% 100%    Last Pain:  Vitals:   01/09/21 1634  TempSrc:   PainSc: [place]

## 2021-03-25 ENCOUNTER — Ambulatory Visit: Payer: Medicare Other | Admitting: Cardiovascular Disease

## 2021-03-25 ENCOUNTER — Other Ambulatory Visit: Payer: Self-pay

## 2021-03-25 ENCOUNTER — Encounter: Payer: Self-pay | Admitting: Cardiovascular Disease

## 2021-03-25 DIAGNOSIS — I059 Rheumatic mitral valve disease, unspecified: Secondary | ICD-10-CM

## 2021-03-25 DIAGNOSIS — I700 Atherosclerosis of aorta: Secondary | ICD-10-CM

## 2021-03-25 DIAGNOSIS — Z87891 Personal history of nicotine dependence: Secondary | ICD-10-CM

## 2021-03-25 DIAGNOSIS — E785 Hyperlipidemia, unspecified: Secondary | ICD-10-CM | POA: Diagnosis not present

## 2021-03-25 DIAGNOSIS — J432 Centrilobular emphysema: Secondary | ICD-10-CM

## 2021-03-25 DIAGNOSIS — M7989 Other specified soft tissue disorders: Secondary | ICD-10-CM | POA: Diagnosis not present

## 2021-03-25 MED ORDER — ATENOLOL 25 MG PO TABS: ORAL_TABLET | ORAL | 2 refills | Status: DC

## 2021-03-25 NOTE — Patient Instructions (Signed)
Medication Instructions:  Your Physician recommend you continue on your current medication as directed.    *If you need a refill on your cardiac medications before your next appointment, please call your pharmacy*   Lab Work: None ordered today   Testing/Procedures: None ordered today   Follow-Up: At [facility], you and your health needs are our priority.  As part of our continuing mission to provide you with exceptional heart care, we have created designated Provider Care Teams.  These Care Teams include your primary Cardiologist (physician) and Advanced Practice Providers (APPs -  Physician Assistants and Nurse Practitioners) who all work together to provide you with the care you need, when you need it.  We recommend signing up for the patient portal called "MyChart".  Sign up information is provided on this After Visit Summary.  MyChart is used to connect with patients for Virtual Visits (Telemedicine).  Patients are able to view lab/test results, encounter notes, upcoming appointments, etc.  Non-urgent messages can be sent to your provider as well.   To learn more about what you can do with MyChart, go to [email or web address].    Your next appointment:   1 year(s)  The format for your next appointment:   In Person  Provider:   Thomas Kelly, MD       

## 2021-03-25 NOTE — Progress Notes (Signed)
Patient ID: [name], female   DOB: 1951-02-17, 70 y.o.   MRN: [id number]    PCP: Dr. Gilford Rile  HPI: [name] is a 70 y.o. female resents to the office today for a 4 month followup cardiology evaluation.   [name] has a history of supraventricular tachycardia which has been fairly well-controlled on combination verapamil SR  plus beta blocker therapy with atenolol.  She has a history of underlying mitral valve prolapse, mild carotid disease, hyperlipidemia, as well as mild COPD. She had smoked for over 30 years but quit smoking.  She was hospitalized after being bitten by a copperhead snake and required 5 days in the hospital. Apparently her cardiac medicines were held at that time and during the hospitalization she had recurrent episodes of tachycardia dysrhythmia. As long as she takes her medicines, and she feels that her heart rhythm is controlled. There is rare intermittent palpitations.typically, these episodes may last for under a minute and typically resolve with the Valsalva maneuver if they don't resolve spontaneously.  When I saw her in 2017 she was unaware of any breakthrough arrhythmias.  At that time her blood pressure was low and there was mild orthostatic drop.  Her resting pulse was 60.  I recommended that she continue to take Verapamil SR 240 mg but I reduced her atenolol dose to 12.5  mg twice a day.  She underwent a follow-up echo Doppler study on 10/25/2015.  This showed an EF of 60-65%.  There was normal wall motion.  There was no significant valvular pathology.  She was given preoperative clearance to undergo hip surgery by Dr. Fredonia [name] which was done in July 2017 and she tolerated this well.  I last saw her, she had undergone  a panoramic dental x-ray.  Incidentally, she was noted to have calcified carotid atheromas of the internal carotid artery suggestive of atherosclerotic disease.  I scheduled her for carotid duplex imaging which showed heterogeneous plaque  bilaterally with narrowing in the 1-39% range.  She had normal subclavian arteries, and patent vertebral arteries with antegrade flow.  She resumed smoking after being off cigarettes for 2 years.  She resumed smoking after a death of a family member.    I saw her in May 2019 after not having seen her in over a year., she denies any episodes of chest pain or awareness of palpitations.  She was concerned about her weight loss.  She had resumed smoking but quit 1 month previous to her last evaluation.  Her smoking duration has been over 40 years.  She admits to exertional dyspnea.  She denies any prolonged cough.  In July 2018, LDL cholesterol was 78 on atorvastatin and only 5 mg.  She has continued to be on atenolol 12.5 mg twice a day and verapamil 240 mg daily.  She is on aspirin.  In December 2018 carotid duplex imaging showed mild bilateral carotid plaque which was not hemodynamically significant.  Due to weight loss I referred her to Sara Form, NP of pulmonary so that she would qualify for a low-dose CT of her chest to evaluate for possible lung CA screening protocol.  This apparently did not show any cancer but there was aortic atherosclerosis in addition to left main and two-vessel CAD noted on the CT scan.  She had mild diffuse bronchial wall thickening with mild centrilobular and paraseptal emphysema, with imaging findings suggestive of underlying COPD.  When I saw her in January 27, 2019 she previously had  an episode  of food poisoning associated with dehydration and small bowel obstruction leading to a hospitalization.  She admits to weight loss.  She denied any anginal type symptoms.  She was continuing to smoke cigarettes.  She is now set up to undergo yearly lung radiation CT of her chest for lung CA screening.Marland Kitchen  She was evaluated by me in October 2021.  Since her prior evaluation she continued to feel well.  She was still smoking  smoking and her smoking has been reduced to 5 cigarettes/day.  She  had undergone laboratory by Dr. Cheron [name] in April 2021 which showed a total cholesterol of 129, LDL 74 HDL 20 triglycerides 68.  She walks 2 miles per day typically drinks 5 to 7 days/week.  Back in December 19, 2019 she underwent a yearly follow-up CT of her chest was cancer screening.  This essentially was unchanged and showed benign appearance.  She again was noted to have her centrilobular emphysema.  There was aortic atherosclerosis as well as coronary artery atherosclerosis.    I saw her on Sep 19, 2020 and since her prior evaluation she had remained stable without chest pain but admitted to shortness of breath with activity.  She has documented centrilobular emphysema.  She has been trying to walk up to 2 miles per day.  Over the prior 3 weeks she noticed development of lower extremity bilateral edema left greater than right which is pitting.  She was still smoking approximately 4 cigarettes/day. She had undergone laboratory by her primary physician.  Several months ago her atorvastatin was increased to 20 mg.  LDL on her increased atorvastatin on August 15, 2020 was 88 with total cholesterol 131, HDL 36 and triglycerides 79.  Chemistry was normal.  With her documented coronary calcification and aortic atherosclerosis I recommended more aggressive lipid management with target LDL in the 50s and 60s if at all possible.  As result atorvastatin was increased to 40 mg daily.  She was experiencing some leg edema and initiated HCTZ 12.5 mg daily and if after 1 week edema was still present she could increase this to 25 mg as needed and go back to 12.5 mg if resolved.  I saw her in follow-up on November 20, 2020.  Since I last saw her, she was successful in smoking cessation and quit tobacco the day after her office visit on Sep 20, 2020.  She has continued to experience pretibial edema despite taking HCTZ at 25 mg.  She denies chest pain.  She is tolerating rosuvastatin.  She underwent a Lexiscan Myoview study which was  low risk and revealed hyperdynamic LV function with EF greater than 65% and no evidence for scar or ischemia.  With her continued leg edema despite taking HCTZ, I recommended she discontinue hydrochlorothiazide and initiated furosemide 20 mg daily.  She continues to be on verapamil 2040 mg and atenolol 12.5 mg twice a day and her blood pressure was stable.  Since her last evaluation, she has continued to be smoke-free.  Her swelling has significantly improved with furosemide but at times she may note trace ankle to pretibial edema.  She is now walking 1 to 2 miles per day.  She denies chest pain or palpitations.  She presents for reevaluation.  Past Medical History:  Diagnosis Date   Anxiety    Back pain, chronic    Bowel obstruction (HCC)    Complication of anesthesia    PT HAS POST OP URINARY RETENTION:  per patient "difficult stick-  needs central line or picc"  04-06-2017 AT [facility] IV STARTED BY CRNA NO ISSUE   COPD with emphysema ([place])    DDD (degenerative disc disease), cervical    POST MULTIPLE SURGERY'S   DDD (degenerative disc disease), lumbosacral    POST MULTIPLE SURGERY'S   Difficult intravenous access    Fecal incontinence    once a week----  TREATED W/ SACRAL NERVE STIMULATOR PLACED 12/ 2018   Heart murmur    History of chronic bronchitis    History of spinal cord injury 02/09/2007   PT FELL-- S/P  CERVICAL SPINE SURGERY   History of urinary retention    PT HAS POST OP URINARY RETENTION DUE TO ANESTHESIA   History of venomous snake bite 2013   RIGHT MIDDLE FINGER BY COOPERHEAD--- TREATMENT W/ ANTIVENOM   Hyperlipemia    IBS (irritable bowel syndrome)    flairs up occasionally esp if stressed   Incomplete right bundle branch block    Major depression    Mild carotid artery disease [facility]) cardiologist-  dr Claiborne Billings   mild bilateral ICA per duplex 04-12-2017  1-39%   MVP (mitral valve prolapse)    no evidence stenosis or regurgitation per last echo 10-25-2015   OA  (osteoarthritis)    PONV (postoperative nausea and vomiting)    Pre-diabetes    PSVT (paroxysmal supraventricular tachycardia) [facility])    cardiologist-- dr Claiborne Billings    Past Surgical History:  Procedure Laterality Date   ABDOMINAL RECTOPEXY  2011       dr fuller  [facility])   prolapse rectum   ANAL RECTAL MANOMETRY N/A 03/01/2017   Procedure: ANO RECTAL MANOMETRY;  Surgeon: Leighton Ruff, MD;  Location: [facility];  Service: Endoscopy;  Laterality: N/A;   ANTERIOR CERVICAL DECOMP/DISCECTOMY FUSION  05-28-2000    dr Louanne Skye   C3-4  &  C7-T1   ANTERIOR CERVICAL DECOMPRESSION LAMINECTOMY AND FUSION  02-09-2007    dr Arnoldo Morale   decompression/ laminectomy C3-4 & C5-6;  laminectomy C2; fusion C2-5   BREAST SURGERY  1976   breast reduction   COLONOSCOPY     FORAMINOTOMY 1 LEVEL  11-03-1999   dr Louanne Skye  [facility]   left C7-T1 w/ decompression left C8   KNEE SURGERY  AGE 89   bone spur     LUMBAR LAMINECTOMY/ DECOMPRESSION WITH MET-RX  02-09-2005  dr Flavia Shipper   L2-3 laminectomy left cage; fusion L2-4   POSTERIOR CERVICAL FUSION/FORAMINOTOMY  12-08-2000    dr Louanne Skye   right forminotomy C7-T1 and posterior fusion   RIGHT THUMB COLLATERAL LIGAMENT RECONSTRUCTION  02-08-2003   dr Amedeo Plenty   RIGHT THUMB LIGAMENT REPAIR, ARTHROTOMY, SYNOVECTOMY  10-26-2002    dr Amedeo Plenty   SPINE SURGERY  1983 to 01/2007   total 14 surgery's  cervical and lumbar  (including fusion's, diskectomy, laminectomy's, foraminotomy's)   TONSILLECTOMY     TOTAL HIP ARTHROPLASTY Right 07/31/2014   Procedure: TOTAL HIP ARTHROPLASTY ANTERIOR APPROACH;  Surgeon: Renette Butters, MD;  Location: [place];  Service: Orthopedics;  Laterality: Right;   TOTAL HIP ARTHROPLASTY Left 11/19/2015   Procedure: TOTAL HIP ARTHROPLASTY ANTERIOR APPROACH;  Surgeon: Renette Butters, MD;  Location: [place];  Service: Orthopedics;  Laterality: Left;   TRANSTHORACIC ECHOCARDIOGRAM  10-25-2015   dr t. Claiborne Billings   ef 60-655/  no evidence MV stenosis or regurgitation/      TUBAL LIGATION      Allergies  Allergen Reactions   Bupropion Hcl Swelling   Celecoxib Swelling  Naproxen Swelling   Methadone Nausea And Vomiting   Morphine Itching    Current Outpatient Medications  Medication Sig Dispense Refill   aspirin EC 81 MG tablet Take 81 mg by mouth at bedtime.     atorvastatin (LIPITOR) 40 MG tablet Take 1 tablet (40 mg total) by mouth daily. 90 tablet 3   baclofen (LIORESAL) 20 MG tablet Take 20 mg by mouth 3 (three) times daily as needed for muscle spasms.      Calcium Carbonate-Vitamin D (CALCIUM + D PO) Take 1 tablet by mouth 2 (two) times daily. Calcium Magnesium Zinc and Vitamin D     clonazePAM (KLONOPIN) 1 MG tablet Take 1 mg by mouth at bedtime.     fish oil-omega-3 fatty acids 1000 MG capsule Take 1 g by mouth at bedtime.      furosemide (LASIX) 20 MG tablet Take 1 tablet (20 mg total) by mouth daily. 90 tablet 3   Multiple Vitamin (MULTIVITAMIN WITH MINERALS) TABS tablet Take 1 tablet by mouth daily.     omeprazole (PRILOSEC) 20 MG capsule Take 20 mg by mouth daily.     verapamil (CALAN-SR) 240 MG CR tablet TAKE 1 TABLET BY MOUTH AT BEDTIME. 90 tablet 2   atenolol (TENORMIN) 25 MG tablet TAKE 1/2 TABLET BY MOUTH 2 TIMES DAILY. 90 tablet 2   No current facility-administered medications for this visit.    Social History   Socioeconomic History   Marital status: Married    Spouse name: CARROLL   Number of children: 0   Years of education: 16   Highest education level: Bachelor's degree (e.g., BA, AB, BS)  Occupational History   Occupation: Nursing-RN    RETIRED    Employer: [employer]  Tobacco Use   Smoking status: Former    Packs/day: 0.75    Years: 42.00    Pack years: 31.50    Types: Cigarettes    Quit date: 08/08/2017    Years since quitting: 3.6   Smokeless tobacco: Never  Vaping Use   Vaping Use: Never used  Substance and Sexual Activity   Alcohol use: Not Currently    Alcohol/week: 5.0 - 6.0 standard drinks    Types: 5  - 6 Glasses of wine per week    Comment: no longer drinks-stopped approx 6 yrs ago   Drug use: No   Sexual activity: Yes    Birth control/protection: None  Other Topics Concern   Not on file  Social History Narrative   Not on file   Social Determinants of Health   Financial Resource Strain: Not on file  Food Insecurity: Not on file  Transportation Needs: Not on file  Physical Activity: Not on file  Stress: Not on file  Social Connections: Not on file  Intimate Partner Violence: Not on file   Social history is notable in that she is married. She has one stepchild and 2 stepgrandchildren. She does walk.  Fortunately she quit tobacco on Sep 20, 2020  Family History  Problem Relation Age of Onset   COPD Father    Diabetes Mother    Heart disease Mother    Kidney disease Mother    Hypertension Mother     ROS General: Negative; No fevers, chills, or night sweats;  HEENT: Negative; No changes in vision or hearing, sinus congestion, difficulty swallowing Pulmonary: positive for mild COPD; No cough, wheezing, shortness of breath, hemoptysis Cardiovascular: See HPI GI: Positive for irritable bowel syndrome GU: Negative; No dysuria, hematuria,  or difficulty voiding Musculoskeletal: Status post left hip replacement surgery. Hematologic/Oncology: Negative; no easy bruising, bleeding Endocrine: Negative; no heat/cold intolerance; no diabetes Neuro: Negative; no changes in balance, headaches Skin: Negative; No rashes or skin lesions Psychiatric: positive for mild depression for which she takes Zoloft.; No behavioral problems,  Sleep: Negative; No snoring, daytime sleepiness, hypersomnolence, bruxism, restless legs, hypnogognic hallucinations, no cataplexy Other comprehensive 14 point system review is negative.   PE BP 100/62   Pulse (!) 59   Ht 5' 7" (1.702 m)   Wt 117 lb 3.2 oz (53.2 kg)   SpO2 97%   BMI 18.36 kg/m    Repeat blood pressure by me was 116/64  Wt Readings  from Last 3 Encounters:  03/25/21 117 lb 3.2 oz (53.2 kg)  01/09/21 113 lb 14.4 oz (51.7 kg)  11/20/20 109 lb (49.4 kg)   General: Alert, oriented, no distress.  Skin: normal turgor, no rashes, warm and dry HEENT: Normocephalic, atraumatic. Pupils equal round and reactive to light; sclera anicteric; extraocular muscles intact;  Nose without nasal septal hypertrophy Mouth/Parynx benign; Mallinpatti scale 2 Neck: No JVD, no carotid bruits; normal carotid upstroke Lungs: clear to ausculatation and percussion; no wheezing or rales Chest wall: without tenderness to palpitation Heart: PMI not displaced, RRR, s1 s2 normal, 1/6 systolic murmur, no diastolic murmur, no rubs, gallops, thrills, or heaves Abdomen: soft, nontender; no hepatosplenomehaly, BS+; abdominal aorta nontender and not dilated by palpation. Back: no CVA tenderness Pulses 2+ Musculoskeletal: full range of motion, normal strength, no joint deformities Extremities: Significant improvement in prior edema, now trivial no clubbing cyanosis , Homan's sign negative  Neurologic: grossly nonfocal; Cranial nerves grossly wnl Psychologic: Normal mood and affect   March 25, 2021 ECG (independently read by me):  Sinus bradycardia at 59; QS V1-3  November 20, 2020 ECG (independently read by me):  Sinus bradycardia at 53; PR 200 msec, no ectopy  Sep 19, 2020 ECG (independently read by me): NSR at 61; LAD      October 2021 ECG (independently read by me): NSR at 79; QS V1-3; no ectopy  February 02, 2020 ECG (independently read by me): NSR at 71; Probable bistrial enlargement QS V1-26 Aug 2017 ECG (independently read by me): Sinus bradycardia 55 bpm.  QS complex V1 V2.  Normal intervals.  No ectopy.  May 2018 ECG (independently read by me): Sinus bradycardia 56 bpm.  QS V1, V2.  Mild RV conduction delay.  Normal intervals.  No ST segment changes.  November 2017 ECG (independently read by me): Sinus bradycardia 52 bpm.  QRS complex V1 V2.   Normal intervals.  June 2017 ECG (independently read by me): Normal sinus rhythm at 60 bpm.  First-degree AV block with a PR interval of 220 ms.  No significant ST changes.  Incomplete right bundle branch block.   November 2016 ECG (independently read by me): normal sinus rhythm with first-degree heart block with a PR interval at 270 ms.  QRS complex V1 V2.  November 2015 ECG (independently read by me.  (: Normal sinus rhythm at 64 bpm.  Borderline first degree AV block with a PR interval at 204 ms.  Mild RV conduction delay.  Anteroseptal Q waves, unchanged  October 2014ECG: Sinus rhythm with incomplete right bundle branch block. Anteroseptal Q waves unchanged.  LABS:  BMP Latest Ref Rng & Units 01/09/2021 12/05/2020 11/11/2020  Glucose 70 - 99 mg/dL 90 100(H) 88  BUN 8 - 23 mg/dL 24(H) 20 26  Creatinine 0.44 - 1.00 mg/dL 0.70 0.80 0.77  BUN/Creat Ratio 12 - 28 - 25 34(H)  Sodium 135 - 145 mmol/L 143 141 140  Potassium 3.5 - 5.1 mmol/L 3.9 4.3 4.0  Chloride 98 - 111 mmol/L 104 104 103  CO2 20 - 29 mmol/L - 24 25  Calcium 8.7 - 10.3 mg/dL - 9.4 9.6   Hepatic Function Latest Ref Rng & Units 11/11/2020 01/12/2019 01/07/2019  Total Protein 6.0 - 8.5 g/dL 5.9(L) 6.1(L) 5.4(L)  Albumin 3.8 - 4.8 g/dL 4.1 3.7 3.1(L)  AST 0 - 40 IU/L 28 75(H) 40  ALT 0 - 32 IU/L 34(H) 76(H) 42  Alk Phosphatase 44 - 121 IU/L 88 77 68  Total Bilirubin 0.0 - 1.2 mg/dL 0.6 0.9 1.2  Bilirubin, Direct 0.0 - 0.3 mg/dL - - -   CBC Latest Ref Rng & Units 01/09/2021 01/12/2019 01/08/2019  WBC 4.0 - 10.5 K/uL - 6.4 10.8(H)  Hemoglobin 12.0 - 15.0 g/dL 15.0 16.4(H) 15.4(H)  Hematocrit 36.0 - 46.0 % 44.0 48.2(H) 47.0(H)  Platelets 150 - 400 K/uL - 222 150   Lab Results  Component Value Date   MCV 101.5 (H) 01/12/2019   MCV 101.5 (H) 01/08/2019   MCV 101.6 (H) 01/07/2019   Lab Results  Component Value Date   TSH 1.010 11/03/2016   Lipid Panel     Component Value Date/Time   CHOL 130 11/11/2020 1024   TRIG 53  11/11/2020 1024   HDL 55 11/11/2020 1024   CHOLHDL 2.4 11/11/2020 1024   CHOLHDL 3.3 02/28/2015 1023   VLDL 30 02/28/2015 1023   LDLCALC 63 11/11/2020 1024     RADIOLOGY: No results found.  IMPRESSION:  1. Coronary calcification on CT   2. Aortic atherosclerosis (HCC)   3. Leg swelling   4. Hyperlipidemia with target LDL less than 70   5. Centrilobular emphysema ([place])   6. History of longstanding tobacco abuse: Quit Sep 20, 2020      ASSESSMENT AND PLAN: Ms. Canniff is a 70 year-old female who has a history of SVT which had been fairly well-controlled over many years on verapamil 240 mg as well as atenolol 12.5 mg twice a day.  She is unaware of any breakthrough SVT episodes.  She has a greater than 40-year history of tobacco use and in the past has had intermittently quit for short duration.  Fortunately, following on Sep 19, 2020 office visit, she quit tobacco the following day.   In August 2020 a myocardial perfusion study showed normal perfusion without ischemia or infarction, EF of 66%.  This study was done after she had a CT scanning which demonstrated coronary calcification with left main and two-vessel CAD noted on CT imaging.  CT showed mild centrilobular and paraseptal emphysema and underlying COPD.  Her most recent CT which was essentially  unchanged.  With her multivessel coronary calcification, she was referred for a follow-up nuclear perfusion study on September 27, 2020 which showed mild apical thinning which was artifactual.  She had hyperdynamic LV function without scar or ischemia.  With her aortic atherosclerosis and coronary calcification, I had stressed more aggressive lipid-lowering therapy.  Her blood pressure today is stable on verapamil and atenolol in addition to furosemide.  She is now on atorvastatin 40 mg daily and omega-3 fatty acid most recent LDL cholesterol was improved at 63 in July 2022.  Triglycerides were 53, HDL 55 and total cholesterol 130.  She has done well  with the change to  furosemide with reference to her lower extremity edema.  Since that time she does note some trace or even slightly more pretibial edema I have suggested she wear support stockings.  She is now walking 1 to 2 miles on a daily basis.  I again commended her on her smoking cessation.  I will see her in 1 year for reevaluation or sooner as needed.   Troy Sine, MD, [facility]  04/01/2021 1:41 PM

## 2021-04-01 ENCOUNTER — Encounter: Payer: Self-pay | Admitting: Cardiovascular Disease

## 2021-05-05 ENCOUNTER — Other Ambulatory Visit: Payer: Self-pay | Admitting: Cardiovascular Disease

## 2021-11-10 ENCOUNTER — Other Ambulatory Visit: Payer: Self-pay | Admitting: Cardiovascular Disease

## 2021-12-19 ENCOUNTER — Ambulatory Visit (HOSPITAL_COMMUNITY): Payer: Medicare Other

## 2021-12-30 ENCOUNTER — Ambulatory Visit (HOSPITAL_COMMUNITY)
Admission: RE | Admit: 2021-12-30 | Discharge: 2021-12-30 | Disposition: A | Discharge status: Home / Self Care | Payer: Medicare Other | Source: Ambulatory Visit | Attending: Acute Care | Admitting: Acute Care

## 2021-12-30 DIAGNOSIS — Z87891 Personal history of nicotine dependence: Secondary | ICD-10-CM | POA: Diagnosis not present

## 2022-01-19 ENCOUNTER — Other Ambulatory Visit: Payer: Self-pay

## 2022-01-19 DIAGNOSIS — Z87891 Personal history of nicotine dependence: Secondary | ICD-10-CM

## 2022-01-19 DIAGNOSIS — Z122 Encounter for screening for malignant neoplasm of respiratory organs: Secondary | ICD-10-CM

## 2022-02-10 ENCOUNTER — Other Ambulatory Visit: Payer: Self-pay | Admitting: Cardiovascular Disease

## 2022-03-05 ENCOUNTER — Encounter: Payer: Self-pay | Admitting: Cardiovascular Disease

## 2022-03-05 ENCOUNTER — Ambulatory Visit: Payer: Medicare Other | Attending: Cardiovascular Disease | Admitting: Cardiovascular Disease

## 2022-03-05 DIAGNOSIS — I059 Rheumatic mitral valve disease, unspecified: Secondary | ICD-10-CM

## 2022-03-05 DIAGNOSIS — Z8679 Personal history of other diseases of the circulatory system: Secondary | ICD-10-CM | POA: Diagnosis not present

## 2022-03-05 DIAGNOSIS — I471 Supraventricular tachycardia, unspecified: Secondary | ICD-10-CM

## 2022-03-05 DIAGNOSIS — J432 Centrilobular emphysema: Secondary | ICD-10-CM

## 2022-03-05 DIAGNOSIS — Z933 Colostomy status: Secondary | ICD-10-CM

## 2022-03-05 DIAGNOSIS — E785 Hyperlipidemia, unspecified: Secondary | ICD-10-CM

## 2022-03-05 DIAGNOSIS — Z87891 Personal history of nicotine dependence: Secondary | ICD-10-CM

## 2022-03-05 DIAGNOSIS — M7989 Other specified soft tissue disorders: Secondary | ICD-10-CM

## 2022-03-05 DIAGNOSIS — I2510 Atherosclerotic heart disease of native coronary artery without angina pectoris: Secondary | ICD-10-CM

## 2022-03-05 NOTE — Patient Instructions (Signed)
Medication Instructions:  No changes   *If you need a refill on your cardiac medications before your next appointment, please call your pharmacy*   Lab Work: Not needed   Testing/Procedures: Not needed   Follow-Up: At [facility], you and your health needs are our priority.  As part of our continuing mission to provide you with exceptional heart care, we have created designated Provider Care Teams.  These Care Teams include your primary Cardiologist (physician) and Advanced Practice Providers (APPs -  Physician Assistants and Nurse Practitioners) who all work together to provide you with the care you need, when you need it.     Your next appointment:   12 month(s)  The format for your next appointment:   In Person  Provider:   Thomas Kelly, MD     

## 2022-03-05 NOTE — Progress Notes (Signed)
Patient ID: [name], female   DOB: Apr 30, 1950, 71 y.o.   MRN: [id number]     PCP: Dr. Gilford Rile  HPI: [name] is a 71 y.o. female resents to the office today for a 12 month followup cardiology evaluation.   [name] has a history of supraventricular tachycardia which has been fairly well-controlled on combination verapamil SR  plus beta blocker therapy with atenolol.  She has a history of underlying mitral valve prolapse, mild carotid disease, hyperlipidemia, as well as mild COPD. She had smoked for over 30 years but quit smoking.  She was hospitalized after being bitten by a copperhead snake and required 5 days in the hospital. Apparently her cardiac medicines were held at that time and during the hospitalization she had recurrent episodes of tachycardia dysrhythmia. As long as she takes her medicines, and she feels that her heart rhythm is controlled. There is rare intermittent palpitations.typically, these episodes may last for under a minute and typically resolve with the Valsalva maneuver if they don't resolve spontaneously.  When I saw her in 2017 she was unaware of any breakthrough arrhythmias.  At that time her blood pressure was low and there was mild orthostatic drop.  Her resting pulse was 60.  I recommended that she continue to take Verapamil SR 240 mg but I reduced her atenolol dose to 12.5  mg twice a day.  She underwent a follow-up echo Doppler study on 10/25/2015.  This showed an EF of 60-65%.  There was normal wall motion.  There was no significant valvular pathology.  She was given preoperative clearance to undergo hip surgery by Dr. Fredonia Highland which was done in July 2017 and she tolerated this well.  I last saw her, she had undergone  a panoramic dental x-ray.  Incidentally, she was noted to have calcified carotid atheromas of the internal carotid artery suggestive of atherosclerotic disease.  I scheduled her for carotid duplex imaging which showed heterogeneous  plaque bilaterally with narrowing in the 1-39% range.  She had normal subclavian arteries, and patent vertebral arteries with antegrade flow.  She resumed smoking after being off cigarettes for 2 years.  She resumed smoking after a death of a family member.    I saw her in May 2019 after not having seen her in over a year., she denies any episodes of chest pain or awareness of palpitations.  She was concerned about her weight loss.  She had resumed smoking but quit 1 month previous to her last evaluation.  Her smoking duration has been over 40 years.  She admits to exertional dyspnea.  She denies any prolonged cough.  In July 2018, LDL cholesterol was 78 on atorvastatin and only 5 mg.  She has continued to be on atenolol 12.5 mg twice a day and verapamil 240 mg daily.  She is on aspirin.  In December 2018 carotid duplex imaging showed mild bilateral carotid plaque which was not hemodynamically significant.  Due to weight loss I referred her to [name], NP of pulmonary so that she would qualify for a low-dose CT of her chest to evaluate for possible lung CA screening protocol.  This apparently did not show any cancer but there was aortic atherosclerosis in addition to left main and two-vessel CAD noted on the CT scan.  She had mild diffuse bronchial wall thickening with mild centrilobular and paraseptal emphysema, with imaging findings suggestive of underlying COPD.  When I saw her in January 27, 2019 she previously  had an episode  of food poisoning associated with dehydration and small bowel obstruction leading to a hospitalization.  She admits to weight loss.  She denied any anginal type symptoms.  She was continuing to smoke cigarettes.  She is now set up to undergo yearly lung radiation CT of her chest for lung CA screening.Marland Kitchen  She was evaluated by me in October 2021.  Since her prior evaluation she continued to feel well.  She was still smoking  smoking and her smoking has been reduced to 5  cigarettes/day.  She had undergone laboratory by Dr. [name] in April 2021 which showed a total cholesterol of 129, LDL 74 HDL 20 triglycerides 68.  She walks 2 miles per day typically drinks 5 to 7 days/week.  Back in December 19, 2019 she underwent a yearly follow-up CT of her chest was cancer screening.  This essentially was unchanged and showed benign appearance.  She again was noted to have her centrilobular emphysema.  There was aortic atherosclerosis as well as coronary artery atherosclerosis.    I saw her on Sep 19, 2020 and since her prior evaluation she had remained stable without chest pain but admitted to shortness of breath with activity.  She has documented centrilobular emphysema.  She has been trying to walk up to 2 miles per day.  Over the prior 3 weeks she noticed development of lower extremity bilateral edema left greater than right which is pitting.  She was still smoking approximately 4 cigarettes/day. She had undergone laboratory by her primary physician.  Several months ago her atorvastatin was increased to 20 mg.  LDL on her increased atorvastatin on August 15, 2020 was 88 with total cholesterol 131, HDL 36 and triglycerides 79.  Chemistry was normal.  With her documented coronary calcification and aortic atherosclerosis I recommended more aggressive lipid management with target LDL in the 50s and 60s if at all possible.  As result atorvastatin was increased to 40 mg daily.  She was experiencing some leg edema and initiated HCTZ 12.5 mg daily and if after 1 week edema was still present she could increase this to 25 mg as needed and go back to 12.5 mg if resolved.  I saw her in follow-up on November 20, 2020.  Since her last evaluation she was successful in smoking cessation and quit tobacco the day after her office visit on Sep 20, 2020.  She has continued to experience pretibial edema despite taking HCTZ at 25 mg.  She denies chest pain.  She is tolerating rosuvastatin.  She underwent a  Lexiscan Myoview study which was low risk and revealed hyperdynamic LV function with EF greater than 65% and no evidence for scar or ischemia.  With her continued leg edema despite taking HCTZ, I recommended she discontinue hydrochlorothiazide and initiated furosemide 20 mg daily.  She continues to be on verapamil 2040 mg and atenolol 12.5 mg twice a day and her blood pressure was stable.  I last saw her on March 25, 2021.  At that time she continued to be smoke-free.    Her swelling significantly improved with furosemide but at times she may note trace ankle to pretibial edema.  She is now walking 1 to 2 miles per day.  She denies chest pain or palpitations.    I last saw her, she had 1 colostomy on December 16, 2021.  On January 25, 2022 she had a bowel obstruction which time her medications were held.  She was hospitalized from October 1 through  February 03, 2022 and on February 06, 2022 had a follow-up ostomy clinic visit note Sara Julian, RN.  , [name] is feeling improved.  She notes significant improvement in her breathing since she quit tobacco in on Sep 20, 2020.  She denies any chest pain or shortness of breath.  Of note, when her medications were held with her bowel obstruction, her heart rate had increased to the 170s.  Presently, she is now on atenolol 12.5 mg twice a day, furosemide 20 mg, verapamil 250 mg daily and continues to be on atorvastatin 40 mg, omega-3 fatty acid 1 g.  She is on Prilosec.  She presents for reevaluation.   Past Medical History:  Diagnosis Date   Anxiety    Back pain, chronic    Bowel obstruction (HCC)    Complication of anesthesia    PT HAS POST OP URINARY RETENTION:  per patient "difficult stick- needs central line or picc"  04-06-2017 AT [facility] IV STARTED BY CRNA NO ISSUE   COPD with emphysema ([place])    DDD (degenerative disc disease), cervical    POST MULTIPLE SURGERY'S   DDD (degenerative disc disease), lumbosacral    POST MULTIPLE SURGERY'S   Difficult  intravenous access    Fecal incontinence    once a week----  TREATED W/ SACRAL NERVE STIMULATOR PLACED 12/ 2018   Heart murmur    History of chronic bronchitis    History of spinal cord injury 02/09/2007   PT FELL-- S/P  CERVICAL SPINE SURGERY   History of urinary retention    PT HAS POST OP URINARY RETENTION DUE TO ANESTHESIA   History of venomous snake bite 2013   RIGHT MIDDLE FINGER BY COOPERHEAD--- TREATMENT W/ ANTIVENOM   Hyperlipemia    IBS (irritable bowel syndrome)    flairs up occasionally esp if stressed   Incomplete right bundle branch block    Major depression    Mild carotid artery disease [facility]) cardiologist-  dr Claiborne Billings   mild bilateral ICA per duplex 04-12-2017  1-39%   MVP (mitral valve prolapse)    no evidence stenosis or regurgitation per last echo 10-25-2015   OA (osteoarthritis)    PONV (postoperative nausea and vomiting)    Pre-diabetes    PSVT (paroxysmal supraventricular tachycardia)    cardiologist-- dr Claiborne Billings   SBO (small bowel obstruction) ([place]) 01/08/2019   SVT (supraventricular tachycardia) ([place]) 02/27/2014    Past Surgical History:  Procedure Laterality Date   ABDOMINAL RECTOPEXY  2011       dr fuller  [facility])   prolapse rectum   ANAL RECTAL MANOMETRY N/A 03/01/2017   Procedure: ANO RECTAL MANOMETRY;  Surgeon: Leighton Ruff, MD;  Location: [facility];  Service: Endoscopy;  Laterality: N/A;   ANTERIOR CERVICAL DECOMP/DISCECTOMY FUSION  05-28-2000    dr Louanne Skye   C3-4  &  C7-T1   ANTERIOR CERVICAL DECOMPRESSION LAMINECTOMY AND FUSION  02-09-2007    dr Arnoldo Morale   decompression/ laminectomy C3-4 & C5-6;  laminectomy C2; fusion C2-5   BREAST SURGERY  1976   breast reduction   COLONOSCOPY     FORAMINOTOMY 1 LEVEL  11-03-1999   dr Louanne Skye  [facility]   left C7-T1 w/ decompression left C8   KNEE SURGERY  AGE 74   bone spur     LUMBAR LAMINECTOMY/ DECOMPRESSION WITH MET-RX  02-09-2005  dr Flavia Shipper   L2-3 laminectomy left cage; fusion L2-4   POSTERIOR  CERVICAL FUSION/FORAMINOTOMY  12-08-2000    dr Louanne Skye   right  forminotomy C7-T1 and posterior fusion   RIGHT THUMB COLLATERAL LIGAMENT RECONSTRUCTION  02-08-2003   dr Amedeo Plenty   RIGHT THUMB LIGAMENT REPAIR, ARTHROTOMY, SYNOVECTOMY  10-26-2002    dr Amedeo Plenty   SPINE SURGERY  1983 to 01/2007   total 14 surgery's  cervical and lumbar  (including fusion's, diskectomy, laminectomy's, foraminotomy's)   TONSILLECTOMY     TOTAL HIP ARTHROPLASTY Right 07/31/2014   Procedure: TOTAL HIP ARTHROPLASTY ANTERIOR APPROACH;  Surgeon: Renette Butters, MD;  Location: [place];  Service: Orthopedics;  Laterality: Right;   TOTAL HIP ARTHROPLASTY Left 11/19/2015   Procedure: TOTAL HIP ARTHROPLASTY ANTERIOR APPROACH;  Surgeon: Renette Butters, MD;  Location: [place];  Service: Orthopedics;  Laterality: Left;   TRANSTHORACIC ECHOCARDIOGRAM  10-25-2015   dr t. Claiborne Billings   ef 60-655/  no evidence MV stenosis or regurgitation/     TUBAL LIGATION      Allergies  Allergen Reactions   Bupropion Hcl Swelling   Celecoxib Swelling   Naproxen Swelling   Methadone Nausea And Vomiting   Morphine Itching    No current facility-administered medications for this visit.   No current outpatient medications on file.   Facility-Administered Medications Ordered in Other Visits  Medication Dose Route Frequency Provider Last Rate Last Admin   acetaminophen (TYLENOL) tablet 650 mg  650 mg Oral Q6H PRN Kristopher Oppenheim, DO       Or   acetaminophen (TYLENOL) suppository 650 mg  650 mg Rectal Q6H PRN Kristopher Oppenheim, DO       acidophilus (RISAQUAD) capsule 1 capsule  1 capsule Oral Daily Vann, Jessica U, DO   1 capsule at 03/12/22 1728   amoxicillin-clavulanate (AUGMENTIN) 875-125 MG per tablet 1 tablet  1 tablet Oral Q12H Vann, Jessica U, DO   1 tablet at 03/12/22 1024   atenolol (TENORMIN) tablet 12.5 mg  12.5 mg Oral BID Kristopher Oppenheim, DO   12.5 mg at 03/12/22 1024   atorvastatin (LIPITOR) tablet 40 mg  40 mg Oral Daily Kristopher Oppenheim, DO   40 mg at 03/12/22  1024   baclofen (LIORESAL) tablet 20 mg  20 mg Oral QHS Kristopher Oppenheim, DO   20 mg at 03/11/22 2117   clonazePAM (KLONOPIN) tablet 1 mg  1 mg Oral QHS Kristopher Oppenheim, DO   1 mg at 03/11/22 2118   docusate sodium (COLACE) capsule 100 mg  100 mg Oral BID Eulogio Bear U, DO   100 mg at 03/12/22 1024   heparin injection 5,000 Units  5,000 Units Subcutaneous Q8H Kristopher Oppenheim, DO   5,000 Units at 03/12/22 1355   HYDROmorphone (DILAUDID) injection 0.5-1 mg  0.5-1 mg Intravenous Q2H PRN Kristopher Oppenheim, DO   1 mg at 03/12/22 1729   loperamide (IMODIUM) capsule 2 mg  2 mg Oral PRN Eulogio Bear U, DO       LORazepam (ATIVAN) injection 1 mg  1 mg Intravenous Once PRN Kristopher Oppenheim, DO       oxyCODONE (Oxy IR/ROXICODONE) immediate release tablet 5 mg  5 mg Oral Q4H PRN Kristopher Oppenheim, DO       pantoprazole (PROTONIX) EC tablet 40 mg  40 mg Oral Daily Kristopher Oppenheim, DO   40 mg at 03/12/22 1024   prochlorperazine (COMPAZINE) injection 10 mg  10 mg Intravenous Q6H PRN Kristopher Oppenheim, DO   10 mg at 03/12/22 0021   verapamil (CALAN-SR) CR tablet 240 mg  240 mg Oral QHS Kristopher Oppenheim, DO   240 mg at 03/11/22 2118  Social History   Socioeconomic History   Marital status: Married    Spouse name: CARROLL   Number of children: 0   Years of education: 16   Highest education level: Bachelor's degree (e.g., BA, AB, BS)  Occupational History   Occupation: Nursing-RN    RETIRED    Employer: [employer]  Tobacco Use   Smoking status: Former    Packs/day: 0.75    Years: 42.00    Total pack years: 31.50    Types: Cigarettes    Quit date: 08/08/2017    Years since quitting: 4.5   Smokeless tobacco: Never  Vaping Use   Vaping Use: Never used  Substance and Sexual Activity   Alcohol use: Not Currently    Alcohol/week: 5.0 - 6.0 standard drinks of alcohol    Types: 5 - 6 Glasses of wine per week    Comment: no longer drinks-stopped approx 6 yrs ago   Drug use: No   Sexual activity: Yes    Birth control/protection: None  Other Topics  Concern   Not on file  Social History Narrative   Not on file   Social Determinants of Health   Financial Resource Strain: Low Risk  (01/08/2019)   Overall Financial Resource Strain (CARDIA)    Difficulty of Paying Living Expenses: Not hard at all  Food Insecurity: No Food Insecurity (01/08/2019)   Hunger Vital Sign    Worried About Running Out of Food in the Last Year: Never true    Ran Out of Food in the Last Year: Never true  Transportation Needs: No Transportation Needs (01/08/2019)   PRAPARE - Hydrologist (Medical): No    Lack of Transportation (Non-Medical): No  Physical Activity: Sufficiently Active (01/08/2019)   Exercise Vital Sign    Days of Exercise per Week: 7 days    Minutes of Exercise per Session: 60 min  Stress: No Stress Concern Present (01/08/2019)   [place]    Feeling of Stress : Only a little  Social Connections: Moderately Integrated (01/08/2019)   Social Connection and Isolation Panel [NHANES]    Frequency of Communication with Friends and Family: More than three times a week    Frequency of Social Gatherings with Friends and Family: Once a week    Attends Religious Services: Never    Marine scientist or Organizations: Yes    Attends Music therapist: More than 4 times per year    Marital Status: Married  Human resources officer Violence: Not At Risk (01/08/2019)   Humiliation, Afraid, Rape, and Kick questionnaire    Fear of Current or Ex-Partner: No    Emotionally Abused: No    Physically Abused: No    Sexually Abused: No   Social history is notable in that she is married. She has one stepchild and 2 stepgrandchildren. She does walk.  Fortunately she quit tobacco on Sep 20, 2020  Family History  Problem Relation Age of Onset   COPD Father    Diabetes Mother    Heart disease Mother    Kidney disease Mother    Hypertension Mother      ROS General: Negative; No fevers, chills, or night sweats;  HEENT: Negative; No changes in vision or hearing, sinus congestion, difficulty swallowing Pulmonary: positive for mild COPD; No cough, wheezing, shortness of breath, hemoptysis Cardiovascular: See HPI GI: Positive for irritable bowel syndrome GU: Negative; No dysuria, hematuria, or difficulty voiding  Musculoskeletal: Status post left hip replacement surgery. Hematologic/Oncology: Negative; no easy bruising, bleeding Endocrine: Negative; no heat/cold intolerance; no diabetes Neuro: Negative; no changes in balance, headaches Skin: Negative; No rashes or skin lesions Psychiatric: positive for mild depression for which she takes Zoloft.; No behavioral problems,  Sleep: Negative; No snoring, daytime sleepiness, hypersomnolence, bruxism, restless legs, hypnogognic hallucinations, no cataplexy Other comprehensive 14 point system review is negative.   PE BP 116/74 (BP Location: Left Arm, Patient Position: Sitting)   Pulse 86   Ht _0  (1.702 m)   Wt 112 lb 14.4 oz (51.2 kg)   SpO2 97%   BMI 17.68 kg/m    Repeat  blood pressure by me was 122/74  Wt Readings from Last 3 Encounters:  03/05/22 112 lb 14.4 oz (51.2 kg)  03/25/21 117 lb 3.2 oz (53.2 kg)  01/09/21 113 lb 14.4 oz (51.7 kg)    General: Alert, oriented, no distress.  Skin: normal turgor, no rashes, warm and dry HEENT: Normocephalic, atraumatic. Pupils equal round and reactive to light; sclera anicteric; extraocular muscles intact;  Nose without nasal septal hypertrophy Mouth/Parynx benign; Mallinpatti scale 2 Neck: No JVD, no carotid bruits; normal carotid upstroke Lungs: clear to ausculatation and percussion; no wheezing or rales Chest wall: without tenderness to palpitation Heart: PMI not displaced, RRR, s1 s2 normal, 1/6 systolic murmur, no diastolic murmur, no rubs, gallops, thrills, or heaves Abdomen: Coloscopy left lower quadrant; soft, nontender; no  hepatosplenomehaly, BS+; abdominal aorta nontender and not dilated by palpation. Back: no CVA tenderness Pulses 2+ Musculoskeletal: full range of motion, normal strength, no joint deformities Extremities: no clubbing cyanosis or edema, Homan's sign negative  Neurologic: grossly nonfocal; Cranial nerves grossly wnl Psychologic: Normal mood and affect  March 05, 2022 ECG (independently read by me): NSR at 86, Biatrial enlargement, LAD QS v1-2  March 25, 2021 ECG (independently read by me):  Sinus bradycardia at 59; QS V1-3  November 20, 2020 ECG (independently read by me):  Sinus bradycardia at 53; PR 200 msec, no ectopy  Sep 19, 2020 ECG (independently read by me): NSR at 61; LAD      October 2021 ECG (independently read by me): NSR at 79; QS V1-3; no ectopy  February 02, 2020 ECG (independently read by me): NSR at 71; Probable bistrial enlargement QS V1-26 Aug 2017 ECG (independently read by me): Sinus bradycardia 55 bpm.  QS complex V1 V2.  Normal intervals.  No ectopy.  May 2018 ECG (independently read by me): Sinus bradycardia 56 bpm.  QS V1, V2.  Mild RV conduction delay.  Normal intervals.  No ST segment changes.  November 2017 ECG (independently read by me): Sinus bradycardia 52 bpm.  QRS complex V1 V2.  Normal intervals.  June 2017 ECG (independently read by me): Normal sinus rhythm at 60 bpm.  First-degree AV block with a PR interval of 220 ms.  No significant ST changes.  Incomplete right bundle branch block.   November 2016 ECG (independently read by me): normal sinus rhythm with first-degree heart block with a PR interval at 270 ms.  QRS complex V1 V2.  November 2015 ECG (independently read by me.  (: Normal sinus rhythm at 64 bpm.  Borderline first degree AV block with a PR interval at 204 ms.  Mild RV conduction delay.  Anteroseptal Q waves, unchanged  October 2014ECG: Sinus rhythm with incomplete right bundle branch block. Anteroseptal Q waves unchanged.  LABS:      Latest Ref Rng &  Units 03/11/2022    5:48 AM 03/10/2022    4:59 AM 03/09/2022    3:15 PM  BMP  Glucose 70 - 99 mg/dL 71  116  105   BUN 8 - 23 mg/dL _0 Creatinine 0.44 - 1.00 mg/dL 0.77  0.75  0.80   Sodium 135 - 145 mmol/L 140  141  141   Potassium 3.5 - 5.1 mmol/L 3.8  3.6  3.7   Chloride 98 - 111 mmol/L 107  107  105   CO2 22 - 32 mmol/L _1 Calcium 8.9 - 10.3 mg/dL 8.8  9.4  9.4       Latest Ref Rng & Units 03/11/2022    5:48 AM 03/10/2022    4:59 AM 03/09/2022    3:15 PM  Hepatic Function  Total Protein 6.5 - 8.1 g/dL 5.3  6.0  6.9   Albumin 3.5 - 5.0 g/dL 2.7  3.1  3.7   AST 15 - 41 U/L _2 ALT 0 - 44 U/L _3 Alk Phosphatase 38 - 126 U/L 62  69  89   Total Bilirubin 0.3 - 1.2 mg/dL 0.8  0.5  0.5       Latest Ref Rng & Units 03/11/2022    5:48 AM 03/10/2022    4:59 AM 03/09/2022    3:15 PM  CBC  WBC 4.0 - 10.5 K/uL 3.8  4.6  6.3   Hemoglobin 12.0 - 15.0 g/dL 11.4  12.1  13.2   Hematocrit 36.0 - 46.0 % 36.4  39.1  43.7   Platelets 150 - 400 K/uL 269  327  383    Lab Results  Component Value Date   MCV 98.1 03/11/2022   MCV 99.0 03/10/2022   MCV 99.3 03/09/2022   Lab Results  Component Value Date   TSH 1.010 11/03/2016   Lipid Panel     Component Value Date/Time   CHOL 130 11/11/2020 1024   TRIG 53 11/11/2020 1024   HDL 55 11/11/2020 1024   CHOLHDL 2.4 11/11/2020 1024   CHOLHDL 3.3 02/28/2015 1023   VLDL 30 02/28/2015 1023   LDLCALC 63 11/11/2020 1024     RADIOLOGY: No results found.  IMPRESSION:  1. History of PSVT (paroxysmal supraventricular tachycardia)   2. Mitral valve disorder   3. Hyperlipidemia with target LDL less than 70   4. Coronary artery calcification seen on CAT scan   5. Centrilobular emphysema ([place])   6. History of longstanding tobacco abuse: Quit Sep 20, 2020   7. Leg swelling   8. S/P colostomy [facility])     ASSESSMENT AND PLAN: [name] is a 71 year-old female who has a history of  SVT which had been fairly well-controlled over many years on verapamil 240 mg as well as atenolol 12.5 mg twice a day and remained stable without breakthrough SVT episodes.  She had a greater than 40-year history of tobacco use and fortunately quit tobacco on Sep 20, 2020.  Since that time, her breathing has significantly improved.  He has been documented to have normal LV function and in August 2020 myocardial perfusion study showed normal perfusion without ischemia or infarction.  Documented to have centrilobular and paraseptal emphysema with underlying COPD.  He has documented multivessel coronary calcification and her last nuclear perfusion study in June 2022 showed apical thinning felt to be artifactual without scar  or ischemia.  Since her last evaluation she required 3 and underwent colostomy on December 16, 2021.  Unfortunately she developed a bowel obstruction leading to hospitalization from October 1 through February 03, 2022.  During that time apparently her medications were held and she developed recurrent tachydysrhythmia with heart rates up to 175 at [facility].  She is now back on her medical regimen consisting of a atenolol 12.5 mg twice a day, and verapamil 240 mg daily.  She does experience some left leg swelling and takes furosemide 20 mg.  She is on atorvastatin 40 mg and omega-3 fatty acid 1 g daily.  She has been taking Prilosec.  Presently, her blood pressure is stable.  I commended her in her complete absence of tobacco use.  She continues to take furosemide for her swelling.  Cardiac wise presently she is stable.  I will see her in 1 year for reevaluation or sooner as needed.  Troy Sine, MD, [facility]  03/12/2022 6:45 PM

## 2022-03-09 ENCOUNTER — Emergency Department (HOSPITAL_COMMUNITY): Payer: Medicare Other

## 2022-03-09 ENCOUNTER — Other Ambulatory Visit: Payer: Self-pay

## 2022-03-09 ENCOUNTER — Encounter (HOSPITAL_COMMUNITY): Payer: Self-pay

## 2022-03-09 ENCOUNTER — Inpatient Hospital Stay (HOSPITAL_COMMUNITY)
Admission: EM | Admit: 2022-03-09 | Discharge: 2022-03-13 | DRG: 392 | Disposition: A | Discharge status: Home / Self Care | Payer: Medicare Other | Attending: Internal Medicine | Admitting: Internal Medicine

## 2022-03-09 DIAGNOSIS — K5900 Constipation, unspecified: Principal | ICD-10-CM | POA: Diagnosis present

## 2022-03-09 DIAGNOSIS — R109 Unspecified abdominal pain: Secondary | ICD-10-CM | POA: Diagnosis not present

## 2022-03-09 DIAGNOSIS — J439 Emphysema, unspecified: Secondary | ICD-10-CM | POA: Diagnosis present

## 2022-03-09 DIAGNOSIS — Z933 Colostomy status: Secondary | ICD-10-CM

## 2022-03-09 DIAGNOSIS — I2510 Atherosclerotic heart disease of native coronary artery without angina pectoris: Secondary | ICD-10-CM | POA: Diagnosis present

## 2022-03-09 DIAGNOSIS — E785 Hyperlipidemia, unspecified: Secondary | ICD-10-CM | POA: Diagnosis present

## 2022-03-09 DIAGNOSIS — K819 Cholecystitis, unspecified: Secondary | ICD-10-CM

## 2022-03-09 DIAGNOSIS — G8929 Other chronic pain: Secondary | ICD-10-CM

## 2022-03-09 DIAGNOSIS — Z886 Allergy status to analgesic agent status: Secondary | ICD-10-CM

## 2022-03-09 DIAGNOSIS — M549 Dorsalgia, unspecified: Secondary | ICD-10-CM | POA: Diagnosis present

## 2022-03-09 DIAGNOSIS — Z7982 Long term (current) use of aspirin: Secondary | ICD-10-CM

## 2022-03-09 DIAGNOSIS — Z96643 Presence of artificial hip joint, bilateral: Secondary | ICD-10-CM | POA: Diagnosis present

## 2022-03-09 DIAGNOSIS — Z8679 Personal history of other diseases of the circulatory system: Secondary | ICD-10-CM

## 2022-03-09 DIAGNOSIS — R1011 Right upper quadrant pain: Principal | ICD-10-CM | POA: Diagnosis present

## 2022-03-09 DIAGNOSIS — F419 Anxiety disorder, unspecified: Secondary | ICD-10-CM | POA: Diagnosis present

## 2022-03-09 DIAGNOSIS — Z79899 Other long term (current) drug therapy: Secondary | ICD-10-CM

## 2022-03-09 DIAGNOSIS — K589 Irritable bowel syndrome without diarrhea: Secondary | ICD-10-CM | POA: Diagnosis present

## 2022-03-09 DIAGNOSIS — Z885 Allergy status to narcotic agent status: Secondary | ICD-10-CM

## 2022-03-09 DIAGNOSIS — Z87891 Personal history of nicotine dependence: Secondary | ICD-10-CM

## 2022-03-09 DIAGNOSIS — Z881 Allergy status to other antibiotic agents status: Secondary | ICD-10-CM

## 2022-03-09 DIAGNOSIS — Z981 Arthrodesis status: Secondary | ICD-10-CM

## 2022-03-09 DIAGNOSIS — I4719 Other supraventricular tachycardia: Secondary | ICD-10-CM | POA: Diagnosis present

## 2022-03-09 DIAGNOSIS — Z825 Family history of asthma and other chronic lower respiratory diseases: Secondary | ICD-10-CM

## 2022-03-09 LAB — CBC
HCT: 43.7 % (ref 36.0–46.0)
Hemoglobin: 13.2 g/dL (ref 12.0–15.0)
MCH: 30 pg (ref 26.0–34.0)
MCHC: 30.2 g/dL (ref 30.0–36.0)
MCV: 99.3 fL (ref 80.0–100.0)
Platelets: 383 10*3/uL (ref 150–400)
RBC: 4.4 MIL/uL (ref 3.87–5.11)
RDW: 12.9 % (ref 11.5–15.5)
WBC: 6.3 10*3/uL (ref 4.0–10.5)
nRBC: 0 % (ref 0.0–0.2)

## 2022-03-09 LAB — URINALYSIS, ROUTINE W REFLEX MICROSCOPIC
Bilirubin Urine: NEGATIVE
Glucose, UA: NEGATIVE mg/dL
Hgb urine dipstick: NEGATIVE
Ketones, ur: NEGATIVE mg/dL
Leukocytes,Ua: NEGATIVE
Nitrite: NEGATIVE
Protein, ur: NEGATIVE mg/dL
Specific Gravity, Urine: 1.016 (ref 1.005–1.030)
pH: 6 (ref 5.0–8.0)

## 2022-03-09 LAB — COMPREHENSIVE METABOLIC PANEL
ALT: 17 U/L (ref 0–44)
AST: 27 U/L (ref 15–41)
Albumin: 3.7 g/dL (ref 3.5–5.0)
Alkaline Phosphatase: 89 U/L (ref 38–126)
Anion gap: 11 (ref 5–15)
BUN: 14 mg/dL (ref 8–23)
CO2: 25 mmol/L (ref 22–32)
Calcium: 9.4 mg/dL (ref 8.9–10.3)
Chloride: 105 mmol/L (ref 98–111)
Creatinine, Ser: 0.8 mg/dL (ref 0.44–1.00)
GFR, Estimated: >60 mL/min (ref >60)
Glucose, Bld: 105 mg/dL — ABNORMAL HIGH (ref 70–99)
Potassium: 3.7 mmol/L (ref 3.5–5.1)
Sodium: 141 mmol/L (ref 135–145)
Total Bilirubin: 0.5 mg/dL (ref 0.3–1.2)
Total Protein: 6.9 g/dL (ref 6.5–8.1)

## 2022-03-09 LAB — LIPASE, BLOOD: Lipase: 30 U/L (ref 11–51)

## 2022-03-09 MED ORDER — LACTATED RINGERS IV BOLUS
1000.0000 mL | Freq: Once | INTRAVENOUS | Status: AC
  Administered 2022-03-10: 1000 mL via INTRAVENOUS

## 2022-03-09 MED ORDER — ONDANSETRON HCL 4 MG/2ML IJ SOLN
4.0000 mg | Freq: Once | INTRAMUSCULAR | Status: AC | PRN
  Administered 2022-03-10: 4 mg via INTRAVENOUS
  Filled 2022-03-09: qty 2

## 2022-03-09 MED ORDER — HYDROMORPHONE HCL 1 MG/ML IJ SOLN
0.5000 mg | Freq: Once | INTRAMUSCULAR | Status: AC
  Administered 2022-03-10: 0.5 mg via INTRAVENOUS
  Filled 2022-03-09: qty 1

## 2022-03-09 NOTE — ED Triage Notes (Signed)
Pt with permanent colostomy and recent bowel obstruction here for eval of RUQ abdominal pain since this morning. Denies n/v/d. Normal amount of output in the colostomy. Appears very uncomfortable in triage.

## 2022-03-09 NOTE — ED Provider Triage Note (Signed)
Emergency Medicine Provider Triage Evaluation Note  [name] , a 71 y.o. female  was evaluated in triage.  Pt complains of right upper quadrant abdominal pain.  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  Patient has a history of a colostomy and a bowel blockage previously.  Review of Systems  Positive: Normal output into colostomy Negative: No fever no vomiting no diarrhea  Physical Exam  BP (!) 151/64   Pulse 90   Temp 98.5 F (36.9 C)   Resp 16   SpO2 99%  Gen:   Awake, no distress   Resp:  Normal effort  MSK:   Moves extremities without difficulty  Other:  Abdomen soft tender right upper quadrant  Medical Decision Making  Medically screening exam initiated at 3:38 PM.  Appropriate orders placed.  [name] was informed that the remainder of the evaluation will be completed by another provider, this initial triage assessment does not replace that evaluation, and the importance of remaining in the ED until their evaluation is complete.     Fransico Meadow, [place] 03/09/22 1539

## 2022-03-09 NOTE — ED Provider Notes (Signed)
[facility] EMERGENCY DEPARTMENT Provider Note   CSN: [id number] Arrival date & time: 03/09/22  1344     History {Add pertinent medical, surgical, social history, OB history to HPI:1} Chief Complaint  Patient presents with   Abdominal Pain    [name] is a 71 y.o. female with history of SBO, IBS, CAD, COPD,  [name] is a 71 y.o. 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 She was transferred to [facility] for further surgical management.   Procedure: EXPLORATORY LAPAROTOMY, EXPLORATORY CELIOTOMY WITH OR WITHOUT BIOPSY(S) (SEPARATE PROCEDURE); Surgeon: Cathren Laine, MD; Location: [place]; Service: General Surgery; Laterality: N/A;  LYSIS ADHESIONS 01/25/2022  Procedure: ENTEROLYSIS (FREEING OF INTESTINAL ADHESION) (SEPARATE PROCEDURE); Surgeon: Cathren Laine, MD; Location: [place]; Service: General Surgery  [name] was admitted to the hospital on 01/25/2022 and consented by both the surgical and anesthesia staff. She was taken to the operating room and underwent the above stated procedures without complications. She was taken to the PACU for immediate recovery and later transferred to the floor for further management. In the immediate postoperative period, she continued to do well. Foley catheter was removed POD#2 and the patient was able to spontaneously void without difficulty. An NG tube remained in place until 10/5 while awaiting return of bowel function. By then she was ambulating the halls several times per day with minimal assistance and appropriate home medications were  restarted. All surgical incisions were healing well. On post-operative day 6, she was ambulating without difficulty, tolerating their diet, and pain was well controlled with oral pain pills. Therefore, once follow-up arrangements were confirmed, she was felt suitable for discharge to home. On 10/06 her PCA was stopped, mIVF were discontinued and post surgical diet was started. She tolerated diet and pain was well controlled. On 10/09 stoma with good output. PT/OT cleared patient to go home.   [name] is a 71 y.o. female with history of SBO, IBS, CAD, SVT, COPD, rectal prolapse status post diverting colostomy, and recent small bowel obstruction who presents to the emergency department for abdominal pain.  The patient states that she began having right upper quadrant pain starting at 530 this morning, which woke her from sleep.  Since then it has been constant.  It is now a 10/10 pain.  She is not been able to try eating anything as a result of the pain, though has not had any nausea or vomiting.  She emptied her colostomy bag this morning with a normal amount of output.  She did not notice any melena or hematochezia in her stool.  No fevers, urinary symptoms, or vaginal symptoms.   Abdominal Pain      Home Medications Prior to Admission medications   Medication Sig Start Date End Date Taking? Authorizing Provider  aspirin EC 81 MG tablet Take 81 mg by mouth at bedtime.    [provider]  atenolol (TENORMIN) 25 MG tablet TAKE 1/2 TABLET BY MOUTH 2 TIMES DAILY. 02/10/22   Troy Sine, MD  atorvastatin (LIPITOR) 40 MG tablet TAKE 1 TABLET BY MOUTH DAILY 11/12/21   Troy Sine, MD  baclofen (  LIORESAL) 20 MG tablet Take 20 mg by mouth 3 (three) times daily as needed for muscle spasms.     [provider]  Calcium Carbonate-Vitamin D (CALCIUM + D PO) Take 1 tablet by mouth 2 (two) times daily. Calcium Magnesium Zinc and Vitamin D    [provider]  clonazePAM  (KLONOPIN) 1 MG tablet Take 1 mg by mouth at bedtime.    [provider]  fish oil-omega-3 fatty acids 1000 MG capsule Take 1 g by mouth at bedtime.     [provider]  furosemide (LASIX) 20 MG tablet TAKE 1 TABLET BY MOUTH DAILY 11/12/21   Troy Sine, MD  METAMUCIL SMOOTH TEXTURE 28.3 % POWD  02/03/22   [provider]  Multiple Vitamin (MULTIVITAMIN WITH MINERALS) TABS tablet Take 1 tablet by mouth daily.    [provider]  omeprazole (PRILOSEC) 20 MG capsule Take 20 mg by mouth daily. 10/25/18   [provider]  verapamil (CALAN-SR) 240 MG CR tablet TAKE 1 TABLET BY MOUTH AT BEDTIME 11/12/21   Troy Sine, MD      Allergies    Bupropion hcl, Celecoxib, Naproxen, Methadone, and Morphine    Review of Systems   Review of Systems  Gastrointestinal:  Positive for abdominal pain.    Physical Exam Updated Vital Signs BP 134/64   Pulse 92   Temp 97.9 F (36.6 C)   Resp 16   SpO2 98%   Physical Exam Constitutional:      Comments: Appears uncomfortable.  HENT:     Head: Normocephalic and atraumatic.  Abdominal:     General: Abdomen is flat. Bowel sounds are normal. There is no distension.     Palpations: Abdomen is soft.     Tenderness: There is abdominal tenderness in the right upper quadrant and right lower quadrant.     Comments: Significant right-sided abdominal tenderness palpation, most significant in the right upper quadrant.  Positive Murphy sign.  Ostomy pink.  No surrounding erythema or drainage.  Midline surgical incision well-healing.  Neurological:     Mental Status: She is alert.     ED Results / Procedures / Treatments   Labs (all labs ordered are listed, but only abnormal results are displayed) Labs Reviewed  COMPREHENSIVE METABOLIC PANEL - Abnormal; Notable for the following components:      Result Value   Glucose, Bld 105 (*)    All other components within normal limits  LIPASE, BLOOD  CBC   URINALYSIS, ROUTINE W REFLEX MICROSCOPIC    EKG None  Radiology US Abdomen Limited RUQ (LIVER/GB)  Result Date: 03/09/2022 CLINICAL DATA:  Right upper quadrant pain EXAM: ULTRASOUND ABDOMEN LIMITED RIGHT UPPER QUADRANT COMPARISON:  None Available. FINDINGS: Gallbladder: Gallbladder distention. No wall thickening or gallstones. Positive sonographic Murphy sign noted by sonographer. Common bile duct: Diameter: 6.2 mm Liver: No focal lesion identified. Within normal limits in parenchymal echogenicity. Portal vein is patent on color Doppler imaging with normal direction of blood flow towards the liver. Other: None. IMPRESSION: 1. Distended gallbladder with positive sonographic Murphy sign, findings are concerning for acute cholecystitis, although there is no evidence of wall thickening or gallstones. Correlate clinically and consider HIDA scan for further evaluation. 2. Common bile duct is upper limits of normal in size. Correlate with liver function tests, if LFTs are abnormal, MRCP is recommended for further evaluation. Electronically Signed   By: Yetta Glassman M.D.   On: 03/09/2022 17:26    Procedures Procedures  {  Document cardiac monitor, telemetry assessment procedure when appropriate:1}  Medications Ordered in ED Medications - No data to display  ED Course/ Medical Decision Making/ A&P                           Medical Decision Making Amount and/or Complexity of Data Reviewed Labs: ordered. Radiology: ordered.  Risk Prescription drug management.   ***  {Document critical care time when appropriate:1} {Document review of labs and clinical decision tools ie heart score, Chads2Vasc2 etc:1}  {Document your independent review of radiology images, and any outside records:1} {Document your discussion with family members, caretakers, and with consultants:1} {Document social determinants of health affecting pt's care:1} {Document your decision making why or why not admission,  treatments were needed:1} Final Clinical Impression(s) / ED Diagnoses Final diagnoses:  None    Rx / DC Orders ED Discharge Orders     None

## 2022-03-09 NOTE — ED Notes (Signed)
IV team at bedside 

## 2022-03-10 ENCOUNTER — Emergency Department (HOSPITAL_COMMUNITY): Payer: Medicare Other

## 2022-03-10 ENCOUNTER — Inpatient Hospital Stay (HOSPITAL_COMMUNITY): Payer: Medicare Other

## 2022-03-10 ENCOUNTER — Encounter (HOSPITAL_COMMUNITY): Payer: Self-pay | Admitting: Internal Medicine

## 2022-03-10 DIAGNOSIS — K589 Irritable bowel syndrome without diarrhea: Secondary | ICD-10-CM | POA: Diagnosis present

## 2022-03-10 DIAGNOSIS — Z981 Arthrodesis status: Secondary | ICD-10-CM | POA: Diagnosis not present

## 2022-03-10 DIAGNOSIS — J439 Emphysema, unspecified: Secondary | ICD-10-CM | POA: Diagnosis present

## 2022-03-10 DIAGNOSIS — E785 Hyperlipidemia, unspecified: Secondary | ICD-10-CM | POA: Diagnosis present

## 2022-03-10 DIAGNOSIS — R1011 Right upper quadrant pain: Secondary | ICD-10-CM | POA: Diagnosis not present

## 2022-03-10 DIAGNOSIS — Z825 Family history of asthma and other chronic lower respiratory diseases: Secondary | ICD-10-CM | POA: Diagnosis not present

## 2022-03-10 DIAGNOSIS — G8929 Other chronic pain: Secondary | ICD-10-CM | POA: Diagnosis present

## 2022-03-10 DIAGNOSIS — Z87891 Personal history of nicotine dependence: Secondary | ICD-10-CM | POA: Diagnosis not present

## 2022-03-10 DIAGNOSIS — I2510 Atherosclerotic heart disease of native coronary artery without angina pectoris: Secondary | ICD-10-CM | POA: Diagnosis present

## 2022-03-10 DIAGNOSIS — F419 Anxiety disorder, unspecified: Secondary | ICD-10-CM | POA: Diagnosis present

## 2022-03-10 DIAGNOSIS — Z79899 Other long term (current) drug therapy: Secondary | ICD-10-CM | POA: Diagnosis not present

## 2022-03-10 DIAGNOSIS — M549 Dorsalgia, unspecified: Secondary | ICD-10-CM

## 2022-03-10 DIAGNOSIS — K5900 Constipation, unspecified: Secondary | ICD-10-CM | POA: Diagnosis present

## 2022-03-10 DIAGNOSIS — R109 Unspecified abdominal pain: Secondary | ICD-10-CM | POA: Diagnosis present

## 2022-03-10 DIAGNOSIS — Z885 Allergy status to narcotic agent status: Secondary | ICD-10-CM | POA: Diagnosis not present

## 2022-03-10 DIAGNOSIS — Z7982 Long term (current) use of aspirin: Secondary | ICD-10-CM | POA: Diagnosis not present

## 2022-03-10 DIAGNOSIS — Z933 Colostomy status: Secondary | ICD-10-CM

## 2022-03-10 DIAGNOSIS — I4719 Other supraventricular tachycardia: Secondary | ICD-10-CM | POA: Diagnosis present

## 2022-03-10 DIAGNOSIS — Z886 Allergy status to analgesic agent status: Secondary | ICD-10-CM | POA: Diagnosis not present

## 2022-03-10 DIAGNOSIS — Z8679 Personal history of other diseases of the circulatory system: Secondary | ICD-10-CM

## 2022-03-10 DIAGNOSIS — Z881 Allergy status to other antibiotic agents status: Secondary | ICD-10-CM | POA: Diagnosis not present

## 2022-03-10 DIAGNOSIS — Z96643 Presence of artificial hip joint, bilateral: Secondary | ICD-10-CM | POA: Diagnosis present

## 2022-03-10 LAB — MAGNESIUM: Magnesium: 2 mg/dL (ref 1.7–2.4)

## 2022-03-10 LAB — COMPREHENSIVE METABOLIC PANEL
ALT: 15 U/L (ref 0–44)
AST: 22 U/L (ref 15–41)
Albumin: 3.1 g/dL — ABNORMAL LOW (ref 3.5–5.0)
Alkaline Phosphatase: 69 U/L (ref 38–126)
Anion gap: 8 (ref 5–15)
BUN: 11 mg/dL (ref 8–23)
CO2: 26 mmol/L (ref 22–32)
Calcium: 9.4 mg/dL (ref 8.9–10.3)
Chloride: 107 mmol/L (ref 98–111)
Creatinine, Ser: 0.75 mg/dL (ref 0.44–1.00)
GFR, Estimated: >60 mL/min (ref >60)
Glucose, Bld: 116 mg/dL — ABNORMAL HIGH (ref 70–99)
Potassium: 3.6 mmol/L (ref 3.5–5.1)
Sodium: 141 mmol/L (ref 135–145)
Total Bilirubin: 0.5 mg/dL (ref 0.3–1.2)
Total Protein: 6 g/dL — ABNORMAL LOW (ref 6.5–8.1)

## 2022-03-10 LAB — CBC WITH DIFFERENTIAL/PLATELET
Abs Immature Granulocytes: 0.01 10*3/uL (ref 0.00–0.07)
Basophils Absolute: 0 10*3/uL (ref 0.0–0.1)
Basophils Relative: 1 %
Eosinophils Absolute: 0.1 10*3/uL (ref 0.0–0.5)
Eosinophils Relative: 3 %
HCT: 39.1 % (ref 36.0–46.0)
Hemoglobin: 12.1 g/dL (ref 12.0–15.0)
Immature Granulocytes: 0 %
Lymphocytes Relative: 39 %
Lymphs Abs: 1.8 10*3/uL (ref 0.7–4.0)
MCH: 30.6 pg (ref 26.0–34.0)
MCHC: 30.9 g/dL (ref 30.0–36.0)
MCV: 99 fL (ref 80.0–100.0)
Monocytes Absolute: 0.6 10*3/uL (ref 0.1–1.0)
Monocytes Relative: 13 %
Neutro Abs: 2 10*3/uL (ref 1.7–7.7)
Neutrophils Relative %: 44 %
Platelets: 327 10*3/uL (ref 150–400)
RBC: 3.95 MIL/uL (ref 3.87–5.11)
RDW: 12.9 % (ref 11.5–15.5)
WBC: 4.6 10*3/uL (ref 4.0–10.5)
nRBC: 0 % (ref 0.0–0.2)

## 2022-03-10 MED ORDER — PIPERACILLIN-TAZOBACTAM 3.375 G IVPB 30 MIN
3.3750 g | Freq: Once | INTRAVENOUS | Status: AC
  Administered 2022-03-10: 3.375 g via INTRAVENOUS
  Filled 2022-03-10: qty 50

## 2022-03-10 MED ORDER — PROCHLORPERAZINE EDISYLATE 10 MG/2ML IJ SOLN
10.0000 mg | Freq: Once | INTRAMUSCULAR | Status: AC
  Administered 2022-03-10: 10 mg via INTRAVENOUS
  Filled 2022-03-10: qty 2

## 2022-03-10 MED ORDER — HEPARIN SODIUM (PORCINE) 5000 UNIT/ML IJ SOLN
5000.0000 [IU] | Freq: Three times a day (TID) | INTRAMUSCULAR | Status: DC
  Administered 2022-03-10 – 2022-03-13 (×8): 5000 [IU] via SUBCUTANEOUS
  Filled 2022-03-10 (×8): qty 1

## 2022-03-10 MED ORDER — LACTATED RINGERS IV SOLN: INTRAVENOUS | Status: DC

## 2022-03-10 MED ORDER — LORAZEPAM 2 MG/ML IJ SOLN: 1.0000 mg | Freq: Once | INTRAMUSCULAR | Status: DC | PRN

## 2022-03-10 MED ORDER — OXYCODONE HCL 5 MG PO TABS: 5.0000 mg | ORAL_TABLET | ORAL | Status: DC | PRN

## 2022-03-10 MED ORDER — HYDROMORPHONE HCL 1 MG/ML IJ SOLN
1.0000 mg | Freq: Once | INTRAMUSCULAR | Status: AC
  Administered 2022-03-10: 1 mg via INTRAVENOUS
  Filled 2022-03-10: qty 1

## 2022-03-10 MED ORDER — ATENOLOL 25 MG PO TABS
12.5000 mg | ORAL_TABLET | Freq: Two times a day (BID) | ORAL | Status: DC
  Administered 2022-03-10 – 2022-03-13 (×7): 12.5 mg via ORAL
  Filled 2022-03-10 (×7): qty 1

## 2022-03-10 MED ORDER — VERAPAMIL HCL ER 240 MG PO TBCR
240.0000 mg | EXTENDED_RELEASE_TABLET | Freq: Every day | ORAL | Status: DC
  Administered 2022-03-10 – 2022-03-12 (×4): 240 mg via ORAL
  Filled 2022-03-10 (×4): qty 1

## 2022-03-10 MED ORDER — HYDROMORPHONE HCL 1 MG/ML IJ SOLN
0.5000 mg | INTRAMUSCULAR | Status: DC | PRN
  Administered 2022-03-10 – 2022-03-13 (×10): 1 mg via INTRAVENOUS
  Filled 2022-03-10 (×10): qty 1

## 2022-03-10 MED ORDER — ACETAMINOPHEN 325 MG PO TABS: 650.0000 mg | ORAL_TABLET | Freq: Four times a day (QID) | ORAL | Status: DC | PRN

## 2022-03-10 MED ORDER — TECHNETIUM TC 99M MEBROFENIN IV KIT
5.0000 | PACK | Freq: Once | INTRAVENOUS | Status: AC | PRN
  Administered 2022-03-10: 5 via INTRAVENOUS

## 2022-03-10 MED ORDER — PROCHLORPERAZINE EDISYLATE 10 MG/2ML IJ SOLN
10.0000 mg | Freq: Four times a day (QID) | INTRAMUSCULAR | Status: DC | PRN
  Administered 2022-03-10 – 2022-03-12 (×6): 10 mg via INTRAVENOUS
  Filled 2022-03-10 (×6): qty 2

## 2022-03-10 MED ORDER — ACETAMINOPHEN 650 MG RE SUPP: 650.0000 mg | Freq: Four times a day (QID) | RECTAL | Status: DC | PRN

## 2022-03-10 MED ORDER — PIPERACILLIN-TAZOBACTAM 3.375 G IVPB
3.3750 g | Freq: Three times a day (TID) | INTRAVENOUS | Status: DC
  Administered 2022-03-10 – 2022-03-12 (×6): 3.375 g via INTRAVENOUS
  Filled 2022-03-10 (×6): qty 50

## 2022-03-10 MED ORDER — ATORVASTATIN CALCIUM 40 MG PO TABS
40.0000 mg | ORAL_TABLET | Freq: Every day | ORAL | Status: DC
  Administered 2022-03-10 – 2022-03-13 (×4): 40 mg via ORAL
  Filled 2022-03-10 (×4): qty 1

## 2022-03-10 MED ORDER — BACLOFEN 10 MG PO TABS
20.0000 mg | ORAL_TABLET | Freq: Every day | ORAL | Status: DC
  Administered 2022-03-10 – 2022-03-12 (×4): 20 mg via ORAL
  Filled 2022-03-10 (×4): qty 2

## 2022-03-10 MED ORDER — PANTOPRAZOLE SODIUM 40 MG PO TBEC
40.0000 mg | DELAYED_RELEASE_TABLET | Freq: Every day | ORAL | Status: DC
  Administered 2022-03-10 – 2022-03-13 (×4): 40 mg via ORAL
  Filled 2022-03-10 (×4): qty 1

## 2022-03-10 MED ORDER — CLONAZEPAM 1 MG PO TABS
1.0000 mg | ORAL_TABLET | Freq: Every day | ORAL | Status: DC
  Administered 2022-03-10 – 2022-03-12 (×3): 1 mg via ORAL
  Filled 2022-03-10 (×3): qty 1

## 2022-03-10 MED ORDER — IOHEXOL 350 MG/ML SOLN
75.0000 mL | Freq: Once | INTRAVENOUS | Status: AC | PRN
  Administered 2022-03-10: 75 mL via INTRAVENOUS

## 2022-03-10 NOTE — Assessment & Plan Note (Signed)
Chronic. Continue with po baclofen.

## 2022-03-10 NOTE — ED Notes (Signed)
Dr Candise Che notified of patients allergy to morphine at this time as this medication was supposed to be verbally ordered by this RN via phone call for hida scan where the patient is at currently

## 2022-03-10 NOTE — Progress Notes (Signed)
Pharmacy Antibiotic Note  [name] is a 71 y.o. female admitted on 03/09/2022 with  concern for intra-abdominal infection .  Pharmacy has been consulted for Zosyn dosing.  Plan: Zosyn 3.375g IV q8h (4 hour infusion).  Temp (24hrs), Avg:98.2 F (36.8 C), Min:97.9 F (36.6 C), Max:98.5 F (36.9 C)  Recent Labs  Lab 03/09/22 1515  WBC 6.3  CREATININE 0.80    Estimated Creatinine Clearance: 54.4 mL/min (by C-G formula based on SCr of 0.8 mg/dL).    Allergies  Allergen Reactions   Bupropion Hcl Swelling   Celecoxib Swelling   Naproxen Swelling   Methadone Nausea And Vomiting   Morphine Itching    Thank you for allowing pharmacy to be a part of this patient's care.  Wynona Neat, PharmD, BCPS  03/10/2022 2:41 AM

## 2022-03-10 NOTE — Assessment & Plan Note (Signed)
Stable. Continue lipitor 40 mg

## 2022-03-10 NOTE — Assessment & Plan Note (Signed)
Stable. Was placed due to fecal incontinence and rectal prolapse.

## 2022-03-10 NOTE — ED Notes (Signed)
Back from scan at this time

## 2022-03-10 NOTE — Subjective & Objective (Signed)
CC: RUQ pain HPI: 71 yo WF with a history of paroxysmal SVT, hyperlipidemia, chronic back pain, recent colostomy due to fecal incontinence and rectal prolapse, recent [facility] hospital admission for closed-loop small bowel obstruction status post lysis of adhesions present to the ER today with a 24-hour history of right upper quadrant pain.  She states that she started having right upper quadrant pain on Monday morning around 5 AM.  Increasing pain.  Nauseated but no vomiting.  No fevers.  No chills.  No prior history of biliary colic.  Came to the ER for evaluation.  On arrival temp 90.5 heart rate 90 blood pressure 151/64 satting 99% on room air.  Labs White count 6.3, human 13.2, platelet 3 3  CMP within normal limits Lipase 30 UA negative  Right upper quadrant ultrasound demonstrated distended gallbladder with no stones or no wall thickening.  CT abdomen pelvis demonstrated gallbladder wall fundus thickening suggestive of adenomyomatosis.  They recommended MRCP.  EDP discussed case with general surgery who recommended hospitalist admission and they will see in consult.

## 2022-03-10 NOTE — ED Notes (Signed)
Patient still in scan at this time. Family member provided with update and a coffee

## 2022-03-10 NOTE — ED Notes (Signed)
Patient ambulating to rr.

## 2022-03-10 NOTE — Assessment & Plan Note (Signed)
Observation med/tele bed. EDP has discussed with general surgery. Keep NPO. On IV zosyn. IV dilaudid 1 mg x 1 for pain. Start po oxycodone 5 mg q4h prn moderate pain and 1 mg IV dilaudid prn for severe pain. LR @ 100 ml/hr.

## 2022-03-10 NOTE — H&P (Signed)
History and Physical    [name] MVH:[id number] DOB: [birth year] DOA: 03/09/2022  DOS: the patient was seen and examined on 03/09/2022  PCP: Raina Mina., MD   Patient coming from: Home  I have personally briefly reviewed patient's old medical records in [place]  CC: RUQ pain HPI: 71 yo WF with a history of paroxysmal SVT, hyperlipidemia, chronic back pain, recent colostomy due to fecal incontinence and rectal prolapse, recent [facility] hospital admission for closed-loop small bowel obstruction status post lysis of adhesions present to the ER today with a 24-hour history of right upper quadrant pain.  She states that she started having right upper quadrant pain on Monday morning around 5 AM.  Increasing pain.  Nauseated but no vomiting.  No fevers.  No chills.  No prior history of biliary colic.  Came to the ER for evaluation.  On arrival temp 90.5 heart rate 90 blood pressure 151/64 satting 99% on room air.  Labs White count 6.3, human 13.2, platelet 3 3  CMP within normal limits Lipase 30 UA negative  Right upper quadrant ultrasound demonstrated distended gallbladder with no stones or no wall thickening.  CT abdomen pelvis demonstrated gallbladder wall fundus thickening suggestive of adenomyomatosis.  They recommended MRCP.  EDP discussed case with general surgery who recommended hospitalist admission and they will see in consult.   ED Course: Right a quadrant ultrasound negative for gallstones.  Review of Systems:  Review of Systems  Constitutional: Negative.   HENT: Negative.    Eyes: Negative.   Respiratory: Negative.    Cardiovascular: Negative.   Gastrointestinal:  Positive for abdominal pain and nausea.  Genitourinary: Negative.   Musculoskeletal:  Positive for back pain.       Chronic back pain  Neurological: Negative.   Endo/Heme/Allergies: Negative.   Psychiatric/Behavioral: Negative.    All other systems reviewed and are negative.   Past  Medical History:  Diagnosis Date   Anxiety    Back pain, chronic    Bowel obstruction (HCC)    Complication of anesthesia    PT HAS POST OP URINARY RETENTION:  per patient "difficult stick- needs central line or picc"  04-06-2017 AT [facility] IV STARTED BY CRNA NO ISSUE   COPD with emphysema ([place])    DDD (degenerative disc disease), cervical    POST MULTIPLE SURGERY'S   DDD (degenerative disc disease), lumbosacral    POST MULTIPLE SURGERY'S   Difficult intravenous access    Fecal incontinence    once a week----  TREATED W/ SACRAL NERVE STIMULATOR PLACED 12/ 2018   Heart murmur    History of chronic bronchitis    History of spinal cord injury 02/09/2007   PT FELL-- S/P  CERVICAL SPINE SURGERY   History of urinary retention    PT HAS POST OP URINARY RETENTION DUE TO ANESTHESIA   History of venomous snake bite 2013   RIGHT MIDDLE FINGER BY COOPERHEAD--- TREATMENT W/ ANTIVENOM   Hyperlipemia    IBS (irritable bowel syndrome)    flairs up occasionally esp if stressed   Incomplete right bundle branch block    Major depression    Mild carotid artery disease [facility]) cardiologist-  dr Claiborne Billings   mild bilateral ICA per duplex 04-12-2017  1-39%   MVP (mitral valve prolapse)    no evidence stenosis or regurgitation per last echo 10-25-2015   OA (osteoarthritis)    PONV (postoperative nausea and vomiting)    Pre-diabetes    PSVT (paroxysmal supraventricular tachycardia)  cardiologist-- dr Claiborne Billings   SBO (small bowel obstruction) ([place]) 01/08/2019   SVT (supraventricular tachycardia) ([place]) 02/27/2014    Past Surgical History:  Procedure Laterality Date   ABDOMINAL RECTOPEXY  2011       dr fuller  [facility])   prolapse rectum   ANAL RECTAL MANOMETRY N/A 03/01/2017   Procedure: ANO RECTAL MANOMETRY;  Surgeon: Leighton Ruff, MD;  Location: [facility];  Service: Endoscopy;  Laterality: N/A;   ANTERIOR CERVICAL DECOMP/DISCECTOMY FUSION  05-28-2000    dr Louanne Skye   C3-4  &  C7-T1   ANTERIOR  CERVICAL DECOMPRESSION LAMINECTOMY AND FUSION  02-09-2007    dr Arnoldo Morale   decompression/ laminectomy C3-4 & C5-6;  laminectomy C2; fusion C2-5   BREAST SURGERY  1976   breast reduction   COLONOSCOPY     FORAMINOTOMY 1 LEVEL  11-03-1999   dr Louanne Skye  [facility]   left C7-T1 w/ decompression left C8   KNEE SURGERY  AGE 69   bone spur     LUMBAR LAMINECTOMY/ DECOMPRESSION WITH MET-RX  02-09-2005  dr Flavia Shipper   L2-3 laminectomy left cage; fusion L2-4   POSTERIOR CERVICAL FUSION/FORAMINOTOMY  12-08-2000    dr Louanne Skye   right forminotomy C7-T1 and posterior fusion   RIGHT THUMB COLLATERAL LIGAMENT RECONSTRUCTION  02-08-2003   dr Amedeo Plenty   RIGHT THUMB LIGAMENT REPAIR, ARTHROTOMY, SYNOVECTOMY  10-26-2002    dr Amedeo Plenty   SPINE SURGERY  1983 to 01/2007   total 14 surgery's  cervical and lumbar  (including fusion's, diskectomy, laminectomy's, foraminotomy's)   TONSILLECTOMY     TOTAL HIP ARTHROPLASTY Right 07/31/2014   Procedure: TOTAL HIP ARTHROPLASTY ANTERIOR APPROACH;  Surgeon: Renette Butters, MD;  Location: [place];  Service: Orthopedics;  Laterality: Right;   TOTAL HIP ARTHROPLASTY Left 11/19/2015   Procedure: TOTAL HIP ARTHROPLASTY ANTERIOR APPROACH;  Surgeon: Renette Butters, MD;  Location: [place];  Service: Orthopedics;  Laterality: Left;   TRANSTHORACIC ECHOCARDIOGRAM  10-25-2015   dr t. Claiborne Billings   ef 60-655/  no evidence MV stenosis or regurgitation/     TUBAL LIGATION       reports that she quit smoking about 4 years ago. Her smoking use included cigarettes. She has a 31.50 pack-year smoking history. She has never used smokeless tobacco. She reports that she does not currently use alcohol after a past usage of about 5.0 - 6.0 standard drinks of alcohol per week. She reports that she does not use drugs.  Allergies  Allergen Reactions   Bupropion Hcl Swelling   Celecoxib Swelling   Naproxen Swelling   Methadone Nausea And Vomiting   Morphine Itching    Family History  Problem Relation Age of Onset    COPD Father    Diabetes Mother    Heart disease Mother    Kidney disease Mother    Hypertension Mother     Prior to Admission medications   Medication Sig Start Date End Date Taking? Authorizing Provider  aspirin EC 81 MG tablet Take 81 mg by mouth at bedtime.    [provider]  atenolol (TENORMIN) 25 MG tablet TAKE 1/2 TABLET BY MOUTH 2 TIMES DAILY. Patient taking differently: Take 12.5 mg by mouth 2 (two) times daily. 02/10/22   Troy Sine, MD  atorvastatin (LIPITOR) 40 MG tablet TAKE 1 TABLET BY MOUTH DAILY 11/12/21   Troy Sine, MD  baclofen (LIORESAL) 20 MG tablet Take 20 mg by mouth 3 (three) times daily as needed for muscle spasms.  [provider]  Calcium Carbonate-Vitamin D (CALCIUM + D PO) Take 1 tablet by mouth 2 (two) times daily. Calcium Magnesium Zinc and Vitamin D    [provider]  clonazePAM (KLONOPIN) 1 MG tablet Take 1 mg by mouth at bedtime.    [provider]  fish oil-omega-3 fatty acids 1000 MG capsule Take 1 g by mouth at bedtime.     [provider]  furosemide (LASIX) 20 MG tablet TAKE 1 TABLET BY MOUTH DAILY 11/12/21   Troy Sine, MD  METAMUCIL SMOOTH TEXTURE 28.3 % POWD  02/03/22   [provider]  Multiple Vitamin (MULTIVITAMIN WITH MINERALS) TABS tablet Take 1 tablet by mouth daily.    [provider]  omeprazole (PRILOSEC) 20 MG capsule Take 20 mg by mouth daily. 10/25/18   [provider]  verapamil (CALAN-SR) 240 MG CR tablet TAKE 1 TABLET BY MOUTH AT BEDTIME Patient taking differently: Take 240 mg by mouth at bedtime. 11/12/21   Troy Sine, MD    Physical Exam: Vitals:   03/09/22 2230 03/09/22 2300 03/10/22 0000 03/10/22 0015  BP: (!) 121/53 128/65 (!) 116/53 (!) 113/54  Pulse: 95 92 89 83  Resp:    16  Temp:    98.2 F (36.8 C)  SpO2: 99% 99% 98% 100%    Physical Exam Vitals and nursing note reviewed.  Constitutional:      General: She is not in acute  distress.    Appearance: Normal appearance. She is not ill-appearing, toxic-appearing or diaphoretic.  HENT:     Head: Normocephalic and atraumatic.     Nose: Nose normal.  Cardiovascular:     Rate and Rhythm: Normal rate and regular rhythm.  Pulmonary:     Effort: Pulmonary effort is normal. No respiratory distress.     Breath sounds: No wheezing.  Abdominal:     General: Bowel sounds are normal.     Palpations: Abdomen is soft.     Tenderness: There is abdominal tenderness in the right upper quadrant.     Comments: +LLQ colostomy. Stool in colostomy bag  Musculoskeletal:     Right lower leg: No edema.     Left lower leg: No edema.  Skin:    General: Skin is warm and dry.     Capillary Refill: Capillary refill takes less than 2 seconds.  Neurological:     General: No focal deficit present.     Mental Status: She is alert and oriented to person, place, and time.      Labs on Admission: I have personally reviewed following labs and imaging studies  CBC: Recent Labs  Lab 03/09/22 1515  WBC 6.3  HGB 13.2  HCT 43.7  MCV 99.3  PLT 675   Basic Metabolic Panel: Recent Labs  Lab 03/09/22 1515  NA 141  K 3.7  CL 105  CO2 25  GLUCOSE 105*  BUN 14  CREATININE 0.80  CALCIUM 9.4   GFR: Estimated Creatinine Clearance: 54.4 mL/min (by C-G formula based on SCr of 0.8 mg/dL). Liver Function Tests: Recent Labs  Lab 03/09/22 1515  AST 27  ALT 17  ALKPHOS 89  BILITOT 0.5  PROT 6.9  ALBUMIN 3.7   Recent Labs  Lab 03/09/22 1515  LIPASE 30   No results for input(s): "AMMONIA" in the last 168 hours. Coagulation Profile: No results for input(s): "INR", "PROTIME" in the last 168 hours. Cardiac Enzymes: No results for input(s): "CKTOTAL", "CKMB", "CKMBINDEX", "TROPONINI", "TROPONINIHS" in  the last 168 hours. BNP (last 3 results) No results for input(s): "PROBNP" in the last 8760 hours. HbA1C: No results for input(s): "HGBA1C" in the last 72 hours. CBG: No results  for input(s): "GLUCAP" in the last 168 hours. Lipid Profile: No results for input(s): "CHOL", "HDL", "LDLCALC", "TRIG", "CHOLHDL", "LDLDIRECT" in the last 72 hours. Thyroid Function Tests: No results for input(s): "TSH", "T4TOTAL", "FREET4", "T3FREE", "THYROIDAB" in the last 72 hours. Anemia Panel: No results for input(s): "VITAMINB12", "FOLATE", "FERRITIN", "TIBC", "IRON", "RETICCTPCT" in the last 72 hours. Urine analysis:    Component Value Date/Time   COLORURINE YELLOW 03/09/2022 [place] 03/09/2022 1344   LABSPEC 1.016 03/09/2022 1344   PHURINE 6.0 03/09/2022 1344   GLUCOSEU NEGATIVE 03/09/2022 1344   HGBUR NEGATIVE 03/09/2022 1344   BILIRUBINUR NEGATIVE 03/09/2022 1344   KETONESUR NEGATIVE 03/09/2022 1344   PROTEINUR NEGATIVE 03/09/2022 1344   UROBILINOGEN 0.2 07/18/2014 1115   NITRITE NEGATIVE 03/09/2022 1344   LEUKOCYTESUR NEGATIVE 03/09/2022 1344    Radiological Exams on Admission: I have personally reviewed images CT ABDOMEN PELVIS W CONTRAST  Result Date: 03/10/2022 CLINICAL DATA:  Bowel obstruction suspected RUQ pain, possible chole, h/o SBO EXAM: CT ABDOMEN AND PELVIS WITH CONTRAST TECHNIQUE: Multidetector CT imaging of the abdomen and pelvis was performed using the standard protocol following bolus administration of intravenous contrast. RADIATION DOSE REDUCTION: This exam was performed according to the departmental dose-optimization program which includes automated exposure control, adjustment of the mA and/or kV according to patient size and/or use of iterative reconstruction technique. CONTRAST:  81m OMNIPAQUE IOHEXOL 350 MG/ML SOLN COMPARISON:  CT abdomen pelvis 01/07/2019, ultrasound abdomen 03/26/2022 FINDINGS: Lower chest: No acute abnormality. Hepatobiliary: No focal liver abnormality. Redemonstration of gallbladder fundus wall thickening. No gallstones or pericholecystic fluid. No biliary dilatation. Pancreas: No focal lesion. Normal pancreatic  contour. No surrounding inflammatory changes. No main pancreatic ductal dilatation. Spleen: Normal in size without focal abnormality. Adrenals/Urinary Tract: No adrenal nodule bilaterally. Bilateral kidneys enhance symmetrically. No hydronephrosis. No hydroureter. The urinary bladder is unremarkable. Stomach/Bowel: Left lower quadrant diverting colostomy formation. Stomach is within normal limits. No evidence of bowel wall thickening or dilatation. The appendix is not definitely identified with no inflammatory changes in the right lower quadrant to suggest acute appendicitis. Vascular/Lymphatic: No abdominal aorta or iliac aneurysm. Severe atherosclerotic plaque of the aorta and its branches. No abdominal, pelvic, or inguinal lymphadenopathy. Reproductive: Atrophic uterus versus surgically removed. No adnexal masses. Other: No intraperitoneal free fluid. No intraperitoneal free gas. No organized fluid collection. Musculoskeletal: No abdominal wall hernia or abnormality. No suspicious lytic or blastic osseous lesions. No acute displaced fracture. L2 through L4 posterolateral fusion surgical hardware. Multilevel severe degenerative changes of the spine. There is L1 on L2 and L2 on L3 mild retrolisthesis. Grade 1 anterolisthesis of L3 on L4. Bilateral total hip arthroplasties partially visualized. IMPRESSION: 1. 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 Finding can be further evaluated on MRCP recommend ultrasound abdomen 03/09/2022. 2. Otherwise no acute intra-abdominal or intrapelvic abnormality in a patient with a left lower quadrant diverting colostomy formation. 3.  Aortic Atherosclerosis (ICD10-I70.0). Electronically Signed   By: MIven FinnM.D.   On: 03/10/2022 01:09   [place]domen Limited RUQ (LIVER/GB)  Result Date: 03/09/2022 CLINICAL DATA:  Right upper quadrant pain EXAM: ULTRASOUND ABDOMEN LIMITED RIGHT UPPER QUADRANT COMPARISON:  None Available. FINDINGS:  Gallbladder: Gallbladder distention. No wall thickening or gallstones. Positive sonographic Murphy sign noted by sonographer. Common bile duct:  Diameter: 6.2 mm Liver: No focal lesion identified. Within normal limits in parenchymal echogenicity. Portal vein is patent on color Doppler imaging with normal direction of blood flow towards the liver. Other: None. IMPRESSION: 1. Distended gallbladder with positive sonographic Murphy sign, findings are concerning for acute cholecystitis, although there is no evidence of wall thickening or gallstones. Correlate clinically and consider HIDA scan for further evaluation. 2. Common bile duct is upper limits of normal in size. Correlate with liver function tests, if LFTs are abnormal, MRCP is recommended for further evaluation. Electronically Signed   By: Yetta Glassman M.D.   On: 03/09/2022 17:26    EKG: My personal interpretation of EKG shows: no EKG to review  Assessment/Plan Principal Problem:   RUQ pain Active Problems:   Hyperlipidemia   History of PSVT (paroxysmal supraventricular tachycardia)   S/P colostomy (HCC)   Chronic back pain    Assessment and Plan: * RUQ pain Observation med/tele bed. EDP has discussed with general surgery. Keep NPO. On IV zosyn. IV dilaudid 1 mg x 1 for pain. Start po oxycodone 5 mg q4h prn moderate pain and 1 mg IV dilaudid prn for severe pain. LR @ 100 ml/hr.  Chronic back pain Chronic. Continue with po baclofen.  S/P colostomy ([place]) Stable. Was placed due to fecal incontinence and rectal prolapse.  History of PSVT (paroxysmal supraventricular tachycardia) Chronic. On atenolol and verapamil.  Hyperlipidemia Stable. Continue lipitor 40 mg   DVT prophylaxis: SQ Heparin Code Status: Full Code Family Communication: discussed with pt and her husband carroll at bedside  Disposition Plan: return home  Consults called: EDP has discussed with on-call general surgery(Paul Stechschulte)  Admission status:  Observation, Telemetry bed   Kristopher Oppenheim, DO [facility] 03/10/2022, 1:58 AM

## 2022-03-10 NOTE — Assessment & Plan Note (Signed)
Chronic. On atenolol and verapamil.

## 2022-03-10 NOTE — ED Notes (Signed)
Patient in scan at this Rns time of arrival on shift

## 2022-03-10 NOTE — Consult Note (Signed)
Consulting Physician: Nickola Major Meriel Kelliher  Referring Provider: Dr. Bridgett Larsson  Chief Complaint: RUQ pain  Reason for Consult: RUQ pain   Subjective   HPI: [name] is an 71 y.o. female who is here for right upper quadrant pain.  She recently underwent ostomy with removal of sacral nerve stimulator at [facility] in August this year.  In October this year, at [facility], she underwent exploratory laparotomy with lysis of adhesions for a small bowel obstruction.  Since the weekend she has noticed a severe right upper quadrant abdominal pain.  She can't say she ate anything particularly fatty prior to the pain starting.  She has never had pains like this before.  She does not take any NSAIDs, just an 81 mg aspirin daily.  She does not smoke or use alcohol.  She has had to take some medications to keep her bowels more regular - laxatives and fiber depending on the consistency, but the bowels have been moving recently.  She does have some formed stool in the bag.  Past Medical History:  Diagnosis Date   Anxiety    Back pain, chronic    Bowel obstruction (HCC)    Complication of anesthesia    PT HAS POST OP URINARY RETENTION:  per patient "difficult stick- needs central line or picc"  04-06-2017 AT [facility] IV STARTED BY CRNA NO ISSUE   COPD with emphysema ([place])    DDD (degenerative disc disease), cervical    POST MULTIPLE SURGERY'S   DDD (degenerative disc disease), lumbosacral    POST MULTIPLE SURGERY'S   Difficult intravenous access    Fecal incontinence    once a week----  TREATED W/ SACRAL NERVE STIMULATOR PLACED 12/ 2018   Heart murmur    History of chronic bronchitis    History of spinal cord injury 02/09/2007   PT FELL-- S/P  CERVICAL SPINE SURGERY   History of urinary retention    PT HAS POST OP URINARY RETENTION DUE TO ANESTHESIA   History of venomous snake bite 2013   RIGHT MIDDLE FINGER BY COOPERHEAD--- TREATMENT W/ ANTIVENOM   Hyperlipemia    IBS (irritable bowel syndrome)     flairs up occasionally esp if stressed   Incomplete right bundle branch block    Major depression    Mild carotid artery disease [facility]) cardiologist-  dr Claiborne Billings   mild bilateral ICA per duplex 04-12-2017  1-39%   MVP (mitral valve prolapse)    no evidence stenosis or regurgitation per last echo 10-25-2015   OA (osteoarthritis)    PONV (postoperative nausea and vomiting)    Pre-diabetes    PSVT (paroxysmal supraventricular tachycardia)    cardiologist-- dr Claiborne Billings   SBO (small bowel obstruction) ([place]) 01/08/2019   SVT (supraventricular tachycardia) ([place]) 02/27/2014    Past Surgical History:  Procedure Laterality Date   ABDOMINAL RECTOPEXY  2011       dr fuller  [facility])   prolapse rectum   ANAL RECTAL MANOMETRY N/A 03/01/2017   Procedure: ANO RECTAL MANOMETRY;  Surgeon: Leighton Ruff, MD;  Location: [facility];  Service: Endoscopy;  Laterality: N/A;   ANTERIOR CERVICAL DECOMP/DISCECTOMY FUSION  05-28-2000    dr Louanne Skye   C3-4  &  C7-T1   ANTERIOR CERVICAL DECOMPRESSION LAMINECTOMY AND FUSION  02-09-2007    dr Arnoldo Morale   decompression/ laminectomy C3-4 & C5-6;  laminectomy C2; fusion C2-5   BREAST SURGERY  1976   breast reduction   COLONOSCOPY     FORAMINOTOMY 1 LEVEL  11-03-1999   dr Louanne Skye  [facility]   left C7-T1 w/ decompression left C8   KNEE SURGERY  AGE 71   bone spur     LUMBAR LAMINECTOMY/ DECOMPRESSION WITH MET-RX  02-09-2005  dr Flavia Shipper   L2-3 laminectomy left cage; fusion L2-4   POSTERIOR CERVICAL FUSION/FORAMINOTOMY  12-08-2000    dr Louanne Skye   right forminotomy C7-T1 and posterior fusion   RIGHT THUMB COLLATERAL LIGAMENT RECONSTRUCTION  02-08-2003   dr Amedeo Plenty   RIGHT THUMB LIGAMENT REPAIR, ARTHROTOMY, SYNOVECTOMY  10-26-2002    dr Amedeo Plenty   SPINE SURGERY  1983 to 01/2007   total 14 surgery's  cervical and lumbar  (including fusion's, diskectomy, laminectomy's, foraminotomy's)   TONSILLECTOMY     TOTAL HIP ARTHROPLASTY Right 07/31/2014   Procedure: TOTAL HIP ARTHROPLASTY  ANTERIOR APPROACH;  Surgeon: Renette Butters, MD;  Location: [place];  Service: Orthopedics;  Laterality: Right;   TOTAL HIP ARTHROPLASTY Left 11/19/2015   Procedure: TOTAL HIP ARTHROPLASTY ANTERIOR APPROACH;  Surgeon: Renette Butters, MD;  Location: [place];  Service: Orthopedics;  Laterality: Left;   TRANSTHORACIC ECHOCARDIOGRAM  10-25-2015   dr t. Claiborne Billings   ef 60-655/  no evidence MV stenosis or regurgitation/     TUBAL LIGATION      Family History  Problem Relation Age of Onset   COPD Father    Diabetes Mother    Heart disease Mother    Kidney disease Mother    Hypertension Mother     Social:  reports that she quit smoking about 4 years ago. Her smoking use included cigarettes. She has a 31.50 pack-year smoking history. She has never used smokeless tobacco. She reports that she does not currently use alcohol after a past usage of about 5.0 - 6.0 standard drinks of alcohol per week. She reports that she does not use drugs.  Allergies:  Allergies  Allergen Reactions   Bupropion Hcl Swelling   Celecoxib Swelling   Naproxen Swelling   Methadone Nausea And Vomiting   Morphine Itching    Medications: Current Outpatient Medications  Medication Instructions   aspirin EC 81 mg, Oral, Daily at bedtime   atenolol (TENORMIN) 25 MG tablet TAKE 1/2 TABLET BY MOUTH 2 TIMES DAILY.   atorvastatin (LIPITOR) 40 mg, Oral, Daily   baclofen (LIORESAL) 20 mg, Oral, See admin instructions, Take 1 tablet by mouth every 6 to 8 hours as needed for muscle spasms   Calcium Carbonate-Vitamin D (CALCIUM + D PO) 1 tablet, Oral, 2 times daily, Calcium Magnesium Zinc and Vitamin D   clonazePAM (KLONOPIN) 1 mg, Oral, Daily at bedtime, For sleep   fish oil-omega-3 fatty acids 1 g, Oral, Daily at bedtime   furosemide (LASIX) 20 mg, Oral, Daily   HYDROcodone-acetaminophen (NORCO) 10-325 MG tablet 1 tablet, Oral, Every 6 hours PRN   Multiple Vitamin (MULTIVITAMIN WITH MINERALS) TABS tablet 1 tablet, Daily    omeprazole (PRILOSEC) 20 mg, Oral, Daily   polyethylene glycol (MIRALAX / GLYCOLAX) 17 g, Oral, Daily PRN   Psyllium (METAMUCIL SMOOTH TEXTURE PO) 1 packet, Oral, 2 times daily, Mix with 8 ounces of water   verapamil (CALAN-SR) 240 MG CR tablet TAKE 1 TABLET BY MOUTH AT BEDTIME   Vitamin D3 5,000 Units, Oral, Daily    ROS - all of the below systems have been reviewed with the patient and positives are indicated with bold text General: chills, fever or night sweats Eyes: blurry vision or double vision ENT: epistaxis or sore throat Allergy/Immunology:  itchy/watery eyes or nasal congestion Hematologic/Lymphatic: bleeding problems, blood clots or swollen lymph nodes Endocrine: temperature intolerance or unexpected weight changes Breast: new or changing breast lumps or nipple discharge Resp: cough, shortness of breath, or wheezing CV: chest pain or dyspnea on exertion GI: as per HPI GU: dysuria, trouble voiding, or hematuria MSK: joint pain or joint stiffness Neuro: TIA or stroke symptoms Derm: pruritus and skin lesion changes Psych: anxiety and depression  Objective   PE Blood pressure (!) 128/46, pulse 85, temperature 98.2 F (36.8 C), resp. rate 16, SpO2 100 %. Constitutional: NAD; conversant; no deformities Eyes: Moist conjunctiva; no lid lag; anicteric; PERRL Neck: Trachea midline; no thyromegaly Lungs: Normal respiratory effort; no tactile fremitus CV: RRR; no palpable thrills; no pitting edema GI: Abd Lower midline incision healing well.  Ostomy with formed stool in bag, RUQ severe tenderness MSK: Normal range of motion of extremities; no clubbing/cyanosis Psychiatric: Appropriate affect; alert and oriented x3 Lymphatic: No palpable cervical or axillary lymphadenopathy  Results for orders placed or performed during the hospital encounter of 03/09/22 (from the past 24 hour(s))  Urinalysis, Routine w reflex microscopic     Status: None   Collection Time: 03/09/22  1:44 PM   Result Value Ref Range   Color, Urine YELLOW YELLOW   APPearance CLEAR CLEAR   Specific Gravity, Urine 1.016 1.005 - 1.030   pH 6.0 5.0 - 8.0   Glucose, UA NEGATIVE NEGATIVE mg/dL   Hgb urine dipstick NEGATIVE NEGATIVE   Bilirubin Urine NEGATIVE NEGATIVE   Ketones, ur NEGATIVE NEGATIVE mg/dL   Protein, ur NEGATIVE NEGATIVE mg/dL   Nitrite NEGATIVE NEGATIVE   Leukocytes,Ua NEGATIVE NEGATIVE  Lipase, blood     Status: None   Collection Time: 03/09/22  3:15 PM  Result Value Ref Range   Lipase 30 11 - 51 U/L  Comprehensive metabolic panel     Status: Abnormal   Collection Time: 03/09/22  3:15 PM  Result Value Ref Range   Sodium 141 135 - 145 mmol/L   Potassium 3.7 3.5 - 5.1 mmol/L   Chloride 105 98 - 111 mmol/L   CO2 25 22 - 32 mmol/L   Glucose, Bld 105 (H) 70 - 99 mg/dL   BUN 14 8 - 23 mg/dL   Creatinine, Ser 0.80 0.44 - 1.00 mg/dL   Calcium 9.4 8.9 - 10.3 mg/dL   Total Protein 6.9 6.5 - 8.1 g/dL   Albumin 3.7 3.5 - 5.0 g/dL   AST 27 15 - 41 U/L   ALT 17 0 - 44 U/L   Alkaline Phosphatase 89 38 - 126 U/L   Total Bilirubin 0.5 0.3 - 1.2 mg/dL   GFR, Estimated >60 >60 mL/min   Anion gap 11 5 - 15  CBC     Status: None   Collection Time: 03/09/22  3:15 PM  Result Value Ref Range   WBC 6.3 4.0 - 10.5 K/uL   RBC 4.40 3.87 - 5.11 MIL/uL   Hemoglobin 13.2 12.0 - 15.0 g/dL   HCT 43.7 36.0 - 46.0 %   MCV 99.3 80.0 - 100.0 fL   MCH 30.0 26.0 - 34.0 pg   MCHC 30.2 30.0 - 36.0 g/dL   RDW 12.9 11.5 - 15.5 %   Platelets 383 150 - 400 K/uL   nRBC 0.0 0.0 - 0.2 %     Imaging Orders         US Abdomen Limited RUQ (LIVER/GB)         CT  ABDOMEN PELVIS W CONTRAST         MR ABDOMEN MRCP W WO CONTAST      Assessment and Plan   MARGERITE IMPASTATO is an 71 y.o. female with right upper quadrant pain.  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  I wonder if it may also be a phrygian cap.   This chronic finding does not explain her acute pain.  She has never had pain like this before.  I recommend checking a HIDA scan to evaluate for acute cholecystitis.  I do not think an MRI is necessary as her LFTs have been normal, however it would be interesting to see the gallbladder abnormality on MRI.  The surgery team will follow up on these results.   Felicie Morn, MD  [facility] Use [email or web address] to contact on call provider  New Patient Billing: [phone] - High MDM

## 2022-03-10 NOTE — Progress Notes (Signed)
Patient admitted to the hospital this morning by Dr. Bridgett Larsson  Patient seen and examined.  She does report having right upper quadrant pain without any associated nausea.  Her pain is better after receiving pain medications.  She has a left lower lobe colostomy which was placed for chronic incontinence due to issues with her rectum/anal sphincter.  She reports having normal output from her ostomy.  She has some mild tenderness currently in the right upper quadrant, but feels this is significantly improved since pain medication.  Initial CT abdomen shows gallbladder wall fundus thickening previously suggested to represent adenomyomatosis.  She was seen by general surgery and is currently ordered a HIDA scan.  LFTs are currently normal.  If HIDA scan unrevealing, could potentially consider MRCP for further evaluation.  Continue to treat supportively.  [employer]

## 2022-03-11 LAB — COMPREHENSIVE METABOLIC PANEL
ALT: 14 U/L (ref 0–44)
AST: 28 U/L (ref 15–41)
Albumin: 2.7 g/dL — ABNORMAL LOW (ref 3.5–5.0)
Alkaline Phosphatase: 62 U/L (ref 38–126)
Anion gap: 13 (ref 5–15)
BUN: 9 mg/dL (ref 8–23)
CO2: 20 mmol/L — ABNORMAL LOW (ref 22–32)
Calcium: 8.8 mg/dL — ABNORMAL LOW (ref 8.9–10.3)
Chloride: 107 mmol/L (ref 98–111)
Creatinine, Ser: 0.77 mg/dL (ref 0.44–1.00)
GFR, Estimated: >60 mL/min (ref >60)
Glucose, Bld: 71 mg/dL (ref 70–99)
Potassium: 3.8 mmol/L (ref 3.5–5.1)
Sodium: 140 mmol/L (ref 135–145)
Total Bilirubin: 0.8 mg/dL (ref 0.3–1.2)
Total Protein: 5.3 g/dL — ABNORMAL LOW (ref 6.5–8.1)

## 2022-03-11 LAB — CBC
HCT: 36.4 % (ref 36.0–46.0)
Hemoglobin: 11.4 g/dL — ABNORMAL LOW (ref 12.0–15.0)
MCH: 30.7 pg (ref 26.0–34.0)
MCHC: 31.3 g/dL (ref 30.0–36.0)
MCV: 98.1 fL (ref 80.0–100.0)
Platelets: 269 10*3/uL (ref 150–400)
RBC: 3.71 MIL/uL — ABNORMAL LOW (ref 3.87–5.11)
RDW: 12.9 % (ref 11.5–15.5)
WBC: 3.8 10*3/uL — ABNORMAL LOW (ref 4.0–10.5)
nRBC: 0.8 % — ABNORMAL HIGH (ref 0.0–0.2)

## 2022-03-11 LAB — HIV ANTIBODY (ROUTINE TESTING W REFLEX): HIV Screen 4th Generation wRfx: NONREACTIVE

## 2022-03-11 NOTE — Progress Notes (Signed)
Subjective: CC: Patient with continued ruq pain. Tolerated diet last night. No n/v. Denies fevers at home. Last ostomy output yesterday.   Objective: Vital signs in last 24 hours: Temp:  [97.8 F (36.6 C)-98.2 F (36.8 C)] 98.2 F (36.8 C) (11/15 0802) Pulse Rate:  [70-83] 76 (11/15 0802) Resp:  [13-26] 17 (11/15 0802) BP: (99-127)/(43-65) 120/47 (11/15 0802) SpO2:  [93 %-98 %] 93 % (11/15 0802)    Intake/Output from previous day: 11/14 0701 - 11/15 0700 In: 426.8 [I.V.:426.8] Out: 400 [Urine:400] Intake/Output this shift: No intake/output data recorded.  PE: Gen:  Alert, NAD, pleasant Abd: Soft, ND, RUQ ttp, +BS, prior midline wound/scar healed. L sided ostomy pink and viable. No air or stool in ostomy bag. Small bowel did not look dilated on CT yesterday and there was no signs of sbo.  Psych: A&Ox3  Lab Results:  Recent Labs    03/10/22 0459 03/11/22 0548  WBC 4.6 3.8*  HGB 12.1 11.4*  HCT 39.1 36.4  PLT 327 269   BMET Recent Labs    03/10/22 0459 03/11/22 0548  NA 141 140  K 3.6 3.8  CL 107 107  CO2 26 20*  GLUCOSE 116* 71  BUN 11 9  CREATININE 0.75 0.77  CALCIUM 9.4 8.8*   PT/INR No results for input(s): "LABPROT", "INR" in the last 72 hours. CMP     Component Value Date/Time   NA 140 03/11/2022 0548   NA 141 12/05/2020 1259   K 3.8 03/11/2022 0548   CL 107 03/11/2022 0548   CO2 20 (L) 03/11/2022 0548   GLUCOSE 71 03/11/2022 0548   BUN 9 03/11/2022 0548   BUN 20 12/05/2020 1259   CREATININE 0.77 03/11/2022 0548   CREATININE 0.80 02/28/2015 1023   CALCIUM 8.8 (L) 03/11/2022 0548   PROT 5.3 (L) 03/11/2022 0548   PROT 5.9 (L) 11/11/2020 1024   ALBUMIN 2.7 (L) 03/11/2022 0548   ALBUMIN 4.1 11/11/2020 1024   AST 28 03/11/2022 0548   ALT 14 03/11/2022 0548   ALKPHOS 62 03/11/2022 0548   BILITOT 0.8 03/11/2022 0548   BILITOT 0.6 11/11/2020 1024   GFRNONAA >60 03/11/2022 0548   GFRAA >60 01/12/2019 0306   Lipase     Component  Value Date/Time   LIPASE 30 03/09/2022 1515    Studies/Results: NM Hepatobiliary Liver Func  Result Date: 03/10/2022 CLINICAL DATA:  Right upper quadrant pain, nondiagnostic ultrasound EXAM: NUCLEAR MEDICINE HEPATOBILIARY IMAGING TECHNIQUE: Sequential images of the abdomen were obtained out to 60 minutes following intravenous administration of radiopharmaceutical. RADIOPHARMACEUTICALS:  5.0 mCi Tc-73m Choletec IV COMPARISON:  Ultrasound 03/09/2022, CT 03/10/2022 FINDINGS: Anterior planar imaging of the abdomen was performed after the intravenous administration of radiotracer. Imaging was performed for 2 hours. There is prompt uptake of radiotracer by the hepatic parenchyma. The common bile duct is seen by 15 minutes. Activity is identified within the small bowel by 30 minutes. After 2 hours of imaging, the gallbladder is not identified. IMPRESSION: 1. The gallbladder is not identified by 2 hours, consistent with acute cholecystitis. 2. No evidence of biliary obstruction. Electronically Signed   By: MRanda NgoM.D.   On: 03/10/2022 16:45   CT ABDOMEN PELVIS W CONTRAST  Result Date: 03/10/2022 CLINICAL DATA:  Bowel obstruction suspected RUQ pain, possible chole, h/o SBO EXAM: CT ABDOMEN AND PELVIS WITH CONTRAST TECHNIQUE: Multidetector CT imaging of the abdomen and pelvis was performed using the standard protocol following bolus administration of  intravenous contrast. RADIATION DOSE REDUCTION: This exam was performed according to the departmental dose-optimization program which includes automated exposure control, adjustment of the mA and/or kV according to patient size and/or use of iterative reconstruction technique. CONTRAST:  61m OMNIPAQUE IOHEXOL 350 MG/ML SOLN COMPARISON:  CT abdomen pelvis 01/07/2019, ultrasound abdomen 03/26/2022 FINDINGS: Lower chest: No acute abnormality. Hepatobiliary: No focal liver abnormality. Redemonstration of gallbladder fundus wall thickening. No gallstones or  pericholecystic fluid. No biliary dilatation. Pancreas: No focal lesion. Normal pancreatic contour. No surrounding inflammatory changes. No main pancreatic ductal dilatation. Spleen: Normal in size without focal abnormality. Adrenals/Urinary Tract: No adrenal nodule bilaterally. Bilateral kidneys enhance symmetrically. No hydronephrosis. No hydroureter. The urinary bladder is unremarkable. Stomach/Bowel: Left lower quadrant diverting colostomy formation. Stomach is within normal limits. No evidence of bowel wall thickening or dilatation. The appendix is not definitely identified with no inflammatory changes in the right lower quadrant to suggest acute appendicitis. Vascular/Lymphatic: No abdominal aorta or iliac aneurysm. Severe atherosclerotic plaque of the aorta and its branches. No abdominal, pelvic, or inguinal lymphadenopathy. Reproductive: Atrophic uterus versus surgically removed. No adnexal masses. Other: No intraperitoneal free fluid. No intraperitoneal free gas. No organized fluid collection. Musculoskeletal: No abdominal wall hernia or abnormality. No suspicious lytic or blastic osseous lesions. No acute displaced fracture. L2 through L4 posterolateral fusion surgical hardware. Multilevel severe degenerative changes of the spine. There is L1 on L2 and L2 on L3 mild retrolisthesis. Grade 1 anterolisthesis of L3 on L4. Bilateral total hip arthroplasties partially visualized. IMPRESSION: 1. 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 Finding can be further evaluated on MRCP recommend ultrasound abdomen 03/09/2022. 2. Otherwise no acute intra-abdominal or intrapelvic abnormality in a patient with a left lower quadrant diverting colostomy formation. 3.  Aortic Atherosclerosis (ICD10-I70.0). Electronically Signed   By: MIven FinnM.D.   On: 03/10/2022 01:09   UKoreaAbdomen Limited RUQ (LIVER/GB)  Result Date: 03/09/2022 CLINICAL DATA:  Right upper quadrant pain  EXAM: ULTRASOUND ABDOMEN LIMITED RIGHT UPPER QUADRANT COMPARISON:  None Available. FINDINGS: Gallbladder: Gallbladder distention. No wall thickening or gallstones. Positive sonographic Murphy sign noted by sonographer. Common bile duct: Diameter: 6.2 mm Liver: No focal lesion identified. Within normal limits in parenchymal echogenicity. Portal vein is patent on color Doppler imaging with normal direction of blood flow towards the liver. Other: None. IMPRESSION: 1. Distended gallbladder with positive sonographic Murphy sign, findings are concerning for acute cholecystitis, although there is no evidence of wall thickening or gallstones. Correlate clinically and consider HIDA scan for further evaluation. 2. Common bile duct is upper limits of normal in size. Correlate with liver function tests, if LFTs are abnormal, MRCP is recommended for further evaluation. Electronically Signed   By: LYetta GlassmanM.D.   On: 03/09/2022 17:26    Anti-infectives: Anti-infectives (From admission, onward)    Start     Dose/Rate Route Frequency Ordered Stop   03/10/22 0900  piperacillin-tazobactam (ZOSYN) IVPB 3.375 g        3.375 g 12.5 mL/hr over 240 Minutes Intravenous Every 8 hours 03/10/22 0240     03/10/22 0130  piperacillin-tazobactam (ZOSYN) IVPB 3.375 g        3.375 g 100 mL/hr over 30 Minutes Intravenous  Once 03/10/22 0125 03/10/22 0330        Assessment/Plan Acute Cholecystitis  HIDA confirmed acute cholecystitis.  Discussed with attending.  Given patient's most recent surgery being on 10/1 and how close out she is from this, would not recommend laparoscopic  cholecystectomy at this time. Recommend percutaneous cholecystostomy tube by IR.  This would allow her infection to be controlled and her to follow-up several weeks later as an outpatient to discuss elective cholecystectomy. Patient was initially opposed to this.  I discussed the indication for percutaneous cholecystostomy tube with her using  pictures/diagrams.  We discussed the risks of treating Cholecystitis with only antibiotics alone. She seems to understand these risks and was ultimately in agreement to talk to IR about percutaneous cholecystostomy tube placement. Updated TRH. Cont abx. Keep npo for now until IR see's. If no procedure planned today can go back on CLD.  FEN - NPO. See above. IVF per TRH VTE - SCDs, subq heparin ID - Zosyn   LOS: 1 day    Jillyn Ledger , [facility] 03/11/2022, 11:23 AM Please see Amion for pager number during day hours 7:00am-4:30pm

## 2022-03-11 NOTE — Progress Notes (Signed)
Request received for perc chole. Dr. Pascal Lux reviewed and states no chole tube needed d/t HIDA scan being a mis-read. Care team made aware of findings.    Narda Rutherford, AGNP-BC 03/11/2022, 2:04 PM

## 2022-03-11 NOTE — Progress Notes (Signed)
CCMD notified that patient went into 2nd degree AVB (Mbitz I Wenckebach).  Notified on call.  On call noted "incomplete right bundle branch block and mild carotid artery disease.

## 2022-03-11 NOTE — Progress Notes (Addendum)
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  She states that she started having right upper quadrant pain on Monday morning around 5 AM.  Increasing pain.  HIDA positive.  GS consulted   Assessment and Plan: Acute cholecystitis -HIDA negative (was initially read as positive but this was a misread) -NPO.  -On IV zosyn.  -GS consult-- ? Drain  Chronic back pain Chronic. Continue with po baclofen.  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 Was placed due to fecal incontinence and rectal prolapse.  History of PSVT (paroxysmal supraventricular tachycardia) Chronic. On atenolol and verapamil.  Hyperlipidemia Stable. Continue lipitor 40 mg     DVT prophylaxis: heparin injection 5,000 Units Start: 03/10/22 0600 SCDs Start: 03/10/22 0344    Code Status: Full Code Family Communication: at bedside  Disposition Plan:  Level of care: Telemetry Medical Status is: Inpatient Remains inpatient appropriate because: needs GB drain    Consultants:  GS IR   Subjective: No SOB, no CP Pain better with abx  Objective: Vitals:   03/10/22 1814 03/10/22 2013 03/11/22 0510 03/11/22 0802  BP: (!) 118/47 110/65 (!) 99/58 (!) 120/47  Pulse: 70 82 71 76  Resp: '18 16 16 17  '$ Temp: 98.1 F (36.7 C) 97.8 F (36.6 C)  98.2 F (36.8 C)  TempSrc: Oral Oral  Oral  SpO2: 94% 97% 93% 93%    Intake/Output Summary (Last 24 hours) at 03/11/2022 1045 Last data filed at 03/10/2022 2043 Gross per 24 hour  Intake 426.83 ml  Output 400 ml  Net 26.83 ml   There were no vitals filed for this  visit.  Examination:   General: Appearance:    Thin female in no acute distress     Lungs:     respirations unlabored  Heart:    Normal heart rate.   MS:   All extremities are intact.    Neurologic:   Awake, alert       Data Reviewed: I have personally reviewed following labs and imaging studies  CBC: Recent Labs  Lab 03/09/22 1515 03/10/22 0459 03/11/22 0548  WBC 6.3 4.6 3.8*  NEUTROABS  --  2.0  --   HGB 13.2 12.1 11.4*  HCT 43.7 39.1 36.4  MCV 99.3 99.0 98.1  PLT 383 327 301   Basic Metabolic Panel: Recent Labs  Lab 03/09/22 1515 03/10/22 0459 03/11/22 0548  NA 141 141 140  K 3.7 3.6 3.8  CL 105 107 107  CO2 25 26 20*  GLUCOSE 105* 116* 71  BUN '14 11 9  '$ CREATININE 0.80 0.75 0.77  CALCIUM 9.4 9.4 8.8*  MG  --  2.0  --    GFR: Estimated Creatinine Clearance: 54.4 mL/min (by C-G formula based on SCr of 0.77 mg/dL). Liver Function Tests: Recent Labs  Lab 03/09/22 1515 03/10/22 0459 03/11/22 0548  AST '27 22 28  '$ ALT '17 15 14  '$ ALKPHOS 89 69 62  BILITOT 0.5 0.5 0.8  PROT 6.9 6.0* 5.3*  ALBUMIN 3.7 3.1* 2.7*   Recent Labs  Lab 03/09/22 1515  LIPASE 30   No results for input(s): "AMMONIA" in the  last 168 hours. Coagulation Profile: No results for input(s): "INR", "PROTIME" in the last 168 hours. Cardiac Enzymes: No results for input(s): "CKTOTAL", "CKMB", "CKMBINDEX", "TROPONINI" in the last 168 hours. BNP (last 3 results) No results for input(s): "PROBNP" in the last 8760 hours. HbA1C: No results for input(s): "HGBA1C" in the last 72 hours. CBG: No results for input(s): "GLUCAP" in the last 168 hours. Lipid Profile: No results for input(s): "CHOL", "HDL", "LDLCALC", "TRIG", "CHOLHDL", "LDLDIRECT" in the last 72 hours. Thyroid Function Tests: No results for input(s): "TSH", "T4TOTAL", "FREET4", "T3FREE", "THYROIDAB" in the last 72 hours. Anemia Panel: No results for input(s): "VITAMINB12", "FOLATE", "FERRITIN", "TIBC", "IRON", "RETICCTPCT"  in the last 72 hours. Sepsis Labs: No results for input(s): "PROCALCITON", "LATICACIDVEN" in the last 168 hours.  No results found for this or any previous visit (from the past 240 hour(s)).       Radiology Studies: NM Hepatobiliary Liver Func  Result Date: 03/10/2022 CLINICAL DATA:  Right upper quadrant pain, nondiagnostic ultrasound EXAM: NUCLEAR MEDICINE HEPATOBILIARY IMAGING TECHNIQUE: Sequential images of the abdomen were obtained out to 60 minutes following intravenous administration of radiopharmaceutical. RADIOPHARMACEUTICALS:  5.0 mCi Tc-3m Choletec IV COMPARISON:  Ultrasound 03/09/2022, CT 03/10/2022 FINDINGS: Anterior planar imaging of the abdomen was performed after the intravenous administration of radiotracer. Imaging was performed for 2 hours. There is prompt uptake of radiotracer by the hepatic parenchyma. The common bile duct is seen by 15 minutes. Activity is identified within the small bowel by 30 minutes. After 2 hours of imaging, the gallbladder is not identified. IMPRESSION: 1. The gallbladder is not identified by 2 hours, consistent with acute cholecystitis. 2. No evidence of biliary obstruction. Electronically Signed   By: MRanda NgoM.D.   On: 03/10/2022 16:45   CT ABDOMEN PELVIS W CONTRAST  Result Date: 03/10/2022 CLINICAL DATA:  Bowel obstruction suspected RUQ pain, possible chole, h/o SBO EXAM: CT ABDOMEN AND PELVIS WITH CONTRAST TECHNIQUE: Multidetector CT imaging of the abdomen and pelvis was performed using the standard protocol following bolus administration of intravenous contrast. RADIATION DOSE REDUCTION: This exam was performed according to the departmental dose-optimization program which includes automated exposure control, adjustment of the mA and/or kV according to patient size and/or use of iterative reconstruction technique. CONTRAST:  739mOMNIPAQUE IOHEXOL 350 MG/ML SOLN COMPARISON:  CT abdomen pelvis 01/07/2019, ultrasound abdomen 03/26/2022  FINDINGS: Lower chest: No acute abnormality. Hepatobiliary: No focal liver abnormality. Redemonstration of gallbladder fundus wall thickening. No gallstones or pericholecystic fluid. No biliary dilatation. Pancreas: No focal lesion. Normal pancreatic contour. No surrounding inflammatory changes. No main pancreatic ductal dilatation. Spleen: Normal in size without focal abnormality. Adrenals/Urinary Tract: No adrenal nodule bilaterally. Bilateral kidneys enhance symmetrically. No hydronephrosis. No hydroureter. The urinary bladder is unremarkable. Stomach/Bowel: Left lower quadrant diverting colostomy formation. Stomach is within normal limits. No evidence of bowel wall thickening or dilatation. The appendix is not definitely identified with no inflammatory changes in the right lower quadrant to suggest acute appendicitis. Vascular/Lymphatic: No abdominal aorta or iliac aneurysm. Severe atherosclerotic plaque of the aorta and its branches. No abdominal, pelvic, or inguinal lymphadenopathy. Reproductive: Atrophic uterus versus surgically removed. No adnexal masses. Other: No intraperitoneal free fluid. No intraperitoneal free gas. No organized fluid collection. Musculoskeletal: No abdominal wall hernia or abnormality. No suspicious lytic or blastic osseous lesions. No acute displaced fracture. L2 through L4 posterolateral fusion surgical hardware. Multilevel severe degenerative changes of the spine. There is L1 on L2 and L2 on L3 mild retrolisthesis. Grade 1  anterolisthesis of L3 on L4. Bilateral total hip arthroplasties partially visualized. IMPRESSION: 1. 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 Finding can be further evaluated on MRCP recommend ultrasound abdomen 03/09/2022. 2. Otherwise no acute intra-abdominal or intrapelvic abnormality in a patient with a left lower quadrant diverting colostomy formation. 3.  Aortic Atherosclerosis (ICD10-I70.0). Electronically Signed    By: Iven Finn M.D.   On: 03/10/2022 01:09   US Abdomen Limited RUQ (LIVER/GB)  Result Date: 03/09/2022 CLINICAL DATA:  Right upper quadrant pain EXAM: ULTRASOUND ABDOMEN LIMITED RIGHT UPPER QUADRANT COMPARISON:  None Available. FINDINGS: Gallbladder: Gallbladder distention. No wall thickening or gallstones. Positive sonographic Murphy sign noted by sonographer. Common bile duct: Diameter: 6.2 mm Liver: No focal lesion identified. Within normal limits in parenchymal echogenicity. Portal vein is patent on color Doppler imaging with normal direction of blood flow towards the liver. Other: None. IMPRESSION: 1. Distended gallbladder with positive sonographic Murphy sign, findings are concerning for acute cholecystitis, although there is no evidence of wall thickening or gallstones. Correlate clinically and consider HIDA scan for further evaluation. 2. Common bile duct is upper limits of normal in size. Correlate with liver function tests, if LFTs are abnormal, MRCP is recommended for further evaluation. Electronically Signed   By: Yetta Glassman M.D.   On: 03/09/2022 17:26        Scheduled Meds:  atenolol  12.5 mg Oral BID   atorvastatin  40 mg Oral Daily   baclofen  20 mg Oral QHS   clonazePAM  1 mg Oral QHS   heparin  5,000 Units Subcutaneous Q8H   pantoprazole  40 mg Oral Daily   verapamil  240 mg Oral QHS   Continuous Infusions:  lactated ringers Stopped (03/10/22 1640)   piperacillin-tazobactam (ZOSYN)  IV 3.375 g (03/11/22 1009)     LOS: 1 day    Time spent: 45 minutes spent on chart review, discussion with nursing staff, consultants, updating family and interview/physical exam; more than 50% of that time was spent in counseling and/or coordination of care.    Geradine Girt, DO [facility] Available via Epic secure chat 7am-7pm After these hours, please refer to coverage provider listed on [email or web address] 03/11/2022, 10:45 AM

## 2022-03-12 ENCOUNTER — Encounter: Payer: Self-pay | Admitting: Cardiovascular Disease

## 2022-03-12 MED ORDER — AMOXICILLIN-POT CLAVULANATE 875-125 MG PO TABS
1.0000 | ORAL_TABLET | Freq: Two times a day (BID) | ORAL | Status: DC
  Administered 2022-03-12 – 2022-03-13 (×3): 1 via ORAL
  Filled 2022-03-12 (×3): qty 1

## 2022-03-12 MED ORDER — LOPERAMIDE HCL 2 MG PO CAPS
2.0000 mg | ORAL_CAPSULE | ORAL | Status: DC | PRN
  Administered 2022-03-12: 2 mg via ORAL
  Filled 2022-03-12: qty 1

## 2022-03-12 MED ORDER — DOCUSATE SODIUM 100 MG PO CAPS
100.0000 mg | ORAL_CAPSULE | Freq: Two times a day (BID) | ORAL | Status: DC
  Administered 2022-03-12 (×2): 100 mg via ORAL
  Filled 2022-03-12 (×3): qty 1

## 2022-03-12 MED ORDER — RISAQUAD PO CAPS
1.0000 | ORAL_CAPSULE | Freq: Every day | ORAL | Status: DC
  Administered 2022-03-12 – 2022-03-13 (×2): 1 via ORAL
  Filled 2022-03-12 (×2): qty 1

## 2022-03-12 NOTE — Progress Notes (Signed)
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  She states that she started having right upper quadrant pain on Monday morning around 5 AM.  Increasing pain.  HIDA positive.  GS consulted   Assessment and Plan: Abdominal pain -HIDA negative (was initially read as positive but this was a misread) -advance diet -change to PO abx as patient thinks she is better with the abx  -GS signed off -bowel regimen -outpatient GI referral  Chronic back pain Chronic. Continue with po baclofen.  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 Was placed due to fecal incontinence and rectal prolapse.  History of PSVT (paroxysmal supraventricular tachycardia) Chronic. On atenolol and verapamil.  Hyperlipidemia Stable. Continue lipitor 40 mg     DVT prophylaxis: heparin injection 5,000 Units Start: 03/10/22 0600 SCDs Start: 03/10/22 0344    Code Status: Full Code Family Communication: at bedside  Disposition Plan:  Level of care: Telemetry Medical Status is: Inpatient Remains inpatient appropriate because: home later today or in AM    Consultants:  GS IR   Subjective:  Pain better with abx  Objective: Vitals:   03/11/22 1501 03/11/22 1505 03/11/22 2109 03/12/22 0815  BP: (!) 96/44 (!) 107/59 (!) 104/49 (!) 107/49  Pulse: 83 79 85 71  Resp: 18   18  Temp:  98.2 F (36.8 C) 97.6 F (36.4 C)   TempSrc:  Oral Oral Oral  SpO2: 94%  95% 95%   No intake or output data in the 24 hours ending 03/12/22 1048  There were no vitals filed for this visit.  Examination:   General: Appearance:    Thin  female in no acute distress     Lungs:     respirations unlabored  Heart:    Normal heart rate.   MS:   All extremities are intact.    Neurologic:   Awake, alert       Data Reviewed: I have personally reviewed following labs and imaging studies  CBC: Recent Labs  Lab 03/09/22 1515 03/10/22 0459 03/11/22 0548  WBC 6.3 4.6 3.8*  NEUTROABS  --  2.0  --   HGB 13.2 12.1 11.4*  HCT 43.7 39.1 36.4  MCV 99.3 99.0 98.1  PLT 383 327 696   Basic Metabolic Panel: Recent Labs  Lab 03/09/22 1515 03/10/22 0459 03/11/22 0548  NA 141 141 140  K 3.7 3.6 3.8  CL 105 107 107  CO2 25 26 20*  GLUCOSE 105* 116* 71  BUN '14 11 9  '$ CREATININE 0.80 0.75 0.77  CALCIUM 9.4 9.4 8.8*  MG  --  2.0  --    GFR: Estimated Creatinine Clearance: 53.6 mL/min (by C-G formula based on SCr of 0.77 mg/dL). Liver Function Tests: Recent Labs  Lab 03/09/22 1515 03/10/22 0459 03/11/22 0548  AST '27 22 28  '$ ALT '17 15 14  '$ ALKPHOS 89 69 62  BILITOT 0.5 0.5 0.8  PROT 6.9 6.0* 5.3*  ALBUMIN 3.7 3.1* 2.7*   Recent Labs  Lab 03/09/22 1515  LIPASE 30   No results for input(s): "AMMONIA" in the last 168 hours. Coagulation Profile: No results for  input(s): "INR", "PROTIME" in the last 168 hours. Cardiac Enzymes: No results for input(s): "CKTOTAL", "CKMB", "CKMBINDEX", "TROPONINI" in the last 168 hours. BNP (last 3 results) No results for input(s): "PROBNP" in the last 8760 hours. HbA1C: No results for input(s): "HGBA1C" in the last 72 hours. CBG: No results for input(s): "GLUCAP" in the last 168 hours. Lipid Profile: No results for input(s): "CHOL", "HDL", "LDLCALC", "TRIG", "CHOLHDL", "LDLDIRECT" in the last 72 hours. Thyroid Function Tests: No results for input(s): "TSH", "T4TOTAL", "FREET4", "T3FREE", "THYROIDAB" in the last 72 hours. Anemia Panel: No results for input(s): "VITAMINB12", "FOLATE", "FERRITIN", "TIBC", "IRON", "RETICCTPCT" in the last 72 hours. Sepsis Labs: No results for  input(s): "PROCALCITON", "LATICACIDVEN" in the last 168 hours.  No results found for this or any previous visit (from the past 240 hour(s)).       Radiology Studies: NM Hepatobiliary Liver Func  Addendum Date: 03/11/2022   ADDENDUM REPORT: 03/11/2022 12:34 ADDENDUM: Nuclear medicine HIDA scan was reviewed as request has been made for cholecystostomy tube placement given concern for nonvisualization of the gallbladder. Personal review of the HIDA scan demonstrates appropriate filling of the gallbladder, initially seen on the 30 minute anterior projection planar image with subsequent filling, best seen on the 40 minute anterior projection planar image. Note, the gallbladder does overlie the descending portion of the duodenum as was demonstrated on abdominal CT performed 03/10/2022, likely explaining the initial interpretation suggesting nonvisualization. Further evaluation could be performed with either repeat nuclear medicine HIDA scan, this time with lateral projection imaging, versus MRCP as indicated. Above was discussed with referring surgeon, Dr. Bobbye Morton. Electronically Signed   By: Sandi Mariscal M.D.   On: 03/11/2022 12:34   Result Date: 03/11/2022 CLINICAL DATA:  Right upper quadrant pain, nondiagnostic ultrasound EXAM: NUCLEAR MEDICINE HEPATOBILIARY IMAGING TECHNIQUE: Sequential images of the abdomen were obtained out to 60 minutes following intravenous administration of radiopharmaceutical. RADIOPHARMACEUTICALS:  5.0 mCi Tc-81m Choletec IV COMPARISON:  Ultrasound 03/09/2022, CT 03/10/2022 FINDINGS: Anterior planar imaging of the abdomen was performed after the intravenous administration of radiotracer. Imaging was performed for 2 hours. There is prompt uptake of radiotracer by the hepatic parenchyma. The common bile duct is seen by 15 minutes. Activity is identified within the small bowel by 30 minutes. After 2 hours of imaging, the gallbladder is not identified. IMPRESSION: 1. The gallbladder  is not identified by 2 hours, consistent with acute cholecystitis. 2. No evidence of biliary obstruction. Electronically Signed: By: MRanda NgoM.D. On: 03/10/2022 16:45        Scheduled Meds:  amoxicillin-clavulanate  1 tablet Oral Q12H   atenolol  12.5 mg Oral BID   atorvastatin  40 mg Oral Daily   baclofen  20 mg Oral QHS   clonazePAM  1 mg Oral QHS   docusate sodium  100 mg Oral BID   heparin  5,000 Units Subcutaneous Q8H   pantoprazole  40 mg Oral Daily   verapamil  240 mg Oral QHS   Continuous Infusions:     LOS: 2 days    Time spent: 45 minutes spent on chart review, discussion with nursing staff, consultants, updating family and interview/physical exam; more than 50% of that time was spent in counseling and/or coordination of care.    JGeradine Girt DO [facility] Available via Epic secure chat 7am-7pm After these hours, please refer to coverage provider listed on [email or web address] 03/12/2022, 10:48 AM

## 2022-03-12 NOTE — Progress Notes (Signed)
Subjective: No abdominal pain today.  Tolerating diet from yesterday with no nausea or vomiting.  States constipation has only been present just since being in the hospital.  Takes metamucil BID at home and stools are reportedly soft.   Objective: Vital signs in last 24 hours: Temp:  [97.6 F (36.4 C)-98.2 F (36.8 C)] 97.6 F (36.4 C) (11/15 2109) Pulse Rate:  [71-85] 71 (11/16 0815) Resp:  [18] 18 (11/16 0815) BP: (96-107)/(44-59) 107/49 (11/16 0815) SpO2:  [94 %-95 %] 95 % (11/16 0815)    Intake/Output from previous day: No intake/output data recorded. Intake/Output this shift: No intake/output data recorded.  PE: Gen:  Alert, NAD, pleasant Abd: Soft, NT, ND, colostomy with hard stool ball present at os.  Stoma pink and viable Psych: A&Ox3  Lab Results:  Recent Labs    03/10/22 0459 03/11/22 0548  WBC 4.6 3.8*  HGB 12.1 11.4*  HCT 39.1 36.4  PLT 327 269   BMET Recent Labs    03/10/22 0459 03/11/22 0548  NA 141 140  K 3.6 3.8  CL 107 107  CO2 26 20*  GLUCOSE 116* 71  BUN 11 9  CREATININE 0.75 0.77  CALCIUM 9.4 8.8*   PT/INR No results for input(s): "LABPROT", "INR" in the last 72 hours. CMP     Component Value Date/Time   NA 140 03/11/2022 0548   NA 141 12/05/2020 1259   K 3.8 03/11/2022 0548   CL 107 03/11/2022 0548   CO2 20 (L) 03/11/2022 0548   GLUCOSE 71 03/11/2022 0548   BUN 9 03/11/2022 0548   BUN 20 12/05/2020 1259   CREATININE 0.77 03/11/2022 0548   CREATININE 0.80 02/28/2015 1023   CALCIUM 8.8 (L) 03/11/2022 0548   PROT 5.3 (L) 03/11/2022 0548   PROT 5.9 (L) 11/11/2020 1024   ALBUMIN 2.7 (L) 03/11/2022 0548   ALBUMIN 4.1 11/11/2020 1024   AST 28 03/11/2022 0548   ALT 14 03/11/2022 0548   ALKPHOS 62 03/11/2022 0548   BILITOT 0.8 03/11/2022 0548   BILITOT 0.6 11/11/2020 1024   GFRNONAA >60 03/11/2022 0548   GFRAA >60 01/12/2019 0306   Lipase     Component Value Date/Time   LIPASE 30 03/09/2022 1515     Studies/Results: NM Hepatobiliary Liver Func  Addendum Date: 03/11/2022   ADDENDUM REPORT: 03/11/2022 12:34 ADDENDUM: Nuclear medicine HIDA scan was reviewed as request has been made for cholecystostomy tube placement given concern for nonvisualization of the gallbladder. Personal review of the HIDA scan demonstrates appropriate filling of the gallbladder, initially seen on the 30 minute anterior projection planar image with subsequent filling, best seen on the 40 minute anterior projection planar image. Note, the gallbladder does overlie the descending portion of the duodenum as was demonstrated on abdominal CT performed 03/10/2022, likely explaining the initial interpretation suggesting nonvisualization. Further evaluation could be performed with either repeat nuclear medicine HIDA scan, this time with lateral projection imaging, versus MRCP as indicated. Above was discussed with referring surgeon, Dr. Bobbye Morton. Electronically Signed   By: Sandi Mariscal M.D.   On: 03/11/2022 12:34   Result Date: 03/11/2022 CLINICAL DATA:  Right upper quadrant pain, nondiagnostic ultrasound EXAM: NUCLEAR MEDICINE HEPATOBILIARY IMAGING TECHNIQUE: Sequential images of the abdomen were obtained out to 60 minutes following intravenous administration of radiopharmaceutical. RADIOPHARMACEUTICALS:  5.0 mCi Tc-71m Choletec IV COMPARISON:  Ultrasound 03/09/2022, CT 03/10/2022 FINDINGS: Anterior planar imaging of the abdomen was performed after the intravenous administration of radiotracer. Imaging  was performed for 2 hours. There is prompt uptake of radiotracer by the hepatic parenchyma. The common bile duct is seen by 15 minutes. Activity is identified within the small bowel by 30 minutes. After 2 hours of imaging, the gallbladder is not identified. IMPRESSION: 1. The gallbladder is not identified by 2 hours, consistent with acute cholecystitis. 2. No evidence of biliary obstruction. Electronically Signed: By: Randa Ngo M.D.  On: 03/10/2022 16:45    Anti-infectives: Anti-infectives (From admission, onward)    Start     Dose/Rate Route Frequency Ordered Stop   03/12/22 1015  amoxicillin-clavulanate (AUGMENTIN) 875-125 MG per tablet 1 tablet        1 tablet Oral Every 12 hours 03/12/22 0917     03/10/22 0900  piperacillin-tazobactam (ZOSYN) IVPB 3.375 g  Status:  Discontinued        3.375 g 12.5 mL/hr over 240 Minutes Intravenous Every 8 hours 03/10/22 0240 03/12/22 0746   03/10/22 0130  piperacillin-tazobactam (ZOSYN) IVPB 3.375 g        3.375 g 100 mL/hr over 30 Minutes Intravenous  Once 03/10/22 0125 03/10/22 0330        Assessment/Plan Abdominal pain, unclear etiology, constipation -HIDA was misread and is actually negative for acute cholecystitis -abdominal pain improving and tolerating diet advancement -agree with outpatient GI referral -patient appears constipated on her presenting CT scan.  We discussed a bowel regimen but she assures me that this is only a hospital problem.  She is not interested in adding anything else currently. -plan for likely dc later today -we will sign off at this time.  D/w primary at bedside  FEN - FLD, soft later today VTE - SCDs, subq heparin ID - Augmentin  Radiology and TRH notes reviewed along with last 24 hours of vitals, I/Os.   LOS: 2 days    Henreitta Cea , [facility] Surgery 03/12/2022, 9:30 AM Please see Amion for pager number during day hours 7:00am-4:30pm

## 2022-03-12 NOTE — Plan of Care (Signed)
0700:Pt resting in bed at beginning of shift pt alert and oriented x4, pt on room air, pt call bell within reach and bed in lowest position.  Pt IV abx changed to oral per MD  Pt tolerated full liquid diet and was advanced to soft diet

## 2022-03-12 NOTE — Care Management Important Message (Signed)
Important Message  Patient Details  Name: [name] MRN: [id number] Date of Birth: 1951/04/25   Medicare Important Message Given:  Yes     Hannah Beat 03/12/2022, 10:48 AM

## 2022-03-13 MED ORDER — OXYCODONE HCL 5 MG PO TABS: 5.0000 mg | ORAL_TABLET | ORAL | 0 refills | Status: DC | PRN

## 2022-03-13 MED ORDER — AMOXICILLIN-POT CLAVULANATE 875-125 MG PO TABS: 1.0000 | ORAL_TABLET | Freq: Two times a day (BID) | ORAL | 0 refills | Status: DC

## 2022-03-13 NOTE — Discharge Summary (Signed)
Physician Discharge Summary  [name] GNF:[id number] DOB: [birth year] DOA: 03/09/2022  PCP: Raina Mina., MD  Admit date: 03/09/2022 Discharge date: 03/13/2022  Admitted From: home Discharge disposition: home   Recommendations for Outpatient Follow-Up:   Referral to GI Needs bowel regimen   Discharge Diagnosis:   Principal Problem:   RUQ pain Active Problems:   Hyperlipidemia   History of PSVT (paroxysmal supraventricular tachycardia)   S/P colostomy ([place])   Chronic back pain    Discharge Condition: Improved.  Diet recommendation: soft diet  Wound care: None.  Code status: Full.   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  She states that she started having right upper quadrant pain on Monday morning around 5 AM.  Increasing pain.  HIDA initially misread as positive.  GS consulted - no need for surgery   Hospital Course by Problem:   Abdominal pain -HIDA negative (was initially read as positive but this was a misread) -advance diet -change to PO abx as patient thinks she is better with the abx x 5 days -GS signed off -bowel regimen to avoid constipation -outpatient GI referral   Chronic back pain Chronic. Continue with po baclofen.   S/P colostomy ([place]) Stable. Was placed due to fecal incontinence and rectal prolapse.   History of PSVT (paroxysmal supraventricular tachycardia) Chronic. On atenolol and verapamil.   Hyperlipidemia Stable. Continue lipitor 40 mg      Medical Consultants:   GS    Discharge Exam:   Vitals:   03/13/22 0747 03/13/22 0822  BP: (!) 112/46 106/60  Pulse: 67 79  Resp: 18   Temp: 97.9 F (36.6  C)   SpO2: 96%    Vitals:   03/12/22 1941 03/13/22 0513 03/13/22 0747 03/13/22 0822  BP: (!) 117/53 (!) 105/40 (!) 112/46 106/60  Pulse: 85 64 67 79  Resp: '18 16 18   '$ Temp: 98 F (36.7 C)  97.9 F (36.6 C)   TempSrc: Oral  Oral   SpO2: 97% 92% 96%     General exam: Appears calm and comfortable.   The results of significant diagnostics from this hospitalization (including imaging, microbiology, ancillary and laboratory) are listed below for reference.     Procedures and Diagnostic Studies:   NM Hepatobiliary Liver Func  Addendum Date: 03/11/2022   ADDENDUM REPORT: 03/11/2022 12:34 ADDENDUM: Nuclear medicine HIDA scan was reviewed as request has been made for cholecystostomy tube placement given concern for nonvisualization of the gallbladder. Personal review of the HIDA scan demonstrates appropriate filling of the gallbladder, initially seen on the 30 minute anterior projection planar image with subsequent filling, best seen on the 40 minute anterior projection planar image. Note, the gallbladder does overlie the descending portion of the duodenum as was demonstrated on abdominal CT performed 03/10/2022, likely explaining the initial interpretation suggesting nonvisualization. Further evaluation could be performed with either repeat nuclear medicine HIDA scan, this time with lateral projection imaging, versus MRCP as indicated. Above was discussed with referring surgeon, Dr. Bobbye Morton. Electronically Signed   By: Sandi Mariscal M.D.   On: 03/11/2022 12:34   Result Date: 03/11/2022 CLINICAL DATA:  Right upper quadrant pain, nondiagnostic ultrasound EXAM: NUCLEAR MEDICINE HEPATOBILIARY  IMAGING TECHNIQUE: Sequential images of the abdomen were obtained out to 60 minutes following intravenous administration of radiopharmaceutical. RADIOPHARMACEUTICALS:  5.0 mCi Tc-65m Choletec IV COMPARISON:  Ultrasound 03/09/2022, CT 03/10/2022 FINDINGS: Anterior planar imaging of the abdomen was performed after  the intravenous administration of radiotracer. Imaging was performed for 2 hours. There is prompt uptake of radiotracer by the hepatic parenchyma. The common bile duct is seen by 15 minutes. Activity is identified within the small bowel by 30 minutes. After 2 hours of imaging, the gallbladder is not identified. IMPRESSION: 1. The gallbladder is not identified by 2 hours, consistent with acute cholecystitis. 2. No evidence of biliary obstruction. Electronically Signed: By: MRanda NgoM.D. On: 03/10/2022 16:45   CT ABDOMEN PELVIS W CONTRAST  Result Date: 03/10/2022 CLINICAL DATA:  Bowel obstruction suspected RUQ pain, possible chole, h/o SBO EXAM: CT ABDOMEN AND PELVIS WITH CONTRAST TECHNIQUE: Multidetector CT imaging of the abdomen and pelvis was performed using the standard protocol following bolus administration of intravenous contrast. RADIATION DOSE REDUCTION: This exam was performed according to the departmental dose-optimization program which includes automated exposure control, adjustment of the mA and/or kV according to patient size and/or use of iterative reconstruction technique. CONTRAST:  787mOMNIPAQUE IOHEXOL 350 MG/ML SOLN COMPARISON:  CT abdomen pelvis 01/07/2019, ultrasound abdomen 03/26/2022 FINDINGS: Lower chest: No acute abnormality. Hepatobiliary: No focal liver abnormality. Redemonstration of gallbladder fundus wall thickening. No gallstones or pericholecystic fluid. No biliary dilatation. Pancreas: No focal lesion. Normal pancreatic contour. No surrounding inflammatory changes. No main pancreatic ductal dilatation. Spleen: Normal in size without focal abnormality. Adrenals/Urinary Tract: No adrenal nodule bilaterally. Bilateral kidneys enhance symmetrically. No hydronephrosis. No hydroureter. The urinary bladder is unremarkable. Stomach/Bowel: Left lower quadrant diverting colostomy formation. Stomach is within normal limits. No evidence of bowel wall thickening or dilatation. The  appendix is not definitely identified with no inflammatory changes in the right lower quadrant to suggest acute appendicitis. Vascular/Lymphatic: No abdominal aorta or iliac aneurysm. Severe atherosclerotic plaque of the aorta and its branches. No abdominal, pelvic, or inguinal lymphadenopathy. Reproductive: Atrophic uterus versus surgically removed. No adnexal masses. Other: No intraperitoneal free fluid. No intraperitoneal free gas. No organized fluid collection. Musculoskeletal: No abdominal wall hernia or abnormality. No suspicious lytic or blastic osseous lesions. No acute displaced fracture. L2 through L4 posterolateral fusion surgical hardware. Multilevel severe degenerative changes of the spine. There is L1 on L2 and L2 on L3 mild retrolisthesis. Grade 1 anterolisthesis of L3 on L4. Bilateral total hip arthroplasties partially visualized. IMPRESSION: 1. 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 Finding can be further evaluated on MRCP recommend ultrasound abdomen 03/09/2022. 2. Otherwise no acute intra-abdominal or intrapelvic abnormality in a patient with a left lower quadrant diverting colostomy formation. 3.  Aortic Atherosclerosis (ICD10-I70.0). Electronically Signed   By: MoIven Finn.D.   On: 03/10/2022 01:09   USKoreabdomen Limited RUQ (LIVER/GB)  Result Date: 03/09/2022 CLINICAL DATA:  Right upper quadrant pain EXAM: ULTRASOUND ABDOMEN LIMITED RIGHT UPPER QUADRANT COMPARISON:  None Available. FINDINGS: Gallbladder: Gallbladder distention. No wall thickening or gallstones. Positive sonographic Murphy sign noted by sonographer. Common bile duct: Diameter: 6.2 mm Liver: No focal lesion identified. Within normal limits in parenchymal echogenicity. Portal vein is patent on color Doppler imaging with normal direction of blood flow towards the liver. Other: None. IMPRESSION: 1. Distended gallbladder with positive sonographic Murphy sign, findings are  concerning for acute cholecystitis, although there is no evidence of wall thickening or gallstones. Correlate  clinically and consider HIDA scan for further evaluation. 2. Common bile duct is upper limits of normal in size. Correlate with liver function tests, if LFTs are abnormal, MRCP is recommended for further evaluation. Electronically Signed   By: Yetta Glassman M.D.   On: 03/09/2022 17:26     Labs:   Basic Metabolic Panel: Recent Labs  Lab 03/09/22 1515 03/10/22 0459 03/11/22 0548  NA 141 141 140  K 3.7 3.6 3.8  CL 105 107 107  CO2 25 26 20*  GLUCOSE 105* 116* 71  BUN '14 11 9  '$ CREATININE 0.80 0.75 0.77  CALCIUM 9.4 9.4 8.8*  MG  --  2.0  --    GFR Estimated Creatinine Clearance: 53.6 mL/min (by C-G formula based on SCr of 0.77 mg/dL). Liver Function Tests: Recent Labs  Lab 03/09/22 1515 03/10/22 0459 03/11/22 0548  AST '27 22 28  '$ ALT '17 15 14  '$ ALKPHOS 89 69 62  BILITOT 0.5 0.5 0.8  PROT 6.9 6.0* 5.3*  ALBUMIN 3.7 3.1* 2.7*   Recent Labs  Lab 03/09/22 1515  LIPASE 30   No results for input(s): "AMMONIA" in the last 168 hours. Coagulation profile No results for input(s): "INR", "PROTIME" in the last 168 hours.  CBC: Recent Labs  Lab 03/09/22 1515 03/10/22 0459 03/11/22 0548  WBC 6.3 4.6 3.8*  NEUTROABS  --  2.0  --   HGB 13.2 12.1 11.4*  HCT 43.7 39.1 36.4  MCV 99.3 99.0 98.1  PLT 383 327 269   Cardiac Enzymes: No results for input(s): "CKTOTAL", "CKMB", "CKMBINDEX", "TROPONINI" in the last 168 hours. BNP: Invalid input(s): "POCBNP" CBG: No results for input(s): "GLUCAP" in the last 168 hours. D-Dimer No results for input(s): "DDIMER" in the last 72 hours. Hgb A1c No results for input(s): "HGBA1C" in the last 72 hours. Lipid Profile No results for input(s): "CHOL", "HDL", "LDLCALC", "TRIG", "CHOLHDL", "LDLDIRECT" in the last 72 hours. Thyroid function studies No results for input(s): "TSH", "T4TOTAL", "T3FREE", "THYROIDAB" in the last 72  hours.  Invalid input(s): "FREET3" Anemia work up No results for input(s): "VITAMINB12", "FOLATE", "FERRITIN", "TIBC", "IRON", "RETICCTPCT" in the last 72 hours. Microbiology No results found for this or any previous visit (from the past 240 hour(s)).   Discharge Instructions:   Discharge Instructions     Discharge instructions   Complete by: As directed    Soft diet-- return to prior diet as tolerated Bowel regimen to avoid constipation   Increase activity slowly   Complete by: As directed       Allergies as of 03/13/2022       Reactions   Bupropion Hcl Swelling   Celecoxib Swelling   Naproxen Swelling   Methadone Nausea And Vomiting   Morphine Itching        Medication List     STOP taking these medications    HYDROcodone-acetaminophen 10-325 MG tablet Commonly known as: NORCO       TAKE these medications    amoxicillin-clavulanate 875-125 MG tablet Commonly known as: AUGMENTIN Take 1 tablet by mouth every 12 (twelve) hours.   aspirin EC 81 MG tablet Take 81 mg by mouth at bedtime.   atenolol 25 MG tablet Commonly known as: TENORMIN TAKE 1/2 TABLET BY MOUTH 2 TIMES DAILY. What changed: See the new instructions.   atorvastatin 40 MG tablet Commonly known as: LIPITOR TAKE 1 TABLET BY MOUTH DAILY   baclofen 20 MG tablet Commonly known as: LIORESAL Take 20 mg by mouth See admin  instructions. Take 1 tablet by mouth every 6 to 8 hours as needed for muscle spasms   CALCIUM + D PO Take 1 tablet by mouth 2 (two) times daily. Calcium Magnesium Zinc and Vitamin D   clonazePAM 1 MG tablet Commonly known as: KLONOPIN Take 1 mg by mouth at bedtime. For sleep   fish oil-omega-3 fatty acids 1000 MG capsule Take 1 g by mouth at bedtime.   furosemide 20 MG tablet Commonly known as: LASIX TAKE 1 TABLET BY MOUTH DAILY   METAMUCIL SMOOTH TEXTURE PO Take 1 packet by mouth 2 (two) times daily. Mix with 8 ounces of water   multivitamin with minerals Tabs  tablet Take 1 tablet by mouth daily.   omeprazole 20 MG capsule Commonly known as: PRILOSEC Take 20 mg by mouth daily.   oxyCODONE 5 MG immediate release tablet Commonly known as: Oxy IR/ROXICODONE Take 1 tablet (5 mg total) by mouth every 4 (four) hours as needed for moderate pain.   polyethylene glycol 17 g packet Commonly known as: MIRALAX / GLYCOLAX Take 17 g by mouth daily as needed for moderate constipation.   verapamil 240 MG CR tablet Commonly known as: CALAN-SR TAKE 1 TABLET BY MOUTH AT BEDTIME   Vitamin D3 125 MCG (5000 UT) Tabs Take 5,000 Units by mouth daily at 12 noon.        Follow-up Information     Raina Mina., MD.   Specialty: Internal Medicine Contact information: [place] [address] [phone]                  Time coordinating discharge: 45 min  Signed:  Geradine Girt DO  [facility] 03/13/2022, 8:49 AM

## 2022-03-13 NOTE — Progress Notes (Signed)
PIV removed. Discharge instructions completed. Patient verbalized understanding of medication regimen, follow up appointments and discharge instructions. Patient belongings gathered and packed to discharge.  

## 2022-08-17 ENCOUNTER — Other Ambulatory Visit: Payer: Self-pay | Admitting: Cardiovascular Disease

## 2022-08-17 NOTE — Telephone Encounter (Signed)
Rx request sent to pharmacy.  

## 2023-01-01 ENCOUNTER — Ambulatory Visit (HOSPITAL_COMMUNITY)
Admission: RE | Admit: 2023-01-01 | Discharge: 2023-01-01 | Disposition: A | Discharge status: Home / Self Care | Payer: Medicare Other | Source: Ambulatory Visit | Attending: Acute Care | Admitting: Acute Care

## 2023-01-01 DIAGNOSIS — Z122 Encounter for screening for malignant neoplasm of respiratory organs: Secondary | ICD-10-CM | POA: Insufficient documentation

## 2023-01-01 DIAGNOSIS — Z87891 Personal history of nicotine dependence: Secondary | ICD-10-CM | POA: Insufficient documentation

## 2023-01-14 ENCOUNTER — Other Ambulatory Visit: Payer: Self-pay

## 2023-01-14 DIAGNOSIS — Z87891 Personal history of nicotine dependence: Secondary | ICD-10-CM

## 2023-01-14 DIAGNOSIS — Z122 Encounter for screening for malignant neoplasm of respiratory organs: Secondary | ICD-10-CM

## 2023-02-15 ENCOUNTER — Other Ambulatory Visit: Payer: Self-pay | Admitting: Cardiovascular Disease

## 2023-03-01 ENCOUNTER — Other Ambulatory Visit: Payer: Self-pay | Admitting: Cardiovascular Disease

## 2023-03-03 ENCOUNTER — Other Ambulatory Visit: Payer: Self-pay | Admitting: Cardiovascular Disease

## 2023-03-18 ENCOUNTER — Encounter: Payer: Self-pay | Admitting: Cardiovascular Disease

## 2023-03-18 ENCOUNTER — Ambulatory Visit: Payer: Medicare Other | Attending: Cardiovascular Disease | Admitting: Cardiovascular Disease

## 2023-03-18 DIAGNOSIS — Z87891 Personal history of nicotine dependence: Secondary | ICD-10-CM

## 2023-03-18 DIAGNOSIS — E785 Hyperlipidemia, unspecified: Secondary | ICD-10-CM

## 2023-03-18 DIAGNOSIS — Z8679 Personal history of other diseases of the circulatory system: Secondary | ICD-10-CM | POA: Diagnosis not present

## 2023-03-18 DIAGNOSIS — M7989 Other specified soft tissue disorders: Secondary | ICD-10-CM | POA: Diagnosis not present

## 2023-03-18 DIAGNOSIS — I2510 Atherosclerotic heart disease of native coronary artery without angina pectoris: Secondary | ICD-10-CM | POA: Diagnosis not present

## 2023-03-18 DIAGNOSIS — Z933 Colostomy status: Secondary | ICD-10-CM

## 2023-03-18 DIAGNOSIS — J432 Centrilobular emphysema: Secondary | ICD-10-CM

## 2023-03-18 MED ORDER — EZETIMIBE 10 MG PO TABS: 10.0000 mg | ORAL_TABLET | Freq: Every day | ORAL | 3 refills | Status: DC

## 2023-03-18 NOTE — Patient Instructions (Addendum)
Medication Instructions:  Begin Zetia 10mg  daily.   Your medication and refills have been sent to your pharmacy.  Purchase compression stockings for the edema.   *If you need a refill on your cardiac medications before your next appointment, please call your pharmacy*   Lab Work: Return for fasting labs. CMET, LIPID, LPa If you have labs (blood work) drawn today and your tests are completely normal, you will receive your results only by: MyChart Message (if you have MyChart) OR A paper copy in the mail If you have any lab test that is abnormal or we need to change your treatment, we will call you to review the results.   Testing/Procedures:  CT coronary calcium score.   Test locations:  [facility]  [place] [facility]  This is $99 out of pocket.   Coronary CalciumScan A coronary calcium scan is an imaging test used to look for deposits of calcium and other fatty materials (plaques) in the inner lining of the blood vessels of the heart (coronary arteries). These deposits of calcium and plaques can partly clog and narrow the coronary arteries without producing any symptoms or warning signs. This puts a person at risk for a heart attack. This test can detect these deposits before symptoms develop. Tell a health care provider about: Any allergies you have. All medicines you are taking, including vitamins, herbs, eye drops, creams, and over-the-counter medicines. Any problems you or family members have had with anesthetic medicines. Any blood disorders you have. Any surgeries you have had. Any medical conditions you have. Whether you are pregnant or may be pregnant. What are the risks? Generally, this is a safe procedure. However, problems may occur, including: Harm to a pregnant woman and her unborn baby. This test involves the use of radiation. Radiation exposure can be dangerous to a pregnant  woman and her unborn baby. If you are pregnant, you generally should not have this procedure done. Slight increase in the risk of cancer. This is because of the radiation involved in the test. What happens before the procedure? No preparation is needed for this procedure. What happens during the procedure? You will undress and remove any jewelry around your neck or chest. You will put on a hospital gown. Sticky electrodes will be placed on your chest. The electrodes will be connected to an electrocardiogram (ECG) machine to record a tracing of the electrical activity of your heart. A CT scanner will take pictures of your heart. During this time, you will be asked to lie still and hold your breath for 2-3 seconds while a picture of your heart is being taken. The procedure may vary among health care providers and hospitals. What happens after the procedure? You can get dressed. You can return to your normal activities. It is up to you to get the results of your test. Ask your health care provider, or the department that is doing the test, when your results will be ready. Summary A coronary calcium scan is an imaging test used to look for deposits of calcium and other fatty materials (plaques) in the inner lining of the blood vessels of the heart (coronary arteries). Generally, this is a safe procedure. Tell your health care provider if you are pregnant or may be pregnant. No preparation is needed for this procedure. A CT scanner will take pictures of your heart. You can return to your normal activities after the scan is done. This information  is not intended to replace advice given to you by your health care provider. Make sure you discuss any questions you have with your health care provider. Document Released: 10/10/2007 Document Revised: 03/02/2016 Document Reviewed: 03/02/2016 Elsevier Interactive Patient Education  2017 [employer].    Follow-Up: At [facility], you and your  health needs are our priority.  As part of our continuing mission to provide you with exceptional heart care, we have created designated Provider Care Teams.  These Care Teams include your primary Cardiologist (physician) and Advanced Practice Providers (APPs -  Physician Assistants and Nurse Practitioners) who all work together to provide you with the care you need, when you need it.  We recommend signing up for the patient portal called "MyChart".  Sign up information is provided on this After Visit Summary.  MyChart is used to connect with patients for Virtual Visits (Telemedicine).  Patients are able to view lab/test results, encounter notes, upcoming appointments, etc.  Non-urgent messages can be sent to your provider as well.   To learn more about what you can do with MyChart, go to [email or web address].    Your next appointment:   3 month(s)  Provider:   Nicki Guadalajara, MD

## 2023-03-18 NOTE — Progress Notes (Signed)
Patient ID: [name], female   DOB: 1950/07/02, 72 y.o.   MRN: [id number]     PCP: Dr. Feliciana Rossetti  HPI: [name] is a 72 y.o. female resents to the office today for a 12 month followup cardiology evaluation.   [name] has a history of supraventricular tachycardia which has been fairly well-controlled on combination verapamil SR  plus beta blocker therapy with atenolol.  She has a history of underlying mitral valve prolapse, mild carotid disease, hyperlipidemia, as well as mild COPD. She had smoked for over 30 years but quit smoking.  She was hospitalized after being bitten by a copperhead snake and required 5 days in the hospital. Apparently her cardiac medicines were held at that time and during the hospitalization she had recurrent episodes of tachycardia dysrhythmia. As long as she takes her medicines, and she feels that her heart rhythm is controlled. There is rare intermittent palpitations.typically, these episodes may last for under a minute and typically resolve with the Valsalva maneuver if they don't resolve spontaneously.  When I saw her in 2017 she was unaware of any breakthrough arrhythmias.  At that time her blood pressure was low and there was mild orthostatic drop.  Her resting pulse was 60.  I recommended that she continue to take Verapamil SR 240 mg but I reduced her atenolol dose to 12.5  mg twice a day.  She underwent a follow-up echo Doppler study on 10/25/2015.  This showed an EF of 60-65%.  There was normal wall motion.  There was no significant valvular pathology.  She was given preoperative clearance to undergo hip surgery by Dr. Renaye Rakers which was done in July 2017 and she tolerated this well.  I last saw her, she had undergone  a panoramic dental x-ray.  Incidentally, she was noted to have calcified carotid atheromas of the internal carotid artery suggestive of atherosclerotic disease.  I scheduled her for carotid duplex imaging which showed heterogeneous  plaque bilaterally with narrowing in the 1-39% range.  She had normal subclavian arteries, and patent vertebral arteries with antegrade flow.  She resumed smoking after being off cigarettes for 2 years.  She resumed smoking after a death of a family member.    I saw her in May 2019 after not having seen her in over a year., she denies any episodes of chest pain or awareness of palpitations.  She was concerned about her weight loss.  She had resumed smoking but quit 1 month previous to her last evaluation.  Her smoking duration has been over 40 years.  She admits to exertional dyspnea.  She denies any prolonged cough.  In July 2018, LDL cholesterol was 78 on atorvastatin and only 5 mg.  She has continued to be on atenolol 12.5 mg twice a day and verapamil 240 mg daily.  She is on aspirin.  In December 2018 carotid duplex imaging showed mild bilateral carotid plaque which was not hemodynamically significant.  Due to weight loss I referred her to [name], [name] of pulmonary so that she would qualify for a low-dose CT of her chest to evaluate for possible lung CA screening protocol.  This apparently did not show any cancer but there was aortic atherosclerosis in addition to left main and two-vessel CAD noted on the CT scan.  She had mild diffuse bronchial wall thickening with mild centrilobular and paraseptal emphysema, with imaging findings suggestive of underlying COPD.  When I saw her in January 27, 2019 she previously  had an episode  of food poisoning associated with dehydration and small bowel obstruction leading to a hospitalization.  She admits to weight loss.  She denied any anginal type symptoms.  She was continuing to smoke cigarettes.  She is now set up to undergo yearly lung radiation CT of her chest for lung CA screening.Marland Kitchen  She was evaluated by me in October 2021.  Since her prior evaluation she continued to feel well.  She was still smoking  smoking and her smoking has been reduced to 5  cigarettes/day.  She had undergone laboratory by Dr. Rene Paci in April 2021 which showed a total cholesterol of 129, LDL 74 HDL 20 triglycerides 68.  She walks 2 miles per day typically drinks 5 to 7 days/week.  Back in December 19, 2019 she underwent a yearly follow-up CT of her chest was cancer screening.  This essentially was unchanged and showed benign appearance.  She again was noted to have her centrilobular emphysema.  There was aortic atherosclerosis as well as coronary artery atherosclerosis.    I saw her on Sep 19, 2020 and since her prior evaluation she had remained stable without chest pain but admitted to shortness of breath with activity.  She has documented centrilobular emphysema.  She has been trying to walk up to 2 miles per day.  Over the prior 3 weeks she noticed development of lower extremity bilateral edema left greater than right which is pitting.  She was still smoking approximately 4 cigarettes/day. She had undergone laboratory by her primary physician.  Several months ago her atorvastatin was increased to 20 mg.  LDL on her increased atorvastatin on August 15, 2020 was 88 with total cholesterol 131, HDL 36 and triglycerides 79.  Chemistry was normal.  With her documented coronary calcification and aortic atherosclerosis I recommended more aggressive lipid management with target LDL in the 50s and 60s if at all possible.  As result atorvastatin was increased to 40 mg daily.  She was experiencing some leg edema and initiated HCTZ 12.5 mg daily and if after 1 week edema was still present she could increase this to 25 mg as needed and go back to 12.5 mg if resolved.  I saw her in follow-up on November 20, 2020.  Since her last evaluation she was successful in smoking cessation and quit tobacco the day after her office visit on Sep 20, 2020.  She has continued to experience pretibial edema despite taking HCTZ at 25 mg.  She denies chest pain.  She is tolerating rosuvastatin.  She underwent a  Lexiscan Myoview study which was low risk and revealed hyperdynamic LV function with EF greater than 65% and no evidence for scar or ischemia.  With her continued leg edema despite taking HCTZ, I recommended she discontinue hydrochlorothiazide and initiated furosemide 20 mg daily.  She continues to be on verapamil 2040 mg and atenolol 12.5 mg twice a day and her blood pressure was stable.  When I saw her on March 25, 2021 she continued to be smoke-free.    Her swelling significantly improved with furosemide but at times she may note trace ankle to pretibial edema.  She is now walking 1 to 2 miles per day.  She denies chest pain or palpitations.    She has a history of rectal prolapse and is status post diverting colostomy.  On January 25, 2022 she had a bowel obstruction which time her medications were held.  She was hospitalized from October 1 through February 03, 2022  and on February 06, 2022 had a follow-up ostomy clinic visit note [name], [name].  I last saw her on March 05, 2022.  She notes significant improvement in her breathing since she quit tobacco in on Sep 20, 2020.  She denied any chest pain or shortness of breath.  Of note, when her medications were held with her bowel obstruction, her heart rate had increased to the 170s.  Presently, she is now on atenolol 12.5 mg twice a day, furosemide 20 mg, verapamil 250 mg daily and continues to be on atorvastatin 40 mg, omega-3 fatty acid 1 g.  She is on Prilosec.    Presently, [name] is feels well and denies chest pain or shortness of breath.  She does have occasional back discomfort.  She is status post hip replacement surgery and had her colostomy originally placed at [facility].  She admits to lower extremity edema and uses compression stockings due to distal leg and ankle edema.  On January 01, 2023 she had undergone CT chest lungs CA screen evaluation.  She was found to have aortic atherosclerosis in addition to two-vessel coronary artery disease.   There was mild diffuse bronchial wall thickening with mild centrilobular and paraseptal emphysema with image findings suggestive of underlying COPD.  Presently, she is on atorvastatin 40 mg and over-the-counter omega 3 fatty acid.  She is on atenolol 12.5 mg twice a day, verapamil 240 mg at bedtime and takes furosemide 20 mg daily for hypertension and swelling.  She is on a baby aspirin.  She presents for evaluation.   Past Medical History:  Diagnosis Date   Anxiety    Back pain, chronic    Bowel obstruction (HCC)    Complication of anesthesia    PT HAS POST OP URINARY RETENTION:  per patient "difficult stick- needs central line or picc"  04-06-2017 AT [facility] IV STARTED BY CRNA NO ISSUE   COPD with emphysema (HCC)    DDD (degenerative disc disease), cervical    POST MULTIPLE SURGERY'S   DDD (degenerative disc disease), lumbosacral    POST MULTIPLE SURGERY'S   Difficult intravenous access    Fecal incontinence    once a week----  TREATED W/ SACRAL NERVE STIMULATOR PLACED 12/ 2018   Heart murmur    History of chronic bronchitis    History of spinal cord injury 02/09/2007   PT FELL-- S/P  CERVICAL SPINE SURGERY   History of urinary retention    PT HAS POST OP URINARY RETENTION DUE TO ANESTHESIA   History of venomous snake bite 2013   RIGHT MIDDLE FINGER BY COOPERHEAD--- TREATMENT W/ ANTIVENOM   Hyperlipemia    IBS (irritable bowel syndrome)    flairs up occasionally esp if stressed   Incomplete right bundle branch block    Major depression    Mild carotid artery disease [facility]) cardiologist-  dr Tresa Endo   mild bilateral ICA per duplex 04-12-2017  1-39%   MVP (mitral valve prolapse)    no evidence stenosis or regurgitation per last echo 10-25-2015   OA (osteoarthritis)    PONV (postoperative nausea and vomiting)    Pre-diabetes    PSVT (paroxysmal supraventricular tachycardia) [facility])    cardiologist-- dr Tresa Endo   SBO (small bowel obstruction) (HCC) 01/08/2019   SVT (supraventricular  tachycardia) (HCC) 02/27/2014    Past Surgical History:  Procedure Laterality Date   ABDOMINAL RECTOPEXY  2011       dr fuller  [facility])   prolapse rectum   ANAL RECTAL MANOMETRY  N/A 03/01/2017   Procedure: ANO RECTAL MANOMETRY;  Surgeon: Romie Levee, MD;  Location: [facility];  Service: Endoscopy;  Laterality: N/A;   ANTERIOR CERVICAL DECOMP/DISCECTOMY FUSION  05-28-2000    dr Otelia Sergeant   C3-4  &  C7-T1   ANTERIOR CERVICAL DECOMPRESSION LAMINECTOMY AND FUSION  02-09-2007    dr Lovell Sheehan   decompression/ laminectomy C3-4 & C5-6;  laminectomy C2; fusion C2-5   BREAST SURGERY  1976   breast reduction   COLONOSCOPY     FORAMINOTOMY 1 LEVEL  11-03-1999   dr Otelia Sergeant  [facility]   left C7-T1 w/ decompression left C8   KNEE SURGERY  AGE 71   bone spur     LUMBAR LAMINECTOMY/ DECOMPRESSION WITH MET-RX  02-09-2005  dr Havery Moros   L2-3 laminectomy left cage; fusion L2-4   POSTERIOR CERVICAL FUSION/FORAMINOTOMY  12-08-2000    dr Otelia Sergeant   right forminotomy C7-T1 and posterior fusion   RIGHT THUMB COLLATERAL LIGAMENT RECONSTRUCTION  02-08-2003   dr Amanda Pea   RIGHT THUMB LIGAMENT REPAIR, ARTHROTOMY, SYNOVECTOMY  10-26-2002    dr Amanda Pea   SPINE SURGERY  1983 to 01/2007   total 14 surgery's  cervical and lumbar  (including fusion's, diskectomy, laminectomy's, foraminotomy's)   TONSILLECTOMY     TOTAL HIP ARTHROPLASTY Right 07/31/2014   Procedure: TOTAL HIP ARTHROPLASTY ANTERIOR APPROACH;  Surgeon: Sheral Apley, MD;  Location: [facility] OR;  Service: Orthopedics;  Laterality: Right;   TOTAL HIP ARTHROPLASTY Left 11/19/2015   Procedure: TOTAL HIP ARTHROPLASTY ANTERIOR APPROACH;  Surgeon: Sheral Apley, MD;  Location: [facility] OR;  Service: Orthopedics;  Laterality: Left;   TRANSTHORACIC ECHOCARDIOGRAM  10-25-2015   dr t. Tresa Endo   ef 60-655/  no evidence MV stenosis or regurgitation/     TUBAL LIGATION      Allergies  Allergen Reactions   Bupropion Hcl Swelling   Celecoxib Swelling   Naproxen Swelling    Methadone Nausea And Vomiting   Morphine Itching    Current Outpatient Medications  Medication Sig Dispense Refill   aspirin EC 81 MG tablet Take 81 mg by mouth at bedtime.     atenolol (TENORMIN) 25 MG tablet TAKE 1/2 TABLET BY MOUTH TWICE DAILY 90 tablet 0   atorvastatin (LIPITOR) 40 MG tablet TAKE 1 TABLET BY MOUTH DAILY 90 tablet 0   baclofen (LIORESAL) 20 MG tablet Take 20 mg by mouth See admin instructions. Take 1 tablet by mouth every 6 to 8 hours as needed for muscle spasms     Calcium Carbonate-Vitamin D (CALCIUM + D PO) Take 1 tablet by mouth 2 (two) times daily. Calcium Magnesium Zinc and Vitamin D     Cholecalciferol (VITAMIN D3) 125 MCG (5000 UT) TABS Take 5,000 Units by mouth daily at 12 noon.     clonazePAM (KLONOPIN) 1 MG tablet Take 1 mg by mouth at bedtime. For sleep     ezetimibe (ZETIA) 10 MG tablet Take 1 tablet (10 mg total) by mouth daily. 90 tablet 3   fish oil-omega-3 fatty acids 1000 MG capsule Take 1 g by mouth at bedtime.      furosemide (LASIX) 20 MG tablet TAKE 1 TABLET BY MOUTH DAILY 30 tablet 0   Multiple Vitamin (MULTIVITAMIN WITH MINERALS) TABS tablet Take 1 tablet by mouth daily.     omeprazole (PRILOSEC) 20 MG capsule Take 20 mg by mouth daily.     oxyCODONE (OXY IR/ROXICODONE) 5 MG immediate release tablet Take 1 tablet (5 mg total) by mouth every  4 (four) hours as needed for moderate pain. (Patient taking differently: Take 10 mg by mouth every 4 (four) hours as needed for moderate pain (pain score 4-6).) 12 tablet 0   polyethylene glycol (MIRALAX / GLYCOLAX) 17 g packet Take 17 g by mouth daily as needed for moderate constipation.     Psyllium (METAMUCIL SMOOTH TEXTURE PO) Take 1 packet by mouth 2 (two) times daily. Mix with 8 ounces of water     verapamil (CALAN-SR) 240 MG CR tablet TAKE 1 TABLET BY MOUTH AT BEDTIME 30 tablet 0   No current facility-administered medications for this visit.    Social History   Socioeconomic History   Marital status:  Married    Spouse name: CARROLL   Number of children: 0   Years of education: 16   Highest education level: Bachelor's degree (e.g., BA, AB, BS)  Occupational History   Occupation: Nursing-RN    RETIRED    Employer: [employer]  Tobacco Use   Smoking status: Former    Current packs/day: 0.00    Average packs/day: 0.8 packs/day for 42.0 years (31.5 ttl pk-yrs)    Types: Cigarettes    Start date: 08/09/1975    Quit date: 08/08/2017    Years since quitting: 5.6   Smokeless tobacco: Never  Vaping Use   Vaping status: Never Used  Substance and Sexual Activity   Alcohol use: Not Currently    Alcohol/week: 5.0 - 6.0 standard drinks of alcohol    Types: 5 - 6 Glasses of wine per week    Comment: no longer drinks-stopped approx 6 yrs ago   Drug use: No   Sexual activity: Yes    Birth control/protection: None  Other Topics Concern   Not on file  Social History Narrative   Not on file   Social Determinants of Health   Financial Resource Strain: Low Risk  (04/01/2021)   [facility] visits prior to 06/27/2022., [facility] visits prior to 06/27/2022.   Overall Financial Resource Strain (CARDIA)    Difficulty of Paying Living Expenses: Not very hard  Food Insecurity: Low Risk  (01/26/2023)   [facility]   Hunger Vital Sign    Worried About Running Out of Food in the Last Year: Never true    Ran Out of Food in the Last Year: Never true  Transportation Needs: No Transportation Needs (01/26/2023)   Received from [facility]    In the past 12 months, has lack of reliable transportation kept you from medical appointments, meetings, work or from getting things needed for daily living? : No  Physical Activity: Sufficiently Active (04/01/2021)   Received from [facility] visits prior to 06/27/2022., [facility] visits prior to 06/27/2022.   Exercise Vital Sign    Days  of Exercise per Week: 5 days    Minutes of Exercise per Session: 60 min  Stress: No Stress Concern Present (04/01/2021)   [facility] prior to 06/27/2022., [facility] visits prior to 06/27/2022.   [employer] of Occupational Health - Occupational Stress Questionnaire    Feeling of Stress : Only a little  Social Connections: Moderately Integrated (04/01/2021)   Received from [facility] visits prior to 06/27/2022., [facility] visits prior to 06/27/2022.   Social Connection and Isolation Panel [NHANES]  Frequency of Communication with Friends and Family: More than three times a week    Frequency of Social Gatherings with Friends and Family: Patient refused    Attends Religious Services: Never    Database administrator or Organizations: Yes    Attends Banker Meetings: 1 to 4 times per year    Marital Status: Married  Catering manager Violence: Not At Risk (03/30/2022)   [facility] prior to 06/27/2022., [facility] visits prior to 06/27/2022.   Humiliation, Afraid, Rape, and Kick questionnaire    Fear of Current or Ex-Partner: No    Emotionally Abused: No    Physically Abused: No    Sexually Abused: No   Social history is notable in that she is married. She has one stepchild and 2 stepgrandchildren. She does walk.  Fortunately she quit tobacco on Sep 20, 2020  Family History  Problem Relation Age of Onset   COPD Father    Diabetes Mother    Heart disease Mother    Kidney disease Mother    Hypertension Mother     ROS [name]: Negative; No fevers, chills, or night sweats;  HEENT: Negative; No changes in vision or hearing, sinus congestion, difficulty swallowing Pulmonary: positive for mild COPD; No cough, wheezing, shortness of breath, hemoptysis Cardiovascular: See HPI GI: Positive for irritable bowel  syndrome; history of rectal prolapse status post diverting colostomy and subsequent small bowel obstruction. GU: Negative; No dysuria, hematuria, or difficulty voiding Musculoskeletal: Status post left hip replacement surgery. Hematologic/Oncology: Negative; no easy bruising, bleeding Endocrine: Negative; no heat/cold intolerance; no diabetes Neuro: Negative; no changes in balance, headaches Skin: Negative; No rashes or skin lesions Psychiatric: positive for mild depression for which she takes Zoloft.; No behavioral problems,  Sleep: Negative; No snoring, daytime sleepiness, hypersomnolence, bruxism, restless legs, hypnogognic hallucinations, no cataplexy Other comprehensive 14 point system review is negative.   PE BP 114/78   Pulse 62   Ht 5\' 7"  (1.702 m)   Wt 118 lb 3.2 oz (53.6 kg)   SpO2 94%   BMI 18.51 kg/m    Repeat  blood pressure by me was 130/70  Wt Readings from Last 3 Encounters:  03/18/23 118 lb 3.2 oz (53.6 kg)  03/05/22 112 lb 14.4 oz (51.2 kg)  03/25/21 117 lb 3.2 oz (53.2 kg)   [name]: Alert, oriented, no distress.  Skin: normal turgor, no rashes, warm and dry HEENT: Normocephalic, atraumatic. Pupils equal round and reactive to light; sclera anicteric; extraocular muscles intact;  Nose without nasal septal hypertrophy Mouth/Parynx benign; Mallinpatti scale 2 Neck: No JVD, no carotid bruits; normal carotid upstroke Lungs: clear to ausculatation and percussion; no wheezing or rales Chest wall: without tenderness to palpitation Heart: PMI not displaced, RRR, s1 s2 normal, 1/6 systolic murmur, no diastolic murmur, no rubs, gallops, thrills, or heaves Abdomen: colostomy left lower quadrant ; soft, nontender; no hepatosplenomehaly, BS+; abdominal aorta nontender and not dilated by palpation. Back: no CVA tenderness Pulses 2+ Musculoskeletal: full range of motion, normal strength, no joint deformities Extremities: no clubbing cyanosis or edema, Homan's sign negative   Neurologic: grossly nonfocal; Cranial nerves grossly wnl Psychologic: Normal mood and affect   EKG Interpretation Date/Time:  Thursday March 18 2023 14:17:03 EST Ventricular Rate:  62 PR Interval:  164 QRS Duration:  94 QT Interval:  396 QTC Calculation: 401 R Axis:   -61  Text Interpretation: Normal sinus rhythm Left axis deviation Incomplete right  bundle branch block Septal infarct , age undetermined When compared with ECG of 02-Sep-2015 21:27, PREVIOUS ECG IS PRESENT Confirmed by Nicki Guadalajara ([id number]) on 03/18/2023 3:10:15 PM    March 05, 2022 ECG (independently read by me): NSR at 86, Biatrial enlargement, LAD QS v1-2  March 25, 2021 ECG (independently read by me):  Sinus bradycardia at 59; QS V1-3  November 20, 2020 ECG (independently read by me):  Sinus bradycardia at 53; PR 200 msec, no ectopy  Sep 19, 2020 ECG (independently read by me): NSR at 61; LAD      October 2021 ECG (independently read by me): NSR at 79; QS V1-3; no ectopy  February 02, 2020 ECG (independently read by me): NSR at 71; Probable bistrial enlargement QS V1-26 Aug 2017 ECG (independently read by me): Sinus bradycardia 55 bpm.  QS complex V1 V2.  Normal intervals.  No ectopy.  May 2018 ECG (independently read by me): Sinus bradycardia 56 bpm.  QS V1, V2.  Mild RV conduction delay.  Normal intervals.  No ST segment changes.  November 2017 ECG (independently read by me): Sinus bradycardia 52 bpm.  QRS complex V1 V2.  Normal intervals.  June 2017 ECG (independently read by me): Normal sinus rhythm at 60 bpm.  First-degree AV block with a PR interval of 220 ms.  No significant ST changes.  Incomplete right bundle branch block.   November 2016 ECG (independently read by me): normal sinus rhythm with first-degree heart block with a PR interval at 270 ms.  QRS complex V1 V2.  November 2015 ECG (independently read by me.  (: Normal sinus rhythm at 64 bpm.  Borderline first degree AV block with a PR interval  at 204 ms.  Mild RV conduction delay.  Anteroseptal Q waves, unchanged  October 2014ECG: Sinus rhythm with incomplete right bundle branch block. Anteroseptal Q waves unchanged.  LABS:     Latest Ref Rng & Units 03/11/2022    5:48 AM 03/10/2022    4:59 AM 03/09/2022    3:15 PM  BMP  Glucose 70 - 99 mg/dL 71  604  540   BUN 8 - 23 mg/dL 9  11  14    Creatinine 0.44 - 1.00 mg/dL 9.81  1.91  4.78   Sodium 135 - 145 mmol/L 140  141  141   Potassium 3.5 - 5.1 mmol/L 3.8  3.6  3.7   Chloride 98 - 111 mmol/L 107  107  105   CO2 22 - 32 mmol/L 20  26  25    Calcium 8.9 - 10.3 mg/dL 8.8  9.4  9.4       Latest Ref Rng & Units 03/11/2022    5:48 AM 03/10/2022    4:59 AM 03/09/2022    3:15 PM  Hepatic Function  Total Protein 6.5 - 8.1 g/dL 5.3  6.0  6.9   Albumin 3.5 - 5.0 g/dL 2.7  3.1  3.7   AST 15 - 41 U/L 28  22  27    ALT 0 - 44 U/L 14  15  17    Alk Phosphatase 38 - 126 U/L 62  69  89   Total Bilirubin 0.3 - 1.2 mg/dL 0.8  0.5  0.5       Latest Ref Rng & Units 03/11/2022    5:48 AM 03/10/2022    4:59 AM 03/09/2022    3:15 PM  CBC  WBC 4.0 - 10.5 K/uL 3.8  4.6  6.3   Hemoglobin 12.0 -  15.0 g/dL 91.4  78.2  95.6   Hematocrit 36.0 - 46.0 % 36.4  39.1  43.7   Platelets 150 - 400 K/uL 269  327  383    Lab Results  Component Value Date   MCV 98.1 03/11/2022   MCV 99.0 03/10/2022   MCV 99.3 03/09/2022   Lab Results  Component Value Date   TSH 1.010 11/03/2016   Lipid Panel     Component Value Date/Time   CHOL 130 11/11/2020 1024   TRIG 53 11/11/2020 1024   HDL 55 11/11/2020 1024   CHOLHDL 2.4 11/11/2020 1024   CHOLHDL 3.3 02/28/2015 1023   VLDL 30 02/28/2015 1023   LDLCALC 63 11/11/2020 1024     RADIOLOGY: No results found.  IMPRESSION:  1. History of PSVT (paroxysmal supraventricular tachycardia)   2. Coronary artery calcification seen on CAT scan   3. Leg swelling   4. Hyperlipidemia with target LDL less than 70   5. Centrilobular emphysema (HCC)   6. S/P  colostomy (HCC)   7. Long history of tobacco abuse: Quit Sep 20, 2020    ASSESSMENT AND PLAN: [name] is a 72 year-old female who has a history of SVT which had been fairly well-controlled over many years on verapamil 240 mg as well as atenolol 12.5 mg twice a day and remained stable without breakthrough SVT episodes.  She had a greater than 40-year history of tobacco use and fortunately quit tobacco on Sep 20, 2020.  Since that time, her breathing has significantly improved.  He has been documented to have normal LV function and in August 2020 myocardial perfusion study showed normal perfusion without ischemia or infarction.  Documented to have centrilobular and paraseptal emphysema with underlying COPD.  He has documented multivessel coronary calcification and her last nuclear perfusion study in June 2022 showed apical thinning felt to be artifactual without scar or ischemia.  She underwent colostomy on December 16, 2021.  Unfortunately she developed a bowel obstruction leading to hospitalization from October 1 through February 03, 2022.  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  Presently she continues to be back on atenolol 25 mg twice a day and verapamil 240 mg daily without breakthrough SVT or tachycardic episodes.  She does experience swelling in the distal leg and ankle left greater than right for which she takes furosemide and uses support stockings.  Blood pressure today is stable on her current regimen.  I am recommending that she undergo coronary calcium score for assessment of her coronary calcification.  She is not having any anginal symptomatology.  I am empirically adding Zetia 10 mg to atorvastatin 40 mg.  I have also recommended LP(a) be checked with follow-up chemistry and fasting lipid panel in 3 months.  If she does have significant coronary calcification she will then be referred for coronary CTA  for more extensive assessment.  I will see her in 3 to 4 months for follow-up evaluation.   [name], MD, [facility]  03/27/2023 6:16 PM

## 2023-03-23 ENCOUNTER — Ambulatory Visit (HOSPITAL_COMMUNITY)
Admission: RE | Admit: 2023-03-23 | Discharge: 2023-03-23 | Disposition: A | Discharge status: Home / Self Care | Payer: Medicare Other | Source: Ambulatory Visit | Attending: Cardiovascular Disease | Admitting: Cardiovascular Disease

## 2023-03-23 DIAGNOSIS — E785 Hyperlipidemia, unspecified: Secondary | ICD-10-CM

## 2023-03-23 DIAGNOSIS — Z8679 Personal history of other diseases of the circulatory system: Secondary | ICD-10-CM

## 2023-03-23 DIAGNOSIS — I2510 Atherosclerotic heart disease of native coronary artery without angina pectoris: Secondary | ICD-10-CM

## 2023-03-27 ENCOUNTER — Encounter: Payer: Self-pay | Admitting: Cardiovascular Disease

## 2023-03-29 ENCOUNTER — Encounter: Payer: Self-pay | Admitting: Cardiovascular Disease

## 2023-03-29 ENCOUNTER — Telehealth: Payer: Self-pay | Admitting: Cardiovascular Disease

## 2023-03-29 ENCOUNTER — Other Ambulatory Visit: Payer: Self-pay | Admitting: Cardiovascular Disease

## 2023-03-29 MED ORDER — VERAPAMIL HCL ER 240 MG PO TBCR: 240.0000 mg | EXTENDED_RELEASE_TABLET | Freq: Every day | ORAL | 3 refills | Status: DC

## 2023-03-29 MED ORDER — FUROSEMIDE 20 MG PO TABS: 20.0000 mg | ORAL_TABLET | Freq: Every day | ORAL | 0 refills | Status: DC

## 2023-03-29 NOTE — Telephone Encounter (Signed)
Patient states she lives in [place] and [facility] in [place] would be more convenient for her, rather than going to the [facility] office. She would like to make sure the orders can be released in the system for [facility]. Please advise.

## 2023-03-29 NOTE — Telephone Encounter (Signed)
Spoke to patient advised ok to have lab done at [facility] in [place].Lab orders in chart have been released.

## 2023-03-29 NOTE — Telephone Encounter (Signed)
*  STAT* If patient is at the pharmacy, call can be transferred to refill team.   1. Which medications need to be refilled? (please list name of each medication and dose if known) atenolol (TENORMIN) 25 MG tablet   atorvastatin (LIPITOR) 40 MG tablet   furosemide (LASIX) 20 MG tablet   verapamil (CALAN-SR) 240 MG CR tablet   2. Which pharmacy/location (including street and city if local pharmacy) is medication to be sent to?  [facility] - [place], [place] - [address]    3. Do they need a 30 day or 90 day supply? 90

## 2023-03-29 NOTE — Telephone Encounter (Signed)
Patient is calling stating she is out of Lasix, and would like this called in asap.

## 2023-03-29 NOTE — Telephone Encounter (Signed)
Error

## 2023-03-29 NOTE — Telephone Encounter (Signed)
Furosemide and Verapamil sent to [facility]. 90 day was already sent for Atenolol and Atorvastatin

## 2023-04-01 ENCOUNTER — Telehealth: Payer: Self-pay | Admitting: Student

## 2023-04-01 NOTE — Telephone Encounter (Signed)
Telephone encounter:  Received [facility] call about critical K value of 5.9. On chart review, patient does not have a history of high K, but did recently run out of lasix.  Called patient, went straight to voicemail. Left voicemail with the following recommendations:  - Take lasix PO to lower potassium - Contact cardiology to re-order labs in order to determine if this high K was a true value (patient does not have renal dysfunction or previous high K history) - If feeling palpitations, go to urgent care as this may be related to high K   Willette Alma, MD MPH [facility]

## 2023-04-02 ENCOUNTER — Telehealth: Payer: Self-pay | Admitting: *Deleted

## 2023-04-02 ENCOUNTER — Encounter: Payer: Self-pay | Admitting: Cardiovascular Disease

## 2023-04-02 DIAGNOSIS — E875 Hyperkalemia: Secondary | ICD-10-CM

## 2023-04-02 LAB — COMPREHENSIVE METABOLIC PANEL
ALT: 35 [IU]/L — ABNORMAL HIGH (ref 0–32)
AST: 57 [IU]/L — ABNORMAL HIGH (ref 0–40)
Albumin: 4.5 g/dL (ref 3.9–4.9)
Alkaline Phosphatase: 115 [IU]/L (ref 44–121)
BUN/Creatinine Ratio: 21 (ref 12–28)
BUN: 19 mg/dL (ref 8–27)
Bilirubin Total: 0.9 mg/dL (ref 0.0–1.2)
CO2: 20 mmol/L (ref 20–29)
Calcium: 10.1 mg/dL (ref 8.7–10.3)
Chloride: 106 mmol/L (ref 96–106)
Creatinine, Ser: 0.91 mg/dL (ref 0.57–1.00)
Globulin, Total: 2.5 g/dL (ref 1.5–4.5)
Glucose: 88 mg/dL (ref 70–99)
Potassium: 5.8 mmol/L (ref 3.5–5.2)
Sodium: 145 mmol/L — ABNORMAL HIGH (ref 134–144)
Total Protein: 7 g/dL (ref 6.0–8.5)
eGFR: 68 mL/min/{1.73_m2} (ref >59)

## 2023-04-02 LAB — LIPID PANEL
Chol/HDL Ratio: 2.4 {ratio} (ref 0.0–4.4)
Cholesterol, Total: 135 mg/dL (ref 100–199)
HDL: 57 mg/dL (ref >39)
LDL Chol Calc (NIH): 60 mg/dL (ref 0–99)
Triglycerides: 94 mg/dL (ref 0–149)
VLDL Cholesterol Cal: 18 mg/dL (ref 5–40)

## 2023-04-02 LAB — LIPOPROTEIN A (LPA): Lipoprotein (a): 31.4 nmol/L (ref <75.0)

## 2023-04-02 MED ORDER — LOKELMA 10 G PO PACK: PACK | ORAL | 0 refills | Status: DC

## 2023-04-02 NOTE — Telephone Encounter (Signed)
Spoke with pt, she sent a my chart message regarding elevated potassium on recent labs. Reviewed with dr hochrein(DOD). Prescription called into the pharmacy for lokelma 10g for 2 doses. The patient will come to the office early next week for repeat lab work.

## 2023-04-03 ENCOUNTER — Encounter: Payer: Self-pay | Admitting: Cardiovascular Disease

## 2023-04-06 LAB — BASIC METABOLIC PANEL
BUN/Creatinine Ratio: 28 (ref 12–28)
BUN: 24 mg/dL (ref 8–27)
CO2: 27 mmol/L (ref 20–29)
Calcium: 9.9 mg/dL (ref 8.7–10.3)
Chloride: 103 mmol/L (ref 96–106)
Creatinine, Ser: 0.86 mg/dL (ref 0.57–1.00)
Glucose: 86 mg/dL (ref 70–99)
Potassium: 4.4 mmol/L (ref 3.5–5.2)
Sodium: 143 mmol/L (ref 134–144)
eGFR: 73 mL/min/{1.73_m2} (ref >59)

## 2023-05-24 ENCOUNTER — Other Ambulatory Visit: Payer: Self-pay | Admitting: Cardiovascular Disease

## 2023-05-31 ENCOUNTER — Other Ambulatory Visit: Payer: Self-pay | Admitting: Cardiovascular Disease

## 2023-06-21 ENCOUNTER — Ambulatory Visit: Payer: Medicare Other | Attending: Cardiovascular Disease | Admitting: Cardiovascular Disease

## 2023-06-21 ENCOUNTER — Encounter: Payer: Self-pay | Admitting: Cardiovascular Disease

## 2023-06-21 VITALS — BP 104/62 | HR 53 | Ht 67.0 in | Wt 120.8 lb

## 2023-06-21 DIAGNOSIS — E875 Hyperkalemia: Secondary | ICD-10-CM | POA: Diagnosis not present

## 2023-06-21 DIAGNOSIS — R931 Abnormal findings on diagnostic imaging of heart and coronary circulation: Secondary | ICD-10-CM

## 2023-06-21 DIAGNOSIS — M7989 Other specified soft tissue disorders: Secondary | ICD-10-CM | POA: Diagnosis not present

## 2023-06-21 DIAGNOSIS — I059 Rheumatic mitral valve disease, unspecified: Secondary | ICD-10-CM | POA: Diagnosis not present

## 2023-06-21 DIAGNOSIS — J432 Centrilobular emphysema: Secondary | ICD-10-CM

## 2023-06-21 DIAGNOSIS — Z87891 Personal history of nicotine dependence: Secondary | ICD-10-CM

## 2023-06-21 DIAGNOSIS — I440 Atrioventricular block, first degree: Secondary | ICD-10-CM

## 2023-06-21 DIAGNOSIS — Z8679 Personal history of other diseases of the circulatory system: Secondary | ICD-10-CM

## 2023-06-21 DIAGNOSIS — E785 Hyperlipidemia, unspecified: Secondary | ICD-10-CM

## 2023-06-21 DIAGNOSIS — Z933 Colostomy status: Secondary | ICD-10-CM

## 2023-06-21 MED ORDER — EZETIMIBE 10 MG PO TABS: 10.0000 mg | ORAL_TABLET | Freq: Every day | ORAL | 3 refills | Status: AC

## 2023-06-21 MED ORDER — FUROSEMIDE 20 MG PO TABS: 20.0000 mg | ORAL_TABLET | Freq: Every day | ORAL | 0 refills | Status: DC

## 2023-06-21 NOTE — Progress Notes (Unsigned)
 Patient ID: [name], female   DOB: 1950/07/07, 73 y.o.   MRN: [id number]     PCP: Dr. Feliciana Rossetti  HPI: [name] is a 73 y.o. female resents to the office today for a 3 month followup cardiology evaluation.   [name] has a history of supraventricular tachycardia which has been fairly well-controlled on combination verapamil SR  plus beta blocker therapy with atenolol.  [name] has a history of underlying mitral valve prolapse, mild carotid disease, hyperlipidemia, as well as mild COPD. [name] had smoked for over 30 years but quit smoking.  [name] was hospitalized after being bitten by a copperhead snake and required 5 days in the hospital. Apparently [name] cardiac medicines were held at that time and during the hospitalization [name] had recurrent episodes of tachycardia dysrhythmia. As long as [name] takes [name] medicines, and [name] feels that [name] heart rhythm is controlled. There is rare intermittent palpitations.typically, these episodes may last for under a minute and typically resolve with the Valsalva maneuver if they don't resolve spontaneously.  When I saw [name] in 2017 [name] was unaware of any breakthrough arrhythmias.  At that time [name] blood pressure was low and there was mild orthostatic drop.  [name] resting pulse was 60.  I recommended that [name] continue to take Verapamil SR 240 mg but I reduced [name] atenolol dose to 12.5  mg twice a day.  [name] underwent a follow-up echo Doppler study on 10/25/2015.  This showed an EF of 60-65%.  There was normal wall motion.  There was no significant valvular pathology.  [name] was given preoperative clearance to undergo hip surgery by Dr. Renaye Rakers which was done in July 2017 and [name] tolerated this well.  I last saw [name], [name] had undergone  a panoramic dental x-ray.  Incidentally, [name] was noted to have calcified carotid atheromas of the internal carotid artery suggestive of atherosclerotic disease.  I scheduled [name] for carotid duplex imaging which showed heterogeneous plaque  bilaterally with narrowing in the 1-39% range.  [name] had normal subclavian arteries, and patent vertebral arteries with antegrade flow.  [name] resumed smoking after being off cigarettes for 2 years.  [name] resumed smoking after a death of a family member.    I saw [name] in May 2019 after not having seen [name] in over a year., [name] denies any episodes of chest pain or awareness of palpitations.  [name] was concerned about [name] weight loss.  [name] had resumed smoking but quit 1 month previous to [name] last evaluation.  [name] smoking duration has been over 40 years.  [name] admits to exertional dyspnea.  [name] denies any prolonged cough.  In July 2018, LDL cholesterol was 78 on atorvastatin and only 5 mg.  [name] has continued to be on atenolol 12.5 mg twice a day and verapamil 240 mg daily.  [name] is on aspirin.  In December 2018 carotid duplex imaging showed mild bilateral carotid plaque which was not hemodynamically significant.  Due to weight loss I referred [name] to Kandice Robinsons, NP of pulmonary so that [name] would qualify for a low-dose CT of [name] chest to evaluate for possible lung CA screening protocol.  This apparently did not show any cancer but there was aortic atherosclerosis in addition to left main and two-vessel CAD noted on the CT scan.  [name] had mild diffuse bronchial wall thickening with mild centrilobular and paraseptal emphysema, with imaging findings suggestive of underlying COPD.  When I saw [name] in January 27, 2019 [name] previously  had an episode  of food poisoning associated with dehydration and small bowel obstruction leading to a hospitalization.  [name] admits to weight loss.  [name] denied any anginal type symptoms.  [name] was continuing to smoke cigarettes.  [name] is now set up to undergo yearly lung radiation CT of [name] chest for lung CA screening.Marland Kitchen  [name] was evaluated by me in October 2021.  Since [name] prior evaluation [name] continued to feel well.  [name] was still smoking  smoking and [name] smoking has been reduced to 5 cigarettes/day.  [name]  had undergone laboratory by Dr. Rene Paci in April 2021 which showed a total cholesterol of 129, LDL 74 HDL 20 triglycerides 68.  [name] walks 2 miles per day typically drinks 5 to 7 days/week.  Back in December 19, 2019 [name] underwent a yearly follow-up CT of [name] chest was cancer screening.  This essentially was unchanged and showed benign appearance.  [name] again was noted to have [name] centrilobular emphysema.  There was aortic atherosclerosis as well as coronary artery atherosclerosis.    I saw [name] on Sep 19, 2020 and since [name] prior evaluation [name] had remained stable without chest pain but admitted to shortness of breath with activity.  [name] has documented centrilobular emphysema.  [name] has been trying to walk up to 2 miles per day.  Over the prior 3 weeks [name] noticed development of lower extremity bilateral edema left greater than right which is pitting.  [name] was still smoking approximately 4 cigarettes/day. [name] had undergone laboratory by [name] primary physician.  Several months ago [name] atorvastatin was increased to 20 mg.  LDL on [name] increased atorvastatin on August 15, 2020 was 88 with total cholesterol 131, HDL 36 and triglycerides 79.  Chemistry was normal.  With [name] documented coronary calcification and aortic atherosclerosis I recommended more aggressive lipid management with target LDL in the 50s and 60s if at all possible.  As result atorvastatin was increased to 40 mg daily.  [name] was experiencing some leg edema and initiated HCTZ 12.5 mg daily and if after 1 week edema was still present [name] could increase this to 25 mg as needed and go back to 12.5 mg if resolved.  I saw [name] in follow-up on November 20, 2020.  Since [name] last evaluation [name] was successful in smoking cessation and quit tobacco the day after [name] office visit on Sep 20, 2020.  [name] has continued to experience pretibial edema despite taking HCTZ at 25 mg.  [name] denies chest pain.  [name] is tolerating rosuvastatin.  [name] underwent a Lexiscan Myoview study which  was low risk and revealed hyperdynamic LV function with EF greater than 65% and no evidence for scar or ischemia.  With [name] continued leg edema despite taking HCTZ, I recommended [name] discontinue hydrochlorothiazide and initiated furosemide 20 mg daily.  [name] continues to be on verapamil 2040 mg and atenolol 12.5 mg twice a day and [name] blood pressure was stable.  When I saw [name] on March 25, 2021 [name] continued to be smoke-free.    [name] swelling significantly improved with furosemide but at times [name] may note trace ankle to pretibial edema.  [name] is now walking 1 to 2 miles per day.  [name] denies chest pain or palpitations.    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  On January 25, 2022 [name] had a bowel obstruction which time [name] medications were held.  [name] was hospitalized from October 1 through February 03, 2022  and on February 06, 2022 had a follow-up ostomy clinic visit note Katrinka Blazing, Charity fundraiser.  I saw [name] on March 05, 2022.  [name] notes significant improvement in [name] breathing since [name] quit tobacco in on Sep 20, 2020.  [name] denied any chest pain or shortness of breath.  Of note, when [name] medications were held with [name] bowel obstruction, [name] heart rate had increased to the 170s.  Presently, [name] is now on atenolol 12.5 mg twice a day, furosemide 20 mg, verapamil 250 mg daily and continues to be on atorvastatin 40 mg, omega-3 fatty acid 1 g.  [name] is on Prilosec.    I last saw [name] in March 18, 2023 at which time [name] continued to feel well and denied any chest pain or shortness of breath.  [name] was experiencing occasional back discomfort.  [name] is status post hip replacement surgery and had [name] colostomy originally placed at [facility].  [name] admits to lower extremity edema and uses compression stockings due to distal leg and ankle edema.  On January 01, 2023 [name] had undergone CT chest lungs CA screen evaluation.  [name] was found to have aortic atherosclerosis in addition to two-vessel coronary  artery disease.  There was mild diffuse bronchial wall thickening with mild centrilobular and paraseptal emphysema with image findings suggestive of underlying COPD.  Presently, [name] is on atorvastatin 40 mg and over-the-counter omega 3 fatty acid.  [name] is on atenolol 12.5 mg twice a day, verapamil 240 mg at bedtime and takes furosemide 20 mg daily for hypertension and swelling.  [name] is on a baby aspirin.  During that evaluation, [name] blood pressure was stable.  [name] denied any recurrent palpitations on atenolol and verapamil.  I recommended [name] undergo coronary calcium score for assessment of coronary calcification.  I empirically added Zetia 10 mg to atorvastatin 40 mg and recommended [name] undergo lipoprotein a assessment.  [name] underwent CT cardiac scoring on March 23, 2023.  Coronary calcification score was 300 representing 86 percentile.  There was calcium in the left main at 64.4, LAD 235, and 0 in the circumflex and RCA at 0.696.  CT of chest showed 4 mm left lower lobe pulmonary nodule nonspecific.  There was aortic atherosclerosis.  [name] was evaluated by [name] primary physician Dr. Particia Jasper on April 12, 2023.  Presently, [name] feels well.  [name] does admit to mild edema of [name] ankles and slightly above [name] ankles.  [name] denies any chest tightness but has experienced fleeting chest twinges lasting 2 to 4 seconds which does not sound ischemic in etiology.  [name] continues to have issues with [name] lower back and L 3 and 4.  [name] presents for evaluation.   Past Medical History:  Diagnosis Date   Anxiety    Back pain, chronic    Bowel obstruction (HCC)    Complication of anesthesia    PT HAS POST OP URINARY RETENTION:  per patient "difficult stick- needs central line or picc"  04-06-2017 AT [facility] IV STARTED BY CRNA NO ISSUE   COPD with emphysema (HCC)    DDD (degenerative disc disease), cervical    POST MULTIPLE SURGERY'S   DDD (degenerative disc disease), lumbosacral    POST MULTIPLE SURGERY'S    Difficult intravenous access    Fecal incontinence    once a week----  TREATED W/ SACRAL NERVE STIMULATOR PLACED 12/ 2018   Heart murmur    History of chronic bronchitis    History of spinal cord injury 02/09/2007   PT FELL--  S/P  CERVICAL SPINE SURGERY   History of urinary retention    PT HAS POST OP URINARY RETENTION DUE TO ANESTHESIA   History of venomous snake bite 2013   RIGHT MIDDLE FINGER BY COOPERHEAD--- TREATMENT W/ ANTIVENOM   Hyperlipemia    IBS (irritable bowel syndrome)    flairs up occasionally esp if stressed   Incomplete right bundle branch block    Major depression    Mild carotid artery disease [facility]) cardiologist-  dr Tresa Endo   mild bilateral ICA per duplex 04-12-2017  1-39%   MVP (mitral valve prolapse)    no evidence stenosis or regurgitation per last echo 10-25-2015   OA (osteoarthritis)    PONV (postoperative nausea and vomiting)    Pre-diabetes    PSVT (paroxysmal supraventricular tachycardia) [facility])    cardiologist-- dr Tresa Endo   SBO (small bowel obstruction) (HCC) 01/08/2019   SVT (supraventricular tachycardia) (HCC) 02/27/2014    Past Surgical History:  Procedure Laterality Date   ABDOMINAL RECTOPEXY  2011       dr fuller  [facility])   prolapse rectum   ANAL RECTAL MANOMETRY N/A 03/01/2017   Procedure: ANO RECTAL MANOMETRY;  Surgeon: Romie Levee, MD;  Location: [facility] END[facility]COPY;  Service: Endoscopy;  Laterality: N/A;   ANTERIOR CERVICAL DECOMP/DISCECTOMY FUSION  05-28-2000    dr Otelia Sergeant   C3-4  &  C7-T1   ANTERIOR CERVICAL DECOMPRESSION LAMINECTOMY AND FUSION  02-09-2007    dr Lovell Sheehan   decompression/ laminectomy C3-4 & C5-6;  laminectomy C2; fusion C2-5   BREAST SURGERY  1976   breast reduction   COLONOSCOPY     FORAMINOTOMY 1 LEVEL  11-03-1999   dr Otelia Sergeant  [facility]   left C7-T1 w/ decompression left C8   KNEE SURGERY  AGE 72   bone spur     LUMBAR LAMINECTOMY/ DECOMPRESSION WITH MET-RX  02-09-2005  dr Havery Moros   L2-3 laminectomy left cage; fusion L2-4    POSTERIOR CERVICAL FUSION/FORAMINOTOMY  12-08-2000    dr Otelia Sergeant   right forminotomy C7-T1 and posterior fusion   RIGHT THUMB COLLATERAL LIGAMENT RECONSTRUCTION  02-08-2003   dr Amanda Pea   RIGHT THUMB LIGAMENT REPAIR, ARTHROTOMY, SYNOVECTOMY  10-26-2002    dr Amanda Pea   SPINE SURGERY  1983 to 01/2007   total 14 surgery's  cervical and lumbar  (including fusion's, diskectomy, laminectomy's, foraminotomy's)   TONSILLECTOMY     TOTAL HIP ARTHROPLASTY Right 07/31/2014   Procedure: TOTAL HIP ARTHROPLASTY ANTERIOR APPROACH;  Surgeon: Sheral Apley, MD;  Location: [facility] OR;  Service: Orthopedics;  Laterality: Right;   TOTAL HIP ARTHROPLASTY Left 11/19/2015   Procedure: TOTAL HIP ARTHROPLASTY ANTERIOR APPROACH;  Surgeon: Sheral Apley, MD;  Location: [facility] OR;  Service: Orthopedics;  Laterality: Left;   TRANSTHORACIC ECHOCARDIOGRAM  10-25-2015   dr t. Tresa Endo   ef 60-655/  no evidence MV stenosis or regurgitation/     TUBAL LIGATION      Allergies  Allergen Reactions   Bupropion Hcl Swelling   Celecoxib Swelling   Naproxen Swelling   Methadone Nausea And Vomiting   Morphine Itching    Current Outpatient Medications  Medication Sig Dispense Refill   aspirin EC 81 MG tablet Take 81 mg by mouth at bedtime.     atenolol (TENORMIN) 25 MG tablet TAKE 1/2 TABLET BY MOUTH TWICE DAILY 90 tablet 0   atorvastatin (LIPITOR) 40 MG tablet TAKE 1 TABLET BY MOUTH DAILY 90 tablet 3   baclofen (LIORESAL) 20 MG tablet Take 20 mg  by mouth See admin instructions. Take 1 tablet by mouth every 6 to 8 hours as needed for muscle spasms     Calcium Carbonate-Vitamin D (CALCIUM + D PO) Take 1 tablet by mouth 2 (two) times daily. Calcium Magnesium Zinc and Vitamin D     Cholecalciferol (VITAMIN D3) 125 MCG (5000 UT) TABS Take 5,000 Units by mouth daily at 12 noon.     clonazePAM (KLONOPIN) 1 MG tablet Take 1 mg by mouth at bedtime. For sleep     fish oil-omega-3 fatty acids 1000 MG capsule Take 1 g by mouth at bedtime.       Multiple Vitamin (MULTIVITAMIN WITH MINERALS) TABS tablet Take 1 tablet by mouth daily.     omeprazole (PRILOSEC) 20 MG capsule Take 20 mg by mouth daily.     oxyCODONE (OXY IR/ROXICODONE) 5 MG immediate release tablet Take 1 tablet (5 mg total) by mouth every 4 (four) hours as needed for moderate pain. (Patient taking differently: Take 10 mg by mouth every 4 (four) hours as needed for moderate pain (pain score 4-6).) 12 tablet 0   polyethylene glycol (MIRALAX / GLYCOLAX) 17 g packet Take 17 g by mouth daily as needed for moderate constipation.     Psyllium (METAMUCIL SMOOTH TEXTURE PO) Take 1 packet by mouth 2 (two) times daily. Mix with 8 ounces of water     verapamil (CALAN-SR) 240 MG CR tablet Take 1 tablet (240 mg total) by mouth at bedtime. 90 tablet 3   ezetimibe (ZETIA) 10 MG tablet Take 1 tablet (10 mg total) by mouth daily. 90 tablet 3   furosemide (LASIX) 20 MG tablet Take 1 tablet (20 mg total) by mouth daily. 90 tablet 0   sodium zirconium cyclosilicate (LOKELMA) 10 g PACK packet Take 1 pack for 2 days 2 packet 0   No current facility-administered medications for this visit.    Social History   Socioeconomic History   Marital status: Married    Spouse name: CARROLL   Number of children: 0   Years of education: 16   Highest education level: Bachelor's degree (e.g., BA, AB, BS)  Occupational History   Occupation: Nursing-RN    RETIRED    Employer:   Tobacco Use   Smoking status: Former    Current packs/day: 0.00    Average packs/day: 0.8 packs/day for 42.0 years (31.5 ttl pk-yrs)    Types: Cigarettes    Start date: 08/09/1975    Quit date: 08/08/2017    Years since quitting: 5.8   Smokeless tobacco: Never  Vaping Use   Vaping status: Never Used  Substance and Sexual Activity   Alcohol use: Not Currently    Alcohol/week: 5.0 - 6.0 standard drinks of alcohol    Types: 5 - 6 Glasses of wine per week    Comment: no longer drinks-stopped approx 6 yrs ago   Drug  use: No   Sexual activity: Yes    Partners: Male    Birth control/protection: None    Comment: married  Other Topics Concern   Not on file  Social History Narrative   Not on file   Social Drivers of Health   Financial Resource Strain: Low Risk  (04/01/2021)   Received from [facility] visits prior to 06/27/2022., [facility] visits prior to 06/27/2022.   Overall Financial Resource Strain (CARDIA)    Difficulty of Paying Living Expenses: Not very hard  Food Insecurity: Low Risk  (01/26/2023)  [facility]   Hunger Vital Sign    Worried About Running Out of Food in the Last Year: Never true    Ran Out of Food in the Last Year: Never true  Transportation Needs: No Transportation Needs (01/26/2023)   Received from [facility]    In the past 12 months, has lack of reliable transportation kept you from medical appointments, meetings, work or from getting things needed for daily living? : No  Physical Activity: Sufficiently Active (04/01/2021)   Received from [facility] visits prior to 06/27/2022., [facility] visits prior to 06/27/2022.   Exercise Vital Sign    Days of Exercise per Week: 5 days    Minutes of Exercise per Session: 60 min  Stress: No Stress Concern Present (04/01/2021)   [facility] visits prior to 06/27/2022., [facility] visits prior to 06/27/2022.   [employer] of Occupational Health - Occupational Stress Questionnaire    Feeling of Stress : Only a little  Social Connections: Moderately Integrated (04/01/2021)   Received from [facility] visits prior to 06/27/2022., [facility] visits prior to 06/27/2022.   Social Advertising account executive [NHANES]    Frequency of Communication with Friends and Family: More than three times a week    Frequency of Social  Gatherings with Friends and Family: Patient refused    Attends Religious Services: Never    Database administrator or Organizations: Yes    Attends Banker Meetings: 1 to 4 times per year    Marital Status: Married  Catering manager Violence: Not At Risk (03/30/2022)   [facility] visits prior to 06/27/2022., [facility] visits prior to 06/27/2022.   Humiliation, Afraid, Rape, and Kick questionnaire    Fear of Current or Ex-Partner: No    Emotionally Abused: No    Physically Abused: No    Sexually Abused: No   Social history is notable in that [name] is married. [name] has one stepchild and 2 stepgrandchildren. [name] does walk.  Fortunately [name] quit tobacco on Sep 20, 2020  Family History  Problem Relation Age of Onset   COPD Father    Diabetes Mother    Heart disease Mother    Kidney disease Mother    Hypertension Mother     ROS [name]: Negative; No fevers, chills, or night sweats;  HEENT: Negative; No changes in vision or hearing, sinus congestion, difficulty swallowing Pulmonary: positive for mild COPD; No cough, wheezing, shortness of breath, hemoptysis Cardiovascular: See HPI GI: Positive for irritable bowel syndrome; history of rectal prolapse status post diverting colostomy and subsequent small bowel obstruction. GU: Negative; No dysuria, hematuria, or difficulty voiding Musculoskeletal: Status post left hip replacement surgery.;  Low back pain Hematologic/Oncology: Negative; no easy bruising, bleeding Endocrine: Negative; no heat/cold intolerance; no diabetes Neuro: Negative; no changes in balance, headaches Skin: Negative; No rashes or skin lesions Psychiatric: positive for mild depression for which [name] takes Zoloft.; No behavioral problems,  Sleep: Negative; No snoring, daytime sleepiness, hypersomnolence, bruxism, restless legs, hypnogognic hallucinations, no cataplexy Other comprehensive 14 point system  review is negative.   PE BP 104/62   Pulse (!) 53   Ht 5\' 7"  (1.702 m)   Wt 120 lb 12.8 oz (54.8 kg)   SpO2 96%   BMI 18.92 kg/m  Repeat  blood pressure by me was 130/70  Wt Readings from Last 3 Encounters:  06/21/23 120 lb 12.8 oz (54.8 kg)  03/18/23 118 lb 3.2 oz (53.6 kg)  03/05/22 112 lb 14.4 oz (51.2 kg)   [name]: Alert, oriented, no distress.  Skin: normal turgor, no rashes, warm and dry HEENT: Normocephalic, atraumatic. Pupils equal round and reactive to light; sclera anicteric; extraocular muscles intact; Fundi ** Nose without nasal septal hypertrophy Mouth/Parynx benign; Mallinpatti scale 2 Neck: No JVD, no carotid bruits; normal carotid upstroke Lungs: clear to ausculatation and percussion; no wheezing or rales Chest wall: without tenderness to palpitation Heart: PMI not displaced, RRR, s1 s2 normal, 1/6 systolic murmur, no diastolic murmur, no rubs, gallops, thrills, or heaves Abdomen: soft, nontender; no hepatosplenomehaly, BS+; abdominal aorta nontender and not dilated by palpation. Back: no CVA tenderness Pulses 2+ Musculoskeletal: full range of motion, normal strength, no joint deformities Extremities: Bilateral lower extremity edema slightly above the ankles and below.  No clubbing cyanosis, Homan's sign negative  Neurologic: grossly nonfocal; Cranial nerves grossly wnl Psychologic: Normal mood and affect   EKG Interpretation Date/Time:  Monday June 21 2023 12:13:15 EST Ventricular Rate:  53 PR Interval:  174 QRS Duration:  78 QT Interval:  420 QTC Calculation: 394 R Axis:   -64  Text Interpretation: Sinus bradycardia Left anterior fascicular block Anteroseptal infarct (cited on or before 18-Mar-2023) When compared with ECG of 18-Mar-2023 14:17, Questionable change in initial forces of Anterior leads Confirmed by Nicki Guadalajara ([id number]) on 06/21/2023 1:37:51 PM    March 18, 2023 ECG (independently read by me): NSR at 62, IRBBB  March 05, 2022  ECG (independently read by me): NSR at 86, Biatrial enlargement, LAD QS v1-2  March 25, 2021 ECG (independently read by me):  Sinus bradycardia at 59; QS V1-3  November 20, 2020 ECG (independently read by me):  Sinus bradycardia at 53; PR 200 msec, no ectopy  Sep 19, 2020 ECG (independently read by me): NSR at 61; LAD      October 2021 ECG (independently read by me): NSR at 79; QS V1-3; no ectopy  February 02, 2020 ECG (independently read by me): NSR at 71; Probable bistrial enlargement QS V1-26 Aug 2017 ECG (independently read by me): Sinus bradycardia 55 bpm.  QS complex V1 V2.  Normal intervals.  No ectopy.  May 2018 ECG (independently read by me): Sinus bradycardia 56 bpm.  QS V1, V2.  Mild RV conduction delay.  Normal intervals.  No ST segment changes.  November 2017 ECG (independently read by me): Sinus bradycardia 52 bpm.  QRS complex V1 V2.  Normal intervals.  June 2017 ECG (independently read by me): Normal sinus rhythm at 60 bpm.  First-degree AV block with a PR interval of 220 ms.  No significant ST changes.  Incomplete right bundle branch block.   November 2016 ECG (independently read by me): normal sinus rhythm with first-degree heart block with a PR interval at 270 ms.  QRS complex V1 V2.  November 2015 ECG (independently read by me.  (: Normal sinus rhythm at 64 bpm.  Borderline first degree AV block with a PR interval at 204 ms.  Mild RV conduction delay.  Anteroseptal Q waves, unchanged  October 2014ECG: Sinus rhythm with incomplete right bundle branch block. Anteroseptal Q waves unchanged.  LABS:     Latest Ref Rng & Units 06/21/2023    1:54 PM 04/06/2023   10:32 AM 03/31/2023   11:14 AM  BMP  Glucose 70 - 99 mg/dL 84  86  88   BUN 8 - 27 mg/dL 22  24  19    Creatinine 0.57 - 1.00 mg/dL 1.61  0.96  0.45   BUN/Creat Ratio 12 - 28 26  28  21    Sodium 134 - 144 mmol/L 144  143  145   Potassium 3.5 - 5.2 mmol/L 4.1  4.4  5.8   Chloride 96 - 106 mmol/L 102  103  106    CO2 20 - 29 mmol/L 20  27  20    Calcium 8.7 - 10.3 mg/dL 40.9  9.9  81.1       Latest Ref Rng & Units 06/21/2023    1:54 PM 03/31/2023   11:14 AM 03/11/2022    5:48 AM  Hepatic Function  Total Protein 6.0 - 8.5 g/dL 7.2  7.0  5.3   Albumin 3.9 - 4.9 g/dL 5.0  4.5  2.7   AST 0 - 40 IU/L 33  57  28   ALT 0 - 32 IU/L 31  35  14   Alk Phosphatase 44 - 121 IU/L 121  115  62   Total Bilirubin 0.0 - 1.2 mg/dL 0.8  0.9  0.8       Latest Ref Rng & Units 03/11/2022    5:48 AM 03/10/2022    4:59 AM 03/09/2022    3:15 PM  CBC  WBC 4.0 - 10.5 K/uL 3.8  4.6  6.3   Hemoglobin 12.0 - 15.0 g/dL 91.4  78.2  95.6   Hematocrit 36.0 - 46.0 % 36.4  39.1  43.7   Platelets 150 - 400 K/uL 269  327  383    Lab Results  Component Value Date   MCV 98.1 03/11/2022   MCV 99.0 03/10/2022   MCV 99.3 03/09/2022   Lab Results  Component Value Date   TSH 1.010 11/03/2016   Lipid Panel     Component Value Date/Time   CHOL 135 03/31/2023 1114   TRIG 94 03/31/2023 1114   HDL 57 03/31/2023 1114   CHOLHDL 2.4 03/31/2023 1114   CHOLHDL 3.3 02/28/2015 1023   VLDL 30 02/28/2015 1023   LDLCALC 60 03/31/2023 1114     RADIOLOGY: No results found.  IMPRESSION:  1. Agatston coronary artery calcium score between 200 and 399   2. History of PSVT (paroxysmal supraventricular tachycardia)   3. Leg swelling   4. Hyperkalemia: Resolved   5. Hyperlipidemia with target LDL less than 70   6. History of tobacco abuse: Quit Sep 20, 2020   7. Centrilobular emphysema (HCC)   8. S/P colostomy [facility])    ASSESSMENT AND PLAN: [name] is a 73 year-old female who has a history of SVT which had been fairly well-controlled over many years on verapamil 240 mg as well as atenolol 12.5 mg twice a day and remained stable without breakthrough SVT episodes.  [name] had a greater than 40-year history of tobacco use and fortunately quit tobacco on Sep 20, 2020.  Since that time, [name] breathing has significantly improved.  He has  been documented to have normal LV function and in August 2020 myocardial perfusion study showed normal perfusion without ischemia or infarction.  Documented to have centrilobular and paraseptal emphysema with underlying COPD.  He has documented multivessel coronary calcification and [name] last nuclear perfusion study in June 2022 showed apical thinning felt to be artifactual without scar or ischemia.  [name] underwent colostomy on December 16, 2021.  Unfortunately [name] developed a bowel obstruction leading to hospitalization from October 1 through February 03, 2022.  During that time apparently [name] medications were held and [name] developed recurrent tachydysrhythmia with heart rates up to 175 at [facility].  Presently, rhythm is stable as his blood pressure on current regimen.  Apparently, [name] had seen [name] primary physician in December.  Laboratory showed significant potassium elevation at 5.8 with mild transaminase elevation with AST at 57 and ALT at 35.  [name] was treated with Tylenol.  Lipid studies were excellent with LDL at 60.  LP(a) was normal at 31.4.  [name] has not had [name] labs rechecked.  I am recommending [name] undergo a follow-up comprehensive metabolic panel today.  [name] does have bilateral lower extremity edema.  I have suggested [name] initiate compression stockings with 20-30 pressure support.  [name] continues to be on atorvastatin 40 mg and Zetia 10 mg.  Most recent LDL cholesterol was 60 on March 31, 2023.  I discussed with [name] my plans for retirement later this year.  I will transition [name] to the care of Dr. Royann Shivers with plan evaluation in approximately 8 months or sooner as needed.    [name], MD, [facility]  06/23/2023 4:58 PM

## 2023-06-21 NOTE — Patient Instructions (Signed)
 Medication Instructions:  No medication changes were made during today's visit   Purchase compression stockings from your local medical equipment store 20-30 in size.   *If you need a refill on your cardiac medications before your next appointment, please call your pharmacy*   Lab Work: No labs were ordered during today's visit.  If you have labs (blood work) drawn today and your tests are completely normal, you will receive your results only by: MyChart Message (if you have MyChart) OR A paper copy in the mail If you have any lab test that is abnormal or we need to change your treatment, we will call you to review the results.   Testing/Procedures: No procedures ordered today.    Follow-Up: At [facility], you and your health needs are our priority.  As part of our continuing mission to provide you with exceptional heart care, we have created designated Provider Care Teams.  These Care Teams include your primary Cardiologist (physician) and Advanced Practice Providers (APPs -  Physician Assistants and Nurse Practitioners) who all work together to provide you with the care you need, when you need it.  We recommend signing up for the patient portal called "MyChart".  Sign up information is provided on this After Visit Summary.  MyChart is used to connect with patients for Virtual Visits (Telemedicine).  Patients are able to view lab/test results, encounter notes, upcoming appointments, etc.  Non-urgent messages can be sent to your provider as well.   To learn more about what you can do with MyChart, go to [email or web address].    Your next appointment:   8 month(s)  Provider:   Dr. Thurmon Fair     Other Instructions        1st Floor: - Lobby - Registration  - Pharmacy  - Lab - Cafe   2nd Floor: - PV Lab - Diagnostic Testing (echo, CT, nuclear med)   3rd Floor: - Vacant   4th Floor: - TCTS (cardiothoracic surgery) - AFib Clinic - Structural Heart  Clinic - Vascular Surgery  - Vascular Ultrasound   5th Floor: - [facility] (general and EP) - Clinical Pharmacy for coumadin, hypertension, lipid, weight-loss medications, and med management appointments      Valet parking services will be available as well.

## 2023-06-22 ENCOUNTER — Ambulatory Visit: Payer: Medicare Other | Admitting: Cardiovascular Disease

## 2023-06-22 LAB — COMPREHENSIVE METABOLIC PANEL
ALT: 31 [IU]/L (ref 0–32)
AST: 33 [IU]/L (ref 0–40)
Albumin: 5 g/dL — ABNORMAL HIGH (ref 3.9–4.9)
Alkaline Phosphatase: 121 [IU]/L (ref 44–121)
BUN/Creatinine Ratio: 26 (ref 12–28)
BUN: 22 mg/dL (ref 8–27)
Bilirubin Total: 0.8 mg/dL (ref 0.0–1.2)
CO2: 20 mmol/L (ref 20–29)
Calcium: 10.2 mg/dL (ref 8.7–10.3)
Chloride: 102 mmol/L (ref 96–106)
Creatinine, Ser: 0.84 mg/dL (ref 0.57–1.00)
Globulin, Total: 2.2 g/dL (ref 1.5–4.5)
Glucose: 84 mg/dL (ref 70–99)
Potassium: 4.1 mmol/L (ref 3.5–5.2)
Sodium: 144 mmol/L (ref 134–144)
Total Protein: 7.2 g/dL (ref 6.0–8.5)
eGFR: 75 mL/min/{1.73_m2} (ref >59)

## 2023-06-23 ENCOUNTER — Encounter: Payer: Self-pay | Admitting: Cardiovascular Disease

## 2023-07-15 ENCOUNTER — Other Ambulatory Visit: Payer: Self-pay | Admitting: Cardiovascular Disease

## 2023-08-04 ENCOUNTER — Telehealth: Payer: Self-pay

## 2023-08-04 NOTE — Telephone Encounter (Signed)
 Paper Work Dropped Off: Critical Condition Form   NEED ASAP  Date:08-04-2023   Location of paper:  In Dr Landry Dyke Mailbox at back of office   Patient was very worried that this form will not be filled out by Tresa Endo and get FAX"ed BEFORE 08-25-23.  She is danger of losing her insurance!!

## 2023-08-05 ENCOUNTER — Telehealth: Payer: Self-pay

## 2023-08-05 NOTE — Telephone Encounter (Signed)
 Spoke to this patient today, assured her that the form has been faxed. Fax confirmation received. Form is located at my desk for future reference.

## 2023-08-05 NOTE — Telephone Encounter (Signed)
 Received form on 08/05/23 from [facility]. Called patient to assure that form had been faxed in order for patient not to lose insurance benefits as specified from patient. Patient is aware that form has been faxed. Copy of form will be kept for future reference.

## 2023-12-13 ENCOUNTER — Telehealth: Payer: Self-pay | Admitting: Cardiovascular Disease

## 2023-12-13 NOTE — Telephone Encounter (Signed)
 Pt c/o of Chest Pain: STAT if active (IN THIS MOMENT) CP, including tightness, pressure, jaw pain, shoulder/upper arm/back pain, SOB, nausea, and vomiting.  1. Are you having CP right now (tightness, pressure, or discomfort)?  Denies pain. Having chest discomfort. No symptoms currently  2. Are you experiencing any other symptoms (ex. SOB, nausea, vomiting, sweating)?  Cold sweats  3. How long have you been experiencing CP?  About 1 month  4. Is your CP continuous or coming and going?  Coming and going  5. Have you taken Nitroglycerin?  Doesn't have any

## 2023-12-13 NOTE — Telephone Encounter (Signed)
 Spoke with pt, she reports pain in the left side of her chest. There is no SOB or other symptoms. She reports it occurs at rest, she never has it with exertion. She also reports breaking out in a sweat for no reason while reading. She reports it can last coupe minutes to 30 minutes. She reports it feels like her breast is hurting but she can not reproduce the pain when moving or touching her breast. She was offered an appointment tomorrow but it was too early in the morning. Follow up scheduled and patient voiced she will go to the ER with problems prior to appointment.

## 2024-01-03 ENCOUNTER — Ambulatory Visit (INDEPENDENT_AMBULATORY_CARE_PROVIDER_SITE_OTHER)
Admission: RE | Admit: 2024-01-03 | Discharge: 2024-01-03 | Disposition: A | Discharge status: Home / Self Care | Source: Ambulatory Visit | Attending: Acute Care | Admitting: Acute Care

## 2024-01-03 DIAGNOSIS — Z87891 Personal history of nicotine dependence: Secondary | ICD-10-CM

## 2024-01-03 DIAGNOSIS — Z122 Encounter for screening for malignant neoplasm of respiratory organs: Secondary | ICD-10-CM

## 2024-01-11 ENCOUNTER — Encounter: Payer: Self-pay | Admitting: Cardiovascular Disease

## 2024-01-11 ENCOUNTER — Ambulatory Visit: Attending: Cardiovascular Disease | Admitting: Cardiovascular Disease

## 2024-01-11 VITALS — BP 92/60 | HR 72 | Ht 68.0 in | Wt 118.4 lb

## 2024-01-11 DIAGNOSIS — Z8679 Personal history of other diseases of the circulatory system: Secondary | ICD-10-CM

## 2024-01-11 DIAGNOSIS — R079 Chest pain, unspecified: Secondary | ICD-10-CM

## 2024-01-11 DIAGNOSIS — E785 Hyperlipidemia, unspecified: Secondary | ICD-10-CM | POA: Diagnosis not present

## 2024-01-11 DIAGNOSIS — I700 Atherosclerosis of aorta: Secondary | ICD-10-CM

## 2024-01-11 DIAGNOSIS — R931 Abnormal findings on diagnostic imaging of heart and coronary circulation: Secondary | ICD-10-CM

## 2024-01-11 NOTE — Progress Notes (Unsigned)
 Cardiology Office Note:    Date:  01/13/2024   ID:  Ronal DELENA Sandifer, DOB 1950-05-30, MRN [id number]  PCP:  Thurmond Cathlyn DELENA., MD   [facility] Providers Cardiologist:  Jerel Balding, MD     Referring MD: Thurmond Cathlyn DELENA., MD   Chief Complaint  Patient presents with   Irregular Heart Beat  Transition of cardiology care from Dr. 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  She has done well since her last appointment and has not had any problems with palpitations.  She tends to run a relatively low blood pressure around 100/60.  Today her blood pressure is 92/60 and she is asymptomatic.  She has not had issues with dizziness or syncope.  She enjoys performing yard work.  She walks 2 miles a day every day of the week. Has occasional problems with chest discomfort in the left side that occurs at rest, not during activity.  He does not have any obvious precipitant triggers, but is always mild and resolves spontaneously.  Denies lower extremity edema or claudication.  She does not have cough or wheezing.  She smoked for about 40 years, but quit in 2019.  Coronary calcium  score performed at age 35 was 300 (86th percentile).  She had a low risk/normal perfusion nuclear stress test in 2022.  LVEF was normal.  Carotid duplex ultrasound has shown less than 40% stenosis bilaterally, most recently scan in 2020.  She reports a diagnosis of mitral valve prolapse, but the most recent echocardiogram from 2017 did not mention any abnormalities of the mitral valve.  Metabolic control is excellent with a recent LDL cholesterol of 60 and HDL of 57.  She has normal renal function and does not have diabetes mellitus.    Past Medical History:  Diagnosis Date   Anxiety    Back pain, chronic     Bowel obstruction (HCC)    Complication of anesthesia    PT HAS POST OP URINARY RETENTION:  per patient difficult stick- needs central line or picc  04-06-2017 AT [facility] IV STARTED BY CRNA NO ISSUE   COPD with emphysema (HCC)    DDD (degenerative disc disease), cervical    POST MULTIPLE SURGERY'S   DDD (degenerative disc disease), lumbosacral    POST MULTIPLE SURGERY'S   Difficult intravenous access    Fecal incontinence    once a week----  TREATED W/ SACRAL NERVE STIMULATOR PLACED 12/ 2018   Heart murmur    History of chronic bronchitis    History of spinal cord injury 02/09/2007   PT FELL-- S/P  CERVICAL SPINE SURGERY   History of urinary retention    PT HAS POST OP URINARY RETENTION DUE TO ANESTHESIA   History of venomous snake bite 2013   RIGHT MIDDLE FINGER BY COOPERHEAD--- TREATMENT W/ ANTIVENOM   Hyperlipemia    IBS (irritable bowel syndrome)    flairs up occasionally esp if stressed   Incomplete right bundle branch block    Major depression    Mild carotid artery disease [facility]) cardiologist-  dr sor   mild bilateral ICA per duplex 04-12-2017  1-39%   MVP (mitral valve prolapse)    no evidence stenosis or regurgitation per last echo 10-25-2015   OA (osteoarthritis)    PONV (postoperative nausea and vomiting)  Pre-diabetes    PSVT (paroxysmal supraventricular tachycardia) [facility])    cardiologist-- dr burnard   SBO (small bowel obstruction) (HCC) 01/08/2019   SVT (supraventricular tachycardia) (HCC) 02/27/2014    Past Surgical History:  Procedure Laterality Date   ABDOMINAL RECTOPEXY  2011       dr fuller  [facility])   prolapse rectum   ANAL RECTAL MANOMETRY N/A 03/01/2017   Procedure: ANO RECTAL MANOMETRY;  Surgeon: Debby Hila, MD;  Location: [facility];  Service: Endoscopy;  Laterality: N/A;   ANTERIOR CERVICAL DECOMP/DISCECTOMY FUSION  05-28-2000    dr lucilla   C3-4  &  C7-T1   ANTERIOR CERVICAL DECOMPRESSION LAMINECTOMY AND FUSION  02-09-2007    dr  mavis   decompression/ laminectomy C3-4 & C5-6;  laminectomy C2; fusion C2-5   BREAST SURGERY  1976   breast reduction   COLONOSCOPY     FORAMINOTOMY 1 LEVEL  11-03-1999   dr lucilla  [facility]   left C7-T1 w/ decompression left C8   KNEE SURGERY  AGE 75   bone spur     LUMBAR LAMINECTOMY/ DECOMPRESSION WITH MET-RX  02-09-2005  dr ether   L2-3 laminectomy left cage; fusion L2-4   POSTERIOR CERVICAL FUSION/FORAMINOTOMY  12-08-2000    dr lucilla   right forminotomy C7-T1 and posterior fusion   RIGHT THUMB COLLATERAL LIGAMENT RECONSTRUCTION  02-08-2003   dr camella   RIGHT THUMB LIGAMENT REPAIR, ARTHROTOMY, SYNOVECTOMY  10-26-2002    dr camella   SPINE SURGERY  1983 to 01/2007   total 14 surgery's  cervical and lumbar  (including fusion's, diskectomy, laminectomy's, foraminotomy's)   TONSILLECTOMY     TOTAL HIP ARTHROPLASTY Right 07/31/2014   Procedure: TOTAL HIP ARTHROPLASTY ANTERIOR APPROACH;  Surgeon: Evalene JONETTA Chancy, MD;  Location: [facility] OR;  Service: Orthopedics;  Laterality: Right;   TOTAL HIP ARTHROPLASTY Left 11/19/2015   Procedure: TOTAL HIP ARTHROPLASTY ANTERIOR APPROACH;  Surgeon: Evalene JONETTA Chancy, MD;  Location: [facility] OR;  Service: Orthopedics;  Laterality: Left;   TRANSTHORACIC ECHOCARDIOGRAM  10-25-2015   dr t. burnard   ef 60-655/  no evidence MV stenosis or regurgitation/     TUBAL LIGATION      Current Medications: Current Meds  Medication Sig   aspirin  EC 81 MG tablet Take 81 mg by mouth at bedtime.   atenolol  (TENORMIN ) 25 MG tablet TAKE 1/2 TABLET BY MOUTH TWICE DAILY   atorvastatin  (LIPITOR) 40 MG tablet TAKE 1 TABLET BY MOUTH DAILY   baclofen  (LIORESAL ) 20 MG tablet Take 20 mg by mouth See admin instructions. Take 1 tablet by mouth every 6 to 8 hours as needed for muscle spasms   Calcium  Carb-Cholecalciferol 600-10 MG-MCG TABS Take 1 tablet by mouth daily at 6 (six) AM.   Calcium  Carbonate-Vitamin D (CALCIUM  + D PO) Take 1 tablet by mouth 2 (two) times daily. Calcium  Magnesium Zinc  and Vitamin D   Cholecalciferol (VITAMIN D3) 125 MCG (5000 UT) TABS Take 5,000 Units by mouth daily at 12 noon.   clonazePAM  (KLONOPIN ) 1 MG tablet Take 1 mg by mouth at bedtime. For sleep   ezetimibe  (ZETIA ) 10 MG tablet Take 1 tablet (10 mg total) by mouth daily.   fish oil-omega-3 fatty acids 1000 MG capsule Take 1 g by mouth at bedtime.    furosemide  (LASIX ) 20 MG tablet TAKE 1 TABLET BY MOUTH DAILY.   Multiple Vitamin (MULTIVITAMIN WITH MINERALS) TABS tablet Take 1 tablet by mouth daily.   omeprazole (PRILOSEC) 20 MG capsule Take  20 mg by mouth daily.   oxyCODONE -acetaminophen  (PERCOCET) 10-325 MG tablet Take 1 tablet by mouth every 8 (eight) hours as needed for pain.   polyethylene glycol (MIRALAX / GLYCOLAX) 17 g packet Take 17 g by mouth daily as needed for moderate constipation.   Psyllium (METAMUCIL SMOOTH TEXTURE PO) Take 1 packet by mouth 2 (two) times daily. Mix with 8 ounces of water  (Patient taking differently: Take 1 packet by mouth daily at 6 (six) AM. Mix with 8 ounces of water )   verapamil  (CALAN -SR) 240 MG CR tablet Take 1 tablet (240 mg total) by mouth at bedtime.   Wheat Dextrin (BENEFIBER PO) Take 2 Scoops by mouth as needed.     Allergies:   Bupropion hcl, Celecoxib , Naproxen, Methadone, and Morphine    Social History   Socioeconomic History   Marital status: Married    Spouse name: CARROLL   Number of children: 0   Years of education: 16   Highest education level: Bachelor's degree (e.g., BA, AB, BS)  Occupational History   Occupation: Nursing-RN    RETIRED    Employer: [employer]  Tobacco Use   Smoking status: Former    Current packs/day: 0.00    Average packs/day: 0.8 packs/day for 42.0 years (31.5 ttl pk-yrs)    Types: Cigarettes    Start date: 08/09/1975    Quit date: 08/08/2017    Years since quitting: 6.4   Smokeless tobacco: Never  Vaping Use   Vaping status: Never Used  Substance and Sexual Activity   Alcohol use: Not Currently    Alcohol/week:  5.0 - 6.0 standard drinks of alcohol    Types: 5 - 6 Glasses of wine per week    Comment: no longer drinks-stopped approx 6 yrs ago   Drug use: No   Sexual activity: Yes    Partners: Male    Birth control/protection: None    Comment: married  Other Topics Concern   Not on file  Social History Narrative   Not on file   Social Drivers of Health   Financial Resource Strain: Low Risk  (04/01/2021)   Received from [facility] visits prior to 06/27/2022.   Overall Financial Resource Strain (CARDIA)    Difficulty of Paying Living Expenses: Not very hard  Food Insecurity: Low Risk  (01/26/2023)   [facility]   Hunger Vital Sign    Within the past 12 months, you worried that your food would run out before you got money to buy more: Never true    Within the past 12 months, the food you bought just didn't last and you didn't have money to get more. : Never true  Transportation Needs: No Transportation Needs (01/26/2023)   Received from [facility]    In the past 12 months, has lack of reliable transportation kept you from medical appointments, meetings, work or from getting things needed for daily living? : No  Physical Activity: Sufficiently Active (04/01/2021)   Received from [facility] visits prior to 06/27/2022.   Exercise Vital Sign    On average, how many days per week do you engage in moderate to strenuous exercise (like a brisk walk)?: 5 days    On average, how many minutes do you engage in exercise at this level?: 60 min  Stress: No Stress Concern Present (04/01/2021)   Received from [facility] visits prior to 06/27/2022.   [employer] of Occupational Health -  Occupational Stress Questionnaire    Feeling of Stress : Only a little  Social Connections: Moderately Integrated (04/01/2021)   Received from [facility] visits prior to 06/27/2022.   Social Connection and  Isolation Panel    In a typical week, how many times do you talk on the phone with family, friends, or neighbors?: More than three times a week    How often do you get together with friends or relatives?: Patient refused    How often do you attend church or religious services?: Never    Do you belong to any clubs or organizations such as church groups, unions, fraternal or athletic groups, or school groups?: Yes    How often do you attend meetings of the clubs or organizations you belong to?: 1 to 4 times per year    Are you married, widowed, divorced, separated, never married, or living with a partner?: Married     Family History: The patient's family history includes COPD in her father; Diabetes in her mother; Heart disease in her mother; Hypertension in her mother; Kidney disease in her mother.  ROS:   Please see the history of present illness.     All other systems reviewed and are negative.  EKGs/Labs/Other Studies Reviewed:    The following studies were reviewed today:  EKG Interpretation Date/Time:  Tuesday January 11 2024 14:39:33 EDT Ventricular Rate:  72 PR Interval:  190 QRS Duration:  90 QT Interval:  386 QTC Calculation: 422 R Axis:   -57  Text Interpretation: Normal sinus rhythm Possible Left atrial enlargement Left axis deviation RSR' or QR pattern in V1 suggests right ventricular conduction delay Possible Inferior infarct , age undetermined Possible Anteroseptal infarct (cited on or before 18-Mar-2023) When compared with ECG of 21-Jun-2023 12:13,  No significant change since last tracing  Confirmed by Jorgina Binning [phone]) on 01/11/2024 2:44:10 PM    Recent Labs: 06/21/2023: ALT 31; BUN 22; Creatinine, Ser 0.84; Potassium 4.1; Sodium 144  Recent Lipid Panel    Component Value Date/Time   CHOL 135 03/31/2023 1114   TRIG 94 03/31/2023 1114   HDL 57 03/31/2023 1114   CHOLHDL 2.4 03/31/2023 1114   CHOLHDL 3.3 02/28/2015 1023   VLDL 30 02/28/2015 1023    LDLCALC 60 03/31/2023 1114     Risk Assessment/Calculations:             Physical Exam:    VS:  BP 92/60 (BP Location: Left Arm, Patient Position: Sitting)   Pulse 72   Ht 5' 8 (1.727 m)   Wt 118 lb 6.4 oz (53.7 kg)   SpO2 98%   BMI 18.00 kg/m     Wt Readings from Last 3 Encounters:  01/11/24 118 lb 6.4 oz (53.7 kg)  06/21/23 120 lb 12.8 oz (54.8 kg)  03/18/23 118 lb 3.2 oz (53.6 kg)     GEN: Appears very lean and fit well nourished, well developed in no acute distress HEENT: Normal NECK: No JVD; No carotid bruits LYMPHATICS: No lymphadenopathy CARDIAC: RRR, no murmurs, rubs, gallops.  Cannot hear a systolic click or murmur even with provocative maneuvers. RESPIRATORY:  Clear to auscultation without rales, wheezing or rhonchi  ABDOMEN: Soft, non-tender, non-distended MUSCULOSKELETAL:  No edema; No deformity  SKIN: Warm and dry NEUROLOGIC:  Alert and oriented x 3 PSYCHIATRIC:  Normal affect   ASSESSMENT:    1. Agatston coronary artery calcium  score between 200 and 399   2. History of PSVT (paroxysmal supraventricular tachycardia)  3. Chest pain, unspecified type   4. Hyperlipidemia with target LDL less than 70   5. Atherosclerosis of aorta (HCC)    PLAN:    In order of problems listed above:  CAD: She has a markedly elevated coronary calcium  score, well over the average for her age, but she is asymptomatic and has a relatively recent normal nuclear stress test.  The focus remains on risk factor modification.  She is taking aspirin , lipid-lowering medications and beta-blockers. SVT: Has been asymptomatic for the last year on the current medications.  No changes recommended.  Her blood pressure is relatively low, but she seems to tolerate it without any complaints. HLP: Excellent lipid profile, continue atorvastatin . Aortic atherosclerosis: Incidentally noted on imaging studies.  Also has mild nonobstructive carotid atherosclerosis.           Medication  Adjustments/Labs and Tests Ordered: Current medicines are reviewed at length with the patient today.  Concerns regarding medicines are outlined above.  Orders Placed This Encounter  Procedures   EKG 12-Lead   No orders of the defined types were placed in this encounter.   Patient Instructions  Medication Instructions:  No changes *If you need a refill on your cardiac medications before your next appointment, please call your pharmacy*  Lab Work: None ordered If you have labs (blood work) drawn today and your tests are completely normal, you will receive your results only by: MyChart Message (if you have MyChart) OR A paper copy in the mail If you have any lab test that is abnormal or we need to change your treatment, we will call you to review the results.  Testing/Procedures: None ordered  Follow-Up: At [facility], you and your health needs are our priority.  As part of our continuing mission to provide you with exceptional heart care, our providers are all part of one team.  This team includes your primary Cardiologist (physician) and Advanced Practice Providers or APPs (Physician Assistants and Nurse Practitioners) who all work together to provide you with the care you need, when you need it.  Your next appointment:   1 year(s)  Provider:   Dr Francyne  We recommend signing up for the patient portal called MyChart.  Sign up information is provided on this After Visit Summary.  MyChart is used to connect with patients for Virtual Visits (Telemedicine).  Patients are able to view lab/test results, encounter notes, upcoming appointments, etc.  Non-urgent messages can be sent to your provider as well.   To learn more about what you can do with MyChart, go to [email or web address].      Signed, Jerel Francyne, MD  01/13/2024 12:30 PM    [facility]

## 2024-01-11 NOTE — Patient Instructions (Signed)

## 2024-01-13 ENCOUNTER — Encounter: Payer: Self-pay | Admitting: Cardiovascular Disease

## 2024-01-17 ENCOUNTER — Other Ambulatory Visit: Payer: Self-pay | Admitting: Acute Care

## 2024-01-17 DIAGNOSIS — Z122 Encounter for screening for malignant neoplasm of respiratory organs: Secondary | ICD-10-CM

## 2024-01-17 DIAGNOSIS — Z87891 Personal history of nicotine dependence: Secondary | ICD-10-CM

## 2024-05-09 ENCOUNTER — Other Ambulatory Visit: Payer: Self-pay

## 2024-05-09 MED ORDER — VERAPAMIL HCL ER 240 MG PO TBCR: 240.0000 mg | EXTENDED_RELEASE_TABLET | Freq: Every day | ORAL | 3 refills | Status: AC

## 2024-05-17 ENCOUNTER — Other Ambulatory Visit: Payer: Self-pay | Admitting: Cardiovascular Disease

## 2024-05-23 ENCOUNTER — Other Ambulatory Visit: Payer: Self-pay | Admitting: Cardiovascular Disease
